# Patient Record
Sex: Female | Born: 1945 | Race: White | Hispanic: No | Marital: Married | Smoking: Former smoker
Health system: Southern US, Community
[De-identification: ages and names within clinical notes are randomized; demographics above are authoritative.]

## PROBLEM LIST (undated history)

## (undated) DIAGNOSIS — F419 Anxiety disorder, unspecified: Secondary | ICD-10-CM

## (undated) DIAGNOSIS — J449 Chronic obstructive pulmonary disease, unspecified: Secondary | ICD-10-CM

## (undated) DIAGNOSIS — R0600 Dyspnea, unspecified: Secondary | ICD-10-CM

## (undated) DIAGNOSIS — I10 Essential (primary) hypertension: Secondary | ICD-10-CM

## (undated) DIAGNOSIS — M1990 Unspecified osteoarthritis, unspecified site: Secondary | ICD-10-CM

## (undated) DIAGNOSIS — Z87442 Personal history of urinary calculi: Secondary | ICD-10-CM

## (undated) DIAGNOSIS — I499 Cardiac arrhythmia, unspecified: Secondary | ICD-10-CM

## (undated) DIAGNOSIS — J45909 Unspecified asthma, uncomplicated: Secondary | ICD-10-CM

## (undated) DIAGNOSIS — Z9889 Other specified postprocedural states: Secondary | ICD-10-CM

## (undated) DIAGNOSIS — E785 Hyperlipidemia, unspecified: Secondary | ICD-10-CM

## (undated) DIAGNOSIS — J189 Pneumonia, unspecified organism: Secondary | ICD-10-CM

## (undated) DIAGNOSIS — R112 Nausea with vomiting, unspecified: Secondary | ICD-10-CM

## (undated) DIAGNOSIS — C3491 Malignant neoplasm of unspecified part of right bronchus or lung: Principal | ICD-10-CM

## (undated) DIAGNOSIS — G2581 Restless legs syndrome: Secondary | ICD-10-CM

## (undated) DIAGNOSIS — I82409 Acute embolism and thrombosis of unspecified deep veins of unspecified lower extremity: Secondary | ICD-10-CM

## (undated) HISTORY — PX: CARDIAC CATHETERIZATION: SHX172

## (undated) HISTORY — PX: CYST EXCISION: SHX5701

## (undated) HISTORY — PX: TONSILLECTOMY: SUR1361

## (undated) HISTORY — DX: Malignant neoplasm of unspecified part of right bronchus or lung: C34.91

## (undated) HISTORY — PX: DENTAL EXAMINATION UNDER ANESTHESIA: SHX1447

## (undated) HISTORY — PX: ABDOMINAL HYSTERECTOMY: SHX81

## (undated) HISTORY — PX: CYSTO: SHX6284

## (undated) HISTORY — PX: EYE SURGERY: SHX253

---

## 1999-07-22 ENCOUNTER — Emergency Department (HOSPITAL_COMMUNITY): Admission: EM | Admit: 1999-07-22 | Discharge: 1999-07-22 | Payer: Self-pay | Admitting: Emergency Medicine

## 1999-07-22 ENCOUNTER — Encounter: Payer: Self-pay | Admitting: Emergency Medicine

## 1999-07-24 ENCOUNTER — Inpatient Hospital Stay (HOSPITAL_COMMUNITY): Admission: RE | Admit: 1999-07-24 | Discharge: 1999-07-26 | Payer: Self-pay | Admitting: Urology

## 1999-07-24 ENCOUNTER — Encounter: Payer: Self-pay | Admitting: Urology

## 1999-07-25 ENCOUNTER — Encounter: Payer: Self-pay | Admitting: Urology

## 1999-08-01 ENCOUNTER — Encounter: Payer: Self-pay | Admitting: Urology

## 1999-08-01 ENCOUNTER — Ambulatory Visit (HOSPITAL_COMMUNITY): Admission: RE | Admit: 1999-08-01 | Discharge: 1999-08-01 | Payer: Self-pay | Admitting: Urology

## 2000-02-22 ENCOUNTER — Other Ambulatory Visit: Admission: RE | Admit: 2000-02-22 | Discharge: 2000-02-22 | Payer: Self-pay | Admitting: Obstetrics and Gynecology

## 2000-02-26 ENCOUNTER — Ambulatory Visit (HOSPITAL_COMMUNITY): Admission: RE | Admit: 2000-02-26 | Discharge: 2000-02-26 | Payer: Self-pay | Admitting: Gastroenterology

## 2000-06-10 ENCOUNTER — Ambulatory Visit (HOSPITAL_COMMUNITY): Admission: RE | Admit: 2000-06-10 | Discharge: 2000-06-10 | Payer: Self-pay

## 2001-02-25 ENCOUNTER — Other Ambulatory Visit: Admission: RE | Admit: 2001-02-25 | Discharge: 2001-02-25 | Payer: Self-pay | Admitting: Obstetrics and Gynecology

## 2002-02-25 ENCOUNTER — Other Ambulatory Visit: Admission: RE | Admit: 2002-02-25 | Discharge: 2002-02-25 | Payer: Self-pay | Admitting: Obstetrics and Gynecology

## 2003-04-06 ENCOUNTER — Other Ambulatory Visit: Admission: RE | Admit: 2003-04-06 | Discharge: 2003-04-06 | Payer: Self-pay | Admitting: Obstetrics and Gynecology

## 2005-01-08 ENCOUNTER — Observation Stay (HOSPITAL_COMMUNITY): Admission: EM | Admit: 2005-01-08 | Discharge: 2005-01-09 | Payer: Self-pay | Admitting: Emergency Medicine

## 2011-11-28 DIAGNOSIS — M545 Low back pain: Secondary | ICD-10-CM | POA: Diagnosis not present

## 2011-11-28 DIAGNOSIS — J45909 Unspecified asthma, uncomplicated: Secondary | ICD-10-CM | POA: Diagnosis not present

## 2011-11-28 DIAGNOSIS — IMO0001 Reserved for inherently not codable concepts without codable children: Secondary | ICD-10-CM | POA: Diagnosis not present

## 2011-11-28 DIAGNOSIS — E785 Hyperlipidemia, unspecified: Secondary | ICD-10-CM | POA: Diagnosis not present

## 2011-11-28 DIAGNOSIS — M1990 Unspecified osteoarthritis, unspecified site: Secondary | ICD-10-CM | POA: Diagnosis not present

## 2011-11-28 DIAGNOSIS — M999 Biomechanical lesion, unspecified: Secondary | ICD-10-CM | POA: Diagnosis not present

## 2011-11-28 DIAGNOSIS — I10 Essential (primary) hypertension: Secondary | ICD-10-CM | POA: Diagnosis not present

## 2011-11-29 DIAGNOSIS — M545 Low back pain: Secondary | ICD-10-CM | POA: Diagnosis not present

## 2011-11-29 DIAGNOSIS — M999 Biomechanical lesion, unspecified: Secondary | ICD-10-CM | POA: Diagnosis not present

## 2011-11-29 DIAGNOSIS — IMO0001 Reserved for inherently not codable concepts without codable children: Secondary | ICD-10-CM | POA: Diagnosis not present

## 2011-12-05 DIAGNOSIS — IMO0001 Reserved for inherently not codable concepts without codable children: Secondary | ICD-10-CM | POA: Diagnosis not present

## 2011-12-05 DIAGNOSIS — M545 Low back pain: Secondary | ICD-10-CM | POA: Diagnosis not present

## 2011-12-05 DIAGNOSIS — M999 Biomechanical lesion, unspecified: Secondary | ICD-10-CM | POA: Diagnosis not present

## 2011-12-17 DIAGNOSIS — M545 Low back pain: Secondary | ICD-10-CM | POA: Diagnosis not present

## 2011-12-17 DIAGNOSIS — IMO0001 Reserved for inherently not codable concepts without codable children: Secondary | ICD-10-CM | POA: Diagnosis not present

## 2011-12-17 DIAGNOSIS — M999 Biomechanical lesion, unspecified: Secondary | ICD-10-CM | POA: Diagnosis not present

## 2011-12-24 DIAGNOSIS — M545 Low back pain: Secondary | ICD-10-CM | POA: Diagnosis not present

## 2011-12-24 DIAGNOSIS — M999 Biomechanical lesion, unspecified: Secondary | ICD-10-CM | POA: Diagnosis not present

## 2011-12-24 DIAGNOSIS — IMO0001 Reserved for inherently not codable concepts without codable children: Secondary | ICD-10-CM | POA: Diagnosis not present

## 2011-12-31 DIAGNOSIS — M545 Low back pain: Secondary | ICD-10-CM | POA: Diagnosis not present

## 2011-12-31 DIAGNOSIS — IMO0001 Reserved for inherently not codable concepts without codable children: Secondary | ICD-10-CM | POA: Diagnosis not present

## 2011-12-31 DIAGNOSIS — M999 Biomechanical lesion, unspecified: Secondary | ICD-10-CM | POA: Diagnosis not present

## 2012-01-09 DIAGNOSIS — M545 Low back pain: Secondary | ICD-10-CM | POA: Diagnosis not present

## 2012-01-09 DIAGNOSIS — IMO0001 Reserved for inherently not codable concepts without codable children: Secondary | ICD-10-CM | POA: Diagnosis not present

## 2012-01-09 DIAGNOSIS — M999 Biomechanical lesion, unspecified: Secondary | ICD-10-CM | POA: Diagnosis not present

## 2012-01-22 DIAGNOSIS — IMO0001 Reserved for inherently not codable concepts without codable children: Secondary | ICD-10-CM | POA: Diagnosis not present

## 2012-01-22 DIAGNOSIS — M545 Low back pain: Secondary | ICD-10-CM | POA: Diagnosis not present

## 2012-01-22 DIAGNOSIS — M999 Biomechanical lesion, unspecified: Secondary | ICD-10-CM | POA: Diagnosis not present

## 2012-02-05 DIAGNOSIS — M999 Biomechanical lesion, unspecified: Secondary | ICD-10-CM | POA: Diagnosis not present

## 2012-02-05 DIAGNOSIS — IMO0001 Reserved for inherently not codable concepts without codable children: Secondary | ICD-10-CM | POA: Diagnosis not present

## 2012-02-05 DIAGNOSIS — M545 Low back pain: Secondary | ICD-10-CM | POA: Diagnosis not present

## 2012-02-19 DIAGNOSIS — IMO0001 Reserved for inherently not codable concepts without codable children: Secondary | ICD-10-CM | POA: Diagnosis not present

## 2012-02-19 DIAGNOSIS — M545 Low back pain: Secondary | ICD-10-CM | POA: Diagnosis not present

## 2012-02-19 DIAGNOSIS — M999 Biomechanical lesion, unspecified: Secondary | ICD-10-CM | POA: Diagnosis not present

## 2012-03-11 DIAGNOSIS — M545 Low back pain: Secondary | ICD-10-CM | POA: Diagnosis not present

## 2012-03-11 DIAGNOSIS — M999 Biomechanical lesion, unspecified: Secondary | ICD-10-CM | POA: Diagnosis not present

## 2012-03-11 DIAGNOSIS — IMO0001 Reserved for inherently not codable concepts without codable children: Secondary | ICD-10-CM | POA: Diagnosis not present

## 2012-03-25 DIAGNOSIS — IMO0001 Reserved for inherently not codable concepts without codable children: Secondary | ICD-10-CM | POA: Diagnosis not present

## 2012-03-25 DIAGNOSIS — M999 Biomechanical lesion, unspecified: Secondary | ICD-10-CM | POA: Diagnosis not present

## 2012-03-25 DIAGNOSIS — M545 Low back pain: Secondary | ICD-10-CM | POA: Diagnosis not present

## 2012-04-09 DIAGNOSIS — J45909 Unspecified asthma, uncomplicated: Secondary | ICD-10-CM | POA: Diagnosis not present

## 2012-04-09 DIAGNOSIS — J069 Acute upper respiratory infection, unspecified: Secondary | ICD-10-CM | POA: Diagnosis not present

## 2012-04-09 DIAGNOSIS — R05 Cough: Secondary | ICD-10-CM | POA: Diagnosis not present

## 2012-04-09 DIAGNOSIS — I10 Essential (primary) hypertension: Secondary | ICD-10-CM | POA: Diagnosis not present

## 2012-04-15 DIAGNOSIS — M999 Biomechanical lesion, unspecified: Secondary | ICD-10-CM | POA: Diagnosis not present

## 2012-04-15 DIAGNOSIS — M545 Low back pain: Secondary | ICD-10-CM | POA: Diagnosis not present

## 2012-04-15 DIAGNOSIS — IMO0001 Reserved for inherently not codable concepts without codable children: Secondary | ICD-10-CM | POA: Diagnosis not present

## 2012-05-06 DIAGNOSIS — IMO0001 Reserved for inherently not codable concepts without codable children: Secondary | ICD-10-CM | POA: Diagnosis not present

## 2012-05-06 DIAGNOSIS — M545 Low back pain: Secondary | ICD-10-CM | POA: Diagnosis not present

## 2012-05-06 DIAGNOSIS — M999 Biomechanical lesion, unspecified: Secondary | ICD-10-CM | POA: Diagnosis not present

## 2012-05-14 DIAGNOSIS — I10 Essential (primary) hypertension: Secondary | ICD-10-CM | POA: Diagnosis not present

## 2012-05-14 DIAGNOSIS — E785 Hyperlipidemia, unspecified: Secondary | ICD-10-CM | POA: Diagnosis not present

## 2012-05-21 DIAGNOSIS — E785 Hyperlipidemia, unspecified: Secondary | ICD-10-CM | POA: Diagnosis not present

## 2012-05-21 DIAGNOSIS — Z0000 Encounter for general adult medical examination without abnormal findings: Secondary | ICD-10-CM | POA: Diagnosis not present

## 2012-05-21 DIAGNOSIS — Z23 Encounter for immunization: Secondary | ICD-10-CM | POA: Diagnosis not present

## 2012-05-21 DIAGNOSIS — J45909 Unspecified asthma, uncomplicated: Secondary | ICD-10-CM | POA: Diagnosis not present

## 2012-05-21 DIAGNOSIS — I10 Essential (primary) hypertension: Secondary | ICD-10-CM | POA: Diagnosis not present

## 2012-05-22 DIAGNOSIS — Z1212 Encounter for screening for malignant neoplasm of rectum: Secondary | ICD-10-CM | POA: Diagnosis not present

## 2012-06-10 DIAGNOSIS — K5730 Diverticulosis of large intestine without perforation or abscess without bleeding: Secondary | ICD-10-CM | POA: Diagnosis not present

## 2012-06-10 DIAGNOSIS — Z86010 Personal history of colonic polyps: Secondary | ICD-10-CM | POA: Diagnosis not present

## 2012-06-10 DIAGNOSIS — K921 Melena: Secondary | ICD-10-CM | POA: Diagnosis not present

## 2012-07-03 DIAGNOSIS — Z23 Encounter for immunization: Secondary | ICD-10-CM | POA: Diagnosis not present

## 2012-07-16 DIAGNOSIS — Z1231 Encounter for screening mammogram for malignant neoplasm of breast: Secondary | ICD-10-CM | POA: Diagnosis not present

## 2012-07-28 DIAGNOSIS — M1610 Unilateral primary osteoarthritis, unspecified hip: Secondary | ICD-10-CM | POA: Diagnosis not present

## 2012-07-31 DIAGNOSIS — M1610 Unilateral primary osteoarthritis, unspecified hip: Secondary | ICD-10-CM | POA: Diagnosis not present

## 2012-08-29 DIAGNOSIS — I10 Essential (primary) hypertension: Secondary | ICD-10-CM | POA: Diagnosis not present

## 2012-08-29 DIAGNOSIS — L03019 Cellulitis of unspecified finger: Secondary | ICD-10-CM | POA: Diagnosis not present

## 2012-09-04 DIAGNOSIS — IMO0002 Reserved for concepts with insufficient information to code with codable children: Secondary | ICD-10-CM | POA: Diagnosis not present

## 2012-09-04 DIAGNOSIS — L608 Other nail disorders: Secondary | ICD-10-CM | POA: Diagnosis not present

## 2012-09-09 DIAGNOSIS — M169 Osteoarthritis of hip, unspecified: Secondary | ICD-10-CM | POA: Diagnosis not present

## 2012-09-09 DIAGNOSIS — M25559 Pain in unspecified hip: Secondary | ICD-10-CM | POA: Diagnosis not present

## 2012-09-30 DIAGNOSIS — K921 Melena: Secondary | ICD-10-CM | POA: Diagnosis not present

## 2012-09-30 DIAGNOSIS — K5730 Diverticulosis of large intestine without perforation or abscess without bleeding: Secondary | ICD-10-CM | POA: Diagnosis not present

## 2012-10-14 ENCOUNTER — Other Ambulatory Visit (HOSPITAL_COMMUNITY): Payer: Self-pay | Admitting: Orthopaedic Surgery

## 2012-10-22 ENCOUNTER — Encounter (HOSPITAL_COMMUNITY): Payer: Self-pay | Admitting: Pharmacy Technician

## 2012-10-27 ENCOUNTER — Encounter (HOSPITAL_COMMUNITY)
Admission: RE | Admit: 2012-10-27 | Discharge: 2012-10-27 | Disposition: A | Discharge status: Home / Self Care | Payer: Medicare Other | Source: Ambulatory Visit | Attending: Orthopaedic Surgery | Admitting: Orthopaedic Surgery

## 2012-10-27 ENCOUNTER — Encounter (HOSPITAL_COMMUNITY): Payer: Self-pay

## 2012-10-27 HISTORY — DX: Other specified postprocedural states: Z98.890

## 2012-10-27 HISTORY — DX: Unspecified asthma, uncomplicated: J45.909

## 2012-10-27 HISTORY — DX: Unspecified osteoarthritis, unspecified site: M19.90

## 2012-10-27 HISTORY — DX: Hyperlipidemia, unspecified: E78.5

## 2012-10-27 HISTORY — DX: Essential (primary) hypertension: I10

## 2012-10-27 HISTORY — DX: Nausea with vomiting, unspecified: R11.2

## 2012-10-27 LAB — URINALYSIS, ROUTINE W REFLEX MICROSCOPIC
Bilirubin Urine: NEGATIVE
Glucose, UA: NEGATIVE mg/dL
Leukocytes, UA: NEGATIVE
Protein, ur: NEGATIVE mg/dL
pH: 7 (ref 5.0–8.0)

## 2012-10-27 LAB — CBC
MCV: 89.9 fL (ref 78.0–100.0)
Platelets: 264 10*3/uL (ref 150–400)
RDW: 13.1 % (ref 11.5–15.5)
WBC: 8.1 10*3/uL (ref 4.0–10.5)

## 2012-10-27 LAB — BASIC METABOLIC PANEL
BUN: 18 mg/dL (ref 6–23)
Calcium: 9.9 mg/dL (ref 8.4–10.5)
Creatinine, Ser: 0.85 mg/dL (ref 0.50–1.10)
GFR calc Af Amer: 80 mL/min — ABNORMAL LOW (ref >90)
GFR calc non Af Amer: 69 mL/min — ABNORMAL LOW (ref >90)
Glucose, Bld: 90 mg/dL (ref 70–99)

## 2012-10-27 LAB — PROTIME-INR: Prothrombin Time: 12.6 seconds (ref 11.6–15.2)

## 2012-10-27 LAB — SURGICAL PCR SCREEN: MRSA, PCR: NEGATIVE

## 2012-10-27 NOTE — Patient Instructions (Addendum)
JAONNA WORD  10/27/2012                           YOUR PROCEDURE IS SCHEDULED ON:  10/31/12               PLEASE REPORT TO SHORT STAY CENTER AT :  7:30 AM               CALL THIS NUMBER IF ANY PROBLEMS THE DAY OF SURGERY :               [phone]                      REMEMBER:   Do not eat food or drink liquids AFTER MIDNIGHT   Take these medicines the morning of surgery with A SIP OF WATER:  SIMVASTATIN / DILTIAZEM   Do not wear jewelry, make-up   Do not wear lotions, powders, or perfumes.   Do not shave legs or underarms 12 hrs. before surgery (men may shave face)  Do not bring valuables to the hospital.  Contacts, dentures or bridgework may not be worn into surgery.  Leave suitcase in the car. After surgery it may be brought to your room.  For patients admitted to the hospital more than one night, checkout time is 11:00                          The day of discharge.   Patients discharged the day of surgery will not be allowed to drive home                             If going home same day of surgery, must have someone stay with you first                           24 hrs at home and arrange for some one to drive you home from hospital.    Special Instructions:   Please read over the following fact sheets that you were given:               1. MRSA  INFORMATION                      2. [facility] PREPARING FOR SURGERY SHEET                                                X_____________________________________________________________________        Failure to follow these instructions may result in cancellation of your surgery

## 2012-10-27 NOTE — Progress Notes (Signed)
PTT faxed to Dr. Maureen Ralphs - confirmation recieved

## 2012-10-31 ENCOUNTER — Inpatient Hospital Stay (HOSPITAL_COMMUNITY): Payer: Medicare Other

## 2012-10-31 ENCOUNTER — Encounter (HOSPITAL_COMMUNITY): Payer: Self-pay | Admitting: *Deleted

## 2012-10-31 ENCOUNTER — Encounter (HOSPITAL_COMMUNITY): Payer: Self-pay | Admitting: Anesthesiology

## 2012-10-31 ENCOUNTER — Encounter (HOSPITAL_COMMUNITY)
Admission: RE | Disposition: A | Discharge status: Home Health | Payer: Self-pay | Source: Ambulatory Visit | Attending: Orthopaedic Surgery

## 2012-10-31 ENCOUNTER — Inpatient Hospital Stay (HOSPITAL_COMMUNITY)
Admission: RE | Admit: 2012-10-31 | Discharge: 2012-11-03 | DRG: 470 | Disposition: A | Discharge status: Home Health | Payer: Medicare Other | Source: Ambulatory Visit | Attending: Orthopaedic Surgery | Admitting: Orthopaedic Surgery

## 2012-10-31 ENCOUNTER — Inpatient Hospital Stay (HOSPITAL_COMMUNITY): Payer: Medicare Other | Admitting: Anesthesiology

## 2012-10-31 DIAGNOSIS — Z471 Aftercare following joint replacement surgery: Secondary | ICD-10-CM | POA: Diagnosis not present

## 2012-10-31 DIAGNOSIS — I10 Essential (primary) hypertension: Secondary | ICD-10-CM | POA: Diagnosis not present

## 2012-10-31 DIAGNOSIS — Z79899 Other long term (current) drug therapy: Secondary | ICD-10-CM

## 2012-10-31 DIAGNOSIS — E876 Hypokalemia: Secondary | ICD-10-CM | POA: Diagnosis not present

## 2012-10-31 DIAGNOSIS — M1610 Unilateral primary osteoarthritis, unspecified hip: Secondary | ICD-10-CM | POA: Diagnosis not present

## 2012-10-31 DIAGNOSIS — M169 Osteoarthritis of hip, unspecified: Secondary | ICD-10-CM | POA: Diagnosis present

## 2012-10-31 DIAGNOSIS — M25559 Pain in unspecified hip: Secondary | ICD-10-CM | POA: Diagnosis not present

## 2012-10-31 DIAGNOSIS — Z96649 Presence of unspecified artificial hip joint: Secondary | ICD-10-CM | POA: Diagnosis not present

## 2012-10-31 DIAGNOSIS — E785 Hyperlipidemia, unspecified: Secondary | ICD-10-CM | POA: Diagnosis not present

## 2012-10-31 DIAGNOSIS — F17200 Nicotine dependence, unspecified, uncomplicated: Secondary | ICD-10-CM | POA: Diagnosis present

## 2012-10-31 DIAGNOSIS — J45909 Unspecified asthma, uncomplicated: Secondary | ICD-10-CM | POA: Diagnosis present

## 2012-10-31 DIAGNOSIS — Z01812 Encounter for preprocedural laboratory examination: Secondary | ICD-10-CM

## 2012-10-31 HISTORY — PX: TOTAL HIP ARTHROPLASTY: SHX124

## 2012-10-31 LAB — TYPE AND SCREEN
ABO/RH(D): A POS
Antibody Screen: NEGATIVE

## 2012-10-31 SURGERY — ARTHROPLASTY, HIP, TOTAL, ANTERIOR APPROACH
Anesthesia: General | Site: Hip | Laterality: Right | Wound class: Clean

## 2012-10-31 MED ORDER — SODIUM CHLORIDE 0.9 % IV SOLN
INTRAVENOUS | Status: DC | PRN
  Administered 2012-10-31: 1000 mL via INTRAMUSCULAR

## 2012-10-31 MED ORDER — MENTHOL 3 MG MT LOZG
1.0000 | LOZENGE | OROMUCOSAL | Status: DC | PRN
  Filled 2012-10-31: qty 9

## 2012-10-31 MED ORDER — CEFAZOLIN SODIUM-DEXTROSE 2-3 GM-% IV SOLR
2.0000 g | INTRAVENOUS | Status: AC
  Administered 2012-10-31: 2 g via INTRAVENOUS

## 2012-10-31 MED ORDER — METHOCARBAMOL 100 MG/ML IJ SOLN
500.0000 mg | Freq: Four times a day (QID) | INTRAVENOUS | Status: DC | PRN
  Administered 2012-10-31: 500 mg via INTRAVENOUS
  Filled 2012-10-31: qty 5

## 2012-10-31 MED ORDER — FLUTICASONE PROPIONATE 50 MCG/ACT NA SUSP
2.0000 | Freq: Every day | NASAL | Status: DC | PRN
  Filled 2012-10-31: qty 16

## 2012-10-31 MED ORDER — STERILE WATER FOR IRRIGATION IR SOLN
Status: DC | PRN
  Administered 2012-10-31: 3000 mL

## 2012-10-31 MED ORDER — ALUM & MAG HYDROXIDE-SIMETH 200-200-20 MG/5ML PO SUSP: 30.0000 mL | ORAL | Status: DC | PRN

## 2012-10-31 MED ORDER — ROCURONIUM BROMIDE 100 MG/10ML IV SOLN
INTRAVENOUS | Status: DC | PRN
  Administered 2012-10-31: 35 mg via INTRAVENOUS

## 2012-10-31 MED ORDER — ZOLPIDEM TARTRATE 5 MG PO TABS: 5.0000 mg | ORAL_TABLET | Freq: Every evening | ORAL | Status: DC | PRN

## 2012-10-31 MED ORDER — KETOROLAC TROMETHAMINE 15 MG/ML IJ SOLN
7.5000 mg | Freq: Four times a day (QID) | INTRAMUSCULAR | Status: AC
  Administered 2012-10-31 (×2): 7.5 mg via INTRAVENOUS
  Filled 2012-10-31 (×2): qty 1

## 2012-10-31 MED ORDER — DILTIAZEM HCL ER BEADS 300 MG PO CP24
300.0000 mg | ORAL_CAPSULE | Freq: Every day | ORAL | Status: DC
  Administered 2012-11-02: 300 mg via ORAL
  Filled 2012-10-31 (×3): qty 1

## 2012-10-31 MED ORDER — ASPIRIN EC 325 MG PO TBEC
325.0000 mg | DELAYED_RELEASE_TABLET | Freq: Two times a day (BID) | ORAL | Status: DC
  Administered 2012-11-01 – 2012-11-03 (×5): 325 mg via ORAL
  Filled 2012-10-31 (×8): qty 1

## 2012-10-31 MED ORDER — ONDANSETRON HCL 4 MG/2ML IJ SOLN
INTRAMUSCULAR | Status: DC | PRN
  Administered 2012-10-31: 4 mg via INTRAVENOUS

## 2012-10-31 MED ORDER — ONDANSETRON HCL 4 MG/2ML IJ SOLN: 4.0000 mg | Freq: Four times a day (QID) | INTRAMUSCULAR | Status: DC | PRN

## 2012-10-31 MED ORDER — SIMVASTATIN 20 MG PO TABS
20.0000 mg | ORAL_TABLET | Freq: Every day | ORAL | Status: DC
  Administered 2012-11-01 – 2012-11-03 (×3): 20 mg via ORAL
  Filled 2012-10-31 (×3): qty 1

## 2012-10-31 MED ORDER — HYDROMORPHONE HCL PF 1 MG/ML IJ SOLN
1.0000 mg | INTRAMUSCULAR | Status: DC | PRN
  Administered 2012-11-01: 1 mg via INTRAVENOUS
  Filled 2012-10-31: qty 1

## 2012-10-31 MED ORDER — GLYCOPYRROLATE 0.2 MG/ML IJ SOLN
INTRAMUSCULAR | Status: DC | PRN
  Administered 2012-10-31: .3 mg via INTRAVENOUS

## 2012-10-31 MED ORDER — CEFAZOLIN SODIUM 1-5 GM-% IV SOLN
1.0000 g | Freq: Four times a day (QID) | INTRAVENOUS | Status: AC
  Administered 2012-10-31 (×2): 1 g via INTRAVENOUS
  Filled 2012-10-31 (×2): qty 50

## 2012-10-31 MED ORDER — METOCLOPRAMIDE HCL 10 MG PO TABS: 5.0000 mg | ORAL_TABLET | Freq: Three times a day (TID) | ORAL | Status: DC | PRN

## 2012-10-31 MED ORDER — EPHEDRINE SULFATE 50 MG/ML IJ SOLN
INTRAMUSCULAR | Status: DC | PRN
  Administered 2012-10-31: 10 mg via INTRAVENOUS

## 2012-10-31 MED ORDER — LIDOCAINE HCL (CARDIAC) 20 MG/ML IV SOLN
INTRAVENOUS | Status: DC | PRN
  Administered 2012-10-31: 60 mg via INTRAVENOUS

## 2012-10-31 MED ORDER — ONDANSETRON HCL 4 MG PO TABS: 4.0000 mg | ORAL_TABLET | Freq: Four times a day (QID) | ORAL | Status: DC | PRN

## 2012-10-31 MED ORDER — PROPOFOL 10 MG/ML IV BOLUS
INTRAVENOUS | Status: DC | PRN
  Administered 2012-10-31: 110 mg via INTRAVENOUS

## 2012-10-31 MED ORDER — MEPERIDINE HCL 50 MG/ML IJ SOLN: 6.2500 mg | INTRAMUSCULAR | Status: DC | PRN

## 2012-10-31 MED ORDER — METHOCARBAMOL 500 MG PO TABS
500.0000 mg | ORAL_TABLET | Freq: Four times a day (QID) | ORAL | Status: DC | PRN
  Administered 2012-11-02: 500 mg via ORAL
  Filled 2012-10-31: qty 1

## 2012-10-31 MED ORDER — OXYCODONE HCL 5 MG PO TABS
5.0000 mg | ORAL_TABLET | ORAL | Status: DC | PRN
  Administered 2012-11-01 – 2012-11-02 (×3): 10 mg via ORAL
  Filled 2012-10-31 (×4): qty 2

## 2012-10-31 MED ORDER — OXYCODONE HCL ER 10 MG PO T12A
10.0000 mg | EXTENDED_RELEASE_TABLET | Freq: Two times a day (BID) | ORAL | Status: DC
  Administered 2012-10-31 – 2012-11-02 (×6): 10 mg via ORAL
  Filled 2012-10-31 (×6): qty 1

## 2012-10-31 MED ORDER — ACETAMINOPHEN 325 MG PO TABS
650.0000 mg | ORAL_TABLET | Freq: Four times a day (QID) | ORAL | Status: DC | PRN
  Administered 2012-11-02 – 2012-11-03 (×2): 650 mg via ORAL
  Filled 2012-10-31: qty 2

## 2012-10-31 MED ORDER — 0.9 % SODIUM CHLORIDE (POUR BTL) OPTIME
TOPICAL | Status: DC | PRN
  Administered 2012-10-31: 1000 mL

## 2012-10-31 MED ORDER — IRBESARTAN 150 MG PO TABS
150.0000 mg | ORAL_TABLET | Freq: Every day | ORAL | Status: DC
  Administered 2012-10-31 – 2012-11-02 (×2): 150 mg via ORAL
  Filled 2012-10-31 (×4): qty 1

## 2012-10-31 MED ORDER — DOCUSATE SODIUM 100 MG PO CAPS
100.0000 mg | ORAL_CAPSULE | Freq: Two times a day (BID) | ORAL | Status: DC
  Administered 2012-10-31 – 2012-11-03 (×7): 100 mg via ORAL

## 2012-10-31 MED ORDER — HYDROMORPHONE HCL PF 1 MG/ML IJ SOLN
0.2500 mg | INTRAMUSCULAR | Status: DC | PRN
  Administered 2012-10-31: 0.5 mg via INTRAVENOUS

## 2012-10-31 MED ORDER — ACETAMINOPHEN 650 MG RE SUPP: 650.0000 mg | Freq: Four times a day (QID) | RECTAL | Status: DC | PRN

## 2012-10-31 MED ORDER — SODIUM CHLORIDE 0.9 % IV SOLN
INTRAVENOUS | Status: DC
  Administered 2012-10-31 – 2012-11-01 (×2): via INTRAVENOUS

## 2012-10-31 MED ORDER — SCOPOLAMINE 1 MG/3DAYS TD PT72
1.0000 | MEDICATED_PATCH | TRANSDERMAL | Status: DC
  Administered 2012-10-31: 1.5 mg via TRANSDERMAL
  Filled 2012-10-31: qty 1

## 2012-10-31 MED ORDER — PROMETHAZINE HCL 25 MG/ML IJ SOLN: 6.2500 mg | INTRAMUSCULAR | Status: DC | PRN

## 2012-10-31 MED ORDER — LACTATED RINGERS IV SOLN
INTRAVENOUS | Status: DC
  Administered 2012-10-31: 1000 mL via INTRAVENOUS

## 2012-10-31 MED ORDER — ALBUTEROL SULFATE HFA 108 (90 BASE) MCG/ACT IN AERS: 2.0000 | INHALATION_SPRAY | Freq: Four times a day (QID) | RESPIRATORY_TRACT | Status: DC | PRN

## 2012-10-31 MED ORDER — TRIAMTERENE-HCTZ 37.5-25 MG PO TABS
1.0000 | ORAL_TABLET | Freq: Every day | ORAL | Status: DC
  Administered 2012-10-31 – 2012-11-02 (×2): 1 via ORAL
  Filled 2012-10-31 (×4): qty 1

## 2012-10-31 MED ORDER — DIPHENHYDRAMINE HCL 12.5 MG/5ML PO ELIX: 12.5000 mg | ORAL_SOLUTION | ORAL | Status: DC | PRN

## 2012-10-31 MED ORDER — OXYCODONE HCL 5 MG/5ML PO SOLN: 5.0000 mg | Freq: Once | ORAL | Status: DC | PRN

## 2012-10-31 MED ORDER — FENTANYL CITRATE 0.05 MG/ML IJ SOLN
INTRAMUSCULAR | Status: DC | PRN
  Administered 2012-10-31 (×2): 50 ug via INTRAVENOUS
  Administered 2012-10-31: 100 ug via INTRAVENOUS
  Administered 2012-10-31: 50 ug via INTRAVENOUS

## 2012-10-31 MED ORDER — OXYCODONE HCL 5 MG PO TABS: 5.0000 mg | ORAL_TABLET | Freq: Once | ORAL | Status: DC | PRN

## 2012-10-31 MED ORDER — FERROUS SULFATE 325 (65 FE) MG PO TABS
325.0000 mg | ORAL_TABLET | Freq: Three times a day (TID) | ORAL | Status: DC
  Administered 2012-10-31 – 2012-11-03 (×8): 325 mg via ORAL
  Filled 2012-10-31 (×12): qty 1

## 2012-10-31 MED ORDER — MIDAZOLAM HCL 5 MG/5ML IJ SOLN
INTRAMUSCULAR | Status: DC | PRN
  Administered 2012-10-31: 1 mg via INTRAVENOUS

## 2012-10-31 MED ORDER — ACETAMINOPHEN 10 MG/ML IV SOLN: 1000.0000 mg | Freq: Once | INTRAVENOUS | Status: DC | PRN

## 2012-10-31 MED ORDER — METOCLOPRAMIDE HCL 5 MG/ML IJ SOLN: 5.0000 mg | Freq: Three times a day (TID) | INTRAMUSCULAR | Status: DC | PRN

## 2012-10-31 MED ORDER — PHENOL 1.4 % MT LIQD: 1.0000 | OROMUCOSAL | Status: DC | PRN

## 2012-10-31 MED ORDER — NEOSTIGMINE METHYLSULFATE 1 MG/ML IJ SOLN
INTRAMUSCULAR | Status: DC | PRN
  Administered 2012-10-31: 2 mg via INTRAVENOUS

## 2012-10-31 SURGICAL SUPPLY — 40 items
ADH SKN CLS APL DERMABOND .7 (GAUZE/BANDAGES/DRESSINGS) ×1
BAG SPEC THK2 15X12 ZIP CLS (MISCELLANEOUS) ×2
BAG ZIPLOCK 12X15 (MISCELLANEOUS) ×4 IMPLANT
BLADE SAW SGTL 18X1.27X75 (BLADE) ×2 IMPLANT
CELLS DAT CNTRL 66122 CELL SVR (MISCELLANEOUS) ×1 IMPLANT
CLOTH BEACON ORANGE TIMEOUT ST (SAFETY) ×2 IMPLANT
DERMABOND ADVANCED (GAUZE/BANDAGES/DRESSINGS) ×1
DERMABOND ADVANCED .7 DNX12 (GAUZE/BANDAGES/DRESSINGS) ×1 IMPLANT
DRAPE C-ARM 42X72 X-RAY (DRAPES) ×2 IMPLANT
DRAPE STERI IOBAN 125X83 (DRAPES) ×2 IMPLANT
DRAPE U-SHAPE 47X51 STRL (DRAPES) ×6 IMPLANT
DRSG AQUACEL AG ADV 3.5X10 (GAUZE/BANDAGES/DRESSINGS) ×2 IMPLANT
DURAPREP 26ML APPLICATOR (WOUND CARE) ×2 IMPLANT
ELECT BLADE TIP CTD 4 INCH (ELECTRODE) ×2 IMPLANT
ELECT REM PT RETURN 9FT ADLT (ELECTROSURGICAL) ×2
ELECTRODE REM PT RTRN 9FT ADLT (ELECTROSURGICAL) ×1 IMPLANT
FACESHIELD LNG OPTICON STERILE (SAFETY) ×8 IMPLANT
GLOVE BIO SURGEON STRL SZ7 (GLOVE) ×2 IMPLANT
GLOVE BIO SURGEON STRL SZ7.5 (GLOVE) ×2 IMPLANT
GLOVE BIOGEL PI IND STRL 7.5 (GLOVE) IMPLANT
GLOVE BIOGEL PI IND STRL 8 (GLOVE) ×1 IMPLANT
GLOVE BIOGEL PI INDICATOR 7.5 (GLOVE)
GLOVE BIOGEL PI INDICATOR 8 (GLOVE) ×1
GLOVE ECLIPSE 7.0 STRL STRAW (GLOVE) ×2 IMPLANT
GOWN STRL REIN XL XLG (GOWN DISPOSABLE) ×4 IMPLANT
HANDPIECE INTERPULSE COAX TIP (DISPOSABLE) ×2
KIT BASIN OR (CUSTOM PROCEDURE TRAY) ×2 IMPLANT
PACK TOTAL JOINT (CUSTOM PROCEDURE TRAY) ×2 IMPLANT
PADDING CAST COTTON 6X4 STRL (CAST SUPPLIES) ×2 IMPLANT
RTRCTR WOUND ALEXIS 18CM MED (MISCELLANEOUS) ×2
SET HNDPC FAN SPRY TIP SCT (DISPOSABLE) ×1 IMPLANT
SUT ETHIBOND NAB CT1 #1 30IN (SUTURE) ×4 IMPLANT
SUT MNCRL AB 4-0 PS2 18 (SUTURE) ×2 IMPLANT
SUT VIC AB 1 CT1 36 (SUTURE) ×4 IMPLANT
SUT VIC AB 2-0 CT1 27 (SUTURE) ×4
SUT VIC AB 2-0 CT1 TAPERPNT 27 (SUTURE) ×2 IMPLANT
SUT VLOC 180 0 24IN GS25 (SUTURE) ×2 IMPLANT
TOWEL OR 17X26 10 PK STRL BLUE (TOWEL DISPOSABLE) ×4 IMPLANT
TOWEL OR NON WOVEN STRL DISP B (DISPOSABLE) ×2 IMPLANT
TRAY FOLEY CATH 14FRSI W/METER (CATHETERS) ×2 IMPLANT

## 2012-10-31 NOTE — Brief Op Note (Signed)
10/31/2012  11:40 AM  PATIENT:  [name]  67 y.o. female  PRE-OPERATIVE DIAGNOSIS:  Severe osteoarthritis right hip  POST-OPERATIVE DIAGNOSIS:  Severe osteoarthritis right hi  PROCEDURE:  Procedure(s) (LRB) with comments: TOTAL HIP ARTHROPLASTY ANTERIOR APPROACH (Right) - Right Total Hip Replacement  SURGEON:  Surgeon(s) and Role:    * Kathryne Hitch, MD - Primary  PHYSICIAN ASSISTANT:   ASSISTANTS: none   ANESTHESIA:   general  EBL:  Total I/O In: 1400 [I.V.:1400] Out: 475 [Urine:225; Blood:250]  BLOOD ADMINISTERED:none  DRAINS: none   LOCAL MEDICATIONS USED:  NONE  SPECIMEN:  No Specimen  DISPOSITION OF SPECIMEN:  N/A  COUNTS:  YES  TOURNIQUET:  * No tourniquets in log *  DICTATION: .Other Dictation: Dictation Number [id number]  PLAN OF CARE: Admit to inpatient   PATIENT DISPOSITION:  PACU - hemodynamically stable.   Delay start of Pharmacological VTE agent (>24hrs) due to surgical blood loss or risk of bleeding: no

## 2012-10-31 NOTE — Transfer of Care (Signed)
Immediate Anesthesia Transfer of Care Note  Patient: [name]  Procedure(s) Performed: Procedure(s) (LRB) with comments: TOTAL HIP ARTHROPLASTY ANTERIOR APPROACH (Right) - Right Total Hip Replacement  Patient Location: PACU  Anesthesia Type:General  Level of Consciousness: awake, alert  and oriented  Airway & Oxygen Therapy: Patient Spontanous Breathing and Patient connected to face mask oxygen  Post-op Assessment: Report given to PACU RN and Post -op Vital signs reviewed and stable  Post vital signs: Reviewed and stable  Complications: No apparent anesthesia complications

## 2012-10-31 NOTE — H&P (Signed)
TOTAL HIP ADMISSION H&P  Patient is admitted for right total hip arthroplasty.  Subjective:  Chief Complaint: right hip pain  HPI: [name], 67 y.o. female, has a history of pain and functional disability in the right hip(s) due to arthritis and patient has failed non-surgical conservative treatments for greater than 12 weeks to include NSAID's and/or analgesics, use of assistive devices, weight reduction as appropriate and activity modification.  Onset of symptoms was gradual starting 2 years ago with gradually worsening course since that time.The patient noted no past surgery on the right hip(s).  Patient currently rates pain in the right hip at 8 out of 10 with activity. Patient has night pain, worsening of pain with activity and weight bearing, pain that interfers with activities of daily living, pain with passive range of motion and crepitus. Patient has evidence of subchondral cysts, subchondral sclerosis, periarticular osteophytes and joint space narrowing by imaging studies. This condition presents safety issues increasing the risk of falls.  There is no current active infection.  Patient Active Problem List   Diagnosis Date Noted  . Degenerative arthritis of hip 10/31/2012   Past Medical History  Diagnosis Date  . PONV (postoperative nausea and vomiting)   . Hypertension   . Hyperlipidemia   . Asthma     asthmatic bronchitis  . Arthritis     Past Surgical History  Procedure Date  . Tonsillectomy   . Dental examination under anesthesia   . Abdominal hysterectomy   . Cyst excision     from back  . Cysto     Prescriptions prior to admission  Medication Sig Dispense Refill  . albuterol (PROVENTIL HFA;VENTOLIN HFA) 108 (90 BASE) MCG/ACT inhaler Inhale 2 puffs into the lungs every 6 (six) hours as needed. For shortness of breath      . aspirin EC 81 MG tablet Take 81 mg by mouth every morning.      . Calcium Carbonate-Vitamin D (CALCIUM 600 + D PO) Take 1 tablet by mouth 2  (two) times daily.      [name] diltiazem (TIAZAC) 300 MG 24 hr capsule Take 300 mg by mouth every morning.      . diphenhydrAMINE (BENADRYL) 25 mg capsule Take 50 mg by mouth every 6 (six) hours as needed. For allergies      . fluticasone (FLONASE) 50 MCG/ACT nasal spray Place 2 sprays into the nose daily as needed. For nasal decongestant      . guaiFENesin (MUCINEX) 600 MG 12 hr tablet Take 600 mg by mouth 2 (two) times daily.      [name] ibuprofen (ADVIL,MOTRIN) 200 MG tablet Take 200 mg by mouth 3 (three) times daily as needed. For hip pain      . simvastatin (ZOCOR) 20 MG tablet Take 20 mg by mouth every morning.      . sodium chloride (OCEAN) 0.65 % nasal spray Place 1 spray into the nose daily as needed. For nasal decongestant      . telmisartan (MICARDIS) 40 MG tablet Take 40 mg by mouth every morning.      . traMADol (ULTRAM) 50 MG tablet Take 50-100 mg by mouth every 8 (eight) hours as needed. For pain      . triamterene-hydrochlorothiazide (MAXZIDE-25) 37.5-25 MG per tablet Take 1 tablet by mouth every morning.      . zolpidem (AMBIEN) 10 MG tablet Take 10 mg by mouth at bedtime as needed. For sleep       No Known Allergies  History  Substance Use Topics  . Smoking status: Current Every Day Smoker -- 0.5 packs/day  . Smokeless tobacco: Not on file  . Alcohol Use: No    No family history on file.   Review of Systems  Musculoskeletal: Positive for joint pain.  All other systems reviewed and are negative.    Objective:  Physical Exam  Constitutional: She is oriented to person, place, and time. She appears well-developed and well-nourished.  HENT:  Head: Normocephalic and atraumatic.  Eyes: EOM are normal. Pupils are equal, round, and reactive to light.  Neck: Normal range of motion. Neck supple.  Cardiovascular: Normal rate and regular rhythm.   Respiratory: Effort normal and breath sounds normal.  GI: Soft. Bowel sounds are normal.  Musculoskeletal:       Right hip: She exhibits  decreased range of motion, decreased strength, bony tenderness and crepitus.  Neurological: She is alert and oriented to person, place, and time.  Skin: Skin is warm and dry.  Psychiatric: She has a normal mood and affect.    Vital signs in last 24 hours: Temp:  [97.7 F (36.5 C)] 97.7 F (36.5 C) (01/17 0721) Pulse Rate:  [77] 77  (01/17 0721) Resp:  [20] 20  (01/17 0721) BP: (121)/(68) 121/68 mmHg (01/17 0721) SpO2:  [98 %] 98 % (01/17 0721)  Labs:   There is no height or weight on file to calculate BMI.   Imaging Review Plain radiographs demonstrate severe degenerative joint disease of the right hip(s). The bone quality appears to be good for age and reported activity level.  Assessment/Plan:  End stage arthritis, right hip(s)  The patient history, physical examination, clinical judgement of the provider and imaging studies are consistent with end stage degenerative joint disease of the right hip(s) and total hip arthroplasty is deemed medically necessary. The treatment options including medical management, injection therapy, arthroscopy and arthroplasty were discussed at length. The risks and benefits of total hip arthroplasty were presented and reviewed. The risks due to aseptic loosening, infection, stiffness, dislocation/subluxation,  thromboembolic complications and other imponderables were discussed.  The patient acknowledged the explanation, agreed to proceed with the plan and consent was signed. Patient is being admitted for inpatient treatment for surgery, pain control, PT, OT, prophylactic antibiotics, VTE prophylaxis, progressive ambulation and ADL's and discharge planning.The patient is planning to be discharged home with home health services

## 2012-10-31 NOTE — Evaluation (Signed)
Physical Therapy Evaluation Patient Details Name: [name] MRN: [id number] DOB: March 18, 1946 Today's Date: 10/31/2012 Time: 0102-7253 PT Time Calculation (min): 33 min  PT Assessment / Plan / Recommendation Clinical Impression  Pt s/p R THR presents with decreased R LE strength/ROM and post op pain limiting functional mobility    PT Assessment  Patient needs continued PT services    Follow Up Recommendations  Home health PT    Does the patient have the potential to tolerate intense rehabilitation      Barriers to Discharge None      Equipment Recommendations  None recommended by PT    Recommendations for Other Services OT consult   Frequency 7X/week    Precautions / Restrictions Precautions Precautions: Fall Restrictions Weight Bearing Restrictions: No Other Position/Activity Restrictions: WBAT   Pertinent Vitals/Pain 4/10; premedicated, ice pack provided      Mobility  Bed Mobility Bed Mobility: Supine to Sit;Sit to Supine Supine to Sit: 4: Min assist Sit to Supine: 4: Min assist Details for Bed Mobility Assistance:  cues for sequence and use of L LE to self assist Transfers Transfers: Sit to Stand;Stand to Sit Sit to Stand: 4: Min assist Stand to Sit: 4: Min assist Details for Transfer Assistance: cues for LE management and use of UEs to self assist Ambulation/Gait Ambulation/Gait Assistance: 4: Min assist Ambulation Distance (Feet): 35 Feet Assistive device: Rolling walker Ambulation/Gait Assistance Details: cues for sequence, posture and position from RW Gait Pattern: Step-to pattern    Shoulder Instructions     Exercises Total Joint Exercises Ankle Circles/Pumps: AROM;10 reps;Supine;Both Quad Sets: AROM;10 reps;Both;Supine Heel Slides: AAROM;15 reps;Supine;Right Hip ABduction/ADduction: AAROM;Right;10 reps;Supine   PT Diagnosis: Difficulty walking  PT Problem List: Decreased strength;Decreased range of motion;Decreased activity  tolerance;Decreased mobility;Pain;Decreased knowledge of use of DME PT Treatment Interventions: DME instruction;Gait training;Stair training;Functional mobility training;Therapeutic activities;Therapeutic exercise;Patient/family education   PT Goals Acute Rehab PT Goals PT Goal Formulation: With patient Time For Goal Achievement: 11/06/12 Potential to Achieve Goals: Good Pt will go Supine/Side to Sit: with supervision PT Goal: Supine/Side to Sit - Progress: Goal set today Pt will go Sit to Supine/Side: with supervision PT Goal: Sit to Supine/Side - Progress: Goal set today Pt will go Sit to Stand: with supervision PT Goal: Sit to Stand - Progress: Goal set today Pt will go Stand to Sit: with supervision PT Goal: Stand to Sit - Progress: Goal set today Pt will Ambulate: 51 - 150 feet;with supervision;with rolling walker PT Goal: Ambulate - Progress: Goal set today Pt will Go Up / Down Stairs: Flight;with min assist;with least restrictive assistive device PT Goal: Up/Down Stairs - Progress: Goal set today  Visit Information  Last PT Received On: 10/31/12 Assistance Needed: +1    Subjective Data  Subjective: I have been so looking forward to this new hip Patient Stated Goal: Resume previous lifestyle with decreased pain   Prior Functioning  Home Living Lives With: Spouse Available Help at Discharge: Family Type of Home: House Home Access: Stairs to enter Secretary/administrator of Steps: 2 Entrance Stairs-Rails: None Home Layout: Two level Alternate Level Stairs-Number of Steps: 14 Alternate Level Stairs-Rails: Right Home Adaptive Equipment: Walker - rolling;Straight cane Prior Function Level of Independence: Independent Able to Take Stairs?: Yes Driving: Yes Vocation: Retired Musician: No difficulties Dominant Hand: Right    Cognition  Overall Cognitive Status: Appears within functional limits for tasks assessed/performed Arousal/Alertness:  Awake/alert Orientation Level: Appears intact for tasks assessed Behavior During Session: [facility] for tasks  performed    Extremity/Trunk Assessment Right Upper Extremity Assessment RUE ROM/Strength/Tone: WFL for tasks assessed Left Upper Extremity Assessment LUE ROM/Strength/Tone: WFL for tasks assessed Right Lower Extremity Assessment RLE ROM/Strength/Tone: Deficits RLE ROM/Strength/Tone Deficits: hip strength 2+/5 with AAROM to 85 flex and 20 abd Left Lower Extremity Assessment LLE ROM/Strength/Tone: WFL for tasks assessed   Balance    End of Session PT - End of Session Equipment Utilized During Treatment: Gait belt Activity Tolerance: Patient tolerated treatment well Patient left: in bed;with call bell/phone within reach;with family/visitor present;with nursing in room Nurse Communication: Mobility status  GP     [name] 10/31/2012, 5:52 PM

## 2012-10-31 NOTE — Progress Notes (Signed)
Utilization review completed.  

## 2012-10-31 NOTE — Anesthesia Preprocedure Evaluation (Signed)
Anesthesia Evaluation  Patient identified by MRN, date of birth, ID band Patient awake    Reviewed: Allergy & Precautions, H&P , NPO status , Patient's Chart, lab work & pertinent test results  History of Anesthesia Complications (+) PONV  Airway Mallampati: II TM Distance: >3 FB Neck ROM: Full    Dental  (+) Teeth Intact, Dental Advisory Given and Partial Upper   Pulmonary asthma ,  breath sounds clear to auscultation  Pulmonary exam normal       Cardiovascular hypertension, Pt. on medications - Past MI and - CHF Rhythm:Regular Rate:Normal     Neuro/Psych negative neurological ROS  negative psych ROS   GI/Hepatic negative GI ROS, Neg liver ROS,   Endo/Other  negative endocrine ROS  Renal/GU negative Renal ROS     Musculoskeletal  (+) Arthritis -, Osteoarthritis,    Abdominal   Peds  Hematology negative hematology ROS (+)   Anesthesia Other Findings   Reproductive/Obstetrics                           Anesthesia Physical Anesthesia Plan  ASA: III  Anesthesia Plan: General   Post-op Pain Management:    Induction: Intravenous  Airway Management Planned: Oral ETT  Additional Equipment:   Intra-op Plan:   Post-operative Plan: Extubation in OR  Informed Consent: I have reviewed the patients History and Physical, chart, labs and discussed the procedure including the risks, benefits and alternatives for the proposed anesthesia with the patient or authorized representative who has indicated his/her understanding and acceptance.   Dental advisory given  Plan Discussed with: CRNA  Anesthesia Plan Comments:         Anesthesia Quick Evaluation

## 2012-10-31 NOTE — Anesthesia Postprocedure Evaluation (Signed)
Anesthesia Post Note  Patient: [name]  Procedure(s) Performed: Procedure(s) (LRB): TOTAL HIP ARTHROPLASTY ANTERIOR APPROACH (Right)  Anesthesia type: General  Patient location: PACU  Post pain: Pain level controlled  Post assessment: Post-op Vital signs reviewed  Last Vitals: BP 108/56  Pulse 75  Temp 36.4 C  Resp 14  SpO2 99%  Post vital signs: Reviewed  Level of consciousness: sedated  Complications: No apparent anesthesia complications

## 2012-10-31 NOTE — Progress Notes (Signed)
Pt has a loose cough. Clear mucus. She finished taking her antibiotics for this 10/20/12

## 2012-11-01 LAB — CBC
Hemoglobin: 10.6 g/dL — ABNORMAL LOW (ref 12.0–15.0)
MCH: 31 pg (ref 26.0–34.0)
MCV: 89.5 fL (ref 78.0–100.0)
RBC: 3.42 MIL/uL — ABNORMAL LOW (ref 3.87–5.11)

## 2012-11-01 LAB — BASIC METABOLIC PANEL
CO2: 30 mEq/L (ref 19–32)
Calcium: 7.8 mg/dL — ABNORMAL LOW (ref 8.4–10.5)
Chloride: 92 mEq/L — ABNORMAL LOW (ref 96–112)
GFR calc non Af Amer: 85 mL/min — ABNORMAL LOW (ref >90)
Glucose, Bld: 101 mg/dL — ABNORMAL HIGH (ref 70–99)
Glucose, Bld: 140 mg/dL — ABNORMAL HIGH (ref 70–99)
Potassium: 3.5 mEq/L (ref 3.5–5.1)
Sodium: 131 mEq/L — ABNORMAL LOW (ref 135–145)
Sodium: 128 mEq/L — ABNORMAL LOW (ref 135–145)

## 2012-11-01 MED ORDER — POTASSIUM CHLORIDE 10 MEQ/100ML IV SOLN
10.0000 meq | INTRAVENOUS | Status: AC
  Administered 2012-11-01 (×3): 10 meq via INTRAVENOUS
  Filled 2012-11-01 (×3): qty 100

## 2012-11-01 MED ORDER — CALCIUM CARBONATE 1250 (500 CA) MG PO TABS
1.0000 | ORAL_TABLET | Freq: Two times a day (BID) | ORAL | Status: DC
  Administered 2012-11-01 – 2012-11-03 (×4): 500 mg via ORAL
  Filled 2012-11-01 (×6): qty 1

## 2012-11-01 NOTE — Op Note (Signed)
[name], [name]                 ACCOUNT NO.:  [id number]  MEDICAL RECORD NO.:  [id number]  LOCATION:  1607                         FACILITY:  [facility]  PHYSICIAN:  Vanita Panda. Magnus Ivan, M.D.DATE OF BIRTH:  [birth year]  DATE OF PROCEDURE:  10/31/2012 DATE OF DISCHARGE:  10/31/2012                              OPERATIVE REPORT   PREOPERATIVE DIAGNOSIS:  Severe end-stage arthritis and degenerative joint disease, right hip.  POSTOPERATIVE DIAGNOSIS:  Severe end-stage arthritis and degenerative joint disease, right hip.  PROCEDURES:  Right total hip arthroplasty through direct anterior approach.  IMPLANTS:  DePuy Sector Gription acetabular component size 50, size 32+ 4 neutral polyethylene liner, size 10 Corail femoral component with standard offset (KA), size 32+ 1 ceramic hip ball.  SURGEON:  Vanita Panda. Magnus Ivan, MD  ANESTHESIA:  General.  ANTIBIOTICS:  2 g IV Ancef.  BLOOD LOSS:  Less than 500 mL.  COMPLICATIONS:  None.  INDICATIONS:  [name] is a 67 year old female, well known to me.  She has end-stage arthritis.  Her right hip x-rays showed complete loss of joint space, subchondral sclerosis and osteophytes and subchondral cyst. Her activities of daily living are quite painful to her.  She has night pain, __________ to her gait now and wishes to proceed with a total hip arthroplasty.  She understands the risks of acute blood loss, anemia, DVT, PE, infection fracture, and she does wish to proceed.  PROCEDURE DESCRIPTION:  After informed consent was obtained, appropriate right hip was marked.  She was brought to the operating room and general anesthesia was obtained while she was on a stretcher.  Foley catheter was placed in both feet, had traction boots applied to that.  She was then placed supine on the OSI Hana fracture table with perineal post in place in both feet.  An inline skeletal traction with no traction applied.  We then assessed the right hip under  direct fluoroscopy and the center of the pelvis.  I then assessed her leg lengths.  We then prepped the right hip with DuraPrep and sterile drapes.  Time-out was called and identified as correct patient, correct right hip.  We then made an incision just inferior and posterior to the anterior superior iliac spine and carried this obliquely down the leg.  We dissected down to the tensor fascia lata and the tensor fascia was divided longitudinally.  I then proceeded with a direct anterior approach to the hip.  A Cobra retractor was placed around the lateral neck and up underneath the rectus femoris, a medial retractor was placed.  The lateral femoral circumflex vessels were cauterized and then I divided the hip capsule in L-shaped format and put the Cobra retractors within the hip capsule.  I then made my femoral neck cut about a fingerbreadth proximal to the lesser trochanter using oscillating saw and completed this on osteotome.  I used a corkscrew guide into the femoral head in its entirety and found a devoid of cartilage.  I then placed the Bent Hohmann around the superior acetabular rim and one lateral.  I cleaned the remnants of debris within the acetabulum and then began reaming from size 42 reamer in 2 mm  increments up to a size 50 with all reamers placed under direct visualization, and the last reamer also placed on direct fluoroscopy, so we could obtain her depth of reaming, our inclination, and anteversion.  Once I was pleased with this, I chose the real Sector Gription acetabular component from DePuy size 50 and placed this into the socket and knocked it in a place under direct visualization and fluoroscopy.  I placed a single screw size 25 and the apex hole eliminator guide.  Attention was then turned to the femur with all traction off and the leg externally rotated to 90 degrees, extended and adducted.  I placed a Mueller retractor medially and Hohmann behind the greater  trochanter.  I released the lateral capsule and then used a box cutting guide at the end of the femoral canal.  I used rongeur to lateralize.  I then began broaching from a size 8 broach up to a size 10, 10 was felt to be stable.  So, I trialed a standard neck and a 32+ 1 hip ball.  We brought the leg back over and up with traction internal rotation, reduced this into the pelvis and her leg lengths were measured to be near equal.  She was stable with internal and external rotation as well.  She had minimal shuck.  I then dislocated the hip and then placed the real Corail femoral component with standard offset, size 10 and the real 32+ 1 ceramic hip ball.  We again reduced this in the acetabulum and was stable.  I used 1000 mL of normal saline solution using pulsatile lavage to lavage the joint.  I closed the joint capsule with interrupted #1 Ethibond suture followed by a running 0 V-Loc suture in the tensor fascia lata, 2-0 Vicryl in the deep and subcutaneous tissue and a 4-0 Monocryl subcuticular stitch, Dermabond, and then Aquacel dressing.  She was taken off the Hana table, awakened, extubated, and taken to recovery room in stable condition.  All final counts were correct.  There were no complications noted.     Vanita Panda. Magnus Ivan, M.D.     CYB/MEDQ  D:  10/31/2012  T:  11/01/2012  Job:  [id number]

## 2012-11-01 NOTE — Progress Notes (Signed)
Critical Value k+ 2.9. Never called by lab. Notified MD [id number]. Returned call at [phone] with orders of 3 runs 10 meq. Information reported to oncoming RN.

## 2012-11-01 NOTE — Evaluation (Signed)
Occupational Therapy Evaluation and Discharge Patient Details Name: [name] MRN: [id number] DOB: 1946/07/23 Today's Date: 11/01/2012 Time: 2841-3244 OT Time Calculation (min): 27 min  OT Assessment / Plan / Recommendation Clinical Impression  This 67 yo female s/p right hip direct anterior approach presents to acute OT wtih all education completed. Will D/C from acute OT.    OT Assessment  Patient does not need any further OT services    Follow Up Recommendations  No OT follow up       Equipment Recommendations  3 in 1 bedside comode          Precautions / Restrictions Precautions Precautions: Fall Restrictions Weight Bearing Restrictions: No Other Position/Activity Restrictions: WBAT   Pertinent Vitals/Pain No c/o pain    ADL  Equipment Used: Rolling walker (3n1) Transfers/Ambulation Related to ADLs: Min guard A for all ADL Comments: Pt's husband will help her with LB ADLs until she can do them herself. Demonstrated to her how to back up and step over a threshold for the shower transfer (used a rolled up blanket to simulate the threshold).         Visit Information  Last OT Received On: 11/01/12 Assistance Needed: +1       Prior Functioning     Home Living Lives With: Spouse Available Help at Discharge: Family;Available 24 hours/day Type of Home: House Home Access: Stairs to enter [employer] of Steps: 2 Entrance Stairs-Rails: None Home Layout: Two level Alternate Level Stairs-Number of Steps: 14 Alternate Level Stairs-Rails: Right Bathroom Shower/Tub: Health visitor: Standard Home Adaptive Equipment: Environmental consultant - rolling;Straight cane Prior Function Level of Independence: Independent Able to Take Stairs?: Yes Driving: Yes Vocation: Retired Musician: No difficulties Dominant Hand: Right            Cognition  Overall Cognitive Status: Appears within functional limits for tasks  assessed/performed Arousal/Alertness: Awake/alert Orientation Level: Appears intact for tasks assessed Behavior During Session: [facility] for tasks performed    Extremity/Trunk Assessment Right Upper Extremity Assessment RUE ROM/Strength/Tone: Within functional levels Left Upper Extremity Assessment LUE ROM/Strength/Tone: Within functional levels     Mobility Transfers Transfers: Sit to Stand;Stand to Sit Sit to Stand: 4: Min guard;With upper extremity assist;With armrests;From chair/3-in-1 Stand to Sit: 4: Min guard;With upper extremity assist;With armrests;To chair/3-in-1              End of Session OT - End of Session Equipment Utilized During Treatment:  (RW) Activity Tolerance: Patient tolerated treatment well Patient left: in chair;with call bell/phone within reach       Evette Georges [phone] 11/01/2012, 12:23 PM

## 2012-11-01 NOTE — Progress Notes (Signed)
Subjective: Pt stable - reports knee pain   Objective: Vital signs in last 24 hours: Temp:  [97.4 F (36.3 C)-99 F (37.2 C)] 98.4 F (36.9 C) (01/18 1011) Pulse Rate:  [67-81] 75  (01/18 1011) Resp:  [11-18] 16  (01/18 1011) BP: (91-122)/(56-83) 92/63 mmHg (01/18 1011) SpO2:  [95 %-100 %] 97 % (01/18 1011) Weight:  [56.7 kg (125 lb)] 56.7 kg (125 lb) (01/17 1300)  Intake/Output from previous day: 01/17 0701 - 01/18 0700 In: 2871.3 [P.O.:820; I.V.:1951.3; IV Piggyback:100] Out: 1750 [Urine:1500; Blood:250] Intake/Output this shift: Total I/O In: 240 [P.O.:240] Out: -   Exam:  Neurovascular intact Sensation intact distally Intact pulses distally Dorsiflexion/Plantar flexion intact knee no effusion - ext mech ok  Labs:  [facility] 11/01/12 0445  HGB 10.6*    Basename 11/01/12 0445  WBC 8.5  RBC 3.42*  HCT 30.6*  PLT 174    Basename 11/01/12 0445  NA 131*  K 2.9*  CL 95*  CO2 30  BUN 10  CREATININE 0.76  GLUCOSE 101*  CALCIUM 7.8*   No results found for this basename: LABPT:2,INR:2 in the last 72 hours  Assessment/Plan: k low - supplement given - recheck today - mobilize with PT - hl iv   [name] 11/01/2012, 10:16 AM

## 2012-11-01 NOTE — Progress Notes (Signed)
Physical Therapy Treatment Patient Details Name: [name] MRN: [id number] DOB: 1946/06/24 Today's Date: 11/01/2012 Time: 9562-1308 PT Time Calculation (min): 26 min  PT Assessment / Plan / Recommendation Comments on Treatment Session  Pt continues to progress well and will be ready for D/C tomorrow.     Follow Up Recommendations  Home health PT     Does the patient have the potential to tolerate intense rehabilitation     Barriers to Discharge        Equipment Recommendations  None recommended by PT    Recommendations for Other Services    Frequency 7X/week   Plan Discharge plan remains appropriate    Precautions / Restrictions Precautions Precautions: Fall Restrictions Weight Bearing Restrictions: No Other Position/Activity Restrictions: WBAT   Pertinent Vitals/Pain 4/10    Mobility  Bed Mobility Bed Mobility: Supine to Sit;Sit to Supine Supine to Sit: 4: Min assist Sit to Supine: 4: Min assist Details for Bed Mobility Assistance: Assist for RLE into and out of bed.  Pt was able to assist RLE with LLE into bed with little assist.  Transfers Transfers: Sit to Stand;Stand to Sit Sit to Stand: 5: Supervision;With upper extremity assist;From bed Stand to Sit: 5: Supervision;With armrests;To chair/3-in-1 Details for Transfer Assistance: Min cues for hand placement.  Ambulation/Gait Ambulation/Gait Assistance: 4: Min guard Ambulation Distance (Feet): 300 Feet Assistive device: Rolling walker Ambulation/Gait Assistance Details: Min cues for upright posture.  Gait Pattern: Step-to pattern;Step-through pattern Stairs: Yes Stairs Assistance: 4: Min guard Stair Management Technique: No rails;Step to pattern;Backwards;With walker Number of Stairs: 2     Exercises Total Joint Exercises Ankle Circles/Pumps: AROM;Both;20 reps Quad Sets: AROM;10 reps;Supine;Right Short Arc Quad: AROM;Right;10 reps Heel Slides: AAROM;Supine;Right;10 reps Hip ABduction/ADduction:  AAROM;Right;10 reps;Supine   PT Diagnosis:    PT Problem List:   PT Treatment Interventions:     PT Goals Acute Rehab PT Goals PT Goal Formulation: With patient Time For Goal Achievement: 11/06/12 Potential to Achieve Goals: Good Pt will go Supine/Side to Sit: with supervision PT Goal: Supine/Side to Sit - Progress: Progressing toward goal Pt will go Sit to Supine/Side: with supervision PT Goal: Sit to Supine/Side - Progress: Progressing toward goal Pt will go Sit to Stand: with supervision PT Goal: Sit to Stand - Progress: Met Pt will go Stand to Sit: with supervision PT Goal: Stand to Sit - Progress: Met Pt will Ambulate: 51 - 150 feet;with supervision;with rolling walker PT Goal: Ambulate - Progress: Progressing toward goal Pt will Go Up / Down Stairs: Flight;with min assist;with least restrictive assistive device PT Goal: Up/Down Stairs - Progress: Progressing toward goal  Visit Information  Last PT Received On: 11/01/12 Assistance Needed: +1    Subjective Data  Subjective: I'm just tired.  Patient Stated Goal: Resume previous lifestyle with decreased pain   Cognition  Overall Cognitive Status: Appears within functional limits for tasks assessed/performed Arousal/Alertness: Awake/alert Orientation Level: Appears intact for tasks assessed Behavior During Session: [facility] for tasks performed    Balance     End of Session PT - End of Session Activity Tolerance: Patient tolerated treatment well Patient left: in bed;with call bell/phone within reach;with family/visitor present;with nursing in room Nurse Communication: Mobility status   GP     Vista Deck 11/01/2012, 5:17 PM

## 2012-11-01 NOTE — Progress Notes (Signed)
Physical Therapy Treatment Patient Details Name: CONSETTA COSNER MRN: [id number] DOB: 06/18/1946 Today's Date: 11/01/2012 Time: 4098-1191 PT Time Calculation (min): 14 min  PT Assessment / Plan / Recommendation Comments on Treatment Session  Pt progressing well with mobility and will perform LE exercises in pm.     Follow Up Recommendations  Home health PT     Does the patient have the potential to tolerate intense rehabilitation     Barriers to Discharge        Equipment Recommendations  None recommended by PT    Recommendations for Other Services    Frequency 7X/week   Plan Discharge plan remains appropriate    Precautions / Restrictions Precautions Precautions: Fall Restrictions Weight Bearing Restrictions: No Other Position/Activity Restrictions: WBAT   Pertinent Vitals/Pain 4/10    Mobility  Bed Mobility Bed Mobility: Supine to Sit Supine to Sit: 4: Min assist Details for Bed Mobility Assistance: Assist for RLE out of bed with cues for hand placement on bed.  Transfers Transfers: Sit to Stand Sit to Stand: 4: Min guard;From elevated surface;With upper extremity assist;From bed Stand to Sit: 4: Min guard;With upper extremity assist;With armrests;To chair/3-in-1 Details for Transfer Assistance: Cues for hand placement and LE management.  Ambulation/Gait Ambulation/Gait Assistance: 4: Min guard Ambulation Distance (Feet): 85 Feet Assistive device: Rolling walker Ambulation/Gait Assistance Details: Cues for sequencing/technique with RW and to maintain upright posture.  Gait Pattern: Step-to pattern    Exercises     PT Diagnosis:    PT Problem List:   PT Treatment Interventions:     PT Goals Acute Rehab PT Goals PT Goal Formulation: With patient Time For Goal Achievement: 11/06/12 Potential to Achieve Goals: Good Pt will go Supine/Side to Sit: with supervision PT Goal: Supine/Side to Sit - Progress: Progressing toward goal Pt will go Sit to Stand: with  supervision PT Goal: Sit to Stand - Progress: Progressing toward goal Pt will go Stand to Sit: with supervision PT Goal: Stand to Sit - Progress: Progressing toward goal Pt will Ambulate: 51 - 150 feet;with supervision;with rolling walker PT Goal: Ambulate - Progress: Progressing toward goal  Visit Information  Last PT Received On: 11/01/12 Assistance Needed: +1    Subjective Data  Subjective: I feel pretty good.  Patient Stated Goal: Resume previous lifestyle with decreased pain   Cognition  Overall Cognitive Status: Appears within functional limits for tasks assessed/performed Arousal/Alertness: Awake/alert Orientation Level: Appears intact for tasks assessed Behavior During Session: [facility] for tasks performed    Balance     End of Session PT - End of Session Activity Tolerance: Patient tolerated treatment well Patient left:  (with OT in restroom.) Nurse Communication: Mobility status   GP     Vista Deck 11/01/2012, 1:30 PM

## 2012-11-02 LAB — CBC
HCT: 30.6 % — ABNORMAL LOW (ref 36.0–46.0)
Hemoglobin: 10.7 g/dL — ABNORMAL LOW (ref 12.0–15.0)
MCH: 31.2 pg (ref 26.0–34.0)
RBC: 3.43 MIL/uL — ABNORMAL LOW (ref 3.87–5.11)

## 2012-11-02 MED ORDER — POLYETHYLENE GLYCOL 3350 17 G PO PACK
17.0000 g | PACK | Freq: Two times a day (BID) | ORAL | Status: DC
  Administered 2012-11-02 (×2): 17 g via ORAL

## 2012-11-02 NOTE — Progress Notes (Signed)
Physical Therapy Treatment Patient Details Name: [name] MRN: [id number] DOB: 1945-12-04 Today's Date: 11/02/2012 Time: 1914-7829 PT Time Calculation (min): 14 min  PT Assessment / Plan / Recommendation Comments on Treatment Session  Progressing well.  Ready for D/C in am.     Follow Up Recommendations  Home health PT     Does the patient have the potential to tolerate intense rehabilitation     Barriers to Discharge        Equipment Recommendations  None recommended by PT    Recommendations for Other Services    Frequency 7X/week   Plan Discharge plan remains appropriate    Precautions / Restrictions Precautions Precautions: Fall Restrictions Weight Bearing Restrictions: No Other Position/Activity Restrictions: WBAT   Pertinent Vitals/Pain 2/10    Mobility  Bed Mobility Bed Mobility: Supine to Sit;Sit to Supine Supine to Sit: 5: Supervision Sit to Supine: 4: Min guard Details for Bed Mobility Assistance: Guarding assist for RLE into bed.  Had pt use LLE to assist RLE Transfers Transfers: Sit to Stand;Stand to Sit Sit to Stand: 6: Modified independent (Device/Increase time) Stand to Sit: 6: Modified independent (Device/Increase time) Ambulation/Gait Ambulation/Gait Assistance: 5: Supervision Ambulation Distance (Feet): 330 Feet Assistive device: Rolling walker Ambulation/Gait Assistance Details: Continue to provide min cues for less antalgic gait pattern.  Gait Pattern: Step-to pattern;Step-through pattern    Exercises     PT Diagnosis:    PT Problem List:   PT Treatment Interventions:     PT Goals Acute Rehab PT Goals PT Goal Formulation: With patient Time For Goal Achievement: 11/06/12 Potential to Achieve Goals: Good Pt will go Supine/Side to Sit: with supervision PT Goal: Supine/Side to Sit - Progress: Met Pt will go Sit to Supine/Side: with supervision PT Goal: Sit to Supine/Side - Progress: Progressing toward goal Pt will go Sit to Stand:  with supervision PT Goal: Sit to Stand - Progress: Met Pt will go Stand to Sit: with supervision PT Goal: Stand to Sit - Progress: Met Pt will Ambulate: 51 - 150 feet;with modified independence;with least restrictive assistive device PT Goal: Ambulate - Progress: Updated due to goal met  Visit Information  Last PT Received On: 11/02/12 Assistance Needed: +1    Subjective Data  Subjective: I'll walk again Patient Stated Goal: Resume previous lifestyle with decreased pain   Cognition  Overall Cognitive Status: Appears within functional limits for tasks assessed/performed Arousal/Alertness: Awake/alert Orientation Level: Appears intact for tasks assessed Behavior During Session: [name] for tasks performed    Balance     End of Session PT - End of Session Activity Tolerance: Patient tolerated treatment well Patient left: in bed;with call bell/phone within reach   GP     Vista Deck 11/02/2012, 4:44 PM

## 2012-11-02 NOTE — Progress Notes (Signed)
Subjective: Pt stable - walking in halls   Objective: Vital signs in last 24 hours: Temp:  [98.4 F (36.9 C)-99.4 F (37.4 C)] 98.6 F (37 C) (01/19 0528) Pulse Rate:  [75-107] 99  (01/19 0528) Resp:  [15-20] 20  (01/19 0528) BP: (92-104)/(59-68) 104/68 mmHg (01/19 0528) SpO2:  [93 %-98 %] 93 % (01/18 2212)  Intake/Output from previous day: 01/18 0701 - 01/19 0700 In: 720 [P.O.:720] Out: 1325 [Urine:1325] Intake/Output this shift: Total I/O In: -  Out: 400 [Urine:400]  Exam:  Neurovascular intact Sensation intact distally Intact pulses distally  Labs:  Basename 11/02/12 0455 11/01/12 0445  HGB 10.7* 10.6*    Basename 11/02/12 0455 11/01/12 0445  WBC 10.7* 8.5  RBC 3.43* 3.42*  HCT 30.6* 30.6*  PLT 157 174    Basename 11/01/12 1322 11/01/12 0445  NA 128* 131*  K 3.5 2.9*  CL 92* 95*  CO2 29 30  BUN 11 10  CREATININE 0.77 0.76  GLUCOSE 140* 101*  CALCIUM 8.2* 7.8*   No results found for this basename: LABPT:2,INR:2 in the last 72 hours  Assessment/Plan: Pt stable - na low - k improved - recheck am   DEAN,GREGORY SCOTT 11/02/2012, 9:15 AM

## 2012-11-02 NOTE — Progress Notes (Signed)
Physical Therapy Treatment Patient Details Name: [name] MRN: [id number] DOB: [birth year] Today's Date: 11/02/2012 Time: 7846-9629 PT Time Calculation (min): 27 min  PT Assessment / Plan / Recommendation Comments on Treatment Session  Pt progressing very well and has practiced stairs today.     Follow Up Recommendations  Home health PT     Does the patient have the potential to tolerate intense rehabilitation     Barriers to Discharge        Equipment Recommendations  None recommended by PT    Recommendations for Other Services    Frequency 7X/week   Plan Discharge plan remains appropriate    Precautions / Restrictions Precautions Precautions: Fall Restrictions Weight Bearing Restrictions: No Other Position/Activity Restrictions: WBAT   Pertinent Vitals/Pain 3/10 ice pack applied    Mobility  Bed Mobility Bed Mobility: Supine to Sit Supine to Sit: 4: Min guard Details for Bed Mobility Assistance: Guarding assist for RLE out of bed with pt demonstrating good use of UE to self assist.  Transfers Transfers: Sit to Stand;Stand to Sit Sit to Stand: 6: Modified independent (Device/Increase time) Stand to Sit: 6: Modified independent (Device/Increase time) Ambulation/Gait Ambulation/Gait Assistance: 5: Supervision Ambulation Distance (Feet): 300 Feet Assistive device: Rolling walker Ambulation/Gait Assistance Details: Min cues for upright posture and less antalgic gait pattern.  Gait Pattern: Step-to pattern;Step-through pattern Stairs Assistance: 4: Min guard Stairs Assistance Details (indicate cue type and reason): Cues for sequencing/technique with crutch/cane and one rail.   Stair Management Technique: One rail Right;Step to pattern;Forwards;With crutches (single crtuch) Number of Stairs: 4     Exercises Total Joint Exercises Ankle Circles/Pumps: AROM;Both;20 reps Quad Sets: AROM;10 reps;Right Short Arc Quad: AROM;Right;10 reps Heel Slides:  AAROM;Supine;Right;10 reps Hip ABduction/ADduction: AAROM;Right;10 reps;Supine   PT Diagnosis:    PT Problem List:   PT Treatment Interventions:     PT Goals Acute Rehab PT Goals PT Goal Formulation: With patient Time For Goal Achievement: 11/06/12 Potential to Achieve Goals: Good Pt will go Supine/Side to Sit: with supervision PT Goal: Supine/Side to Sit - Progress: Progressing toward goal Pt will go Sit to Stand: with supervision PT Goal: Sit to Stand - Progress: Met Pt will go Stand to Sit: with supervision PT Goal: Stand to Sit - Progress: Met Pt will Ambulate: 51 - 150 feet;with supervision;with rolling walker PT Goal: Ambulate - Progress: Met Pt will Go Up / Down Stairs: Flight;with min assist;with least restrictive assistive device PT Goal: Up/Down Stairs - Progress: Partly met  Visit Information  Last PT Received On: 11/02/12 Assistance Needed: +1    Subjective Data  Subjective: I'm not as sore today Patient Stated Goal: Resume previous lifestyle with decreased pain   Cognition  Overall Cognitive Status: Appears within functional limits for tasks assessed/performed Arousal/Alertness: Awake/alert Orientation Level: Appears intact for tasks assessed Behavior During Session: [facility] for tasks performed    Balance     End of Session PT - End of Session Activity Tolerance: Patient tolerated treatment well Patient left: in chair;with call bell/phone within reach Nurse Communication: Mobility status   GP     Vista Deck 11/02/2012, 9:11 AM

## 2012-11-03 ENCOUNTER — Encounter (HOSPITAL_COMMUNITY): Payer: Self-pay | Admitting: Orthopaedic Surgery

## 2012-11-03 LAB — CBC
HCT: 31.9 % — ABNORMAL LOW (ref 36.0–46.0)
MCV: 89.6 fL (ref 78.0–100.0)
RBC: 3.56 MIL/uL — ABNORMAL LOW (ref 3.87–5.11)
RDW: 13.2 % (ref 11.5–15.5)
WBC: 10 10*3/uL (ref 4.0–10.5)

## 2012-11-03 LAB — BASIC METABOLIC PANEL
BUN: 9 mg/dL (ref 6–23)
CO2: 30 mEq/L (ref 19–32)
Chloride: 91 mEq/L — ABNORMAL LOW (ref 96–112)
Creatinine, Ser: 0.66 mg/dL (ref 0.50–1.10)

## 2012-11-03 MED ORDER — DSS 100 MG PO CAPS: 100.0000 mg | ORAL_CAPSULE | Freq: Two times a day (BID) | ORAL | Status: DC

## 2012-11-03 MED ORDER — ASPIRIN 325 MG PO TBEC: 325.0000 mg | DELAYED_RELEASE_TABLET | Freq: Two times a day (BID) | ORAL | Status: DC

## 2012-11-03 MED ORDER — OXYCODONE-ACETAMINOPHEN 5-325 MG PO TABS: 1.0000 | ORAL_TABLET | ORAL | Status: AC | PRN

## 2012-11-03 NOTE — Care Management Note (Signed)
    Page 1 of 2   11/03/2012     6:50:57 PM   CARE MANAGEMENT NOTE 11/03/2012  Patient:  [name], [name]   Account Number:  [id number]  Date Initiated:  11/03/2012  Documentation initiated by:  Colleen Can  Subjective/Objective Assessment:   dx osteoarethritis hip-right; total hip replacemnt-anterior approach    Per-arranged with Genevieve Norlander prior to admission     Action/Plan:   CM spoke with patient. Plans are for pt to return to [facility] where spouse will be caregiver.Will need RW   Anticipated DC Date:  11/03/2012   Anticipated DC Plan:  HOME W HOME HEALTH SERVICES      DC Planning Services  CM consult      [facility] Choice  HOME HEALTH   Choice offered to / List presented to:  C-1 Patient   DME arranged  Levan Hurst      DME agency  [facility]     HH arranged  HH-2 PT      [facility] agency  [facility]   Status of service:  Completed, signed off Medicare Important Message given?  NA - LOS <3 / Initial given by admissions (If response is "NO", the following Medicare IM given date fields will be blank) Date Medicare IM given:   Date Additional Medicare IM given:    Discharge Disposition:  HOME W HOME HEALTH SERVICES  Per UR Regulation:    If discussed at Long Length of Stay Meetings, dates discussed:    Comments:  11/03/2012 Colleen Can Bsn RN CCM [phone] Genevieve Norlander will start [facility] services tomorrow 11/04/2012

## 2012-11-03 NOTE — Progress Notes (Addendum)
Patient's morning BP 84/57 via [employer]. According to records, BP has been relatively low since surgery. Patient has ambulated multiple times to Bathroom and back to bed overnight, with no symptoms or complaints. Morning hgb/hct are 11.1/31.9. Will closely monitor, and notify physician as necessary. Sharlyne Koeneman Lynder Parents, RN 5:27 AM 11/03/2012   Manual BP checked by NT 90/50. Syrena Burges [place], [place] 11/03/2012 5:32 AM

## 2012-11-03 NOTE — Progress Notes (Signed)
Physical Therapy Treatment Patient Details Name: [name] MRN: [id number] DOB: 1946-02-05 Today's Date: 11/03/2012 Time: 4098-1191 PT Time Calculation (min): 15 min  PT Assessment / Plan / Recommendation Comments on Treatment Session  Progressing well. Introduced [facility] with return demonstration.  Ready for D/C.     Follow Up Recommendations  Home health PT     Does the patient have the potential to tolerate intense rehabilitation     Barriers to Discharge        Equipment Recommendations  Rolling walker with 5" wheels    Recommendations for Other Services    Frequency 7X/week   Plan Discharge plan remains appropriate    Precautions / Restrictions Precautions Precautions: Fall Restrictions Weight Bearing Restrictions: No Other Position/Activity Restrictions: WBAT   Pertinent Vitals/Pain 2/10    Mobility  Bed Mobility Bed Mobility: Supine to Sit;Sit to Supine Supine to Sit: 6: Modified independent (Device/Increase time) Sit to Supine: 6: Modified independent (Device/Increase time) Transfers Transfers: Sit to Stand;Stand to Sit Sit to Stand: 6: Modified independent (Device/Increase time) Stand to Sit: 6: Modified independent (Device/Increase time) Ambulation/Gait Ambulation/Gait Assistance: 6: Modified independent (Device/Increase time);4: Min guard Ambulation Distance (Feet): 350 Feet Assistive device: Rolling walker;Straight cane Ambulation/Gait Assistance Details: Ambulated with RW initially to get RLE warm then had pt trial SPC with cues for sequencing/technique.  She did very well with SPC, however did note increased fatigue.   Gait Pattern: Step-to pattern;Step-through pattern Stairs: Yes Stairs Assistance: 4: Min guard Stairs Assistance Details (indicate cue type and reason): Cues for using SPC and rail Stair Management Technique: One rail Right;Step to pattern;Forwards;With cane Number of Stairs: 4     Exercises     PT Diagnosis:    PT Problem List:     PT Treatment Interventions:     PT Goals Acute Rehab PT Goals PT Goal Formulation: With patient Time For Goal Achievement: 11/06/12 Potential to Achieve Goals: Good Pt will go Supine/Side to Sit: with supervision PT Goal: Supine/Side to Sit - Progress: Met Pt will go Sit to Supine/Side: with supervision PT Goal: Sit to Supine/Side - Progress: Met Pt will go Sit to Stand: with supervision PT Goal: Sit to Stand - Progress: Met Pt will go Stand to Sit: with supervision PT Goal: Stand to Sit - Progress: Met Pt will Ambulate: 51 - 150 feet;with modified independence;with least restrictive assistive device PT Goal: Ambulate - Progress: Met Pt will Go Up / Down Stairs: Flight;with min assist;with least restrictive assistive device PT Goal: Up/Down Stairs - Progress: Partly met  Visit Information  Last PT Received On: 11/03/12 Assistance Needed: +1    Subjective Data  Subjective: I'm ready to get home.  Patient Stated Goal: Resume previous lifestyle with decreased pain   Cognition  Overall Cognitive Status: Appears within functional limits for tasks assessed/performed Arousal/Alertness: Awake/alert Orientation Level: Appears intact for tasks assessed Behavior During Session: [facility] for tasks performed    Balance     End of Session PT - End of Session Activity Tolerance: Patient tolerated treatment well Patient left: in bed;with call bell/phone within reach Nurse Communication: Mobility status   GP     Vista Deck 11/03/2012, 8:36 AM

## 2012-11-03 NOTE — Discharge Summary (Signed)
Patient ID: [name] MRN: [id number] DOB/AGE: [birth year] 68 y.o.  Admit date: 10/31/2012 Discharge date: 11/03/2012  Admission Diagnoses:  Principal Problem:  *Degenerative arthritis of hip   Discharge Diagnoses:  Same  Past Medical History  Diagnosis Date  . PONV (postoperative nausea and vomiting)   . Hypertension   . Hyperlipidemia   . Asthma     asthmatic bronchitis  . Arthritis     Surgeries: Procedure(s): TOTAL HIP ARTHROPLASTY ANTERIOR APPROACH on 10/31/2012   Consultants:    Discharged Condition: Improved  Hospital Course: [name] is an 67 y.o. female who was admitted 10/31/2012 for operative treatment ofDegenerative arthritis of hip. Patient has severe unremitting pain that affects sleep, daily activities, and work/hobbies. After pre-op clearance the patient was taken to the operating room on 10/31/2012 and underwent  Procedure(s): TOTAL HIP ARTHROPLASTY ANTERIOR APPROACH.    Patient was given perioperative antibiotics: Anti-infectives     Start     Dose/Rate Route Frequency Ordered Stop   10/31/12 1600   ceFAZolin (ANCEF) IVPB 1 g/50 mL premix        1 g 100 mL/hr over 30 Minutes Intravenous Every 6 hours 10/31/12 1320 10/31/12 2323   10/31/12 0725   ceFAZolin (ANCEF) IVPB 2 g/50 mL premix        2 g 100 mL/hr over 30 Minutes Intravenous On call to O.R. 10/31/12 0725 10/31/12 4098           Patient was given sequential compression devices, early ambulation, and chemoprophylaxis to prevent DVT.  Patient benefited maximally from hospital stay and there were no complications.    Recent vital signs: Patient Vitals for the past 24 hrs:  BP Temp Temp src Pulse Resp SpO2  11/03/12 0603 90/50 mmHg 98.1 F (36.7 C) Oral 70  20  94 %  11/02/12 2143 95/61 mmHg 98 F (36.7 C) Oral 68  20  93 %  11/02/12 1354 99/61 mmHg 98.3 F (36.8 C) Oral 87  19  92 %  11/02/12 0800 - - - - 20  93 %     Recent laboratory studies:  Basename 11/03/12 0447  11/03/12 0445 11/02/12 0455 11/01/12 1322  WBC -- 10.0 10.7* --  HGB -- 11.1* 10.7* --  HCT -- 31.9* 30.6* --  PLT -- 161 157 --  NA 131* -- -- 128*  K 3.3* -- -- 3.5  CL 91* -- -- 92*  CO2 30 -- -- 29  BUN 9 -- -- 11  CREATININE 0.66 -- -- 0.77  GLUCOSE 174* -- -- 140*  INR -- -- -- --  CALCIUM 8.9 -- -- --     Discharge Medications:     Medication List     As of 11/03/2012  7:30 AM    STOP taking these medications         ibuprofen 200 MG tablet   Commonly known as: ADVIL,MOTRIN      TAKE these medications         albuterol 108 (90 BASE) MCG/ACT inhaler   Commonly known as: PROVENTIL HFA;VENTOLIN HFA   Inhale 2 puffs into the lungs every 6 (six) hours as needed. For shortness of breath      aspirin 325 MG EC tablet   Take 1 tablet (325 mg total) by mouth 2 (two) times daily.      CALCIUM 600 + D PO   Take 1 tablet by mouth 2 (two) times daily.      diltiazem 300  MG 24 hr capsule   Commonly known as: TIAZAC   Take 300 mg by mouth every morning.      diphenhydrAMINE 25 mg capsule   Commonly known as: BENADRYL   Take 50 mg by mouth every 6 (six) hours as needed. For allergies      DSS 100 MG Caps   Take 100 mg by mouth 2 (two) times daily.      fluticasone 50 MCG/ACT nasal spray   Commonly known as: FLONASE   Place 2 sprays into the nose daily as needed. For nasal decongestant      guaiFENesin 600 MG 12 hr tablet   Commonly known as: MUCINEX   Take 600 mg by mouth 2 (two) times daily.      oxyCODONE-acetaminophen 5-325 MG per tablet   Commonly known as: PERCOCET/ROXICET   Take 1-2 tablets by mouth every 4 (four) hours as needed for pain.      simvastatin 20 MG tablet   Commonly known as: ZOCOR   Take 20 mg by mouth every morning.      sodium chloride 0.65 % nasal spray   Commonly known as: OCEAN   Place 1 spray into the nose daily as needed. For nasal decongestant      telmisartan 40 MG tablet   Commonly known as: MICARDIS   Take 40 mg by mouth  every morning.      traMADol 50 MG tablet   Commonly known as: ULTRAM   Take 50-100 mg by mouth every 8 (eight) hours as needed. For pain      triamterene-hydrochlorothiazide 37.5-25 MG per tablet   Commonly known as: MAXZIDE-25   Take 1 tablet by mouth every morning.      zolpidem 10 MG tablet   Commonly known as: AMBIEN   Take 10 mg by mouth at bedtime as needed. For sleep        Diagnostic Studies: Dg Hip Complete Right  10/31/2012  *RADIOLOGY REPORT*  Clinical Data: Right hip replacement  RIGHT HIP - COMPLETE 2+ VIEW  Comparison: None.  Findings: Two spot fluoroscopic views of the lower pelvis demonstrate a right total hip arthroplasty.  No periprosthetic fracture or hardware complication is identified.  A linear metallic structure projects over the soft tissues inferior to the inferior right pubic ramus.  IMPRESSION: Intraoperative fluoroscopy for right hip arthroplasty.  No complicating feature identified.   Original Report Authenticated By: Britta Mccreedy, M.D.    Dg Pelvis Portable  10/31/2012  *RADIOLOGY REPORT*  Clinical Data: Status post right total hip arthroplasty.  PORTABLE PELVIS,PORTABLE RIGHT HIP - 1 VIEW  Comparison: Intraoperative radiograph 10/31/2012.  Findings: AP pelvis and lateral view of the right hip demonstrate postoperative changes of right total hip arthroplasty.  The femoral and acetabular components of the prosthesis appear properly located without definite periprosthetic fracture or other immediate complicating features.  The prosthetic femoral head is properly located within the prosthetic acetabulum.  Gas is present within the overlying soft tissues.  IMPRESSION: 1.  Expected postoperative appearance of right total hip arthroplasty, as above, without immediate complicating features.   Original Report Authenticated By: Trudie Reed, M.D.    Dg Hip Portable 1 View Right  10/31/2012  *RADIOLOGY REPORT*  Clinical Data: Status post right total hip arthroplasty.   PORTABLE PELVIS,PORTABLE RIGHT HIP - 1 VIEW  Comparison: Intraoperative radiograph 10/31/2012.  Findings: AP pelvis and lateral view of the right hip demonstrate postoperative changes of right total hip arthroplasty.  The femoral and  acetabular components of the prosthesis appear properly located without definite periprosthetic fracture or other immediate complicating features.  The prosthetic femoral head is properly located within the prosthetic acetabulum.  Gas is present within the overlying soft tissues.  IMPRESSION: 1.  Expected postoperative appearance of right total hip arthroplasty, as above, without immediate complicating features.   Original Report Authenticated By: Trudie Reed, M.D.    Dg C-arm 1-60 Min-no Report  10/31/2012  CLINICAL DATA: right anterior hip replacement   C-ARM 1-60 MINUTES  Fluoroscopy was utilized by the requesting physician.  No radiographic  interpretation.      Disposition:  to home      Discharge Orders    Future Orders Please Complete By Expires   Diet - low sodium heart healthy      Call MD / Call 911      Comments:   If you experience chest pain or shortness of breath, CALL 911 and be transported to the hospital emergency room.  If you develope a fever above 101 F, pus (white drainage) or increased drainage or redness at the wound, or calf pain, call your surgeon's office.   Constipation Prevention      Comments:   Drink plenty of fluids.  Prune juice may be helpful.  You may use a stool softener, such as Colace (over the counter) 100 mg twice a day.  Use MiraLax (over the counter) for constipation as needed.   Increase activity slowly as tolerated      Discharge instructions      Comments:   Increase your activities as comfort allows. You can remove your dressing this Wed. 11/05/12 and get your incision wet in the shower. New dry dressing daily starting 1/22   Discharge patient         Follow-up Information    Follow up with  Kathryne Hitch, MD. In 2 weeks.   Contact information:   [address] [place] [address] [phone]           Signed: Kathryne Hitch 11/03/2012, 7:30 AM

## 2012-11-04 DIAGNOSIS — IMO0001 Reserved for inherently not codable concepts without codable children: Secondary | ICD-10-CM | POA: Diagnosis not present

## 2012-11-04 DIAGNOSIS — I10 Essential (primary) hypertension: Secondary | ICD-10-CM | POA: Diagnosis not present

## 2012-11-04 DIAGNOSIS — J45909 Unspecified asthma, uncomplicated: Secondary | ICD-10-CM | POA: Diagnosis not present

## 2012-11-04 DIAGNOSIS — Z96649 Presence of unspecified artificial hip joint: Secondary | ICD-10-CM | POA: Diagnosis not present

## 2012-11-04 DIAGNOSIS — Z471 Aftercare following joint replacement surgery: Secondary | ICD-10-CM | POA: Diagnosis not present

## 2012-11-04 DIAGNOSIS — M169 Osteoarthritis of hip, unspecified: Secondary | ICD-10-CM | POA: Diagnosis not present

## 2012-11-05 MED FILL — Acetaminophen IV Soln 10 MG/ML: INTRAVENOUS | Qty: 100 | Status: AC

## 2012-11-07 DIAGNOSIS — Z471 Aftercare following joint replacement surgery: Secondary | ICD-10-CM | POA: Diagnosis not present

## 2012-11-07 DIAGNOSIS — J45909 Unspecified asthma, uncomplicated: Secondary | ICD-10-CM | POA: Diagnosis not present

## 2012-11-07 DIAGNOSIS — Z96649 Presence of unspecified artificial hip joint: Secondary | ICD-10-CM | POA: Diagnosis not present

## 2012-11-07 DIAGNOSIS — IMO0001 Reserved for inherently not codable concepts without codable children: Secondary | ICD-10-CM | POA: Diagnosis not present

## 2012-11-07 DIAGNOSIS — I10 Essential (primary) hypertension: Secondary | ICD-10-CM | POA: Diagnosis not present

## 2012-11-10 DIAGNOSIS — J45909 Unspecified asthma, uncomplicated: Secondary | ICD-10-CM | POA: Diagnosis not present

## 2012-11-10 DIAGNOSIS — Z96649 Presence of unspecified artificial hip joint: Secondary | ICD-10-CM | POA: Diagnosis not present

## 2012-11-10 DIAGNOSIS — IMO0001 Reserved for inherently not codable concepts without codable children: Secondary | ICD-10-CM | POA: Diagnosis not present

## 2012-11-10 DIAGNOSIS — Z471 Aftercare following joint replacement surgery: Secondary | ICD-10-CM | POA: Diagnosis not present

## 2012-11-10 DIAGNOSIS — I10 Essential (primary) hypertension: Secondary | ICD-10-CM | POA: Diagnosis not present

## 2012-11-13 DIAGNOSIS — M169 Osteoarthritis of hip, unspecified: Secondary | ICD-10-CM | POA: Diagnosis not present

## 2012-11-14 DIAGNOSIS — Z471 Aftercare following joint replacement surgery: Secondary | ICD-10-CM | POA: Diagnosis not present

## 2012-11-14 DIAGNOSIS — J45909 Unspecified asthma, uncomplicated: Secondary | ICD-10-CM | POA: Diagnosis not present

## 2012-11-14 DIAGNOSIS — Z96649 Presence of unspecified artificial hip joint: Secondary | ICD-10-CM | POA: Diagnosis not present

## 2012-11-14 DIAGNOSIS — I10 Essential (primary) hypertension: Secondary | ICD-10-CM | POA: Diagnosis not present

## 2012-11-14 DIAGNOSIS — IMO0001 Reserved for inherently not codable concepts without codable children: Secondary | ICD-10-CM | POA: Diagnosis not present

## 2012-11-17 DIAGNOSIS — J45909 Unspecified asthma, uncomplicated: Secondary | ICD-10-CM | POA: Diagnosis not present

## 2012-11-17 DIAGNOSIS — I10 Essential (primary) hypertension: Secondary | ICD-10-CM | POA: Diagnosis not present

## 2012-11-17 DIAGNOSIS — E785 Hyperlipidemia, unspecified: Secondary | ICD-10-CM | POA: Diagnosis not present

## 2012-11-17 DIAGNOSIS — Z1331 Encounter for screening for depression: Secondary | ICD-10-CM | POA: Diagnosis not present

## 2012-11-18 DIAGNOSIS — Z96649 Presence of unspecified artificial hip joint: Secondary | ICD-10-CM | POA: Diagnosis not present

## 2012-11-18 DIAGNOSIS — J45909 Unspecified asthma, uncomplicated: Secondary | ICD-10-CM | POA: Diagnosis not present

## 2012-11-18 DIAGNOSIS — IMO0001 Reserved for inherently not codable concepts without codable children: Secondary | ICD-10-CM | POA: Diagnosis not present

## 2012-11-18 DIAGNOSIS — I10 Essential (primary) hypertension: Secondary | ICD-10-CM | POA: Diagnosis not present

## 2012-11-18 DIAGNOSIS — Z471 Aftercare following joint replacement surgery: Secondary | ICD-10-CM | POA: Diagnosis not present

## 2012-11-19 DIAGNOSIS — J45909 Unspecified asthma, uncomplicated: Secondary | ICD-10-CM | POA: Diagnosis not present

## 2012-11-19 DIAGNOSIS — I10 Essential (primary) hypertension: Secondary | ICD-10-CM | POA: Diagnosis not present

## 2012-11-19 DIAGNOSIS — Z96649 Presence of unspecified artificial hip joint: Secondary | ICD-10-CM | POA: Diagnosis not present

## 2012-11-19 DIAGNOSIS — Z471 Aftercare following joint replacement surgery: Secondary | ICD-10-CM | POA: Diagnosis not present

## 2012-11-19 DIAGNOSIS — IMO0001 Reserved for inherently not codable concepts without codable children: Secondary | ICD-10-CM | POA: Diagnosis not present

## 2012-11-21 DIAGNOSIS — IMO0001 Reserved for inherently not codable concepts without codable children: Secondary | ICD-10-CM | POA: Diagnosis not present

## 2012-11-21 DIAGNOSIS — I10 Essential (primary) hypertension: Secondary | ICD-10-CM | POA: Diagnosis not present

## 2012-11-21 DIAGNOSIS — Z96649 Presence of unspecified artificial hip joint: Secondary | ICD-10-CM | POA: Diagnosis not present

## 2012-11-21 DIAGNOSIS — J45909 Unspecified asthma, uncomplicated: Secondary | ICD-10-CM | POA: Diagnosis not present

## 2012-11-21 DIAGNOSIS — Z471 Aftercare following joint replacement surgery: Secondary | ICD-10-CM | POA: Diagnosis not present

## 2012-11-24 DIAGNOSIS — Z471 Aftercare following joint replacement surgery: Secondary | ICD-10-CM | POA: Diagnosis not present

## 2012-11-24 DIAGNOSIS — Z96649 Presence of unspecified artificial hip joint: Secondary | ICD-10-CM | POA: Diagnosis not present

## 2012-11-24 DIAGNOSIS — I10 Essential (primary) hypertension: Secondary | ICD-10-CM | POA: Diagnosis not present

## 2012-11-24 DIAGNOSIS — J45909 Unspecified asthma, uncomplicated: Secondary | ICD-10-CM | POA: Diagnosis not present

## 2012-11-24 DIAGNOSIS — IMO0001 Reserved for inherently not codable concepts without codable children: Secondary | ICD-10-CM | POA: Diagnosis not present

## 2012-11-25 DIAGNOSIS — Z96649 Presence of unspecified artificial hip joint: Secondary | ICD-10-CM | POA: Diagnosis not present

## 2012-11-25 DIAGNOSIS — I10 Essential (primary) hypertension: Secondary | ICD-10-CM | POA: Diagnosis not present

## 2012-11-25 DIAGNOSIS — IMO0001 Reserved for inherently not codable concepts without codable children: Secondary | ICD-10-CM | POA: Diagnosis not present

## 2012-11-25 DIAGNOSIS — J45909 Unspecified asthma, uncomplicated: Secondary | ICD-10-CM | POA: Diagnosis not present

## 2012-11-25 DIAGNOSIS — Z471 Aftercare following joint replacement surgery: Secondary | ICD-10-CM | POA: Diagnosis not present

## 2012-11-29 DIAGNOSIS — Z96649 Presence of unspecified artificial hip joint: Secondary | ICD-10-CM | POA: Diagnosis not present

## 2012-11-29 DIAGNOSIS — IMO0001 Reserved for inherently not codable concepts without codable children: Secondary | ICD-10-CM | POA: Diagnosis not present

## 2012-11-29 DIAGNOSIS — Z471 Aftercare following joint replacement surgery: Secondary | ICD-10-CM | POA: Diagnosis not present

## 2012-11-29 DIAGNOSIS — J45909 Unspecified asthma, uncomplicated: Secondary | ICD-10-CM | POA: Diagnosis not present

## 2012-11-29 DIAGNOSIS — I10 Essential (primary) hypertension: Secondary | ICD-10-CM | POA: Diagnosis not present

## 2012-12-17 DIAGNOSIS — M5412 Radiculopathy, cervical region: Secondary | ICD-10-CM | POA: Diagnosis not present

## 2012-12-17 DIAGNOSIS — M542 Cervicalgia: Secondary | ICD-10-CM | POA: Diagnosis not present

## 2012-12-17 DIAGNOSIS — M9981 Other biomechanical lesions of cervical region: Secondary | ICD-10-CM | POA: Diagnosis not present

## 2012-12-18 DIAGNOSIS — M9981 Other biomechanical lesions of cervical region: Secondary | ICD-10-CM | POA: Diagnosis not present

## 2012-12-18 DIAGNOSIS — M542 Cervicalgia: Secondary | ICD-10-CM | POA: Diagnosis not present

## 2012-12-18 DIAGNOSIS — M5412 Radiculopathy, cervical region: Secondary | ICD-10-CM | POA: Diagnosis not present

## 2012-12-22 DIAGNOSIS — M9981 Other biomechanical lesions of cervical region: Secondary | ICD-10-CM | POA: Diagnosis not present

## 2012-12-22 DIAGNOSIS — M542 Cervicalgia: Secondary | ICD-10-CM | POA: Diagnosis not present

## 2012-12-22 DIAGNOSIS — M5412 Radiculopathy, cervical region: Secondary | ICD-10-CM | POA: Diagnosis not present

## 2012-12-23 DIAGNOSIS — M542 Cervicalgia: Secondary | ICD-10-CM | POA: Diagnosis not present

## 2012-12-23 DIAGNOSIS — M5412 Radiculopathy, cervical region: Secondary | ICD-10-CM | POA: Diagnosis not present

## 2012-12-23 DIAGNOSIS — M9981 Other biomechanical lesions of cervical region: Secondary | ICD-10-CM | POA: Diagnosis not present

## 2012-12-25 DIAGNOSIS — M5412 Radiculopathy, cervical region: Secondary | ICD-10-CM | POA: Diagnosis not present

## 2012-12-25 DIAGNOSIS — H5200 Hypermetropia, unspecified eye: Secondary | ICD-10-CM | POA: Diagnosis not present

## 2012-12-25 DIAGNOSIS — M542 Cervicalgia: Secondary | ICD-10-CM | POA: Diagnosis not present

## 2012-12-25 DIAGNOSIS — H524 Presbyopia: Secondary | ICD-10-CM | POA: Diagnosis not present

## 2012-12-25 DIAGNOSIS — H2510 Age-related nuclear cataract, unspecified eye: Secondary | ICD-10-CM | POA: Diagnosis not present

## 2012-12-25 DIAGNOSIS — M9981 Other biomechanical lesions of cervical region: Secondary | ICD-10-CM | POA: Diagnosis not present

## 2013-01-13 DIAGNOSIS — M5412 Radiculopathy, cervical region: Secondary | ICD-10-CM | POA: Diagnosis not present

## 2013-01-13 DIAGNOSIS — M542 Cervicalgia: Secondary | ICD-10-CM | POA: Diagnosis not present

## 2013-01-13 DIAGNOSIS — M9981 Other biomechanical lesions of cervical region: Secondary | ICD-10-CM | POA: Diagnosis not present

## 2013-01-15 DIAGNOSIS — M542 Cervicalgia: Secondary | ICD-10-CM | POA: Diagnosis not present

## 2013-01-15 DIAGNOSIS — M5412 Radiculopathy, cervical region: Secondary | ICD-10-CM | POA: Diagnosis not present

## 2013-01-15 DIAGNOSIS — M9981 Other biomechanical lesions of cervical region: Secondary | ICD-10-CM | POA: Diagnosis not present

## 2013-01-21 DIAGNOSIS — M5412 Radiculopathy, cervical region: Secondary | ICD-10-CM | POA: Diagnosis not present

## 2013-01-21 DIAGNOSIS — M542 Cervicalgia: Secondary | ICD-10-CM | POA: Diagnosis not present

## 2013-01-21 DIAGNOSIS — M9981 Other biomechanical lesions of cervical region: Secondary | ICD-10-CM | POA: Diagnosis not present

## 2013-02-16 DIAGNOSIS — Z96649 Presence of unspecified artificial hip joint: Secondary | ICD-10-CM | POA: Diagnosis not present

## 2013-02-16 DIAGNOSIS — M169 Osteoarthritis of hip, unspecified: Secondary | ICD-10-CM | POA: Diagnosis not present

## 2013-05-19 DIAGNOSIS — I10 Essential (primary) hypertension: Secondary | ICD-10-CM | POA: Diagnosis not present

## 2013-05-19 DIAGNOSIS — E785 Hyperlipidemia, unspecified: Secondary | ICD-10-CM | POA: Diagnosis not present

## 2013-05-28 DIAGNOSIS — E785 Hyperlipidemia, unspecified: Secondary | ICD-10-CM | POA: Diagnosis not present

## 2013-05-28 DIAGNOSIS — J45909 Unspecified asthma, uncomplicated: Secondary | ICD-10-CM | POA: Diagnosis not present

## 2013-05-28 DIAGNOSIS — I10 Essential (primary) hypertension: Secondary | ICD-10-CM | POA: Diagnosis not present

## 2013-05-28 DIAGNOSIS — F17200 Nicotine dependence, unspecified, uncomplicated: Secondary | ICD-10-CM | POA: Diagnosis not present

## 2013-05-28 DIAGNOSIS — M1990 Unspecified osteoarthritis, unspecified site: Secondary | ICD-10-CM | POA: Diagnosis not present

## 2013-05-28 DIAGNOSIS — Z0000 Encounter for general adult medical examination without abnormal findings: Secondary | ICD-10-CM | POA: Diagnosis not present

## 2013-06-01 DIAGNOSIS — Z1212 Encounter for screening for malignant neoplasm of rectum: Secondary | ICD-10-CM | POA: Diagnosis not present

## 2013-06-02 DIAGNOSIS — Z780 Asymptomatic menopausal state: Secondary | ICD-10-CM | POA: Diagnosis not present

## 2013-06-02 HISTORY — PX: OTHER SURGICAL HISTORY: SHX169

## 2013-08-04 DIAGNOSIS — Z1231 Encounter for screening mammogram for malignant neoplasm of breast: Secondary | ICD-10-CM | POA: Diagnosis not present

## 2013-08-06 DIAGNOSIS — Z23 Encounter for immunization: Secondary | ICD-10-CM | POA: Diagnosis not present

## 2013-08-17 DIAGNOSIS — M25559 Pain in unspecified hip: Secondary | ICD-10-CM | POA: Diagnosis not present

## 2013-08-17 DIAGNOSIS — Z96649 Presence of unspecified artificial hip joint: Secondary | ICD-10-CM | POA: Diagnosis not present

## 2013-08-17 DIAGNOSIS — M169 Osteoarthritis of hip, unspecified: Secondary | ICD-10-CM | POA: Diagnosis not present

## 2013-09-02 DIAGNOSIS — D239 Other benign neoplasm of skin, unspecified: Secondary | ICD-10-CM | POA: Diagnosis not present

## 2013-09-02 DIAGNOSIS — D2360 Other benign neoplasm of skin of unspecified upper limb, including shoulder: Secondary | ICD-10-CM | POA: Diagnosis not present

## 2013-09-02 DIAGNOSIS — D485 Neoplasm of uncertain behavior of skin: Secondary | ICD-10-CM | POA: Diagnosis not present

## 2013-11-23 DIAGNOSIS — F17200 Nicotine dependence, unspecified, uncomplicated: Secondary | ICD-10-CM | POA: Diagnosis not present

## 2013-11-23 DIAGNOSIS — M1990 Unspecified osteoarthritis, unspecified site: Secondary | ICD-10-CM | POA: Diagnosis not present

## 2013-11-23 DIAGNOSIS — Z1331 Encounter for screening for depression: Secondary | ICD-10-CM | POA: Diagnosis not present

## 2013-11-23 DIAGNOSIS — J45909 Unspecified asthma, uncomplicated: Secondary | ICD-10-CM | POA: Diagnosis not present

## 2013-11-23 DIAGNOSIS — I10 Essential (primary) hypertension: Secondary | ICD-10-CM | POA: Diagnosis not present

## 2013-11-23 DIAGNOSIS — E785 Hyperlipidemia, unspecified: Secondary | ICD-10-CM | POA: Diagnosis not present

## 2013-11-23 DIAGNOSIS — IMO0002 Reserved for concepts with insufficient information to code with codable children: Secondary | ICD-10-CM | POA: Diagnosis not present

## 2014-03-29 DIAGNOSIS — J069 Acute upper respiratory infection, unspecified: Secondary | ICD-10-CM | POA: Diagnosis not present

## 2014-03-29 DIAGNOSIS — R059 Cough, unspecified: Secondary | ICD-10-CM | POA: Diagnosis not present

## 2014-03-29 DIAGNOSIS — R05 Cough: Secondary | ICD-10-CM | POA: Diagnosis not present

## 2014-03-29 DIAGNOSIS — J45909 Unspecified asthma, uncomplicated: Secondary | ICD-10-CM | POA: Diagnosis not present

## 2014-04-01 DIAGNOSIS — H2510 Age-related nuclear cataract, unspecified eye: Secondary | ICD-10-CM | POA: Diagnosis not present

## 2014-06-03 DIAGNOSIS — Z79899 Other long term (current) drug therapy: Secondary | ICD-10-CM | POA: Diagnosis not present

## 2014-06-03 DIAGNOSIS — I10 Essential (primary) hypertension: Secondary | ICD-10-CM | POA: Diagnosis not present

## 2014-06-03 DIAGNOSIS — E785 Hyperlipidemia, unspecified: Secondary | ICD-10-CM | POA: Diagnosis not present

## 2014-06-10 DIAGNOSIS — Z0000 Encounter for general adult medical examination without abnormal findings: Secondary | ICD-10-CM | POA: Diagnosis not present

## 2014-06-10 DIAGNOSIS — I10 Essential (primary) hypertension: Secondary | ICD-10-CM | POA: Diagnosis not present

## 2014-06-10 DIAGNOSIS — M1990 Unspecified osteoarthritis, unspecified site: Secondary | ICD-10-CM | POA: Diagnosis not present

## 2014-06-10 DIAGNOSIS — J45909 Unspecified asthma, uncomplicated: Secondary | ICD-10-CM | POA: Diagnosis not present

## 2014-06-10 DIAGNOSIS — IMO0002 Reserved for concepts with insufficient information to code with codable children: Secondary | ICD-10-CM | POA: Diagnosis not present

## 2014-06-10 DIAGNOSIS — Z23 Encounter for immunization: Secondary | ICD-10-CM | POA: Diagnosis not present

## 2014-06-10 DIAGNOSIS — F17200 Nicotine dependence, unspecified, uncomplicated: Secondary | ICD-10-CM | POA: Diagnosis not present

## 2014-06-10 DIAGNOSIS — E785 Hyperlipidemia, unspecified: Secondary | ICD-10-CM | POA: Diagnosis not present

## 2014-06-11 DIAGNOSIS — Z1212 Encounter for screening for malignant neoplasm of rectum: Secondary | ICD-10-CM | POA: Diagnosis not present

## 2014-07-02 DIAGNOSIS — Z23 Encounter for immunization: Secondary | ICD-10-CM | POA: Diagnosis not present

## 2014-07-13 DIAGNOSIS — H02839 Dermatochalasis of unspecified eye, unspecified eyelid: Secondary | ICD-10-CM | POA: Diagnosis not present

## 2014-07-13 DIAGNOSIS — H25019 Cortical age-related cataract, unspecified eye: Secondary | ICD-10-CM | POA: Diagnosis not present

## 2014-07-13 DIAGNOSIS — H18419 Arcus senilis, unspecified eye: Secondary | ICD-10-CM | POA: Diagnosis not present

## 2014-07-13 DIAGNOSIS — H2510 Age-related nuclear cataract, unspecified eye: Secondary | ICD-10-CM | POA: Diagnosis not present

## 2014-07-13 DIAGNOSIS — H25049 Posterior subcapsular polar age-related cataract, unspecified eye: Secondary | ICD-10-CM | POA: Diagnosis not present

## 2014-08-10 DIAGNOSIS — Z1231 Encounter for screening mammogram for malignant neoplasm of breast: Secondary | ICD-10-CM | POA: Diagnosis not present

## 2014-08-18 DIAGNOSIS — R928 Other abnormal and inconclusive findings on diagnostic imaging of breast: Secondary | ICD-10-CM | POA: Diagnosis not present

## 2014-08-27 DIAGNOSIS — H2512 Age-related nuclear cataract, left eye: Secondary | ICD-10-CM | POA: Diagnosis not present

## 2014-08-27 DIAGNOSIS — Z961 Presence of intraocular lens: Secondary | ICD-10-CM | POA: Diagnosis not present

## 2014-08-27 DIAGNOSIS — H269 Unspecified cataract: Secondary | ICD-10-CM | POA: Diagnosis not present

## 2014-12-08 DIAGNOSIS — L570 Actinic keratosis: Secondary | ICD-10-CM | POA: Diagnosis not present

## 2014-12-08 DIAGNOSIS — D485 Neoplasm of uncertain behavior of skin: Secondary | ICD-10-CM | POA: Diagnosis not present

## 2014-12-08 DIAGNOSIS — L814 Other melanin hyperpigmentation: Secondary | ICD-10-CM | POA: Diagnosis not present

## 2014-12-08 DIAGNOSIS — C44519 Basal cell carcinoma of skin of other part of trunk: Secondary | ICD-10-CM | POA: Diagnosis not present

## 2014-12-15 DIAGNOSIS — Z1389 Encounter for screening for other disorder: Secondary | ICD-10-CM | POA: Diagnosis not present

## 2014-12-15 DIAGNOSIS — E785 Hyperlipidemia, unspecified: Secondary | ICD-10-CM | POA: Diagnosis not present

## 2014-12-15 DIAGNOSIS — I10 Essential (primary) hypertension: Secondary | ICD-10-CM | POA: Diagnosis not present

## 2014-12-15 DIAGNOSIS — J45909 Unspecified asthma, uncomplicated: Secondary | ICD-10-CM | POA: Diagnosis not present

## 2014-12-15 DIAGNOSIS — M1990 Unspecified osteoarthritis, unspecified site: Secondary | ICD-10-CM | POA: Diagnosis not present

## 2014-12-15 DIAGNOSIS — Z720 Tobacco use: Secondary | ICD-10-CM | POA: Diagnosis not present

## 2014-12-15 DIAGNOSIS — Z6823 Body mass index (BMI) 23.0-23.9, adult: Secondary | ICD-10-CM | POA: Diagnosis not present

## 2014-12-30 DIAGNOSIS — C44519 Basal cell carcinoma of skin of other part of trunk: Secondary | ICD-10-CM | POA: Diagnosis not present

## 2015-02-16 DIAGNOSIS — R920 Mammographic microcalcification found on diagnostic imaging of breast: Secondary | ICD-10-CM | POA: Diagnosis not present

## 2015-04-13 DIAGNOSIS — L820 Inflamed seborrheic keratosis: Secondary | ICD-10-CM | POA: Diagnosis not present

## 2015-04-27 DIAGNOSIS — H2511 Age-related nuclear cataract, right eye: Secondary | ICD-10-CM | POA: Diagnosis not present

## 2015-04-27 DIAGNOSIS — H26492 Other secondary cataract, left eye: Secondary | ICD-10-CM | POA: Diagnosis not present

## 2015-06-10 DIAGNOSIS — E785 Hyperlipidemia, unspecified: Secondary | ICD-10-CM | POA: Diagnosis not present

## 2015-06-10 DIAGNOSIS — I10 Essential (primary) hypertension: Secondary | ICD-10-CM | POA: Diagnosis not present

## 2015-06-24 DIAGNOSIS — M1990 Unspecified osteoarthritis, unspecified site: Secondary | ICD-10-CM | POA: Diagnosis not present

## 2015-06-24 DIAGNOSIS — Z1389 Encounter for screening for other disorder: Secondary | ICD-10-CM | POA: Diagnosis not present

## 2015-06-24 DIAGNOSIS — E785 Hyperlipidemia, unspecified: Secondary | ICD-10-CM | POA: Diagnosis not present

## 2015-06-24 DIAGNOSIS — J45909 Unspecified asthma, uncomplicated: Secondary | ICD-10-CM | POA: Diagnosis not present

## 2015-06-24 DIAGNOSIS — Z720 Tobacco use: Secondary | ICD-10-CM | POA: Diagnosis not present

## 2015-06-24 DIAGNOSIS — Z6823 Body mass index (BMI) 23.0-23.9, adult: Secondary | ICD-10-CM | POA: Diagnosis not present

## 2015-06-24 DIAGNOSIS — Z23 Encounter for immunization: Secondary | ICD-10-CM | POA: Diagnosis not present

## 2015-06-24 DIAGNOSIS — Z0000 Encounter for general adult medical examination without abnormal findings: Secondary | ICD-10-CM | POA: Diagnosis not present

## 2015-06-24 DIAGNOSIS — I10 Essential (primary) hypertension: Secondary | ICD-10-CM | POA: Diagnosis not present

## 2015-07-18 DIAGNOSIS — Z1212 Encounter for screening for malignant neoplasm of rectum: Secondary | ICD-10-CM | POA: Diagnosis not present

## 2015-08-05 DIAGNOSIS — D126 Benign neoplasm of colon, unspecified: Secondary | ICD-10-CM | POA: Diagnosis not present

## 2015-08-05 DIAGNOSIS — Z09 Encounter for follow-up examination after completed treatment for conditions other than malignant neoplasm: Secondary | ICD-10-CM | POA: Diagnosis not present

## 2015-08-05 DIAGNOSIS — D123 Benign neoplasm of transverse colon: Secondary | ICD-10-CM | POA: Diagnosis not present

## 2015-08-05 DIAGNOSIS — Z86010 Personal history of colonic polyps: Secondary | ICD-10-CM | POA: Diagnosis not present

## 2015-08-05 DIAGNOSIS — K5520 Angiodysplasia of colon without hemorrhage: Secondary | ICD-10-CM | POA: Diagnosis not present

## 2015-08-05 DIAGNOSIS — K5730 Diverticulosis of large intestine without perforation or abscess without bleeding: Secondary | ICD-10-CM | POA: Diagnosis not present

## 2015-08-05 HISTORY — PX: COLONOSCOPY: SHX174

## 2015-08-22 DIAGNOSIS — R920 Mammographic microcalcification found on diagnostic imaging of breast: Secondary | ICD-10-CM | POA: Diagnosis not present

## 2015-12-23 DIAGNOSIS — J45909 Unspecified asthma, uncomplicated: Secondary | ICD-10-CM | POA: Diagnosis not present

## 2015-12-23 DIAGNOSIS — M1990 Unspecified osteoarthritis, unspecified site: Secondary | ICD-10-CM | POA: Diagnosis not present

## 2015-12-23 DIAGNOSIS — Z720 Tobacco use: Secondary | ICD-10-CM | POA: Diagnosis not present

## 2015-12-23 DIAGNOSIS — R05 Cough: Secondary | ICD-10-CM | POA: Diagnosis not present

## 2015-12-23 DIAGNOSIS — I10 Essential (primary) hypertension: Secondary | ICD-10-CM | POA: Diagnosis not present

## 2015-12-23 DIAGNOSIS — R928 Other abnormal and inconclusive findings on diagnostic imaging of breast: Secondary | ICD-10-CM | POA: Diagnosis not present

## 2015-12-23 DIAGNOSIS — M549 Dorsalgia, unspecified: Secondary | ICD-10-CM | POA: Diagnosis not present

## 2015-12-23 DIAGNOSIS — E784 Other hyperlipidemia: Secondary | ICD-10-CM | POA: Diagnosis not present

## 2015-12-23 DIAGNOSIS — Z1389 Encounter for screening for other disorder: Secondary | ICD-10-CM | POA: Diagnosis not present

## 2015-12-23 DIAGNOSIS — Z6822 Body mass index (BMI) 22.0-22.9, adult: Secondary | ICD-10-CM | POA: Diagnosis not present

## 2016-03-20 DIAGNOSIS — S61212A Laceration without foreign body of right middle finger without damage to nail, initial encounter: Secondary | ICD-10-CM | POA: Diagnosis not present

## 2016-04-10 DIAGNOSIS — L570 Actinic keratosis: Secondary | ICD-10-CM | POA: Diagnosis not present

## 2016-04-10 DIAGNOSIS — D485 Neoplasm of uncertain behavior of skin: Secondary | ICD-10-CM | POA: Diagnosis not present

## 2016-04-10 DIAGNOSIS — L821 Other seborrheic keratosis: Secondary | ICD-10-CM | POA: Diagnosis not present

## 2016-04-10 DIAGNOSIS — D225 Melanocytic nevi of trunk: Secondary | ICD-10-CM | POA: Diagnosis not present

## 2016-04-10 DIAGNOSIS — D239 Other benign neoplasm of skin, unspecified: Secondary | ICD-10-CM | POA: Diagnosis not present

## 2016-06-25 DIAGNOSIS — H2511 Age-related nuclear cataract, right eye: Secondary | ICD-10-CM | POA: Diagnosis not present

## 2016-06-25 DIAGNOSIS — H26492 Other secondary cataract, left eye: Secondary | ICD-10-CM | POA: Diagnosis not present

## 2016-06-27 DIAGNOSIS — I10 Essential (primary) hypertension: Secondary | ICD-10-CM | POA: Diagnosis not present

## 2016-06-27 DIAGNOSIS — E784 Other hyperlipidemia: Secondary | ICD-10-CM | POA: Diagnosis not present

## 2016-07-02 DIAGNOSIS — H0014 Chalazion left upper eyelid: Secondary | ICD-10-CM | POA: Diagnosis not present

## 2016-07-13 DIAGNOSIS — M1990 Unspecified osteoarthritis, unspecified site: Secondary | ICD-10-CM | POA: Diagnosis not present

## 2016-07-13 DIAGNOSIS — Z720 Tobacco use: Secondary | ICD-10-CM | POA: Diagnosis not present

## 2016-07-13 DIAGNOSIS — M549 Dorsalgia, unspecified: Secondary | ICD-10-CM | POA: Diagnosis not present

## 2016-07-13 DIAGNOSIS — I10 Essential (primary) hypertension: Secondary | ICD-10-CM | POA: Diagnosis not present

## 2016-07-13 DIAGNOSIS — E784 Other hyperlipidemia: Secondary | ICD-10-CM | POA: Diagnosis not present

## 2016-07-13 DIAGNOSIS — Z0000 Encounter for general adult medical examination without abnormal findings: Secondary | ICD-10-CM | POA: Diagnosis not present

## 2016-07-13 DIAGNOSIS — R05 Cough: Secondary | ICD-10-CM | POA: Diagnosis not present

## 2016-07-13 DIAGNOSIS — J45909 Unspecified asthma, uncomplicated: Secondary | ICD-10-CM | POA: Diagnosis not present

## 2016-07-13 DIAGNOSIS — Z6822 Body mass index (BMI) 22.0-22.9, adult: Secondary | ICD-10-CM | POA: Diagnosis not present

## 2016-07-13 DIAGNOSIS — Z23 Encounter for immunization: Secondary | ICD-10-CM | POA: Diagnosis not present

## 2016-07-13 DIAGNOSIS — Z1389 Encounter for screening for other disorder: Secondary | ICD-10-CM | POA: Diagnosis not present

## 2016-07-18 DIAGNOSIS — Z1212 Encounter for screening for malignant neoplasm of rectum: Secondary | ICD-10-CM | POA: Diagnosis not present

## 2016-07-28 HISTORY — PX: DIAGNOSTIC MAMMOGRAM: HXRAD719

## 2016-08-15 ENCOUNTER — Other Ambulatory Visit: Payer: Self-pay | Admitting: Internal Medicine

## 2016-08-15 DIAGNOSIS — R059 Cough, unspecified: Secondary | ICD-10-CM

## 2016-08-15 DIAGNOSIS — R05 Cough: Secondary | ICD-10-CM

## 2016-08-15 DIAGNOSIS — J45909 Unspecified asthma, uncomplicated: Secondary | ICD-10-CM

## 2016-08-17 ENCOUNTER — Ambulatory Visit
Admission: RE | Admit: 2016-08-17 | Discharge: 2016-08-17 | Disposition: A | Discharge status: Home / Self Care | Payer: Medicare Other | Source: Ambulatory Visit | Attending: Internal Medicine | Admitting: Internal Medicine

## 2016-08-17 DIAGNOSIS — J45909 Unspecified asthma, uncomplicated: Secondary | ICD-10-CM

## 2016-08-17 DIAGNOSIS — R05 Cough: Secondary | ICD-10-CM

## 2016-08-17 DIAGNOSIS — R059 Cough, unspecified: Secondary | ICD-10-CM

## 2016-08-17 DIAGNOSIS — J449 Chronic obstructive pulmonary disease, unspecified: Secondary | ICD-10-CM | POA: Diagnosis not present

## 2016-08-20 ENCOUNTER — Other Ambulatory Visit (HOSPITAL_COMMUNITY): Payer: Self-pay | Admitting: Internal Medicine

## 2016-08-20 DIAGNOSIS — R918 Other nonspecific abnormal finding of lung field: Secondary | ICD-10-CM

## 2016-08-21 ENCOUNTER — Ambulatory Visit (INDEPENDENT_AMBULATORY_CARE_PROVIDER_SITE_OTHER): Payer: Medicare Other | Admitting: Emergency Medicine

## 2016-08-21 ENCOUNTER — Encounter: Payer: Self-pay | Admitting: Emergency Medicine

## 2016-08-21 VITALS — BP 124/64 | HR 77 | Ht 63.5 in | Wt 130.0 lb

## 2016-08-21 DIAGNOSIS — R0602 Shortness of breath: Secondary | ICD-10-CM | POA: Insufficient documentation

## 2016-08-21 DIAGNOSIS — R918 Other nonspecific abnormal finding of lung field: Secondary | ICD-10-CM | POA: Diagnosis not present

## 2016-08-21 DIAGNOSIS — R0609 Other forms of dyspnea: Secondary | ICD-10-CM

## 2016-08-21 DIAGNOSIS — F17200 Nicotine dependence, unspecified, uncomplicated: Secondary | ICD-10-CM

## 2016-08-21 NOTE — Patient Instructions (Signed)
We will arrange for navigational bronchoscopy to evaluate your right upper lobe nodule.  We will arrange for pulmonary function testing.  Follow with Dr Lamonte Sakai in 1 month

## 2016-08-21 NOTE — Progress Notes (Signed)
Subjective:    Patient ID: [name], female    DOB: 01-Aug-1946, 70 y.o.   MRN: [id number]  HPI 70 year old woman with a history of tobacco use (45 pack years), hypertension, hyperlipidemia, allergic rhinitis. She carries a diagnosis of asthma made by about 6-8 yrs ago in setting URI. She has occasional wheeze, usually in setting of apparent AE's for which she has been rx with pred and abx. She is followed by Dr Reynaldo Minium, had slow progressive exertional dyspnea, underwent a CT scan of her chest 08/17/16 that shows a rounded RUL nodule with some spiculation. No cough, no sputum or hemoptysis. No CP or pleuritic sx.    Review of Systems  Constitutional: Negative for fever and unexpected weight change.  HENT: Positive for congestion. Negative for dental problem, ear pain, nosebleeds, postnasal drip, rhinorrhea, sinus pressure, sneezing, sore throat and trouble swallowing.   Eyes: Negative for redness and itching.  Respiratory: Positive for shortness of breath. Negative for cough, chest tightness and wheezing.   Cardiovascular: Negative for palpitations and leg swelling.  Gastrointestinal: Negative for nausea and vomiting.  Genitourinary: Negative for dysuria.  Musculoskeletal: Negative for joint swelling.  Skin: Negative for rash.  Neurological: Negative for headaches.  Hematological: Does not bruise/bleed easily.  Psychiatric/Behavioral: Negative for dysphoric mood. The patient is not nervous/anxious.    Past Medical History:  Diagnosis Date  . Arthritis   . Asthma    asthmatic bronchitis  . Hyperlipidemia   . Hypertension   . PONV (postoperative nausea and vomiting)      No family history on file.   Social History   Social History  . Marital status: Married    Spouse name: N/A  . Number of children: N/A  . Years of education: N/A   Occupational History  . Not on file.   Social History Main Topics  . Smoking status: Current Every Day Smoker    Packs/day: 0.50  .  Smokeless tobacco: Not on file  . Alcohol use No  . Drug use: No  . Sexual activity: Not on file   Other Topics Concern  . Not on file   Social History Narrative  . No narrative on file     No Known Allergies   Outpatient Medications Prior to Visit  Medication Sig Dispense Refill  . albuterol (PROVENTIL HFA;VENTOLIN HFA) 108 (90 BASE) MCG/ACT inhaler Inhale 2 puffs into the lungs every 6 (six) hours as needed. For shortness of breath    . aspirin EC 325 MG EC tablet Take 1 tablet (325 mg total) by mouth 2 (two) times daily. 60 tablet 0  . Calcium Carbonate-Vitamin D (CALCIUM 600 + D PO) Take 1 tablet by mouth 2 (two) times daily.    [name] diltiazem (TIAZAC) 300 MG 24 hr capsule Take 300 mg by mouth every morning.    . diphenhydrAMINE (BENADRYL) 25 mg capsule Take 50 mg by mouth every 6 (six) hours as needed. For allergies    . fluticasone (FLONASE) 50 MCG/ACT nasal spray Place 2 sprays into the nose daily as needed. For nasal decongestant    . simvastatin (ZOCOR) 20 MG tablet Take 20 mg by mouth every morning.    . sodium chloride (OCEAN) 0.65 % nasal spray Place 1 spray into the nose daily as needed. For nasal decongestant    . telmisartan (MICARDIS) 40 MG tablet Take 40 mg by mouth every morning.    . triamterene-hydrochlorothiazide (MAXZIDE-25) 37.5-25 MG per tablet Take 1 tablet by mouth  every morning.    . zolpidem (AMBIEN) 10 MG tablet Take 10 mg by mouth at bedtime as needed. For sleep    . docusate sodium 100 MG CAPS Take 100 mg by mouth 2 (two) times daily. (Patient not taking: Reported on 08/21/2016) 10 capsule 0  . guaiFENesin (MUCINEX) 600 MG 12 hr tablet Take 600 mg by mouth 2 (two) times daily.    . traMADol (ULTRAM) 50 MG tablet Take 50-100 mg by mouth every 8 (eight) hours as needed. For pain     No facility-administered medications prior to visit.         Objective:   Physical Exam Vitals:   08/21/16 1530  BP: 124/64  Pulse: 77  SpO2: 96%  Weight: 130 lb (59 kg)   Height: 5' 3.5" (1.613 m)   Gen: Pleasant, well-nourished, in no distress,  normal affect  ENT: No lesions,  mouth clear,  oropharynx clear, no postnasal drip  Neck: No JVD, no TMG, no carotid bruits  Lungs: No use of accessory muscles, R basilar insp crackles  Cardiovascular: RRR, heart sounds normal, no murmur or gallops, no peripheral edema  Musculoskeletal: No deformities, no cyanosis or clubbing  Neuro: alert, non focal  Skin: Warm, no lesions or rashes       Assessment & Plan:  Mass of upper lobe of right lung Right upper lobe mass discovered spuriously by CT scan of the chest and a longtime smoker. High likelihood for primary lung malignancy. I believe the best approach for tissue diagnosis is navigational bronchoscopy. I discussed the pros and cons of this modality with her. Discussed the risks and benefits. She agrees to proceed. We will work on obtaining CT chest Superd cuts, perform asap.   Tobacco use disorder Discussed cessation with her today.  Dyspnea on exertion BD with likely progressive symptoms. In the past she's only been symptomatic in the setting of exacerbations. She is now having progressive dyspnea. She needs full pulmonary function testing.  Baltazar Apo, MD, PhD 08/21/2016, 4:07 PM [facility] [phone] or if no answer [phone]

## 2016-08-21 NOTE — Assessment & Plan Note (Signed)
BD with likely progressive symptoms. In the past she's only been symptomatic in the setting of exacerbations. She is now having progressive dyspnea. She needs full pulmonary function testing.

## 2016-08-21 NOTE — Assessment & Plan Note (Signed)
Discussed cessation with her today.

## 2016-08-21 NOTE — Assessment & Plan Note (Signed)
Right upper lobe mass discovered spuriously by CT scan of the chest and a longtime smoker. High likelihood for primary lung malignancy. I believe the best approach for tissue diagnosis is navigational bronchoscopy. I discussed the pros and cons of this modality with her. Discussed the risks and benefits. She agrees to proceed. We will work on obtaining CT chest Superd cuts, perform asap.

## 2016-08-24 ENCOUNTER — Ambulatory Visit (HOSPITAL_COMMUNITY)
Admission: RE | Admit: 2016-08-24 | Discharge: 2016-08-24 | Disposition: A | Discharge status: Home / Self Care | Payer: Medicare Other | Source: Ambulatory Visit | Attending: Emergency Medicine | Admitting: Emergency Medicine

## 2016-08-24 DIAGNOSIS — F17200 Nicotine dependence, unspecified, uncomplicated: Secondary | ICD-10-CM | POA: Insufficient documentation

## 2016-08-24 DIAGNOSIS — J449 Chronic obstructive pulmonary disease, unspecified: Secondary | ICD-10-CM | POA: Diagnosis not present

## 2016-08-24 LAB — PULMONARY FUNCTION TEST
DL/VA % pred: 66 %
DL/VA: 3.12 ml/min/mmHg/L
DLCO unc % pred: 52 %
DLCO unc: 12.02 ml/min/mmHg
FEF 25-75 PRE: 0.44 L/s
FEF 25-75 Post: 0.64 L/sec
FEF2575-%Change-Post: 45 %
FEF2575-%PRED-PRE: 24 %
FEF2575-%Pred-Post: 36 %
FEV1-%Change-Post: 10 %
FEV1-%PRED-POST: 62 %
FEV1-%Pred-Pre: 57 %
FEV1-PRE: 1.22 L
FEV1-Post: 1.35 L
FEV1FVC-%Change-Post: 0 %
FEV1FVC-%Pred-Pre: 63 %
FEV6-%CHANGE-POST: 8 %
FEV6-%PRED-POST: 92 %
FEV6-%PRED-PRE: 85 %
FEV6-POST: 2.5 L
FEV6-Pre: 2.3 L
FEV6FVC-%Change-Post: 0 %
FEV6FVC-%PRED-POST: 94 %
FEV6FVC-%Pred-Pre: 95 %
FVC-%Change-Post: 9 %
FVC-%Pred-Post: 98 %
FVC-%Pred-Pre: 89 %
FVC-POST: 2.79 L
FVC-Pre: 2.54 L
POST FEV6/FVC RATIO: 90 %
PRE FEV1/FVC RATIO: 48 %
Post FEV1/FVC ratio: 48 %
Pre FEV6/FVC Ratio: 90 %
RV % pred: 155 %
RV: 3.37 L
TLC % PRED: 120 %
TLC: 5.92 L

## 2016-08-24 MED ORDER — ALBUTEROL SULFATE (2.5 MG/3ML) 0.083% IN NEBU
2.5000 mg | INHALATION_SOLUTION | Freq: Once | RESPIRATORY_TRACT | Status: AC
  Administered 2016-08-24: 2.5 mg via RESPIRATORY_TRACT

## 2016-08-28 ENCOUNTER — Encounter (HOSPITAL_COMMUNITY)
Admission: RE | Admit: 2016-08-28 | Discharge: 2016-08-28 | Disposition: A | Discharge status: Home / Self Care | Payer: Medicare Other | Source: Ambulatory Visit | Attending: Emergency Medicine | Admitting: Emergency Medicine

## 2016-08-28 ENCOUNTER — Encounter (HOSPITAL_COMMUNITY): Payer: Self-pay

## 2016-08-28 DIAGNOSIS — E785 Hyperlipidemia, unspecified: Secondary | ICD-10-CM | POA: Diagnosis not present

## 2016-08-28 DIAGNOSIS — C3411 Malignant neoplasm of upper lobe, right bronchus or lung: Secondary | ICD-10-CM | POA: Diagnosis not present

## 2016-08-28 DIAGNOSIS — F17210 Nicotine dependence, cigarettes, uncomplicated: Secondary | ICD-10-CM | POA: Diagnosis not present

## 2016-08-28 DIAGNOSIS — Z79899 Other long term (current) drug therapy: Secondary | ICD-10-CM | POA: Diagnosis not present

## 2016-08-28 DIAGNOSIS — R921 Mammographic calcification found on diagnostic imaging of breast: Secondary | ICD-10-CM | POA: Diagnosis not present

## 2016-08-28 DIAGNOSIS — Z7982 Long term (current) use of aspirin: Secondary | ICD-10-CM | POA: Diagnosis not present

## 2016-08-28 DIAGNOSIS — I10 Essential (primary) hypertension: Secondary | ICD-10-CM | POA: Diagnosis not present

## 2016-08-28 DIAGNOSIS — R928 Other abnormal and inconclusive findings on diagnostic imaging of breast: Secondary | ICD-10-CM | POA: Diagnosis not present

## 2016-08-28 DIAGNOSIS — R918 Other nonspecific abnormal finding of lung field: Secondary | ICD-10-CM | POA: Diagnosis present

## 2016-08-28 HISTORY — DX: Personal history of urinary calculi: Z87.442

## 2016-08-28 HISTORY — DX: Dyspnea, unspecified: R06.00

## 2016-08-28 HISTORY — DX: Restless legs syndrome: G25.81

## 2016-08-28 HISTORY — DX: Chronic obstructive pulmonary disease, unspecified: J44.9

## 2016-08-28 HISTORY — DX: Pneumonia, unspecified organism: J18.9

## 2016-08-28 LAB — BASIC METABOLIC PANEL
Anion gap: 8 (ref 5–15)
BUN: 17 mg/dL (ref 6–20)
CHLORIDE: 101 mmol/L (ref 101–111)
CO2: 29 mmol/L (ref 22–32)
Calcium: 9.7 mg/dL (ref 8.9–10.3)
Creatinine, Ser: 1.03 mg/dL — ABNORMAL HIGH (ref 0.44–1.00)
GFR calc Af Amer: >60 mL/min (ref >60)
GFR, EST NON AFRICAN AMERICAN: 53 mL/min — AB (ref >60)
GLUCOSE: 92 mg/dL (ref 65–99)
POTASSIUM: 3.6 mmol/L (ref 3.5–5.1)
Sodium: 138 mmol/L (ref 135–145)

## 2016-08-28 LAB — CBC
HEMATOCRIT: 40.8 % (ref 36.0–46.0)
Hemoglobin: 14.1 g/dL (ref 12.0–15.0)
MCH: 31.3 pg (ref 26.0–34.0)
MCHC: 34.6 g/dL (ref 30.0–36.0)
MCV: 90.5 fL (ref 78.0–100.0)
Platelets: 268 10*3/uL (ref 150–400)
RBC: 4.51 MIL/uL (ref 3.87–5.11)
RDW: 13.4 % (ref 11.5–15.5)
WBC: 9.3 10*3/uL (ref 4.0–10.5)

## 2016-08-28 NOTE — Pre-Procedure Instructions (Signed)
[name]  08/28/2016    Your procedure is scheduled on Wednesday, August 29, 2016 at 10:00 AM.   Report to [facility] Entrance "A" Admitting Office at 8:00 AM.   Call this number if you have problems the morning of surgery: [phone]   Remember:  Do not eat food or drink liquids after midnight tonight.   Take these medicines the morning of surgery with A SIP OF WATER: Diltiazem (Tiazac), Loratadine (Claritin) - if needed, Flonase - if needed, Albuterol inhaler - if needed (bring with you in the AM)   Do not wear jewelry, make-up or nail polish.  Do not wear lotions, powders, or perfumes.  Do not shave 48 hours prior to surgery.    Do not bring valuables to the hospital.  [facility] is not responsible for any belongings or valuables.  Contacts, dentures or bridgework may not be worn into surgery.  Leave your suitcase in the car.  After surgery it may be brought to your room.  For patients admitted to the hospital, discharge time will be determined by your treatment team.  Patients discharged the day of surgery will not be allowed to drive home.   Special instructions:  [facility] - Preparing for Surgery  Before surgery, you can play an important role.  Because skin is not sterile, your skin needs to be as free of germs as possible.  You can reduce the number of germs on you skin by washing with CHG (chlorahexidine gluconate) soap before surgery.  CHG is an antiseptic cleaner which kills germs and bonds with the skin to continue killing germs even after washing.  Please DO NOT use if you have an allergy to CHG or antibacterial soaps.  If your skin becomes reddened/irritated stop using the CHG and inform your nurse when you arrive at Short Stay.  Do not shave (including legs and underarms) for at least 48 hours prior to the first CHG shower.  You may shave your face.  Please follow these instructions carefully:   1.  Shower with CHG Soap the night before  surgery and the                    morning of Surgery.  2.  If you choose to wash your hair, wash your hair first as usual with your       normal shampoo.  3.  After you shampoo, rinse your hair and body thoroughly to remove the shampoo.  4.  Use CHG as you would any other liquid soap.  You can apply chg directly       to the skin and wash gently with scrungie or a clean washcloth.  5.  Apply the CHG Soap to your body ONLY FROM THE NECK DOWN.        Do not use on open wounds or open sores.  Avoid contact with your eyes, ears, mouth and genitals (private parts).  Wash genitals (private parts) with your normal soap.  6.  Wash thoroughly, paying special attention to the area where your surgery        will be performed.  7.  Thoroughly rinse your body with warm water from the neck down.  8.  DO NOT shower/wash with your normal soap after using and rinsing off       the CHG Soap.  9.  Pat yourself dry with a clean towel.  10.  Wear clean pajamas.            11.  Place clean sheets on your bed the night of your first shower and do not        sleep with pets.  Day of Surgery  Do not apply any lotions the morning of surgery.  Please wear clean clothes to the hospital.   Please read over the following fact sheets that you were given.

## 2016-08-28 NOTE — Progress Notes (Signed)
Pt denies cardiac history, chest pain or sob. 

## 2016-08-29 ENCOUNTER — Ambulatory Visit (HOSPITAL_COMMUNITY)
Admission: RE | Admit: 2016-08-29 | Discharge: 2016-08-29 | Disposition: A | Discharge status: Home / Self Care | Payer: Medicare Other | Source: Ambulatory Visit | Attending: Emergency Medicine | Admitting: Emergency Medicine

## 2016-08-29 ENCOUNTER — Ambulatory Visit (HOSPITAL_COMMUNITY): Payer: Medicare Other | Admitting: Certified Registered Nurse Anesthetist

## 2016-08-29 ENCOUNTER — Encounter (HOSPITAL_COMMUNITY): Payer: Self-pay | Admitting: *Deleted

## 2016-08-29 ENCOUNTER — Ambulatory Visit (HOSPITAL_COMMUNITY): Payer: Medicare Other

## 2016-08-29 ENCOUNTER — Encounter (HOSPITAL_COMMUNITY)
Admission: RE | Disposition: A | Discharge status: Home / Self Care | Payer: Self-pay | Source: Ambulatory Visit | Attending: Emergency Medicine

## 2016-08-29 DIAGNOSIS — Z79899 Other long term (current) drug therapy: Secondary | ICD-10-CM | POA: Diagnosis not present

## 2016-08-29 DIAGNOSIS — R0600 Dyspnea, unspecified: Secondary | ICD-10-CM | POA: Diagnosis not present

## 2016-08-29 DIAGNOSIS — Z7982 Long term (current) use of aspirin: Secondary | ICD-10-CM | POA: Diagnosis not present

## 2016-08-29 DIAGNOSIS — C3411 Malignant neoplasm of upper lobe, right bronchus or lung: Secondary | ICD-10-CM | POA: Diagnosis not present

## 2016-08-29 DIAGNOSIS — I10 Essential (primary) hypertension: Secondary | ICD-10-CM | POA: Diagnosis not present

## 2016-08-29 DIAGNOSIS — F17210 Nicotine dependence, cigarettes, uncomplicated: Secondary | ICD-10-CM | POA: Diagnosis not present

## 2016-08-29 DIAGNOSIS — E785 Hyperlipidemia, unspecified: Secondary | ICD-10-CM | POA: Insufficient documentation

## 2016-08-29 DIAGNOSIS — R918 Other nonspecific abnormal finding of lung field: Secondary | ICD-10-CM

## 2016-08-29 DIAGNOSIS — Z419 Encounter for procedure for purposes other than remedying health state, unspecified: Secondary | ICD-10-CM

## 2016-08-29 DIAGNOSIS — M169 Osteoarthritis of hip, unspecified: Secondary | ICD-10-CM | POA: Diagnosis not present

## 2016-08-29 DIAGNOSIS — Z9889 Other specified postprocedural states: Secondary | ICD-10-CM

## 2016-08-29 HISTORY — PX: VIDEO BRONCHOSCOPY WITH ENDOBRONCHIAL NAVIGATION: SHX6175

## 2016-08-29 SURGERY — VIDEO BRONCHOSCOPY WITH ENDOBRONCHIAL NAVIGATION
Anesthesia: General | Laterality: Right

## 2016-08-29 MED ORDER — LIDOCAINE HCL (CARDIAC) 20 MG/ML IV SOLN
INTRAVENOUS | Status: DC | PRN
  Administered 2016-08-29: 60 mg via INTRAVENOUS

## 2016-08-29 MED ORDER — FENTANYL CITRATE (PF) 100 MCG/2ML IJ SOLN
INTRAMUSCULAR | Status: DC | PRN
  Administered 2016-08-29 (×2): 50 ug via INTRAVENOUS

## 2016-08-29 MED ORDER — FENTANYL CITRATE (PF) 100 MCG/2ML IJ SOLN
INTRAMUSCULAR | Status: AC
  Filled 2016-08-29: qty 2

## 2016-08-29 MED ORDER — MIDAZOLAM HCL 2 MG/2ML IJ SOLN
INTRAMUSCULAR | Status: AC
  Filled 2016-08-29: qty 2

## 2016-08-29 MED ORDER — ROCURONIUM BROMIDE 100 MG/10ML IV SOLN
INTRAVENOUS | Status: DC | PRN
  Administered 2016-08-29: 10 mg via INTRAVENOUS
  Administered 2016-08-29: 30 mg via INTRAVENOUS

## 2016-08-29 MED ORDER — ONDANSETRON HCL 4 MG/2ML IJ SOLN
INTRAMUSCULAR | Status: DC | PRN
  Administered 2016-08-29: 4 mg via INTRAVENOUS

## 2016-08-29 MED ORDER — PROPOFOL 10 MG/ML IV BOLUS
INTRAVENOUS | Status: AC
  Filled 2016-08-29: qty 20

## 2016-08-29 MED ORDER — EPHEDRINE SULFATE 50 MG/ML IJ SOLN
INTRAMUSCULAR | Status: DC | PRN
  Administered 2016-08-29 (×2): 10 mg via INTRAVENOUS

## 2016-08-29 MED ORDER — ONDANSETRON HCL 4 MG/2ML IJ SOLN
INTRAMUSCULAR | Status: AC
  Filled 2016-08-29: qty 2

## 2016-08-29 MED ORDER — PHENYLEPHRINE 40 MCG/ML (10ML) SYRINGE FOR IV PUSH (FOR BLOOD PRESSURE SUPPORT)
PREFILLED_SYRINGE | INTRAVENOUS | Status: AC
  Filled 2016-08-29: qty 10

## 2016-08-29 MED ORDER — PHENYLEPHRINE HCL 10 MG/ML IJ SOLN
INTRAMUSCULAR | Status: DC | PRN
  Administered 2016-08-29: 80 ug via INTRAVENOUS
  Administered 2016-08-29: 40 ug via INTRAVENOUS
  Administered 2016-08-29: 80 ug via INTRAVENOUS

## 2016-08-29 MED ORDER — LIDOCAINE 2% (20 MG/ML) 5 ML SYRINGE
INTRAMUSCULAR | Status: AC
  Filled 2016-08-29: qty 10

## 2016-08-29 MED ORDER — FENTANYL CITRATE (PF) 100 MCG/2ML IJ SOLN: 25.0000 ug | INTRAMUSCULAR | Status: DC | PRN

## 2016-08-29 MED ORDER — DEXAMETHASONE SODIUM PHOSPHATE 10 MG/ML IJ SOLN
INTRAMUSCULAR | Status: DC | PRN
  Administered 2016-08-29 (×2): 10 mg via INTRAVENOUS

## 2016-08-29 MED ORDER — MIDAZOLAM HCL 5 MG/5ML IJ SOLN
INTRAMUSCULAR | Status: DC | PRN
  Administered 2016-08-29: 1 mg via INTRAVENOUS

## 2016-08-29 MED ORDER — EPINEPHRINE PF 1 MG/ML IJ SOLN
INTRAMUSCULAR | Status: AC
  Filled 2016-08-29: qty 1

## 2016-08-29 MED ORDER — EPHEDRINE 5 MG/ML INJ
INTRAVENOUS | Status: AC
  Filled 2016-08-29: qty 10

## 2016-08-29 MED ORDER — PROPOFOL 10 MG/ML IV BOLUS
INTRAVENOUS | Status: DC | PRN
  Administered 2016-08-29: 100 mg via INTRAVENOUS
  Administered 2016-08-29: 30 mg via INTRAVENOUS

## 2016-08-29 MED ORDER — 0.9 % SODIUM CHLORIDE (POUR BTL) OPTIME
TOPICAL | Status: DC | PRN
  Administered 2016-08-29: 1000 mL

## 2016-08-29 MED ORDER — SUGAMMADEX SODIUM 200 MG/2ML IV SOLN
INTRAVENOUS | Status: DC | PRN
  Administered 2016-08-29: 150 mg via INTRAVENOUS

## 2016-08-29 MED ORDER — LACTATED RINGERS IV SOLN
INTRAVENOUS | Status: DC
  Administered 2016-08-29 (×3): via INTRAVENOUS

## 2016-08-29 SURGICAL SUPPLY — 42 items
ADAPTER BRONCH F/PENTAX (ADAPTER) ×4 IMPLANT
ADPR BSCP EDG PNTX (ADAPTER) ×2
BRUSH CYTOL CELLEBRITY 1.5X140 (MISCELLANEOUS) ×4 IMPLANT
BRUSH SUPERTRAX BIOPSY (INSTRUMENTS) IMPLANT
BRUSH SUPERTRAX NDL-TIP CYTO (INSTRUMENTS) ×2 IMPLANT
CANISTER SUCTION 2500CC (MISCELLANEOUS) ×4 IMPLANT
CHANNEL WORK EXTEND EDGE 180 (KITS) IMPLANT
CHANNEL WORK EXTEND EDGE 45 (KITS) IMPLANT
CHANNEL WORK EXTEND EDGE 90 (KITS) IMPLANT
CONT SPEC 4OZ CLIKSEAL STRL BL (MISCELLANEOUS) ×4 IMPLANT
COVER TABLE BACK 60X90 (DRAPES) ×4 IMPLANT
FILTER STRAW FLUID ASPIR (MISCELLANEOUS) IMPLANT
FORCEPS BIOP SUPERTRX PREMAR (INSTRUMENTS) ×3 IMPLANT
GAUZE SPONGE 4X4 12PLY STRL (GAUZE/BANDAGES/DRESSINGS) ×4 IMPLANT
GLOVE BIO SURGEON STRL SZ7.5 (GLOVE) ×8 IMPLANT
GLOVE SURG SS PI 7.0 STRL IVOR (GLOVE) ×2 IMPLANT
GOWN STRL REUS W/ TWL XL LVL3 (GOWN DISPOSABLE) ×2 IMPLANT
GOWN STRL REUS W/TWL XL LVL3 (GOWN DISPOSABLE) ×8
KIT CLEAN ENDO COMPLIANCE (KITS) ×4 IMPLANT
KIT LOCATABLE GUIDE (CANNULA) IMPLANT
KIT MARKER FIDUCIAL DELIVERY (KITS) ×4 IMPLANT
KIT PROCEDURE EDGE 180 (KITS) IMPLANT
KIT PROCEDURE EDGE 45 (KITS) IMPLANT
KIT PROCEDURE EDGE 90 (KITS) IMPLANT
KIT ROOM TURNOVER OR (KITS) ×4 IMPLANT
MARKER FIDUCIAL SL NIT COIL (Implant Marker) ×9 IMPLANT
MARKER SKIN DUAL TIP RULER LAB (MISCELLANEOUS) ×4 IMPLANT
NDL SUPERTRX PREMARK BIOPSY (NEEDLE) IMPLANT
NEEDLE 22X1 1/2 (OR ONLY) (NEEDLE) ×3 IMPLANT
NEEDLE SUPERTRX PREMARK BIOPSY (NEEDLE) ×4 IMPLANT
NS IRRIG 1000ML POUR BTL (IV SOLUTION) ×4 IMPLANT
OIL SILICONE PENTAX (PARTS (SERVICE/REPAIRS)) ×4 IMPLANT
PAD ARMBOARD 7.5X6 YLW CONV (MISCELLANEOUS) ×8 IMPLANT
PATCHES PATIENT (LABEL) ×4 IMPLANT
SYR 20CC LL (SYRINGE) ×4 IMPLANT
SYR 20ML ECCENTRIC (SYRINGE) ×4 IMPLANT
SYR 50ML SLIP (SYRINGE) ×4 IMPLANT
TOWEL OR 17X24 6PK STRL BLUE (TOWEL DISPOSABLE) ×4 IMPLANT
TRAP SPECIMEN MUCOUS 40CC (MISCELLANEOUS) IMPLANT
TUBE CONNECTING 20'X1/4 (TUBING) ×1
TUBE CONNECTING 20X1/4 (TUBING) ×3 IMPLANT
WATER STERILE IRR 1000ML POUR (IV SOLUTION) ×4 IMPLANT

## 2016-08-29 NOTE — Interval H&P Note (Signed)
PCCM Interval Note  Pt presents today for further workup of her RUL spiculated nodule. She reports no new problem. No CP, cough hemoptysis. She does use albuterol prn.   Vitals:   08/29/16 0805  BP: 125/87  Pulse: 66  Resp: 18  Temp: 97.9 F (36.6 C)  Gen: Pleasant, well-nourished, in no distress,  normal affect  ENT: No lesions,  mouth clear,  oropharynx clear, no postnasal drip  Neck: No JVD, no TMG, no carotid bruits  Lungs: No use of accessory muscles, distant, clear without rales or rhonchi  Cardiovascular: RRR, heart sounds normal, no murmur or gallops, no peripheral edema  Musculoskeletal: No deformities, no cyanosis or clubbing  Neuro: alert, non focal  Skin: Warm, no lesions or rashes    Recent Labs Lab 08/28/16 1520  HGB 14.1  HCT 40.8  WBC 9.3  PLT 268    Recent Labs Lab 08/28/16 1520  NA 138  K 3.6  CL 101  CO2 29  GLUCOSE 92  BUN 17  CREATININE 1.03*  CALCIUM 9.7   Impression:  RUL spiculated nodule in smoker, probably primary lung cancer.   Plan:  Will proceed with navigational bronchoscopy for biopsies and probably for fiducial placement. Pt understands the procedure, risks, benefits. She elects to proceed.   Baltazar Apo, MD, PhD 08/29/2016, 8:39 AM [facility] [phone] or if no answer [phone]

## 2016-08-29 NOTE — Discharge Instructions (Signed)
Flexible Bronchoscopy, Care After °These instructions give you information on caring for yourself after your procedure. Your doctor may also give you more specific instructions. Call your doctor if you have any problems or questions after your procedure. °Follow these instructions at home: °· Do not eat or drink anything for 2 hours after your procedure. If you try to eat or drink before the medicine wears off, food or drink could go into your lungs. You could also burn yourself. °· After 2 hours have passed and when you can cough and gag normally, you may eat soft food and drink liquids slowly. °· The day after the test, you may eat your normal diet. °· You may do your normal activities. °· Keep all doctor visits. °Get help right away if: °· You get more and more short of breath. °· You get light-headed. °· You feel like you are going to pass out (faint). °· You have chest pain. °· You have new problems that worry you. °· You cough up more than a little blood. °· You cough up more blood than before. ° °PLEASE CALL OUR OFFICE FOR ANY QUESTIONS OR CONCERNS. [phone].  ° °This information is not intended to replace advice given to you by your health care provider. Make sure you discuss any questions you have with your health care provider. °Document Released: 07/29/2009 Document Revised: 03/08/2016 Document Reviewed: 06/05/2013 °Elsevier Interactive Patient Education © 2017 [employer] ° °

## 2016-08-29 NOTE — Anesthesia Postprocedure Evaluation (Signed)
Anesthesia Post Note  Patient: [name]  Procedure(s) Performed: Procedure(s) (LRB): VIDEO BRONCHOSCOPY WITH ENDOBRONCHIAL NAVIGATION (N/A) PLACEMENT OF FIDUCIAL MARKERS TIMES 3, RIGHT UPPER LOBE LUNG (Right)  Patient location during evaluation: PACU Anesthesia Type: General Level of consciousness: awake Pain management: pain level controlled Vital Signs Assessment: post-procedure vital signs reviewed and stable Respiratory status: spontaneous breathing Cardiovascular status: stable Anesthetic complications: no    Last Vitals:  Vitals:   08/29/16 0805 08/29/16 1145  BP: 125/87 95/75  Pulse: 66 91  Resp: 18 20  Temp: 36.6 C 36.4 C    Last Pain:  Vitals:   08/29/16 0805  TempSrc: Oral                 EDWARDS,Cathy Ropp

## 2016-08-29 NOTE — Transfer of Care (Signed)
Immediate Anesthesia Transfer of Care Note  Patient: [name]  Procedure(s) Performed: Procedure(s): VIDEO BRONCHOSCOPY WITH ENDOBRONCHIAL NAVIGATION (N/A) PLACEMENT OF FIDUCIAL MARKERS TIMES 3, RIGHT UPPER LOBE LUNG (Right)  Patient Location: PACU  Anesthesia Type:General  Level of Consciousness: awake, alert  and oriented  Airway & Oxygen Therapy: Patient Spontanous Breathing and Patient connected to nasal cannula oxygen  Post-op Assessment: Report given to RN, Post -op Vital signs reviewed and stable and Patient moving all extremities X 4  Post vital signs: Reviewed and stable  Last Vitals:  Vitals:   08/29/16 0805  BP: 125/87  Pulse: 66  Resp: 18  Temp: 36.6 C    Last Pain:  Vitals:   08/29/16 0805  TempSrc: Oral      Patients Stated Pain Goal: 2 (07/62/26 3335)  Complications: No apparent anesthesia complications

## 2016-08-29 NOTE — H&P (View-Only) (Signed)
Subjective:    Patient ID: [name], female    DOB: March 30, 1946, 70 y.o.   MRN: [id number]  HPI 70 year old woman with a history of tobacco use (45 pack years), hypertension, hyperlipidemia, allergic rhinitis. She carries a diagnosis of asthma made by about 6-8 yrs ago in setting URI. She has occasional wheeze, usually in setting of apparent AE's for which she has been rx with pred and abx. She is followed by Dr Reynaldo Minium, had slow progressive exertional dyspnea, underwent a CT scan of her chest 08/17/16 that shows a rounded RUL nodule with some spiculation. No cough, no sputum or hemoptysis. No CP or pleuritic sx.    Review of Systems  Constitutional: Negative for fever and unexpected weight change.  HENT: Positive for congestion. Negative for dental problem, ear pain, nosebleeds, postnasal drip, rhinorrhea, sinus pressure, sneezing, sore throat and trouble swallowing.   Eyes: Negative for redness and itching.  Respiratory: Positive for shortness of breath. Negative for cough, chest tightness and wheezing.   Cardiovascular: Negative for palpitations and leg swelling.  Gastrointestinal: Negative for nausea and vomiting.  Genitourinary: Negative for dysuria.  Musculoskeletal: Negative for joint swelling.  Skin: Negative for rash.  Neurological: Negative for headaches.  Hematological: Does not bruise/bleed easily.  Psychiatric/Behavioral: Negative for dysphoric mood. The patient is not nervous/anxious.    Past Medical History:  Diagnosis Date  . Arthritis   . Asthma    asthmatic bronchitis  . Hyperlipidemia   . Hypertension   . PONV (postoperative nausea and vomiting)      No family history on file.   Social History   Social History  . Marital status: Married    Spouse name: N/A  . Number of children: N/A  . Years of education: N/A   Occupational History  . Not on file.   Social History Main Topics  . Smoking status: Current Every Day Smoker    Packs/day: 0.50  .  Smokeless tobacco: Not on file  . Alcohol use No  . Drug use: No  . Sexual activity: Not on file   Other Topics Concern  . Not on file   Social History Narrative  . No narrative on file     No Known Allergies   Outpatient Medications Prior to Visit  Medication Sig Dispense Refill  . albuterol (PROVENTIL HFA;VENTOLIN HFA) 108 (90 BASE) MCG/ACT inhaler Inhale 2 puffs into the lungs every 6 (six) hours as needed. For shortness of breath    . aspirin EC 325 MG EC tablet Take 1 tablet (325 mg total) by mouth 2 (two) times daily. 60 tablet 0  . Calcium Carbonate-Vitamin D (CALCIUM 600 + D PO) Take 1 tablet by mouth 2 (two) times daily.    [name] diltiazem (TIAZAC) 300 MG 24 hr capsule Take 300 mg by mouth every morning.    . diphenhydrAMINE (BENADRYL) 25 mg capsule Take 50 mg by mouth every 6 (six) hours as needed. For allergies    . fluticasone (FLONASE) 50 MCG/ACT nasal spray Place 2 sprays into the nose daily as needed. For nasal decongestant    . simvastatin (ZOCOR) 20 MG tablet Take 20 mg by mouth every morning.    . sodium chloride (OCEAN) 0.65 % nasal spray Place 1 spray into the nose daily as needed. For nasal decongestant    . telmisartan (MICARDIS) 40 MG tablet Take 40 mg by mouth every morning.    . triamterene-hydrochlorothiazide (MAXZIDE-25) 37.5-25 MG per tablet Take 1 tablet by mouth  every morning.    . zolpidem (AMBIEN) 10 MG tablet Take 10 mg by mouth at bedtime as needed. For sleep    . docusate sodium 100 MG CAPS Take 100 mg by mouth 2 (two) times daily. (Patient not taking: Reported on 08/21/2016) 10 capsule 0  . guaiFENesin (MUCINEX) 600 MG 12 hr tablet Take 600 mg by mouth 2 (two) times daily.    . traMADol (ULTRAM) 50 MG tablet Take 50-100 mg by mouth every 8 (eight) hours as needed. For pain     No facility-administered medications prior to visit.         Objective:   Physical Exam Vitals:   08/21/16 1530  BP: 124/64  Pulse: 77  SpO2: 96%  Weight: 130 lb (59 kg)   Height: 5' 3.5" (1.613 m)   Gen: Pleasant, well-nourished, in no distress,  normal affect  ENT: No lesions,  mouth clear,  oropharynx clear, no postnasal drip  Neck: No JVD, no TMG, no carotid bruits  Lungs: No use of accessory muscles, R basilar insp crackles  Cardiovascular: RRR, heart sounds normal, no murmur or gallops, no peripheral edema  Musculoskeletal: No deformities, no cyanosis or clubbing  Neuro: alert, non focal  Skin: Warm, no lesions or rashes       Assessment & Plan:  Mass of upper lobe of right lung Right upper lobe mass discovered spuriously by CT scan of the chest and a longtime smoker. High likelihood for primary lung malignancy. I believe the best approach for tissue diagnosis is navigational bronchoscopy. I discussed the pros and cons of this modality with her. Discussed the risks and benefits. She agrees to proceed. We will work on obtaining CT chest Superd cuts, perform asap.   Tobacco use disorder Discussed cessation with her today.  Dyspnea on exertion BD with likely progressive symptoms. In the past she's only been symptomatic in the setting of exacerbations. She is now having progressive dyspnea. She needs full pulmonary function testing.  Baltazar Apo, MD, PhD 08/21/2016, 4:07 PM [facility] [phone] or if no answer [phone]

## 2016-08-29 NOTE — Op Note (Signed)
Video Bronchoscopy with Electromagnetic Navigation Procedure Note  Date of Operation: 08/29/2016  Pre-op Diagnosis: RUL mass  Post-op Diagnosis: same  Surgeon: Baltazar Apo  Assistants: none  Anesthesia: General endotracheal anesthesia  Operation: Flexible video fiberoptic bronchoscopy with electromagnetic navigation and biopsies.  Estimated Blood Loss: Minimal  Complications: none apparent  Indications and History: [name] is a 70 y.o. female with hx tobacco use, found to have a RUL spiculated mass on CT scan chest. Recommendation was made to pursue tissue diagnosis via navigational bronchoscopy.  The risks, benefits, complications, treatment options and expected outcomes were discussed with the patient.  The possibilities of pneumothorax, pneumonia, reaction to medication, pulmonary aspiration, perforation of a viscus, bleeding, failure to diagnose a condition and creating a complication requiring transfusion or operation were discussed with the patient who freely signed the consent.    Description of Procedure: The patient was seen in the Preoperative Area, was examined and was deemed appropriate to proceed.  The patient was taken to OR10, identified as [name] and the procedure verified as Flexible Video Fiberoptic Bronchoscopy.  A Time Out was held and the above information confirmed.   Prior to the date of the procedure a high-resolution CT scan of the chest was performed. Utilizing [place] a virtual tracheobronchial tree was generated to allow the creation of distinct navigation pathways to the patient's parenchymal abnormalities. After being taken to the operating room general anesthesia was initiated and the patient  was orally intubated. The video fiberoptic bronchoscope was introduced via the endotracheal tube and a general inspection was performed which showed normal airways throughout. There were no endobronchial lesions or abnormal secretions seen.  The extendable working channel and locator guide were introduced into the bronchoscope. The distinct navigation pathways prepared prior to this procedure were then utilized to navigate to within 0.5-1.0 cm of the center of the patient's lesion identified on CT scan. The extendable working channel was secured into place and the locator guide was withdrawn. Under fluoroscopic guidance transbronchial needle brushings, transbronchial Wang needle biopsies, and transbronchial forceps biopsies were performed to be sent for cytology and pathology. A bronchioalveolar lavage was performed in the RUL and sent for cytology and microbiology (bacterial, fungal, AFB smears and cultures). Following biopsies, 3 gold fiducial markers were placed triangulating the mass under fluoroscopic and navigational guidance. At the end of the procedure a general airway inspection was performed and there was no evidence of active bleeding. The bronchoscope was removed.  The patient tolerated the procedure well. There was no significant blood loss and there were no obvious complications. A post-procedural chest x-ray is pending.  Samples: 1. Transbronchial needle brushings from RUL mass 2. Transbronchial Wang needle biopsies from RUL mass 3. Transbronchial forceps biopsies from RUL mass 4. Bronchoalveolar lavage from RUL   Plans:  The patient will be discharged from the PACU to home when recovered from anesthesia and after chest x-ray is reviewed. We will review the cytology, pathology and microbiology results with the patient when they become available. Outpatient followup will be with Dr Lamonte Sakai.    Baltazar Apo, MD, PhD 08/29/2016, 11:32 AM [facility] [phone] or if no answer [phone]

## 2016-08-29 NOTE — Anesthesia Preprocedure Evaluation (Signed)
Anesthesia Evaluation  Patient identified by MRN, date of birth, ID band Patient awake    Reviewed: Allergy & Precautions, NPO status , Patient's Chart, lab work & pertinent test results  Airway Mallampati: II  TM Distance: >3 FB     Dental   Pulmonary shortness of breath, asthma , pneumonia, COPD, Current Smoker,    breath sounds clear to auscultation       Cardiovascular hypertension,  Rhythm:Regular Rate:Normal     Neuro/Psych    GI/Hepatic negative GI ROS, Neg liver ROS,   Endo/Other  negative endocrine ROS  Renal/GU negative Renal ROS     Musculoskeletal   Abdominal   Peds  Hematology   Anesthesia Other Findings   Reproductive/Obstetrics                             Anesthesia Physical Anesthesia Plan  ASA: III  Anesthesia Plan: General   Post-op Pain Management:    Induction: Intravenous  Airway Management Planned: Oral ETT  Additional Equipment:   Intra-op Plan:   Post-operative Plan: Possible Post-op intubation/ventilation and Extubation in OR  Informed Consent: I have reviewed the patients History and Physical, chart, labs and discussed the procedure including the risks, benefits and alternatives for the proposed anesthesia with the patient or authorized representative who has indicated his/her understanding and acceptance.   Dental advisory given  Plan Discussed with: CRNA and Anesthesiologist  Anesthesia Plan Comments:         Anesthesia Quick Evaluation

## 2016-08-29 NOTE — Anesthesia Procedure Notes (Signed)
Procedure Name: Intubation Date/Time: 08/29/2016 10:32 AM Performed by: Rejeana Brock L Pre-anesthesia Checklist: Patient identified, Emergency Drugs available, Suction available and Patient being monitored Patient Re-evaluated:Patient Re-evaluated prior to inductionOxygen Delivery Method: Circle System Utilized Preoxygenation: Pre-oxygenation with 100% oxygen Intubation Type: IV induction Ventilation: Mask ventilation without difficulty Laryngoscope Size: Mac and 3 Grade View: Grade I Tube type: Oral Number of attempts: 1 Airway Equipment and Method: Stylet and Oral airway Placement Confirmation: ETT inserted through vocal cords under direct vision,  positive ETCO2 and breath sounds checked- equal and bilateral Secured at: 21 cm Tube secured with: Tape Dental Injury: Teeth and Oropharynx as per pre-operative assessment

## 2016-08-31 ENCOUNTER — Encounter (HOSPITAL_COMMUNITY): Payer: Self-pay | Admitting: Emergency Medicine

## 2016-08-31 ENCOUNTER — Telehealth: Payer: Self-pay | Admitting: Emergency Medicine

## 2016-08-31 DIAGNOSIS — C3491 Malignant neoplasm of unspecified part of right bronchus or lung: Secondary | ICD-10-CM

## 2016-08-31 NOTE — Telephone Encounter (Signed)
MRI of the brain, does this need to be with or without contrast??

## 2016-08-31 NOTE — Telephone Encounter (Signed)
Discussed the results with the patient, shows squamous cell lung cancer. I have called pathology to request PD-1 / PDL-1 testing.  Please order an MRI Brain with and without contrast, order PET scan   I will order referral to [place], MD, PhD 08/31/2016, 3:00 PM [facility] [phone] or if no answer [phone]

## 2016-09-03 ENCOUNTER — Encounter: Payer: Self-pay | Admitting: Internal Medicine

## 2016-09-03 ENCOUNTER — Encounter: Payer: Self-pay | Admitting: Emergency Medicine

## 2016-09-03 ENCOUNTER — Telehealth: Payer: Self-pay | Admitting: Emergency Medicine

## 2016-09-03 ENCOUNTER — Other Ambulatory Visit: Payer: Self-pay | Admitting: Radiology

## 2016-09-03 DIAGNOSIS — R920 Mammographic microcalcification found on diagnostic imaging of breast: Secondary | ICD-10-CM | POA: Diagnosis not present

## 2016-09-03 DIAGNOSIS — N6012 Diffuse cystic mastopathy of left breast: Secondary | ICD-10-CM | POA: Diagnosis not present

## 2016-09-03 DIAGNOSIS — R938 Abnormal findings on diagnostic imaging of other specified body structures: Secondary | ICD-10-CM | POA: Diagnosis not present

## 2016-09-03 MED ORDER — ALPRAZOLAM 1 MG PO TABS: ORAL_TABLET | ORAL | 0 refills | Status: DC

## 2016-09-03 NOTE — Telephone Encounter (Signed)
Spoke with pt.  She is requesting something to help relax her due to possible anxiety during upcoming MRI.  PT states it doesn't need to be anything strong. Please advise.

## 2016-09-03 NOTE — Telephone Encounter (Signed)
Pt is aware that she can take Xanax 1 mg tablet 30 minutes prior to MRI procedure. I have called the 1 tablet to [place], [place]. Nothing more needed at this time.

## 2016-09-03 NOTE — Telephone Encounter (Signed)
With and without contrast.

## 2016-09-03 NOTE — Telephone Encounter (Signed)
She can have a Xanax '1mg'$  tablet, to take 30 minutes before the MRI

## 2016-09-11 ENCOUNTER — Other Ambulatory Visit: Payer: Self-pay | Admitting: Medical Oncology

## 2016-09-11 ENCOUNTER — Other Ambulatory Visit (HOSPITAL_BASED_OUTPATIENT_CLINIC_OR_DEPARTMENT_OTHER): Payer: Medicare Other

## 2016-09-11 ENCOUNTER — Encounter: Payer: Self-pay | Admitting: Internal Medicine

## 2016-09-11 ENCOUNTER — Telehealth: Payer: Self-pay | Admitting: *Deleted

## 2016-09-11 ENCOUNTER — Ambulatory Visit (HOSPITAL_BASED_OUTPATIENT_CLINIC_OR_DEPARTMENT_OTHER): Payer: Medicare Other | Admitting: Internal Medicine

## 2016-09-11 DIAGNOSIS — J449 Chronic obstructive pulmonary disease, unspecified: Secondary | ICD-10-CM | POA: Diagnosis not present

## 2016-09-11 DIAGNOSIS — R918 Other nonspecific abnormal finding of lung field: Secondary | ICD-10-CM

## 2016-09-11 DIAGNOSIS — C3411 Malignant neoplasm of upper lobe, right bronchus or lung: Secondary | ICD-10-CM | POA: Diagnosis not present

## 2016-09-11 DIAGNOSIS — I10 Essential (primary) hypertension: Secondary | ICD-10-CM

## 2016-09-11 DIAGNOSIS — C3491 Malignant neoplasm of unspecified part of right bronchus or lung: Secondary | ICD-10-CM

## 2016-09-11 HISTORY — DX: Malignant neoplasm of unspecified part of right bronchus or lung: C34.91

## 2016-09-11 LAB — CBC WITH DIFFERENTIAL/PLATELET
BASO%: 0.9 % (ref 0.0–2.0)
BASOS ABS: 0.1 10*3/uL (ref 0.0–0.1)
EOS ABS: 0.2 10*3/uL (ref 0.0–0.5)
EOS%: 1.5 % (ref 0.0–7.0)
HCT: 42.2 % (ref 34.8–46.6)
HEMOGLOBIN: 14 g/dL (ref 11.6–15.9)
LYMPH%: 21.2 % (ref 14.0–49.7)
MCH: 30.8 pg (ref 25.1–34.0)
MCHC: 33.2 g/dL (ref 31.5–36.0)
MCV: 92.8 fL (ref 79.5–101.0)
MONO#: 0.7 10*3/uL (ref 0.1–0.9)
MONO%: 7.1 % (ref 0.0–14.0)
NEUT#: 6.8 10*3/uL — ABNORMAL HIGH (ref 1.5–6.5)
NEUT%: 69.3 % (ref 38.4–76.8)
Platelets: 236 10*3/uL (ref 145–400)
RBC: 4.54 10*6/uL (ref 3.70–5.45)
RDW: 13.5 % (ref 11.2–14.5)
WBC: 9.8 10*3/uL (ref 3.9–10.3)
lymph#: 2.1 10*3/uL (ref 0.9–3.3)

## 2016-09-11 LAB — COMPREHENSIVE METABOLIC PANEL
ALBUMIN: 3.4 g/dL — AB (ref 3.5–5.0)
ALK PHOS: 70 U/L (ref 40–150)
ALT: 18 U/L (ref 0–55)
AST: 15 U/L (ref 5–34)
Anion Gap: 9 mEq/L (ref 3–11)
BUN: 19.7 mg/dL (ref 7.0–26.0)
CO2: 30 mEq/L — ABNORMAL HIGH (ref 22–29)
Calcium: 10.1 mg/dL (ref 8.4–10.4)
Chloride: 102 mEq/L (ref 98–109)
Creatinine: 0.9 mg/dL (ref 0.6–1.1)
EGFR: 67 mL/min/{1.73_m2} — AB (ref >90)
GLUCOSE: 106 mg/dL (ref 70–140)
POTASSIUM: 3.9 meq/L (ref 3.5–5.1)
SODIUM: 141 meq/L (ref 136–145)
Total Bilirubin: 0.22 mg/dL (ref 0.20–1.20)
Total Protein: 6.9 g/dL (ref 6.4–8.3)

## 2016-09-11 NOTE — Progress Notes (Signed)
[place] [phone]   [phone]  CONSULT NOTE  REFERRING PHYSICIAN: Dr. Baltazar Apo.  REASON FOR CONSULTATION:  70 years old white female recently diagnosed with lung cancer.  HPI [name] is a 70 y.o. female with past medical history significant for hypertension, dyslipidemia, COPD as well as long history of smoking. The patient was seen by her primary care physician in early November 2017 complaining of cough and shortness of breath for several months. She has been using her inhaler more frequently than before. Dr. Reynaldo Minium ordered CT scan of the chest without contrast which was performed on 08/17/2016 and it showed large area of irregular opacity in the anterior right upper lobe measuring approximately 4.2 x 4.0 x 4.2 cm in size with irregular somewhat satellite margins and several adjacent smaller nodular foci. The findings highly concerning for pulmonary neoplasm with satellite nodules in the anterior right upper lobe. There is a scattered normal-sized mediastinal nodes without definite thoracic adenopathy. The patient was referred to Dr. Lamonte Sakai and on 08/29/2016 she underwent flexible video fiberoptic bronchoscopy with electromagnetic navigation and biopsies under the care of Dr. Lamonte Sakai. 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 She had pulmonary function test performed on 08/24/2016 that is borderline and the patient may not be a great candidate for surgical resection. She is scheduled to have a PET scan as well as MRI of the brain on 09/14/2016. Dr. Lamonte Sakai kindly referred the patient to me today for evaluation and discussion of her treatment options. When seen today the patient is feeling fine with no specific complaints except for dry cough and shortness breath with exertion but no significant chest pain or hemoptysis. She denied having any significant weight loss or night sweats. She has  no nausea or vomiting, no fever or chills. She denied having any headache or visual changes. Family history significant for mother with COPD, father had heart disease and brother had prostate cancer. The patient is married and has one son. She was accompanied today by her husband [name]. She is to work at [employer]. She has a history of smoking 1 pack per day for around 45 years and quit 08/29/2016. She has no history of alcohol or drug abuse.  HPI  Past Medical History:  Diagnosis Date  . Arthritis   . Asthma    asthmatic bronchitis  . COPD (chronic obstructive pulmonary disease) ([place])   . Dyspnea   . History of kidney stones   . Hyperlipidemia   . Hypertension   . Pneumonia   . PONV (postoperative nausea and vomiting)    none with her hip surgery  . Restless leg syndrome     Past Surgical History:  Procedure Laterality Date  . ABDOMINAL HYSTERECTOMY    . CARDIAC CATHETERIZATION    . COLONOSCOPY    . CYST EXCISION     from back  . CYSTO    . DENTAL EXAMINATION UNDER ANESTHESIA    . EYE SURGERY Left    cataract surgery with lens implant  . TONSILLECTOMY    . TOTAL HIP ARTHROPLASTY  10/31/2012   Procedure: TOTAL HIP ARTHROPLASTY ANTERIOR APPROACH;  Surgeon: Mcarthur Rossetti, MD;  Location: [facility];  Service: Orthopedics;  Laterality: Right;  Right Total Hip Replacement  . VIDEO BRONCHOSCOPY WITH ENDOBRONCHIAL NAVIGATION N/A 08/29/2016   Procedure: VIDEO BRONCHOSCOPY WITH ENDOBRONCHIAL NAVIGATION;  Surgeon: Collene Gobble, MD;  Location: [facility];  Service:  Thoracic;  Laterality: N/A;    History reviewed. No pertinent family history.  Social History Social History  Substance Use Topics  . Smoking status: Current Every Day Smoker    Packs/day: 0.50  . Smokeless tobacco: Never Used     Comment: down to 1-2 a day  . Alcohol use No    Allergies  Allergen Reactions  . Nickel Rash    Current Outpatient Prescriptions  Medication Sig Dispense Refill  . albuterol  (PROVENTIL HFA;VENTOLIN HFA) 108 (90 BASE) MCG/ACT inhaler Inhale 2 puffs into the lungs every 6 (six) hours as needed. For shortness of breath    . ALPRAZolam (XANAX) 1 MG tablet Take tablet 30 minutes prior to MRI. 1 tablet 0  . aspirin EC 81 MG tablet Take 81 mg by mouth daily.    . Budesonide-Formoterol Fumarate (SYMBICORT IN) Inhale 2 puffs into the lungs daily.    . Calcium Carbonate-Vitamin D (CALCIUM 600 + D PO) Take 1 tablet by mouth 2 (two) times daily.    [name] diltiazem (TIAZAC) 300 MG 24 hr capsule Take 300 mg by mouth every morning.    . diphenhydrAMINE (BENADRYL) 25 mg capsule Take 50 mg by mouth every 6 (six) hours as needed. For allergies    . fluticasone (FLONASE) 50 MCG/ACT nasal spray Place 2 sprays into the nose daily as needed. For nasal decongestant    . loratadine (CLARITIN) 10 MG tablet Take 10 mg by mouth daily as needed for allergies.    . simvastatin (ZOCOR) 20 MG tablet Take 20 mg by mouth every morning.    . sodium chloride (OCEAN) 0.65 % nasal spray Place 1 spray into the nose daily as needed. For nasal decongestant    . telmisartan (MICARDIS) 40 MG tablet Take 40 mg by mouth every morning.    . triamterene-hydrochlorothiazide (MAXZIDE-25) 37.5-25 MG per tablet Take 1 tablet by mouth every morning.    . zolpidem (AMBIEN) 10 MG tablet Take 10 mg by mouth at bedtime as needed. For sleep     No current facility-administered medications for this visit.     Review of Systems  Constitutional: negative Eyes: negative Ears, nose, mouth, throat, and face: negative Respiratory: positive for cough, dyspnea on exertion and wheezing Cardiovascular: negative Gastrointestinal: negative Genitourinary:negative Integument/breast: negative Hematologic/lymphatic: negative Musculoskeletal:negative Neurological: negative Behavioral/Psych: negative Endocrine: negative Allergic/Immunologic: negative  Physical Exam  WFU:XNATF, healthy, no distress, well nourished, well  developed and anxious SKIN: skin color, texture, turgor are normal, no rashes or significant lesions HEAD: Normocephalic, No masses, lesions, tenderness or abnormalities EYES: normal, PERRLA, Conjunctiva are pink and non-injected EARS: External ears normal, Canals clear OROPHARYNX:no exudate, no erythema and lips, buccal mucosa, and tongue normal  NECK: supple, no adenopathy, no JVD LYMPH:  no palpable lymphadenopathy, no hepatosplenomegaly BREAST:not examined LUNGS: expiratory wheezes bilaterally HEART: regular rate & rhythm, no murmurs and no gallops ABDOMEN:abdomen soft, non-tender, normal bowel sounds and no masses or organomegaly BACK: Back symmetric, no curvature., No CVA tenderness EXTREMITIES:no joint deformities, effusion, or inflammation, no edema, no skin discoloration  NEURO: alert & oriented x 3 with fluent speech, no focal motor/sensory deficits  PERFORMANCE STATUS: ECOG 1  LABORATORY DATA: Lab Results  Component Value Date   WBC 9.8 09/11/2016   HGB 14.0 09/11/2016   HCT 42.2 09/11/2016   MCV 92.8 09/11/2016   PLT 236 09/11/2016      Chemistry      Component Value Date/Time   NA 141 09/11/2016 1324   K  3.9 09/11/2016 1324   CL 101 08/28/2016 1520   CO2 30 (H) 09/11/2016 1324   BUN 19.7 09/11/2016 1324   CREATININE 0.9 09/11/2016 1324      Component Value Date/Time   CALCIUM 10.1 09/11/2016 1324   ALKPHOS 70 09/11/2016 1324   AST 15 09/11/2016 1324   ALT 18 09/11/2016 1324   BILITOT 0.22 09/11/2016 1324       RADIOGRAPHIC STUDIES: Ct Chest Wo Contrast  Result Date: 08/17/2016 CLINICAL DATA:  Cough, increasing shortness of breath for several months, smoker, asthma, hypertension EXAM: CT CHEST WITHOUT CONTRAST TECHNIQUE: Multidetector CT imaging of the chest was performed following the standard protocol without IV contrast. Sagittal and coronal MPR images reconstructed from axial data set. COMPARISON:  Chest radiograph 01/08/2005 FINDINGS:  Cardiovascular: Atherosclerotic calcifications aorta and coronary arteries. Aorta normal caliber. No pericardial effusion. Mediastinum/Nodes: Esophagus unremarkable. Scattered normal sized mediastinal nodes without definite thoracic adenopathy. Lungs/Pleura: Severe emphysematous changes. Large area of irregular opacity in the anterior RIGHT upper lobe, overall approximately 4.2 x 4.0 x 4.2 cm in size with irregular somewhat stellate margins and several adjacent small nodular foci. No air bronchograms or definite surrounding infiltrate. Finding is highly concerning for a pulmonary neoplasm with satellite nodules in the anterior RIGHT upper lobe. Remaining lungs clear. No pleural effusion or pneumothorax. Upper Abdomen: Nonobstructing LEFT renal calculi. Swelling nodular/scarred appearance at upper pole LEFT kidney. Remaining visualized upper abdomen unremarkable. Musculoskeletal: Diffuse osseous demineralization. No focal bone lesions. IMPRESSION: Severe COPD changes with an irregular opacity in the anterior RIGHT upper lobe 4.2 x 4.0 x 4.2 cm in size demonstrating somewhat stellate irregular margins and several small adjacent nodular foci highly concerning for a primary pulmonary neoplasm with adjacent satellite nodules. Recommend tissue diagnosis and/or PET-CT imaging for further assessment. These results will be called to the ordering clinician or representative by the Radiologist Assistant, and communication documented in the PACS or zVision Dashboard. Electronically Signed   By: Lavonia Dana M.D.   On: 08/17/2016 13:31   Dg Chest Port 1 View  Result Date: 08/29/2016 CLINICAL DATA:  Status post bronchoscopy with right lung biopsy. EXAM: PORTABLE CHEST 1 VIEW COMPARISON:  CT scan of the chest of August 17, 2016 and PA and lateral chest x-ray of January 08, 2005. FINDINGS: The lungs are well-expanded. There is patchy alveolar opacity in the left upper lobe. There are surgical vascular clips visible here. There  is no pneumothorax. There is no pleural effusion. There is no mediastinal shift. The left lung is clear. The heart and pulmonary vascularity are normal. There is calcification in the wall of the aortic arch. IMPRESSION: There is no postprocedure pneumothorax or hemo thorax following right-sided bronchoscopy with lung biopsy. There is parenchymal density in the right upper lobe not greatly changed from August 17, 2016 CT scan. Electronically Signed   By: David  Martinique M.D.   On: 08/29/2016 11:51    ASSESSMENT: This is a very pleasant 70 years old white female with stage IB (T2a, N0, M0) non-small cell lung cancer, squamous cell carcinoma presented with right upper lobe lung mass diagnosed in November 2017 pending further staging workup.   PLAN: I had a lengthy discussion with the patient and her husband today about her current disease stage, prognosis and treatment options. I recommended for the patient to keep her appointment for the PET scan and MRI of the brain to rule out any metastatic disease. Her pulmonary function are borderline and if the patient has no evidence  for disease metastasis, I would consider referring the patient to cardiothoracic surgery to rule out the surgical option first. If she is not a good surgical candidate for resection, the patient may benefit from either curative radiotherapy as a single modality or concurrent with chemotherapy. We will arrange for the patient to come back for follow-up visit at the multidisciplinary thoracic oncology clinic on 09/20/2016 for more detailed discussion of her treatment options based on the final staging workup. The patient and her husband agreed to the current plan. She was advised to call immediately if she has any concerning symptoms in the interval. The patient voices understanding of current disease status and treatment options and is in agreement with the current care plan.  All questions were answered. The patient knows to call the  clinic with any problems, questions or concerns. We can certainly see the patient much sooner if necessary.  Thank you so much for allowing me to participate in the care of [name]. I will continue to follow up the patient with you and assist in her care.  I spent 40 minutes counseling the patient face to face. The total time spent in the appointment was 60 minutes.  Disclaimer: This note was dictated with voice recognition software. Similar sounding words can inadvertently be transcribed and may not be corrected upon review.   Corryn Madewell K. September 11, 2016, 5:37 PM

## 2016-09-11 NOTE — Patient Instructions (Signed)
Steps to Quit Smoking Smoking tobacco can be bad for your health. It can also affect almost every organ in your body. Smoking puts you and people around you at risk for many serious long-lasting (chronic) diseases. Quitting smoking is hard, but it is one of the best things that you can do for your health. It is never too late to quit. What are the benefits of quitting smoking? When you quit smoking, you lower your risk for getting serious diseases and conditions. They can include:  Lung cancer or lung disease.  Heart disease.  Stroke.  Heart attack.  Not being able to have children (infertility).  Weak bones (osteoporosis) and broken bones (fractures). If you have coughing, wheezing, and shortness of breath, those symptoms may get better when you quit. You may also get sick less often. If you are pregnant, quitting smoking can help to lower your chances of having a baby of low birth weight. What can I do to help me quit smoking? Talk with your doctor about what can help you quit smoking. Some things you can do (strategies) include:  Quitting smoking totally, instead of slowly cutting back how much you smoke over a period of time.  Going to in-person counseling. You are more likely to quit if you go to many counseling sessions.  Using resources and support systems, such as:  Online chats with a counselor.  Phone quitlines.  Printed self-help materials.  Support groups or group counseling.  Text messaging programs.  Mobile phone apps or applications.  Taking medicines. Some of these medicines may have nicotine in them. If you are pregnant or breastfeeding, do not take any medicines to quit smoking unless your doctor says it is okay. Talk with your doctor about counseling or other things that can help you. Talk with your doctor about using more than one strategy at the same time, such as taking medicines while you are also going to in-person counseling. This can help make quitting  easier. What things can I do to make it easier to quit? Quitting smoking might feel very hard at first, but there is a lot that you can do to make it easier. Take these steps:  Talk to your family and friends. Ask them to support and encourage you.  Call phone quitlines, reach out to support groups, or work with a counselor.  Ask people who smoke to not smoke around you.  Avoid places that make you want (trigger) to smoke, such as:  Bars.  Parties.  Smoke-break areas at work.  Spend time with people who do not smoke.  Lower the stress in your life. Stress can make you want to smoke. Try these things to help your stress:  Getting regular exercise.  Deep-breathing exercises.  Yoga.  Meditating.  Doing a body scan. To do this, close your eyes, focus on one area of your body at a time from head to toe, and notice which parts of your body are tense. Try to relax the muscles in those areas.  Download or buy apps on your mobile phone or tablet that can help you stick to your quit plan. There are many free apps, such as QuitGuide from the CDC (Centers for Disease Control and Prevention). You can find more support from smokefree.gov and other websites. This information is not intended to replace advice given to you by your health care provider. Make sure you discuss any questions you have with your health care provider. Document Released: 07/28/2009 Document Revised: 05/29/2016 Document   Reviewed: 02/15/2015 Elsevier Interactive Patient Education  2017 Elsevier Inc.  

## 2016-09-11 NOTE — Telephone Encounter (Signed)
"  I received a letter about today's appointment.  It reads to call to confirm appointment.  I will be there today." Reviewed valet parking, registration process and to bring insurance information.  No further needs or questions.

## 2016-09-14 ENCOUNTER — Encounter (HOSPITAL_COMMUNITY): Payer: Self-pay

## 2016-09-14 ENCOUNTER — Encounter (HOSPITAL_COMMUNITY)
Admission: RE | Admit: 2016-09-14 | Discharge: 2016-09-14 | Disposition: A | Discharge status: Home / Self Care | Payer: Medicare Other | Source: Ambulatory Visit | Attending: Emergency Medicine | Admitting: Emergency Medicine

## 2016-09-14 ENCOUNTER — Ambulatory Visit (HOSPITAL_COMMUNITY)
Admission: RE | Admit: 2016-09-14 | Discharge: 2016-09-14 | Disposition: A | Discharge status: Home / Self Care | Payer: Medicare Other | Source: Ambulatory Visit | Attending: Emergency Medicine | Admitting: Emergency Medicine

## 2016-09-14 DIAGNOSIS — C3491 Malignant neoplasm of unspecified part of right bronchus or lung: Secondary | ICD-10-CM | POA: Diagnosis not present

## 2016-09-14 DIAGNOSIS — C3490 Malignant neoplasm of unspecified part of unspecified bronchus or lung: Secondary | ICD-10-CM | POA: Diagnosis not present

## 2016-09-14 DIAGNOSIS — C3411 Malignant neoplasm of upper lobe, right bronchus or lung: Secondary | ICD-10-CM | POA: Diagnosis not present

## 2016-09-14 LAB — GLUCOSE, CAPILLARY: Glucose-Capillary: 94 mg/dL (ref 65–99)

## 2016-09-14 MED ORDER — FLUDEOXYGLUCOSE F - 18 (FDG) INJECTION: 6.5000 | Freq: Once | INTRAVENOUS | Status: DC | PRN

## 2016-09-14 MED ORDER — FLUDEOXYGLUCOSE F - 18 (FDG) INJECTION: 94.0000 | Freq: Once | INTRAVENOUS | Status: DC | PRN

## 2016-09-14 MED ORDER — GADOBENATE DIMEGLUMINE 529 MG/ML IV SOLN
15.0000 mL | Freq: Once | INTRAVENOUS | Status: AC | PRN
  Administered 2016-09-14: 12 mL via INTRAVENOUS

## 2016-09-17 ENCOUNTER — Telehealth: Payer: Self-pay | Admitting: Emergency Medicine

## 2016-09-17 NOTE — Telephone Encounter (Signed)
Pt is requesting PET scan & MRI results from 09/14/16.  RB please advise. Thanks.

## 2016-09-17 NOTE — Telephone Encounter (Signed)
Please let her know that the MRI and the PET scan show that there is no evidence of any cancer outside the chest. This is good news and they will incorporate this information into her treatment plan

## 2016-09-17 NOTE — Telephone Encounter (Signed)
Spoke with pt and advised of MRI and PET scan results per Dr Lamonte Sakai.  Pt verbalized understanding.  Nothing further needed.

## 2016-09-18 ENCOUNTER — Telehealth: Payer: Self-pay | Admitting: Emergency Medicine

## 2016-09-18 NOTE — Telephone Encounter (Signed)
Pt called and she stated that she did not know what to do about the follow up appt with Dr. Earlie Server.  She stated that they would schedule the appt for 12/7 but she has not heard back from them  I called and lmom for Dr. Lew Dawes nurse and asked that she call back and let us know what is going on with the appt.  Pt is aware that we are waiting to hear back from them and will contact her once this call has come through.

## 2016-09-20 ENCOUNTER — Telehealth: Payer: Self-pay | Admitting: *Deleted

## 2016-09-20 NOTE — Telephone Encounter (Signed)
Oncology Nurse Navigator Documentation  Received call from [name].  She explained she met with Dr. Earlie Server recently, has not received a call re follow-up appt.  She noted she has called [facility] general phone # but has not been able to reach anyone. I provided her phone number for Norton Blizzard, Thoracic Navigator.  Gayleen Orem, RN, BSN, [place] Oncology Nurse [facility] [phone]

## 2016-09-20 NOTE — Telephone Encounter (Signed)
I received a message from patient regarding follow up.  I looked up Dr. Worthy Flank last note and he would like her to be seen at thoracic clinic.  I gave her the appt to be seen on 09/27/16 arrive at 2:45.  She was thankful for the help.

## 2016-09-25 NOTE — Telephone Encounter (Signed)
Pt has the appt with the [facility] on 12/14.  Nothing further is needed.

## 2016-09-25 NOTE — Telephone Encounter (Signed)
Return call pt has appoint @ [facility] on 09/27/16@ 2:45.[name]

## 2016-09-25 NOTE — Telephone Encounter (Signed)
lmtcb for Dr. Worthy Flank office.

## 2016-09-26 ENCOUNTER — Telehealth: Payer: Self-pay | Admitting: *Deleted

## 2016-09-26 NOTE — Telephone Encounter (Signed)
Oncology Nurse Navigator Documentation  Oncology Nurse Navigator Flowsheets 09/26/2016  Navigator Location [facility]  Navigator Encounter Type Telephone/Ms. Krikorian called and left me a vm message regarding not understanding appt this Thursday.  She was to receive mailing about the clinic and has not as of today.  I called and left her a message that mailing was sent and was sorry for the delay.  I updated her on when she arrives at the cancer center and next steps.  I also asked her to call if needed more information.  Telephone Outgoing Call  Treatment Phase Pre-Tx/Tx Discussion  Barriers/Navigation Needs Education  Education Other  Interventions Education  Education Method Verbal  Acuity Level 1  Acuity Level 1 Minimal follow up required  Time Spent with Patient 15

## 2016-09-27 ENCOUNTER — Encounter: Payer: Self-pay | Admitting: *Deleted

## 2016-09-27 ENCOUNTER — Ambulatory Visit
Admission: RE | Admit: 2016-09-27 | Discharge: 2016-09-27 | Disposition: A | Discharge status: Home / Self Care | Payer: Medicare Other | Source: Ambulatory Visit | Attending: Radiation Oncology | Admitting: Radiation Oncology

## 2016-09-27 ENCOUNTER — Encounter: Payer: Self-pay | Admitting: Emergency Medicine

## 2016-09-27 ENCOUNTER — Ambulatory Visit (INDEPENDENT_AMBULATORY_CARE_PROVIDER_SITE_OTHER): Payer: Medicare Other | Admitting: Emergency Medicine

## 2016-09-27 ENCOUNTER — Institutional Professional Consult (permissible substitution) (INDEPENDENT_AMBULATORY_CARE_PROVIDER_SITE_OTHER): Payer: Medicare Other | Admitting: Cardiothoracic Surgery

## 2016-09-27 ENCOUNTER — Encounter: Payer: Self-pay | Admitting: Cardiothoracic Surgery

## 2016-09-27 ENCOUNTER — Ambulatory Visit: Payer: Medicare Other | Attending: Radiation Oncology | Admitting: Physical Therapy

## 2016-09-27 VITALS — BP 108/55 | HR 80 | Temp 98.0°F | Resp 18 | Ht 63.5 in | Wt 133.6 lb

## 2016-09-27 DIAGNOSIS — C3411 Malignant neoplasm of upper lobe, right bronchus or lung: Secondary | ICD-10-CM | POA: Insufficient documentation

## 2016-09-27 DIAGNOSIS — C3491 Malignant neoplasm of unspecified part of right bronchus or lung: Secondary | ICD-10-CM | POA: Diagnosis not present

## 2016-09-27 DIAGNOSIS — R293 Abnormal posture: Secondary | ICD-10-CM | POA: Diagnosis not present

## 2016-09-27 DIAGNOSIS — J449 Chronic obstructive pulmonary disease, unspecified: Secondary | ICD-10-CM | POA: Diagnosis not present

## 2016-09-27 MED ORDER — TIOTROPIUM BROMIDE-OLODATEROL 2.5-2.5 MCG/ACT IN AERS: 2.0000 | INHALATION_SPRAY | Freq: Every day | RESPIRATORY_TRACT | 0 refills | Status: DC

## 2016-09-27 NOTE — Patient Instructions (Signed)
Lung Cancer Lung cancer occurs when abnormal cells in the lung grow out of control and form a mass (tumor). There are several types of lung cancer. The two most common types are:  Non-small cell. In this type of lung cancer, abnormal cells are larger and grow more slowly than those of small cell lung cancer.  Small cell. In this type of lung cancer, abnormal cells are smaller than those of non-small cell lung cancer. Small cell lung cancer gets worse faster than non-small cell lung cancer.  What are the causes? The leading cause of lung cancer is smoking tobacco. The second leading cause is radon exposure. What increases the risk?  Smoking tobacco.  Exposure to secondhand tobacco smoke.  Exposure to radon gas.  Exposure to asbestos.  Exposure to arsenic in drinking water.  Air pollution.  Family or personal history of lung cancer.  Lung radiation therapy.  Being older than 65 years. What are the signs or symptoms? In the early stages, symptoms may not be present. As the cancer progresses, symptoms may include:  A lasting cough, possibly with blood.  Fatigue.  Unexplained weight loss.  Shortness of breath.  Wheezing.  Chest pain.  Loss of appetite.  Symptoms of advanced lung cancer include:  Hoarseness.  Bone or joint pain.  Weakness.  Nail problems.  Face or arm swelling.  Paralysis of the face.  Drooping eyelids.  How is this diagnosed? Lung cancer can be identified with a physical exam and with tests such as:  A chest X-ray.  A CT scan.  Blood tests.  A biopsy.  After a diagnosis is made, you will have more tests to determine the stage of the cancer. The stages of non-small cell lung cancer are:  Stage 0, also called carcinoma in situ. At this stage, abnormal cells are found in the inner lining of your lung or lungs.  Stage I. At this stage, abnormal cells have grown into a tumor that is no larger than 5 cm across. The cancer has entered  the deeper lung tissue but has not yet entered the lymph nodes or other parts of the body.  Stage II. At this stage, the tumor is 7 cm across or smaller and has entered nearby lymph nodes. Or, the tumor is 5 cm across or smaller and has invaded surrounding tissue but is not found in nearby lymph nodes. There may be more than one tumor present.  Stage III. At this stage, the tumor may be any size. There may be more than one tumor in the lungs. The cancer cells have spread to the lymph nodes and possibly to other organs.  Stage IV. At this stage, there are tumors in both lungs and the cancer has spread to other areas of the body.  The stages of small cell lung cancer are:  Limited. At this stage, the cancer is found only on one side of the chest.  Extensive. At this stage, the cancer is in the lungs and in tissues on the other side of the chest. The cancer has spread to other organs or is found in the fluid between the layers of your lungs.  How is this treated? Depending on the type and stage of your lung cancer, you may be treated with:  Surgery. This is done to remove a tumor.  Radiation therapy. This treatment destroys cancer cells using X-rays or other types of radiation.  Chemotherapy. This treatment uses medicines to destroy cancer cells.  Targeted therapy. This treatment   instead of all cells as other therapies do. You may also have a combination of treatments. Follow these instructions at home:  Do not use any tobacco products. This includes cigarettes, chewing tobacco, and electronic cigarettes. If you need help quitting, ask your health care provider.  Take medicines only as directed by your health care provider.  Eat a healthy diet. Work with a dietitian to make sure you are getting the nutrition you need.  Consider joining a support group or seeking counseling to help you cope with the stress of having lung cancer.  Let your cancer specialist  (oncologist) know if you are admitted to the hospital.  Keep all follow-up visits as directed by your health care provider. This is important. Contact a health care provider if:  You lose weight without trying.  You have a persistent cough and wheezing.  You feel short of breath.  You tire easily.  You experience bone or joint pain.  You have difficulty swallowing.  You feel hoarse or notice your voice changing.  Your pain medicine is not helping. Get help right away if:  You cough up blood.  You have new breathing problems.  You develop chest pain.  You develop swelling in:  One or both ankles or legs.  Your face, neck, or arms.  You are confused.  You experience paralysis in your face or a drooping eyelid. This information is not intended to replace advice given to you by your health care provider. Make sure you discuss any questions you have with your health care provider. Document Released: 01/07/2001 Document Revised: 03/08/2016 Document Reviewed: 02/04/2014 Elsevier Interactive Patient Education  2017 [place]. Lung Resection, Care After Introduction Refer to this sheet in the next few weeks. These instructions provide you with information on caring for yourself after your procedure. Your health care provider may also give you more specific instructions. Your treatment has been planned according to current medical practices, but problems sometimes occur. Call your health care provider if you have any problems or questions after your procedure. What can I expect after the procedure? After your procedure, it is typical to have the following: You may feel pain in your chest and throat. Patients may sometimes shiver or feel nauseous during recovery. Follow these instructions at home: You may resume a normal diet and activities as directed by your health care provider. Do not use any tobacco products, including cigarettes, chewing tobacco, or electronic  cigarettes. If you need help quitting, ask your health care provider. There are many different ways to close and cover an incision, including stitches, skin glue, and adhesive strips. Follow your health care provider's instructions on: Incision care. Bandage (dressing) changes and removal. Incision closure removal. Take medicines only as directed by your health care provider. Keep all follow-up visits as directed by your health care provider. This is important. Try to breathe deeply and cough as directed. Holding a pillow firmly over your ribs may help with discomfort. If you were given an incentive spirometer in the hospital, continue to use it as directed by your health care provider. Walk as directed by your health care provider. You may take a shower and gently wash the area of your incision with water and soap as directed by your health care provider. Do not use anything else to clean your incision except as directed by your health care provider. Do not take baths, swim, or use a hot tub until your health care provider approves. Contact a health care provider if:  You notice redness, swelling, or increasing pain at the incision site. You are bleeding at the incision site. You see pus coming from the incision site. You notice a bad smell coming from the incision site or bandage. Your incision breaks open. You cough up blood or pus, or you develop a cough that produces bad-smelling sputum. You have pain or swelling in your legs. You have increasing pain that is not controlled with medicine. You have trouble managing any of the tubes that have been left in place after surgery. You have fever or chills. Get help right away if: You have chest pain or an irregular or rapid heartbeat. You have dizzy episodes or faint. You have shortness of breath or difficulty breathing. You have persistent nausea or vomiting. You have a rash. This information is not intended to replace advice given to you  by your health care provider. Make sure you discuss any questions you have with your health care provider. Document Released: 04/20/2005 Document Revised: 03/08/2016 Document Reviewed: 11/20/2013  2017 Elsevier  Lung Resection Introduction A lung resection is a procedure to remove part or all of a lung. When an entire lung is removed, the procedure is called a pneumonectomy. When only part of a lung is removed, the procedure is called a lobectomy. A lung resection is typically done to get rid of a tumor or cancer, but it may be done to treat other conditions. This procedure can help relieve some or all of your symptoms and can also help keep the problem from getting worse. Lung resection may provide the best chance for curing your disease. However, the procedure may not necessarily cure lung cancer if that is the problem. Tell a health care provider about:  Any allergies you have.  All medicines you are taking, including vitamins, herbs, eye drops, creams, and over-the-counter medicines.  Any problems you or family members have had with anesthetic medicines.  Any blood disorders you have.  Any surgeries you have had.  Any medical conditions you have. What are the risks? Generally, lung resection is a safe procedure. However, problems can occur and include:  Excessive bleeding.  Infection.  Inability to breathe without a ventilator.  Persistent shortness of breath.  Heart problems, including abnormal rhythms and a risk of heart attack or heart failure.  Blood clots.  Injury to a blood vessel.  Injury to a nerve.  Failure to heal properly.  Stroke.  Bronchopleural fistula. This is a small hole between one of the main breathing tubes (bronchus) and the lining of the lungs. This is rare.  Reaction to anesthesia. What happens before the procedure? You may have tests done before the procedure, including:  Blood tests.  Urine tests.  X-rays.  Other imaging tests (such  as CT scans, MRI scans, and PET scans). These tests are done to find the exact size and location of the condition being treated with this surgery.  Pulmonary function tests. These are breathing tests to assess the function of your lungs before surgery and to decide how to best help your breathing after surgery.  Heart testing. This is done to make sure your heart is strong enough for the procedure.  Bronchoscopy. This is a technique that allows your health care provider to look at the inside of your airways. This is done using a soft, flexible tube (bronchoscope). Along with imaging tests, this can help your health care provider know the exact location and size of the area that will be removed during surgery.  Lymph node sampling. This may need to be done to see if the tumor has spread. It may be done as a separate surgery or right before your lung resection procedure. What happens during the procedure?  An IV tube will be placed in your arm. You will be given a medicine that makes you fall asleep (general anesthetic). You may also get pain medicine through a thin, flexible tube (catheter) in your back.  A breathing tube will be placed in your throat.  Once the surgical team has prepared you for surgery, your surgeon will make an incision on your side. Some resections are done through large incisions, while others can be done through small incisions using smaller instruments and assisted with small cameras (laparoscopic surgery).  Your surgeon will carefully cut the veins, arteries, and bronchus leading to your lung. After being cut, each of these pieces will be sewn or stapled closed. The lung or part of the lung will then be removed.  Your surgeon will check inside your chest to make sure there is no bleeding in or around the lungs. Lymph nodes near the lung may also be removed for later tests.  Your surgeon may put tubes into your chest to drain extra fluid and air after surgery.  Your  incision will be closed. This may be done using:  Stitches that absorb into your body and do not need to be removed.  Stitches that must be removed.  Staples that must be removed. What happens after the procedure?  You will be taken to the recovery area and your progress will be monitored. You may still have a breathing tube and other tubes or catheters in your body immediately after surgery. These will be removed during your recovery. You may be put on a respirator following surgery if some assistance is needed to help your breathing. When you are awake and not experiencing immediate problems from surgery, you will be moved to the intensive care unit (ICU) where you will continue your recovery.  You may feel pain in your chest and throat. Sometimes during recovery, patients may shiver or feel nauseous. You will be given medicine to help with pain and nausea.  The breathing tube will be taken out as soon as your health care providers feel you can breathe on your own. For most people, this happens on the same day as the surgery.  If your surgery and time in the ICU go well, most of the tubes and equipment will be taken out within 1-2 days after surgery. This is about how long most people stay in the ICU. You may need to stay longer, depending on how you are doing.  You should also start respiratory therapy in the ICU. This therapy uses breathing exercises to help your other lung stay healthy and get stronger.  As you improve, you will be moved to a regular hospital room for continued respiratory therapy, help with your bladder and bowels, and to continue medicines.  After your lung or part of your lung is taken out, there will be a space inside your chest. This space will often fill up with fluid over time. The amount of time this takes is different for each person.  You will receive care until you are doing well and your health care provider feels it is safe for you to go home or to transfer  to an extended care facility. This information is not intended to replace advice given to you by your health care provider. Make  sure you discuss any questions you have with your health care provider. Document Released: 12/22/2002 Document Revised: 03/08/2016 Document Reviewed: 11/20/2013  2017 Elsevier

## 2016-09-27 NOTE — Assessment & Plan Note (Signed)
He is planning to follow-up in thoracic oncology clinic today. They will need to make a decision regarding possible surgical resection versus medical therapy  / XRT. She is borderline for surgery based on her pulmonary function testing. She may need a cardiology pulmonary exercise test to determine whether she is truly a candidate. As mentioned her functional capacity is quite good

## 2016-09-27 NOTE — Progress Notes (Signed)
[place] Clinical Social Work  Clinical Social Work met with patient/family at [employer] appointment to offer support and assess for psychosocial needs.  Patient was accompanied by her spouse.  Patient and spouse shared frustration by not being able to speak with anybody at the cancer center after leaving multiple messages on the voicemail.  The patient shared her only other concern was waiting to determine a diagnosis and treatment plan.  CSW validated and normalized patient's feeling of frustration.  Clinical Social Work briefly discussed Clinical Social Work role and [employer] support programs/services.  Clinical Social Work encouraged patient to call with any additional questions or concerns.   Polo Riley, MSW, LCSW, OSW-C Clinical Social Worker [facility] [phone]

## 2016-09-27 NOTE — Addendum Note (Signed)
Addended by: Benson Setting L on: 09/27/2016 12:46 PM   Modules accepted: Orders

## 2016-09-27 NOTE — Assessment & Plan Note (Signed)
Severe obstruction based purely on spirometry although her clinical status and her functional capacity are quite good. Her FEV1 is 1.22 L or 57% predicted. I would like to change her Symbicort to Stiolto to see if she benefits more from this medication.

## 2016-09-27 NOTE — Patient Instructions (Signed)
Stop symbicort for now.  We will do a trial of Stiolto 2 puffs once a day.  Take albuterol 2 puffs up to every 4 hours if needed for shortness of breath.  Follow with Dr Julien Nordmann and Dr Servando Snare at [facility] this afternoon as planned.  Follow with Dr Lamonte Sakai in 2 months or sooner if you have any problems

## 2016-09-27 NOTE — Progress Notes (Signed)
Subjective:    Patient ID: [name], female    DOB: 01/26/1946, 70 y.o.   MRN: [id number]  HPI 70 year old woman with a history of tobacco use (45 pack years), hypertension, hyperlipidemia, allergic rhinitis. She carries a diagnosis of asthma made by about 6-8 yrs ago in setting URI. She has occasional wheeze, usually in setting of apparent AE's for which she has been rx with pred and abx. She is followed by Dr Reynaldo Minium, had slow progressive exertional dyspnea, underwent a CT scan of her chest 08/17/16 that shows a rounded RUL nodule with some spiculation. No cough, no sputum or hemoptysis. No CP or pleuritic sx.   ROV 12/14 17 -- Follow-up visit for history of tobacco use, presumed COPD, right upper lobe rounded spiculated nodule. She underwent bronchoscopy 08/29/16. 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 she has plans to be seen in the thoracic oncology clinic today. Pulmonary function testing was done on 08/24/16 that I personally reviewed. This shows severe obstruction without a bronchodilator response FEV1 1.22L (57% predicted), hyperinflation, decreased diffusion capacity that does not correct the normal range for alveolar volume. She has been on Symbicort for 2 years but not regularly. Has flonase and loratadine. Uses albuterol about once a week.    Review of Systems  Constitutional: Negative for fever and unexpected weight change.  HENT: Positive for congestion. Negative for dental problem, ear pain, nosebleeds, postnasal drip, rhinorrhea, sinus pressure, sneezing, sore throat and trouble swallowing.   Eyes: Negative for redness and itching.  Respiratory: Positive for shortness of breath. Negative for cough, chest tightness and wheezing.   Cardiovascular: Negative for palpitations and leg swelling.  Gastrointestinal: Negative for nausea and vomiting.  Genitourinary: Negative for dysuria.  Musculoskeletal: Negative for joint swelling.  Skin: Negative for  rash.  Neurological: Negative for headaches.  Hematological: Does not bruise/bleed easily.  Psychiatric/Behavioral: Negative for dysphoric mood. The patient is not nervous/anxious.    Past Medical History:  Diagnosis Date  . Arthritis   . Asthma    asthmatic bronchitis  . COPD (chronic obstructive pulmonary disease) ([place])   . Dyspnea   . History of kidney stones   . Hyperlipidemia   . Hypertension   . Pneumonia   . PONV (postoperative nausea and vomiting)    none with her hip surgery  . Restless leg syndrome   . 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   Social History   Social History  . Marital status: Married    Spouse name: N/A  . Number of children: N/A  . Years of education: N/A   Occupational History  . Not on file.   Social History Main Topics  . Smoking status: Former Smoker    Packs/day: 0.50    Quit date: 08/29/2016  . Smokeless tobacco: Never Used     Comment: down to 1-2 a day  . Alcohol use No  . Drug use: No  . Sexual activity: Not on file   Other Topics Concern  . Not on file   Social History Narrative  . No narrative on file     Allergies  Allergen Reactions  . Nickel Rash     Outpatient Medications Prior to Visit  Medication Sig Dispense Refill  . albuterol (PROVENTIL HFA;VENTOLIN HFA) 108 (90 BASE) MCG/ACT inhaler Inhale 2 puffs into the lungs every 6 (six) hours as needed. For shortness of breath    .  aspirin EC 81 MG tablet Take 81 mg by mouth daily.    . Budesonide-Formoterol Fumarate (SYMBICORT IN) Inhale 2 puffs into the lungs daily.    . Calcium Carbonate-Vitamin D (CALCIUM 600 + D PO) Take 1 tablet by mouth 2 (two) times daily.    [name] diltiazem (TIAZAC) 300 MG 24 hr capsule Take 300 mg by mouth every morning.    . diphenhydrAMINE (BENADRYL) 25 mg capsule Take 50 mg by mouth every 6 (six) hours as needed. For allergies    . fluticasone (FLONASE) 50 MCG/ACT nasal spray Place 2  sprays into the nose daily as needed. For nasal decongestant    . loratadine (CLARITIN) 10 MG tablet Take 10 mg by mouth daily as needed for allergies.    . simvastatin (ZOCOR) 20 MG tablet Take 20 mg by mouth every morning.    . sodium chloride (OCEAN) 0.65 % nasal spray Place 1 spray into the nose daily as needed. For nasal decongestant    . telmisartan (MICARDIS) 40 MG tablet Take 40 mg by mouth every morning.    . triamterene-hydrochlorothiazide (MAXZIDE-25) 37.5-25 MG per tablet Take 1 tablet by mouth every morning.    . zolpidem (AMBIEN) 10 MG tablet Take 10 mg by mouth at bedtime as needed. For sleep    . ALPRAZolam (XANAX) 1 MG tablet Take tablet 30 minutes prior to MRI. (Patient not taking: Reported on 09/27/2016) 1 tablet 0   No facility-administered medications prior to visit.         Objective:   Physical Exam Vitals:   09/27/16 0959  BP: 100/72  Pulse: 72  SpO2: 97%  Weight: 134 lb 9.6 oz (61.1 kg)  Height: 5' 3.5" (1.613 m)   Gen: Pleasant, well-nourished, in no distress,  normal affect  ENT: No lesions,  mouth clear,  oropharynx clear, no postnasal drip  Neck: No JVD, no TMG, no carotid bruits  Lungs: No use of accessory muscles, R basilar insp crackles  Cardiovascular: RRR, heart sounds normal, no murmur or gallops, no peripheral edema  Musculoskeletal: No deformities, no cyanosis or clubbing  Neuro: alert, non focal  Skin: Warm, no lesions or rashes       Assessment & Plan:  COPD (chronic obstructive pulmonary disease) (HCC) Severe obstruction based purely on spirometry although her clinical status and her functional capacity are quite good. Her FEV1 is 1.22 L or 57% predicted. I would like to change her Symbicort to Stiolto to see if she benefits more from this medication.  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 They will need to make a decision regarding possible surgical  resection versus medical therapy  / XRT. She is borderline for surgery based on her pulmonary function testing. She may need a cardiology pulmonary exercise test to determine whether she is truly a candidate. As mentioned her functional capacity is quite good  .Baltazar Apo, MD, PhD 09/27/2016, 10:28 AM [facility] [phone] or if no answer [phone]

## 2016-09-27 NOTE — Progress Notes (Signed)
[address]       [place] [address]             [id number]                    [facility] Medical Record #[id number] Date of Birth: [birth year]  Referring: Curt Bears, MD Primary Care: Geoffery Lyons, MD  Chief Complaint:   Lung Cancer  History of Present Illness:    [name] 70 y.o. female is seen in the office  today for new dx of Squamous cell lung cancer right lung. She has a historyof tobacco use (45 pack years), hypertension, hyperlipidemia, allergic rhinitis. She followed in the pulmonary clinic with  diagnosis of asthma made by about 6-8 yrs ago . She has noted  had slow progressive exertional dyspnea, underwent a CT scan of her chest 08/17/16 that shows a rounded RUL nodule with some spiculation. No cough, no sputum or hemoptysis. No CP or pleuritic sx.   She underwent bronchoscopy 08/29/16. 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  Pulmonary function testing was done on 08/24/16. This shows severe obstruction without a bronchodilator response FEV1 1.22L (57% predicted), hyperinflation, decreased diffusion capacity that does not correct the normal range for alveolar volume. She has been on Symbicort for 2 years but not regularly. Has flonase and loratadine. Uses albuterol about once a week.    She denies any current GI complains   Diagnosis Lung, biopsy, Right Upper Lobe - SQUAMOUS CELL CARCINOMA. Microscopic Comment Dr. Orene Desanctis agrees. (JDP:gt, 08/30/16) Claudette Laws MD Pathologist, Electronic Signature (Case signed 08/30/2016)  Current Activity/ Functional Status:  Patient is independent with mobility/ambulation, transfers, ADL's, IADL's.   Zubrod Score: At the time of surgery this patient's most appropriate activity status/level should be described as: '[]'$     0    Normal activity, no symptoms '[x]'$     1    Restricted in physical strenuous activity but ambulatory, able to do out light  work '[]'$     2    Ambulatory and capable of self care, unable to do work activities, up and about               >50 % of waking hours                              '[]'$     3    Only limited self care, in bed greater than 50% of waking hours '[]'$     4    Completely disabled, no self care, confined to bed or chair '[]'$     5    Moribund   Past Medical History:  Diagnosis Date  . Arthritis   . Asthma    asthmatic bronchitis  . COPD (chronic obstructive pulmonary disease) ([place])   . Dyspnea   . History of kidney stones   . Hyperlipidemia   . Hypertension   . Pneumonia   . PONV (postoperative nausea and vomiting)    none with her hip surgery  . Restless leg syndrome   . Stage I squamous cell carcinoma of right lung ([place]) 09/11/2016    Past Surgical History:  Procedure Laterality Date  . ABDOMINAL HYSTERECTOMY    . CARDIAC CATHETERIZATION    . COLONOSCOPY    . CYST EXCISION     from back  . CYSTO    .  DENTAL EXAMINATION UNDER ANESTHESIA    . EYE SURGERY Left    cataract surgery with lens implant  . TONSILLECTOMY    . TOTAL HIP ARTHROPLASTY  10/31/2012   Procedure: TOTAL HIP ARTHROPLASTY ANTERIOR APPROACH;  Surgeon: Mcarthur Rossetti, MD;  Location: [facility];  Service: Orthopedics;  Laterality: Right;  Right Total Hip Replacement  . VIDEO BRONCHOSCOPY WITH ENDOBRONCHIAL NAVIGATION N/A 08/29/2016   Procedure: VIDEO BRONCHOSCOPY WITH ENDOBRONCHIAL NAVIGATION;  Surgeon: Collene Gobble, MD;  Location: [facility];  Service: Thoracic;  Laterality: N/A;    No family history on file.  Social History   Social History  . Marital status: Married    Spouse name: N/A  . Number of children: N/A  . Years of education: N/A   Occupational History  . Not on file.   Social History Main Topics  . Smoking status: Former Smoker    Packs/day: 0.50    Quit date: 08/29/2016  . Smokeless tobacco: Never Used     Comment: down to 1-2 a day  . Alcohol use No  . Drug use: No  . Sexual activity: Not on  file   Other Topics Concern  . Not on file   Social History Narrative  . No narrative on file    History  Smoking Status  . Former Smoker  . Packs/day: 0.50  . Quit date: 08/29/2016  Smokeless Tobacco  . Never Used    Comment: down to 1-2 a day    History  Alcohol Use No     Allergies  Allergen Reactions  . Nickel Rash    Current Outpatient Prescriptions  Medication Sig Dispense Refill  . albuterol (PROVENTIL HFA;VENTOLIN HFA) 108 (90 BASE) MCG/ACT inhaler Inhale 2 puffs into the lungs every 6 (six) hours as needed. For shortness of breath    . ALPRAZolam (XANAX) 1 MG tablet Take tablet 30 minutes prior to MRI. (Patient not taking: Reported on 09/27/2016) 1 tablet 0  . aspirin EC 81 MG tablet Take 81 mg by mouth daily.    . Budesonide-Formoterol Fumarate (SYMBICORT IN) Inhale 2 puffs into the lungs daily.    . Calcium Carbonate-Vitamin D (CALCIUM 600 + D PO) Take 1 tablet by mouth 2 (two) times daily.    [name] diltiazem (TIAZAC) 300 MG 24 hr capsule Take 300 mg by mouth every morning.    . diphenhydrAMINE (BENADRYL) 25 mg capsule Take 50 mg by mouth every 6 (six) hours as needed. For allergies    . fluticasone (FLONASE) 50 MCG/ACT nasal spray Place 2 sprays into the nose daily as needed. For nasal decongestant    . loratadine (CLARITIN) 10 MG tablet Take 10 mg by mouth daily as needed for allergies.    . simvastatin (ZOCOR) 20 MG tablet Take 20 mg by mouth every morning.    . sodium chloride (OCEAN) 0.65 % nasal spray Place 1 spray into the nose daily as needed. For nasal decongestant    . telmisartan (MICARDIS) 40 MG tablet Take 40 mg by mouth every morning.    . Tiotropium Bromide-Olodaterol (STIOLTO RESPIMAT) 2.5-2.5 MCG/ACT AERS Inhale 2 puffs into the lungs daily. 1 Inhaler 0  . triamterene-hydrochlorothiazide (MAXZIDE-25) 37.5-25 MG per tablet Take 1 tablet by mouth every morning.    . zolpidem (AMBIEN) 10 MG tablet Take 10 mg by mouth at bedtime as needed. For sleep       No current facility-administered medications for this visit.       Review of Systems:  Cardiac Review of Systems: Y or N  Chest Pain [  n  ]  Resting SOB [n ] Exertional SOB  [ y ]  Vertell Limber Florencio.Farrier  ]   Pedal Edema [ n  ]    Palpitations [n  ] Syncope  Florencio.Farrier ]   Presyncope [  n ]  General Review of Systems: [Y] = yes [  ]=no Constitional: recent weight change [ n ];  Wt loss over the last 3 months [   ] anorexia [  ]; fatigue [ y ]; nausea [  ]; night sweats [  ]; fever [  ]; or chills [  ];          Dental: poor dentition[  ]; Last Dentist visit:   Eye : blurred vision [  ]; diplopia [   ]; vision changes [  ];  Amaurosis fugax[  ]; Resp: cough [ y ];  wheezing[ y ];  hemoptysis[ n]; shortness of breath[ y ]; paroxysmal nocturnal dyspnea[  ]; dyspnea on exertion[ y ]; or orthopnea[  ];  GI:  gallstones[  ], vomiting[  ];  dysphagia[  ]; melena[  ];  hematochezia [  ]; heartburn[  ];   Hx of  Colonoscopy[  ]; GU: kidney stones [  ]; hematuria[  ];   dysuria [  ];  nocturia[  ];  history of     obstruction [  ]; urinary frequency [  ]             Skin: rash, swelling[  ];, hair loss[  ];  peripheral edema[  ];  or itching[  ]; Musculosketetal: myalgias[  ];  joint swelling[  ];  joint erythema[  ];  joint pain[  ];  back pain[  ];  Heme/Lymph: bruising[  ];  bleeding[  ];  anemia[  ];  Neuro: TIA[n  ];  headaches[  ];  stroke[  ];  vertigo[  ];  seizures[ n ];   paresthesias[n  ];  difficulty walking[ n ];  Psych:depression[  ]; anxiety[  ];  Endocrine: diabetes[ n ];  thyroid dysfunction[ n ];  Immunizations: Flu up to date Blue.Reese  ]; Pneumococcal up to date [ y ];  Other:  Physical Exam: BP (!) 108/55 (BP Location: Left Arm, Patient Position: Sitting)   Pulse 80   Temp 98 F (36.7 C) (Oral)   Resp 18   Ht 5' 3.5" (1.613 m)   Wt 133 lb 9.6 oz (60.6 kg)   SpO2 100%   BMI 23.29 kg/m   PHYSICAL EXAMINATION: General appearance: alert, cooperative, appears older than stated age and no  distress Head: Normocephalic, without obvious abnormality, atraumatic Neck: no adenopathy, no carotid bruit, no JVD, supple, symmetrical, trachea midline and thyroid not enlarged, symmetric, no tenderness/mass/nodules Lymph nodes: Cervical, supraclavicular, and axillary nodes normal. Resp: clear to auscultation bilaterally Back: symmetric, no curvature. ROM normal. No CVA tenderness. Cardio: regular rate and rhythm, S1, S2 normal, no murmur, click, rub or gallop GI: soft, non-tender; bowel sounds normal; no masses,  no organomegaly Extremities: extremities normal, atraumatic, no cyanosis or edema Neurologic: Grossly normal   Diagnostic Studies & Laboratory data:     Recent Radiology Findings:   Mr Jeri Cos Wo Contrast  Result Date: 09/14/2016 CLINICAL DATA:  70 y/o  F; lung cancer. EXAM: MRI HEAD WITHOUT AND WITH CONTRAST TECHNIQUE: Multiplanar, multiecho pulse sequences of the brain and surrounding structures were obtained without and with intravenous contrast. CONTRAST:  27m MULTIHANCE GADOBENATE DIMEGLUMINE 529 MG/ML IV SOLN COMPARISON:  None. FINDINGS: Brain: No acute infarction, hemorrhage, hydrocephalus, extra-axial collection or mass lesion. 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 Scattered nonspecific foci of T2 FLAIR hyperintense signal abnormality in subcortical and periventricular white matter compatible with mild chronic microvascular ischemic changes for age. Mild brain parenchymal volume loss. No abnormal enhancement. Vascular: Normal flow voids. Skull and upper cervical spine: Normal marrow signal. Sinuses/Orbits: Negative. Other: None. IMPRESSION: 1. No evidence for intracranial metastasis. 2. Mild chronic microvascular ischemic changes and parenchymal volume loss of the brain. Electronically Signed   By: LKristine GarbeM.D.   On: 09/14/2016 20:29   Nm Pet Image Initial (pi) Skull Base To Thigh  Result Date: 09/14/2016 CLINICAL DATA:  Initial treatment strategy for right  upper lobe squamous cell carcinoma. EXAM: NUCLEAR MEDICINE PET SKULL BASE TO THIGH TECHNIQUE: 6.5 mCi F-18 FDG was injected intravenously. Full-ring PET imaging was performed from the skull base to thigh after the radiotracer. CT data was obtained and used for attenuation correction and anatomic localization. FASTING BLOOD GLUCOSE:  Value: 94 mg/dl COMPARISON:  Chest CT 08/17/2016 FINDINGS: NECK No hypermetabolic cervical lymph nodes are identified.There are no lesions of the pharyngeal mucosal space. CHEST There are no hypermetabolic mediastinal, hilar or axillary lymph nodes. The dominant irregular, right upper lobe mass is hypermetabolic within SUV max of 13.1. It measures approximately 4.8 x 4.3 cm on image 25. Adjacent satellite nodularity is also hypermetabolic. There is no abnormal metabolic activity or suspicious nodularity elsewhere in the right lung or in the left lung. There is no significant pleural or pericardial effusion. Atherosclerosis of the aorta, great vessels and coronary arteries noted. ABDOMEN/PELVIS There is no hypermetabolic activity within the liver, adrenal glands, spleen or pancreas. There is no hypermetabolic nodal activity. There is prominent metabolic activity within the gastric cardia (SUV max 9.2). This corresponds with soft tissue thickening on the CT images. This could reflect a proximal gastric mass or inflammation. Cortical scarring and calcifications are present within the left kidney. There is atherosclerosis of the aorta and its branch vessels. There is diverticulosis of the distal colon. SKELETON There is no hypermetabolic activity to suggest osseous metastatic disease. Previous right total hip arthroplasty noted. IMPRESSION: 1. The biopsy-proven right upper lobe squamous cell carcinoma is hypermetabolic. There are adjacent satellite nodules. Some of this opacity may be secondary to postobstructive pneumonitis. 2. No nodal or distant metastases demonstrated. By imaging, this  is most consistent with stage IB disease (T2a N0 M0). 3. Hypermetabolic activity within the gastric cardia suspicious for possible gastric neoplasm versus inflammation. Further evaluation with upper endoscopy recommended. Electronically Signed   By: WRichardean SaleM.D.   On: 09/14/2016 12:50     I have independently reviewed the above radiologic studies.  Recent Lab Findings: Lab Results  Component Value Date   WBC 9.8 09/11/2016   HGB 14.0 09/11/2016   HCT 42.2 09/11/2016   PLT 236 09/11/2016   GLUCOSE 106 09/11/2016   ALT 18 09/11/2016   AST 15 09/11/2016   NA 141 09/11/2016   K 3.9 09/11/2016   CL 101 08/28/2016   CREATININE 0.9 09/11/2016   BUN 19.7 09/11/2016   CO2 30 (H) 09/11/2016   INR 0.95 10/27/2012    PFT's Dated 08/24/2016 FEV1: 1.22 57% DLCO 12.02  52% The FEV1, FEV1/FVC ratio and FEF25-75% are reduced indicating airway obstruction. The TLC, FRC and RV are increased indicating overinflation. Following administration of bronchodilators, there is no significant  response. The reduced diffusing capacity indicates a moderately severe loss of functional alveolar capillary surface. 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 - Patient  has seen Dr Watt Climes previously Will proceed with CPX-cardiopulmonary testing to further evaluate tolerance of lobectomy   I  spent 40 minutes counseling the patient face to face and 50% or more the  time was spent in counseling and coordination of care. The total time spent in the appointment was 60 minutes.  [name] MD      [address] [place] [address] Office [phone]   Beeper [phone]  09/28/2016 8:28 AM

## 2016-09-27 NOTE — Therapy (Signed)
[place], [place], [address] Phone: [phone]   Fax:  [phone]  Physical Therapy Evaluation  Patient Details  Name: [name] MRN: [id number] Date of Birth: 1946-10-15 Referring Provider: Dr. Lanelle Bal  Encounter Date: 09/27/2016      PT End of Session - 09/27/16 1757    Visit Number 1   Number of Visits 1   PT Start Time 8341   PT Stop Time 1712   PT Time Calculation (min) 20 min   Activity Tolerance Patient tolerated treatment well   Behavior During Therapy [facility] for tasks assessed/performed      Past Medical History:  Diagnosis Date  . Arthritis   . Asthma    asthmatic bronchitis  . COPD (chronic obstructive pulmonary disease) ([place])   . Dyspnea   . History of kidney stones   . Hyperlipidemia   . Hypertension   . Pneumonia   . PONV (postoperative nausea and vomiting)    none with her hip surgery  . Restless leg syndrome   . Stage I squamous cell carcinoma of right lung ([place]) 09/11/2016    Past Surgical History:  Procedure Laterality Date  . ABDOMINAL HYSTERECTOMY    . CARDIAC CATHETERIZATION    . COLONOSCOPY    . CYST EXCISION     from back  . CYSTO    . DENTAL EXAMINATION UNDER ANESTHESIA    . EYE SURGERY Left    cataract surgery with lens implant  . TONSILLECTOMY    . TOTAL HIP ARTHROPLASTY  10/31/2012   Procedure: TOTAL HIP ARTHROPLASTY ANTERIOR APPROACH;  Surgeon: Mcarthur Rossetti, MD;  [facility];  Service: Orthopedics;  Laterality: Right;  Right Total Hip Replacement  . VIDEO BRONCHOSCOPY WITH ENDOBRONCHIAL NAVIGATION N/A 08/29/2016   Procedure: VIDEO BRONCHOSCOPY WITH ENDOBRONCHIAL NAVIGATION;  Surgeon: Collene Gobble, MD;  Location[facility];  Service: Thoracic;  Laterality: N/A;    There were no vitals filed for this visit.       Subjective Assessment - 09/27/16 1745    Subjective The only exercise I get is going up and down the steps of  our split level 50 times a day and housecleaning.   Patient is accompained by: Family member  husband   Pertinent History Presented with cough and SOB 08/2016.  Work up showed right lung mass; diagnosis is right upper lobe 4.8 cm. squamous cell carcinoma, but pt. also had a finding on PET of a positive nodule at gastric cardia, so pt. needs a GI workup in addition.  Brain MR was negative.  Current smoker; arthritis, asthma, COPD, dyspnea; HTN, Right THA via anterior approach 10/31/2012.   Patient Stated Goals get info from all lung clinic providers   Currently in Pain? No/denies            [facility] PT Assessment - 09/27/16 0001      Assessment   Medical Diagnosis right upper lobe squamous cell carcinoma; also +PET nodule at gastric cardia, as yet undiagnosed   Referring Provider Dr. Lanelle Bal   Onset Date/Surgical Date --  08/2016   Prior Therapy none     Precautions   Precautions Other (comment)   Precaution Comments cancer precautions     Restrictions   Weight Bearing Restrictions No     Balance Screen   Has the patient fallen in the past 6 months No   Has the patient had a decrease in activity level because of a  fear of falling?  No   Is the patient reluctant to leave their home because of a fear of falling?  No     Home Ecologist residence   Living Arrangements Spouse/significant other   Type of [facility] Layout Multi-level  split level     Prior Function   Level of [place] no regular exercise     Cognition   Overall Cognitive Status Within Functional Limits for tasks assessed     Functional Tests   Functional tests Sit to Stand     Sit to Stand   Comments 12 times in 30 seconds, okay for age  mild dyspnea following this     Posture/Postural Control   Posture/Postural Control Postural limitations   Postural Limitations Forward head  mild     ROM / Strength   AROM / PROM / Strength AROM      AROM   Overall AROM  Within functional limits for tasks performed   Overall AROM Comments standing trunk AROM     Ambulation/Gait   Ambulation/Gait Yes   Ambulation/Gait Assistance 7: Independent     Balance   Balance Assessed Yes     Dynamic Standing Balance   Dynamic Standing - Comments reaches forward 11 inches in standing, okay for age                           PT Education - 09/27/16 1756    Education provided Yes   Education Details cough splinting, energy conservation, walking, Cure article on staying active, posture, breathing, PT info   Person(s) Educated Patient;Spouse   Methods Explanation;Handout   Comprehension Verbalized understanding               Lung Clinic Goals - 09/27/16 1805      Patient will be able to verbalize understanding of the benefit of exercise to decrease fatigue.   Status Achieved     Patient will be able to verbalize the importance of posture.   Status Achieved     Patient will be able to demonstrate diaphragmatic breathing for improved lung function.   Status Achieved     Patient will be able to verbalize understanding of the role of physical therapy to prevent functional decline and who to contact if physical therapy is needed.   Status Achieved             Plan - 09/27/16 1757    Clinical Impression Statement Case was discussed at multidisciplinary conference prior to assessment.  Pt. with diagnosis of right upper lobe squamous cell carcinoma and a positive PET finding at the gastric cardia, with it still to be determined just what that is.  Brain MR was negative.  She needs GI workup and may have surgery for lung tumor; other treatment to be determined depending on GI finding.  She is doing fairly well presently, with limited endurance the main issue identified on assessment.   Rehab Potential Good   PT Frequency One time visit   PT Treatment/Interventions Patient/family education   PT Next Visit  Plan None at this time.   PT Home Exercise Plan walking or other cardiovascular exercise; breathing exercise   Consulted and Agree with Plan of Care Patient      Patient will benefit from skilled therapeutic intervention in order to improve the following deficits and impairments:  Cardiopulmonary status limiting activity, Decreased endurance  Visit Diagnosis: Malignant neoplasm of upper lobe of right lung ([place]) - Plan: PT plan of care cert/re-cert  Abnormal posture - Plan: PT plan of care cert/re-cert      G-Codes - 03/40/35 1805    Functional Assessment Tool Used clinical judgement   Functional Limitation Mobility: Walking and moving around   Mobility: Walking and Moving Around Current Status [phone]) At least 1 percent but less than 20 percent impaired, limited or restricted   Mobility: Walking and Moving Around Goal Status [phone]) At least 1 percent but less than 20 percent impaired, limited or restricted   Mobility: Walking and Moving Around Discharge Status [phone]) At least 1 percent but less than 20 percent impaired, limited or restricted       Problem List Patient Active Problem List   Diagnosis Date Noted  . COPD (chronic obstructive pulmonary disease) ([place]) 09/27/2016  . Stage I squamous cell carcinoma of right lung ([place]) 09/11/2016  . Lung mass 08/21/2016  . Tobacco use disorder 08/21/2016  . Dyspnea on exertion 08/21/2016  . Degenerative arthritis of hip 10/31/2012    Quill Grinder 09/27/2016, 6:08 PM  [place], [place], [address] Phone: [phone]   Fax:  [phone]  Name: [name] MRN: [id number] Date of Birth: [birth year]  Serafina Royals, PT 09/27/16 6:08 PM

## 2016-09-28 ENCOUNTER — Encounter: Payer: Self-pay | Admitting: *Deleted

## 2016-09-28 NOTE — Progress Notes (Signed)
Oncology Nurse Navigator Documentation  Oncology Nurse Navigator Flowsheets 09/28/2016  Navigator Location [facility]  Navigator Encounter Type Clinic/MDC;Telephone/I spoke with patient and husband yesterday at [facility].  I gave and explained information on lung cancer DX, resources and support groups at [facility], and next steps.  Per [place] and cancer conference discussion patient needs more work up before treatment can begin.  Patient verbalized understanding.   Today, I follow up on appt with GI doctor, Dr. Watt Climes.  Patient is scheduled to see him on 10/03/16.  She will have CPX on 10/19/16.    Telephone Outgoing Call  Abnormal Finding Date 08/17/2016  Confirmed Diagnosis Date 08/29/2016  Multidisiplinary Clinic Date 09/27/2016  Patient Visit Type MedOnc  Treatment Phase Pre-Tx/Tx Discussion  Barriers/Navigation Needs Education;Coordination of Care  Education Newly Diagnosed Cancer Education;Other  Interventions Coordination of Care;Education  Coordination of Care Appts  Education Method Verbal;Written  Acuity Level 2  Acuity Level 2 Assistance expediting appointments;Educational needs  Time Spent with Patient 72

## 2016-10-03 DIAGNOSIS — R933 Abnormal findings on diagnostic imaging of other parts of digestive tract: Secondary | ICD-10-CM | POA: Diagnosis not present

## 2016-10-04 ENCOUNTER — Telehealth: Payer: Self-pay | Admitting: Emergency Medicine

## 2016-10-04 MED ORDER — TIOTROPIUM BROMIDE-OLODATEROL 2.5-2.5 MCG/ACT IN AERS: 2.0000 | INHALATION_SPRAY | Freq: Every day | RESPIRATORY_TRACT | 0 refills | Status: DC

## 2016-10-04 MED ORDER — TIOTROPIUM BROMIDE-OLODATEROL 2.5-2.5 MCG/ACT IN AERS: 2.0000 | INHALATION_SPRAY | Freq: Every day | RESPIRATORY_TRACT | 3 refills | Status: DC

## 2016-10-04 NOTE — Telephone Encounter (Signed)
Spoke with pt. She is needing a prescription sent to [facility] for [facility] This has been taken care of. She also requests to have a 30 day supply sent to her local pharmacy. This has also been done. Nothing further was needed.

## 2016-10-05 DIAGNOSIS — R933 Abnormal findings on diagnostic imaging of other parts of digestive tract: Secondary | ICD-10-CM | POA: Diagnosis not present

## 2016-10-05 DIAGNOSIS — K449 Diaphragmatic hernia without obstruction or gangrene: Secondary | ICD-10-CM | POA: Diagnosis not present

## 2016-10-05 DIAGNOSIS — K2950 Unspecified chronic gastritis without bleeding: Secondary | ICD-10-CM | POA: Diagnosis not present

## 2016-10-10 ENCOUNTER — Other Ambulatory Visit (HOSPITAL_COMMUNITY): Payer: Self-pay | Admitting: *Deleted

## 2016-10-10 ENCOUNTER — Telehealth: Payer: Self-pay | Admitting: *Deleted

## 2016-10-10 ENCOUNTER — Ambulatory Visit (HOSPITAL_COMMUNITY): Payer: Medicare Other | Attending: Cardiothoracic Surgery

## 2016-10-10 ENCOUNTER — Other Ambulatory Visit: Payer: Self-pay | Admitting: *Deleted

## 2016-10-10 ENCOUNTER — Encounter (HOSPITAL_COMMUNITY): Payer: Medicare Other

## 2016-10-10 DIAGNOSIS — G2581 Restless legs syndrome: Secondary | ICD-10-CM | POA: Diagnosis not present

## 2016-10-10 DIAGNOSIS — Z79899 Other long term (current) drug therapy: Secondary | ICD-10-CM | POA: Diagnosis not present

## 2016-10-10 DIAGNOSIS — C3491 Malignant neoplasm of unspecified part of right bronchus or lung: Secondary | ICD-10-CM

## 2016-10-10 DIAGNOSIS — I10 Essential (primary) hypertension: Secondary | ICD-10-CM | POA: Diagnosis not present

## 2016-10-10 DIAGNOSIS — Z87891 Personal history of nicotine dependence: Secondary | ICD-10-CM | POA: Diagnosis not present

## 2016-10-10 DIAGNOSIS — R0600 Dyspnea, unspecified: Secondary | ICD-10-CM | POA: Insufficient documentation

## 2016-10-10 DIAGNOSIS — E785 Hyperlipidemia, unspecified: Secondary | ICD-10-CM | POA: Insufficient documentation

## 2016-10-10 DIAGNOSIS — J189 Pneumonia, unspecified organism: Secondary | ICD-10-CM | POA: Insufficient documentation

## 2016-10-10 DIAGNOSIS — J449 Chronic obstructive pulmonary disease, unspecified: Secondary | ICD-10-CM | POA: Diagnosis not present

## 2016-10-10 DIAGNOSIS — Z7982 Long term (current) use of aspirin: Secondary | ICD-10-CM | POA: Insufficient documentation

## 2016-10-10 NOTE — Telephone Encounter (Signed)
Oncology Nurse Navigator Documentation  Oncology Nurse Navigator Flowsheets 10/10/2016  Navigator Location [facility]  Navigator Encounter Type Telephone/I called [name] to check and see how her appt with her GI doctor went.  I asked that she call me when she can. I left my name and phone number to call.   Telephone Outgoing Call  Treatment Phase Pre-Tx/Tx Discussion  Barriers/Navigation Needs Coordination of Care  Interventions Coordination of Care  Coordination of Care Other  Acuity Level 1  Acuity Level 1 Minimal follow up required  Time Spent with Patient 15

## 2016-10-11 DIAGNOSIS — R0600 Dyspnea, unspecified: Secondary | ICD-10-CM | POA: Diagnosis not present

## 2016-10-16 ENCOUNTER — Telehealth: Payer: Self-pay | Admitting: *Deleted

## 2016-10-16 NOTE — Telephone Encounter (Signed)
Oncology Nurse Navigator Documentation  Oncology Nurse Navigator Flowsheets 10/16/2016  Navigator Location [facility]  Navigator Encounter Type Telephone  Telephone Outgoing Call/I called [name] to follow up with her today.  I was unable to reach her.  I left vm message updating her on her appt with Dr. Servando Snare and to call me if needed.   Treatment Phase Pre-Tx/Tx Discussion  Barriers/Navigation Needs Education  Education Other  Interventions Education  Education Method Verbal  Acuity Level 1  Acuity Level 1 Minimal follow up required  Time Spent with Patient 15

## 2016-10-17 ENCOUNTER — Ambulatory Visit: Payer: Medicare Other | Admitting: Cardiothoracic Surgery

## 2016-10-18 ENCOUNTER — Ambulatory Visit (INDEPENDENT_AMBULATORY_CARE_PROVIDER_SITE_OTHER): Payer: Medicare Other | Admitting: Cardiothoracic Surgery

## 2016-10-18 ENCOUNTER — Encounter: Payer: Self-pay | Admitting: Cardiothoracic Surgery

## 2016-10-18 ENCOUNTER — Other Ambulatory Visit: Payer: Self-pay | Admitting: *Deleted

## 2016-10-18 VITALS — BP 112/71 | HR 62 | Resp 16 | Ht 63.5 in | Wt 134.0 lb

## 2016-10-18 DIAGNOSIS — C3411 Malignant neoplasm of upper lobe, right bronchus or lung: Secondary | ICD-10-CM | POA: Diagnosis not present

## 2016-10-18 NOTE — Progress Notes (Signed)
[name] [address]       [place] [address]             [phone]                    [name] [facility] Medical Record #[id number] Date of Birth: 1945-11-20  Referring: Hannah Bears, MD Primary Care: Hannah Lyons, MD  Chief Complaint:   Lung Cancer  History of Present Illness:    [name] 71 y.o. female is seen in the office  today for new dx of Squamous cell lung cancer right lung. She has a historyof tobacco use (45 pack years), hypertension, hyperlipidemia, allergic rhinitis. She followed in the pulmonary clinic with  diagnosis of asthma made by about 6-8 yrs ago . She has noted  had slow progressive exertional dyspnea, underwent a CT scan of her chest 08/17/16 that shows a rounded RUL nodule with some spiculation. No cough, no sputum or hemoptysis. No CP or pleuritic sx.   She underwent bronchoscopy 08/29/16. 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  Pulmonary function testing was done on 08/24/16. This shows severe obstruction without a bronchodilator response FEV1 1.22L (57% predicted), hyperinflation, decreased diffusion capacity that does not correct the normal range for alveolar volume. She has been on Symbicort for 2 years but not regularly. Has flonase and loratadine. Uses albuterol about once a week.    She denies any current GI complains   Diagnosis Lung, biopsy, Right Upper Lobe - SQUAMOUS CELL CARCINOMA. Microscopic Comment Dr. Orene Long agrees. (JDP:gt, 08/30/16) Hannah Laws MD Pathologist, Electronic Signature (Case signed 08/30/2016)   Current Activity/ Functional Status:  Patient is independent with mobility/ambulation, transfers, ADL's, IADL's.   Zubrod Score: At the time of surgery this patient's most appropriate activity status/level should be described as: '[]'$     0    Normal activity, no symptoms '[x]'$     1    Restricted in physical strenuous activity but ambulatory, able to do out light  work '[]'$     2    Ambulatory and capable of self care, unable to do work activities, up and about               >50 % of waking hours                              '[]'$     3    Only limited self care, in bed greater than 50% of waking hours '[]'$     4    Completely disabled, no self care, confined to bed or chair '[]'$     5    Moribund   Past Medical History:  Diagnosis Date  . Arthritis   . Asthma    asthmatic bronchitis  . COPD (chronic obstructive pulmonary disease) ([place])   . Dyspnea   . History of kidney stones   . Hyperlipidemia   . Hypertension   . Pneumonia   . PONV (postoperative nausea and vomiting)    none with her hip surgery  . Restless leg syndrome   . Stage I squamous cell carcinoma of right lung ([place]) 09/11/2016    Past Surgical History:  Procedure Laterality Date  . ABDOMINAL HYSTERECTOMY    . CARDIAC CATHETERIZATION    . COLONOSCOPY    . CYST EXCISION     from back  . CYSTO    .  DENTAL EXAMINATION UNDER ANESTHESIA    . EYE SURGERY Left    cataract surgery with lens implant  . TONSILLECTOMY    . TOTAL HIP ARTHROPLASTY  10/31/2012   Procedure: TOTAL HIP ARTHROPLASTY ANTERIOR APPROACH;  Surgeon: Hannah Rossetti, MD;  Location: [facility];  Service: Orthopedics;  Laterality: Right;  Right Total Hip Replacement  . VIDEO BRONCHOSCOPY WITH ENDOBRONCHIAL NAVIGATION N/A 08/29/2016   Procedure: VIDEO BRONCHOSCOPY WITH ENDOBRONCHIAL NAVIGATION;  Surgeon: Hannah Gobble, MD;  Location: [facility];  Service: Thoracic;  Laterality: N/A;    No family history on file.  Social History   Social History  . Marital status: Married    Spouse name: N/A  . Number of children: N/A  . Years of education: N/A   Occupational History  . Not on file.   Social History Main Topics  . Smoking status: Former Smoker    Packs/day: 0.50    Quit date: 08/29/2016  . Smokeless tobacco: Never Used     Comment: down to 1-2 a day  . Alcohol use No  . Drug use: No  . Sexual activity: Not on  file   Other Topics Concern  . Not on file   Social History Narrative  . No narrative on file    History  Smoking Status  . Former Smoker  . Packs/day: 0.50  . Quit date: 08/29/2016  Smokeless Tobacco  . Never Used    Comment: down to 1-2 a day    History  Alcohol Use No     Allergies  Allergen Reactions  . Nickel Rash    Current Outpatient Prescriptions  Medication Sig Dispense Refill  . albuterol (PROVENTIL HFA;VENTOLIN HFA) 108 (90 BASE) MCG/ACT inhaler Inhale 2 puffs into the lungs every 6 (six) hours as needed. For shortness of breath    . aspirin EC 81 MG tablet Take 81 mg by mouth daily.    . Budesonide-Formoterol Fumarate (SYMBICORT IN) Inhale 2 puffs into the lungs daily.    . Calcium Carbonate-Vitamin D (CALCIUM 600 + D PO) Take 1 tablet by mouth 2 (two) times daily.    [name] diltiazem (TIAZAC) 300 MG 24 hr capsule Take 300 mg by mouth every morning.    . diphenhydrAMINE (BENADRYL) 25 mg capsule Take 50 mg by mouth every 6 (six) hours as needed. For allergies    . fluticasone (FLONASE) 50 MCG/ACT nasal spray Place 2 sprays into the nose daily as needed. For nasal decongestant    . loratadine (CLARITIN) 10 MG tablet Take 10 mg by mouth daily as needed for allergies.    . simvastatin (ZOCOR) 20 MG tablet Take 20 mg by mouth every morning.    . sodium chloride (OCEAN) 0.65 % nasal spray Place 1 spray into the nose daily as needed. For nasal decongestant    . telmisartan (MICARDIS) 40 MG tablet Take 40 mg by mouth every morning.    . Tiotropium Bromide-Olodaterol (STIOLTO RESPIMAT) 2.5-2.5 MCG/ACT AERS Inhale 2 puffs into the lungs daily. 1 Inhaler 0  . triamterene-hydrochlorothiazide (MAXZIDE-25) 37.5-25 MG per tablet Take 1 tablet by mouth every morning.    . zolpidem (AMBIEN) 10 MG tablet Take 10 mg by mouth at bedtime as needed. For sleep    . ALPRAZolam (XANAX) 1 MG tablet Take tablet 30 minutes prior to MRI. (Patient not taking: Reported on 10/18/2016) 1 tablet 0    No current facility-administered medications for this visit.       Review of Systems:  Cardiac Review of Systems: Y or N  Chest Pain [  n  ]  Resting SOB [n ] Exertional SOB  [ y ]  Vertell Limber Florencio.Farrier  ]   Pedal Edema [ n  ]    Palpitations [n  ] Syncope  Florencio.Farrier ]   Presyncope [  n ]  General Review of Systems: [Y] = yes [  ]=no Constitional: recent weight change [ n ];  Wt loss over the last 3 months [   ] anorexia [  ]; fatigue [ y ]; nausea [  ]; night sweats [  ]; fever [  ]; or chills [  ];          Dental: poor dentition[  ]; Last Dentist visit:   Eye : blurred vision [  ]; diplopia [   ]; vision changes [  ];  Amaurosis fugax[  ]; Resp: cough [ y ];  wheezing[ y ];  hemoptysis[ n]; shortness of breath[ y ]; paroxysmal nocturnal dyspnea[  ]; dyspnea on exertion[ y ]; or orthopnea[  ];  GI:  gallstones[  ], vomiting[  ];  dysphagia[  ]; melena[  ];  hematochezia [  ]; heartburn[  ];   Hx of  Colonoscopy[  ]; GU: kidney stones [  ]; hematuria[  ];   dysuria [  ];  nocturia[  ];  history of     obstruction [  ]; urinary frequency [  ]             Skin: rash, swelling[  ];, hair loss[  ];  peripheral edema[  ];  or itching[  ]; Musculosketetal: myalgias[  ];  joint swelling[  ];  joint erythema[  ];  joint pain[  ];  back pain[  ];  Heme/Lymph: bruising[  ];  bleeding[  ];  anemia[  ];  Neuro: TIA[n  ];  headaches[  ];  stroke[  ];  vertigo[  ];  seizures[ n ];   paresthesias[n  ];  difficulty walking[ n ];  Psych:depression[  ]; anxiety[  ];  Endocrine: diabetes[ n ];  thyroid dysfunction[ n ];  Immunizations: Flu up to date Blue.Reese  ]; Pneumococcal up to date [ y ];  Other:  Physical Exam: BP 112/71 (BP Location: Left Arm, Patient Position: Sitting, Cuff Size: Normal)   Pulse 62   Resp 16   Ht 5' 3.5" (1.613 m)   Wt 134 lb (60.8 kg)   SpO2 99% Comment: ON RA  BMI 23.36 kg/m   PHYSICAL EXAMINATION: General appearance: alert, cooperative, appears older than stated age and no  distress Head: Normocephalic, without obvious abnormality, atraumatic Neck: no adenopathy, no carotid bruit, no JVD, supple, symmetrical, trachea midline and thyroid not enlarged, symmetric, no tenderness/mass/nodules Lymph nodes: Cervical, supraclavicular, and axillary nodes normal. Resp: clear to auscultation bilaterally Back: symmetric, no curvature. ROM normal. No CVA tenderness. Cardio: regular rate and rhythm, S1, S2 normal, no murmur, click, rub or gallop GI: soft, non-tender; bowel sounds normal; no masses,  no organomegaly Extremities: extremities normal, atraumatic, no cyanosis or edema Neurologic: Grossly normal   Diagnostic Studies & Laboratory data:     Recent Radiology Findings:   Mr Jeri Cos Wo Contrast  Result Date: 09/14/2016 CLINICAL DATA:  71 y/o  F; lung cancer. EXAM: MRI HEAD WITHOUT AND WITH CONTRAST TECHNIQUE: Multiplanar, multiecho pulse sequences of the brain and surrounding structures were obtained without and with intravenous contrast. CONTRAST:  49m MULTIHANCE GADOBENATE DIMEGLUMINE 529  MG/ML IV SOLN COMPARISON:  None. FINDINGS: Brain: No acute infarction, hemorrhage, hydrocephalus, extra-axial collection or mass lesion. 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 Scattered nonspecific foci of T2 FLAIR hyperintense signal abnormality in subcortical and periventricular white matter compatible with mild chronic microvascular ischemic changes for age. Mild brain parenchymal volume loss. No abnormal enhancement. Vascular: Normal flow voids. Skull and upper cervical spine: Normal marrow signal. Sinuses/Orbits: Negative. Other: None. IMPRESSION: 1. No evidence for intracranial metastasis. 2. Mild chronic microvascular ischemic changes and parenchymal volume loss of the brain. Electronically Signed   By: Kristine Garbe M.D.   On: 09/14/2016 20:29   Nm Pet Image Initial (pi) Skull Base To Thigh  Result Date: 09/14/2016 CLINICAL DATA:  Initial treatment strategy for right  upper lobe squamous cell carcinoma. EXAM: NUCLEAR MEDICINE PET SKULL BASE TO THIGH TECHNIQUE: 6.5 mCi F-18 FDG was injected intravenously. Full-ring PET imaging was performed from the skull base to thigh after the radiotracer. CT data was obtained and used for attenuation correction and anatomic localization. FASTING BLOOD GLUCOSE:  Value: 94 mg/dl COMPARISON:  Chest CT 08/17/2016 FINDINGS: NECK No hypermetabolic cervical lymph nodes are identified.There are no lesions of the pharyngeal mucosal space. CHEST There are no hypermetabolic mediastinal, hilar or axillary lymph nodes. The dominant irregular, right upper lobe mass is hypermetabolic within SUV max of 13.1. It measures approximately 4.8 x 4.3 cm on image 25. Adjacent satellite nodularity is also hypermetabolic. There is no abnormal metabolic activity or suspicious nodularity elsewhere in the right lung or in the left lung. There is no significant pleural or pericardial effusion. Atherosclerosis of the aorta, great vessels and coronary arteries noted. ABDOMEN/PELVIS There is no hypermetabolic activity within the liver, adrenal glands, spleen or pancreas. There is no hypermetabolic nodal activity. There is prominent metabolic activity within the gastric cardia (SUV max 9.2). This corresponds with soft tissue thickening on the CT images. This could reflect a proximal gastric mass or inflammation. Cortical scarring and calcifications are present within the left kidney. There is atherosclerosis of the aorta and its branch vessels. There is diverticulosis of the distal colon. SKELETON There is no hypermetabolic activity to suggest osseous metastatic disease. Previous right total hip arthroplasty noted. IMPRESSION: 1. The biopsy-proven right upper lobe squamous cell carcinoma is hypermetabolic. There are adjacent satellite nodules. Some of this opacity may be secondary to postobstructive pneumonitis. 2. No nodal or distant metastases demonstrated. By imaging, this  is most consistent with stage IB disease (T2a N0 M0). 3. Hypermetabolic activity within the gastric cardia suspicious for possible gastric neoplasm versus inflammation. Further evaluation with upper endoscopy recommended. Electronically Signed   By: Richardean Sale M.D.   On: 09/14/2016 12:50     I have independently reviewed the above radiologic studies.  Recent Lab Findings: Lab Results  Component Value Date   WBC 9.8 09/11/2016   HGB 14.0 09/11/2016   HCT 42.2 09/11/2016   PLT 236 09/11/2016   GLUCOSE 106 09/11/2016   ALT 18 09/11/2016   AST 15 09/11/2016   NA 141 09/11/2016   K 3.9 09/11/2016   CL 101 08/28/2016   CREATININE 0.9 09/11/2016   BUN 19.7 09/11/2016   CO2 30 (H) 09/11/2016   INR 0.95 10/27/2012    PFT's Dated 08/24/2016 FEV1: 1.22 57% DLCO 12.02  52% The FEV1, FEV1/FVC ratio and FEF25-75% are reduced indicating airway obstruction. The TLC, FRC and RV are increased indicating overinflation. Following administration of bronchodilators, there is no significant response. The reduced diffusing capacity  indicates a moderately severe loss of functional alveolar capillary surface. 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 Demographics Age: 21 Ht. (in.) 63.5 Wt. (lb) 135.8 BMI: 23.7   Predicted Peak VO2: 18.2 Gender: Female Ht (cm) 161.3 Wt. (kg) 61.6  Results Pre-Exercise PFTs  FVC 2.73 (99%)    FEV1 1.21 (56%)     FEV1/FVC 44 (56%)     MVV 47 (55%) Exercise Time:  8:45      Watts: 80 RPE: 18 Reason stopped: Patient ended test due to leg fatigue and dyspnea (8/10) Additional symptoms: None reported Resting  HR: 75 Peak HR: 129  (87% age predicted max HR) BP rest: 100/62 BP peak: 164/68 Peak VO2: 18.0 (99% predicted peak VO2) VE/VCO2 slope: 34 UES: 1.31 Peak RER: 1.09 Ventilatory Threshold: 15.5 (85% predicted or measured peak VO2) Peak RR 37 Peak Ventilation: 42.9 VE/MVV: 91.3% PETCO2 at peak: 33 O2pulse: 9  (113% predicted O2pulse)  Interpretation  Notes: Patient gave a very good effort despite her leg fatigue. Pulse-oximetry remained 95% (lowest at peak exercise) or above for the duration of exercise. Exercise was performed on a cycle ergometer starting at [facility] and increasing by 10W/min.  ECG: Resting ECG in normal sinus rhythm. HR response appropriate. There were occasional PVCs at higher intensity exercise, however there were no sustained arrhythmias and no ST-T changes. BP response appropriate for the work performed.   PFT: Pre-exercise spirometry demonstrates moderate to severe obstructive patterns. MVV below normal.   CPX: Exercise Capacity- Exercise testing with gas exchange demonstrates a normal peak VO2 of 18.0 ml/kg/min (99% of the age/gender/weight matched sedentary norms). The RER of 1.09 indicates a near maximal effort.   Cardiovascular response- The O2pulse (a surrogate for stroke volume) increased mildly linearly with exercise intensity reaching9 ml/beat (113% predicted). DeltaVO2/Delta WR is 9.7 at peak exercise Indicating no evidence of cardiovascular impairment.  Ventilatory response- The VE/VCO2 slope is elevated and indicates Increased dead space ventilation. The oxygen uptake efficiency slope (OUES) is normal. The VO2 at the ventilatory threshold was normal at 85% of the predicted peak VO2. At peak exercise, the ventilation reached 91% of the measured MVV and breathing reserve was 4.1 indicating ventilatory limits were reached. PETCO2 was mildly low at 33 mmHg during peak exercise.    Conclusion: Exercise testing with gas exchange demonstrates normal functional  capacity when compared to matched sedentary norms. There is no indication for cardiovascular impairment. Patient is primarily ventilatory limited and is clearly of obstructive nature. VE/VCO2 is elevated and indicates noted significant dead space ventilation mostly due to obstructive lung disease. Patient's measured VO32mx is normal at 18.0 ml/kg/min and meets CPX criteria for lobectomy candidacy in risk stratification. Test, report and preliminary impression by: KLandis Martins MS, ACSM-RCEP 10/10/2016 2:48 PM FINALIZED PMarshell GarfinkelMD [facility] Pager [phone] 10/11/2016, 2:39 AM   Assessment / Plan:   Clinical stage IB (cT2a,cN0,cM0) non small cell lung cancer  Moderately severe Obstructive Airways Disease -Emphysematous Type  Unknown hypermetabolic "mass in stomach" -  [facility] gastritis, no biopsies were indicated I discussed with the patient and her husband  in detail the risks and options of surgical resection of  the right upper lobe. Although she does have some pulmonary limitations her CPX testing would indicate that she could tolerate lobectomy., She is very motivated to do so and she will is aware that the best chance for cure of her lung cancer is with surgical resection. She has a appointment for cardiology clearance tomorrow   we'll plan to proceed with bronchoscopy right VATS lung resection January 15.   [name] MD      [address] [place] [address] Office [phone]   Beeper [phone]  10/18/2016 12:38 PM

## 2016-10-19 ENCOUNTER — Encounter (HOSPITAL_COMMUNITY): Payer: Medicare Other

## 2016-10-19 DIAGNOSIS — E7800 Pure hypercholesterolemia, unspecified: Secondary | ICD-10-CM | POA: Diagnosis not present

## 2016-10-19 DIAGNOSIS — C3490 Malignant neoplasm of unspecified part of unspecified bronchus or lung: Secondary | ICD-10-CM | POA: Diagnosis not present

## 2016-10-19 DIAGNOSIS — I10 Essential (primary) hypertension: Secondary | ICD-10-CM | POA: Diagnosis not present

## 2016-10-19 DIAGNOSIS — Z01818 Encounter for other preprocedural examination: Secondary | ICD-10-CM | POA: Diagnosis not present

## 2016-10-22 DIAGNOSIS — E7800 Pure hypercholesterolemia, unspecified: Secondary | ICD-10-CM | POA: Diagnosis not present

## 2016-10-22 DIAGNOSIS — Z01818 Encounter for other preprocedural examination: Secondary | ICD-10-CM | POA: Diagnosis not present

## 2016-10-22 DIAGNOSIS — I10 Essential (primary) hypertension: Secondary | ICD-10-CM | POA: Diagnosis not present

## 2016-10-23 ENCOUNTER — Telehealth: Payer: Self-pay | Admitting: *Deleted

## 2016-10-23 ENCOUNTER — Encounter: Payer: Self-pay | Admitting: *Deleted

## 2016-10-23 NOTE — Telephone Encounter (Signed)
Oncology Nurse Navigator Documentation  Oncology Nurse Navigator Flowsheets 10/23/2016  Navigator Location [facility]  Navigator Encounter Type Telephone/I called [name] to follow up on her smoking cessation.  She is set up for surgery on 10/29/16 and I wanted to check on her smoking cessation.  I was unable to reach her and left her a vm message to call if needing some assistance.    Telephone Outgoing Call  Treatment Phase Pre-Tx/Tx Discussion  Barriers/Navigation Needs Education  Education Smoking cessation  Interventions Education  Education Method Verbal  Acuity Level 1  Time Spent with Patient 15

## 2016-10-23 NOTE — Progress Notes (Signed)
Oncology Nurse Navigator Documentation  Oncology Nurse Navigator Flowsheets 10/23/2016  Navigator Location [facility]  Navigator Encounter Type Telephone/[name] called me back today.  I checked on how she was feeling with her upcoming surgery.  I listened as she explained.  I follow up on her smoking cessation.  She states she has quit.  I will send her a certificate for encouragement.  I updated her on the benefits of quitting smoking before surgery.  She was thankful for the call and will call me if needed.   Telephone Incoming Call  Treatment Phase Follow-up  Barriers/Navigation Needs Education  Education Smoking cessation;Other  Interventions Education  Education Method Verbal  Acuity Level 1  Acuity Level 1 Minimal follow up required  Time Spent with Patient 15

## 2016-10-24 ENCOUNTER — Encounter: Payer: Self-pay | Admitting: *Deleted

## 2016-10-24 NOTE — Pre-Procedure Instructions (Signed)
[name]  10/24/2016      [facility] [address], [place] - [place] [address] Phone: [phone] Fax: [phone]  [place], [place] - [place] [address] [place] [address] Phone: [phone] Fax: [phone]  [facility] [place], [place] [address] Phone: [phone] Fax: [phone]    Your procedure is scheduled on January 15  Report to [facility] at [place].M.  Call this number if you have problems the morning of surgery:  [phone]   Remember:  Do not eat food or drink liquids after midnight.   Take these medicines the morning of surgery with A SIP OF WATER albuterol (PROVENTIL HFA;VENTOLIN HFA) bring with you, diltiazem (TIAZAC), fluticasone (FLONASE), loratadine (CLARITIN), meclizine (ANTIVERT), eye drops, Tiotropium Bromide-Olodaterol (STIOLTO RESPIMAT)  7 days prior to surgery STOP taking any Aspirin, Aleve, Naproxen, Ibuprofen, Motrin, Advil, Goody's, BC's, all herbal medications, fish oil, and all vitamins    Do not wear jewelry, make-up or nail polish.  Do not wear lotions, powders, or perfumes, or deoderant.  Do not shave 48 hours prior to surgery.    Do not bring valuables to the hospital.  [facility] is not responsible for any belongings or valuables.  Contacts, dentures or bridgework may not be worn into surgery.  Leave your suitcase in the car.  After surgery it may be brought to your room.  For patients admitted to the hospital, discharge time will be determined by your treatment team.  Patients discharged the day of surgery will not be allowed to drive home.    [facility]- Preparing For Surgery  Before surgery, you can play an important role. Because skin is not sterile, your skin needs to be as free of germs as possible. You can reduce the number of germs on  your skin by washing with CHG (chlorahexidine gluconate) Soap before surgery.  CHG is an antiseptic cleaner which kills germs and bonds with the skin to continue killing germs even after washing.  Please do not use if you have an allergy to CHG or antibacterial soaps. If your skin becomes reddened/irritated stop using the CHG.  Do not shave (including legs and underarms) for at least 48 hours prior to first CHG shower. It is OK to shave your face.  Please follow these instructions carefully.   1. Shower the NIGHT BEFORE SURGERY and the MORNING OF SURGERY with CHG.   2. If you chose to wash your hair, wash your hair first as usual with your normal shampoo.  3. After you shampoo, rinse your hair and body thoroughly to remove the shampoo.  4. Use CHG as you would any other liquid soap. You can apply CHG directly to the skin and wash gently with a scrungie or a clean washcloth.   5. Apply the CHG Soap to your body ONLY FROM THE NECK DOWN.  Do not use on open wounds or open sores. Avoid contact with your eyes, ears, mouth and genitals (private parts). Wash genitals (private parts) with your normal soap.  6. Wash thoroughly, paying special attention to the area where your surgery will be performed.  7. Thoroughly rinse your body with warm water from the neck down.  8. DO NOT shower/wash with your normal soap after using and rinsing off the CHG Soap.  9. Pat yourself  dry with a CLEAN TOWEL.   10. Wear CLEAN PAJAMAS   11. Place CLEAN SHEETS on your bed the night of your first shower and DO NOT SLEEP WITH PETS.    Day of Surgery: Do not apply any deodorants/lotions. Please wear clean clothes to the hospital/surgery center.       Please read over the following fact sheets that you were given.

## 2016-10-25 ENCOUNTER — Encounter (HOSPITAL_COMMUNITY)
Admission: RE | Admit: 2016-10-25 | Discharge: 2016-10-25 | Disposition: A | Discharge status: Home / Self Care | Payer: Medicare Other | Source: Ambulatory Visit | Attending: Cardiothoracic Surgery | Admitting: Cardiothoracic Surgery

## 2016-10-25 ENCOUNTER — Ambulatory Visit (HOSPITAL_COMMUNITY)
Admission: RE | Admit: 2016-10-25 | Discharge: 2016-10-25 | Disposition: A | Discharge status: Home / Self Care | Payer: Medicare Other | Source: Ambulatory Visit | Attending: Cardiothoracic Surgery | Admitting: Cardiothoracic Surgery

## 2016-10-25 ENCOUNTER — Encounter (HOSPITAL_COMMUNITY): Payer: Self-pay

## 2016-10-25 DIAGNOSIS — Z01818 Encounter for other preprocedural examination: Secondary | ICD-10-CM | POA: Diagnosis not present

## 2016-10-25 DIAGNOSIS — C3411 Malignant neoplasm of upper lobe, right bronchus or lung: Secondary | ICD-10-CM | POA: Insufficient documentation

## 2016-10-25 LAB — URINALYSIS, ROUTINE W REFLEX MICROSCOPIC
Bilirubin Urine: NEGATIVE
Glucose, UA: NEGATIVE mg/dL
Hgb urine dipstick: NEGATIVE
Ketones, ur: NEGATIVE mg/dL
Leukocytes, UA: NEGATIVE
Nitrite: NEGATIVE
Protein, ur: NEGATIVE mg/dL
Specific Gravity, Urine: 1.003 — ABNORMAL LOW (ref 1.005–1.030)
pH: 7 (ref 5.0–8.0)

## 2016-10-25 LAB — CBC
HCT: 41.8 % (ref 36.0–46.0)
Hemoglobin: 14.2 g/dL (ref 12.0–15.0)
MCH: 31.1 pg (ref 26.0–34.0)
MCHC: 34 g/dL (ref 30.0–36.0)
MCV: 91.5 fL (ref 78.0–100.0)
Platelets: 260 10*3/uL (ref 150–400)
RBC: 4.57 MIL/uL (ref 3.87–5.11)
RDW: 13.7 % (ref 11.5–15.5)
WBC: 8.3 10*3/uL (ref 4.0–10.5)

## 2016-10-25 LAB — BLOOD GAS, ARTERIAL
Acid-Base Excess: 2.8 mmol/L — ABNORMAL HIGH (ref 0.0–2.0)
Bicarbonate: 26.4 mmol/L (ref 20.0–28.0)
Drawn by: 449841
FIO2: 21
O2 Saturation: 94.9 %
Patient temperature: 98.6
pCO2 arterial: 37.8 mmHg (ref 32.0–48.0)
pH, Arterial: 7.458 — ABNORMAL HIGH (ref 7.350–7.450)
pO2, Arterial: 73.5 mmHg — ABNORMAL LOW (ref 83.0–108.0)

## 2016-10-25 LAB — SURGICAL PCR SCREEN
MRSA, PCR: NEGATIVE
Staphylococcus aureus: NEGATIVE

## 2016-10-25 LAB — APTT: aPTT: 29 seconds (ref 24–36)

## 2016-10-25 LAB — COMPREHENSIVE METABOLIC PANEL
ALT: 19 U/L (ref 14–54)
AST: 22 U/L (ref 15–41)
Albumin: 4 g/dL (ref 3.5–5.0)
Alkaline Phosphatase: 56 U/L (ref 38–126)
Anion gap: 11 (ref 5–15)
BUN: 19 mg/dL (ref 6–20)
CO2: 23 mmol/L (ref 22–32)
Calcium: 9.7 mg/dL (ref 8.9–10.3)
Chloride: 102 mmol/L (ref 101–111)
Creatinine, Ser: 0.91 mg/dL (ref 0.44–1.00)
GFR calc Af Amer: >60 mL/min (ref >60)
GFR calc non Af Amer: >60 mL/min (ref >60)
Glucose, Bld: 92 mg/dL (ref 65–99)
Potassium: 4.2 mmol/L (ref 3.5–5.1)
Sodium: 136 mmol/L (ref 135–145)
Total Bilirubin: 0.7 mg/dL (ref 0.3–1.2)
Total Protein: 7 g/dL (ref 6.5–8.1)

## 2016-10-25 LAB — PROTIME-INR
INR: 0.99
Prothrombin Time: 13.1 seconds (ref 11.4–15.2)

## 2016-10-25 LAB — TYPE AND SCREEN
ABO/RH(D): A POS
Antibody Screen: NEGATIVE

## 2016-10-25 LAB — ABO/RH: ABO/RH(D): A POS

## 2016-10-25 NOTE — Progress Notes (Signed)
It is ok to give patient information to the following individuals  Son Shaniqwa Horsman [phone] Brother Edwina Barth [phone]

## 2016-10-25 NOTE — Progress Notes (Addendum)
PCP - [name] Cardiologist - vyas for Clearance requesting records  Chest x-ray - 10/25/16 - Thurmond Butts said that this xray was good for surgery EKG - 10/22/16 requesting Stress Test - 10/10/16 ECHO - denies Cardiac Cath - 20+ years ago Sleep Study - denies   Fasting Blood Sugar - n/a  Checks Blood Sugar __n/a___ times a day  CPAP - n/a  Patient was instructed by surgeon that Aspirin is ok to take   Patient denies shortness of breath, fever, cough and chest pain at PAT appointment   Patient verbalized understanding of instructions that was given to them at the PAT appointment. Patient expressed that there were no further questions.  Patient was also instructed that they will need to review over the PAT instructions again at home before the surgery.

## 2016-10-26 NOTE — Progress Notes (Signed)
Anesthesia Chart Review: Patient is a 71 year old female scheduled for video bronchoscopy, right VATS, lung resection on 10/29/16 by Dr. Servando Snare.  History includes recent former smoker (quit 08/29/16), post-operative N/V, HTN, HLD, asthma, arthritis, COPD, nephrolithiasis, RLS, bronchoscopy with electromagnetic navigation/biopsies 08/29/16 (stage 1 SCC right lung cancer), right THA '14, tonsillectomy, hysterectomy.   PCP is Dr. [name]. She was referred to Dr. Vear Clock at [facility] and was seen on 10/19/16 for a preoperative evaluation. PRN cardiology follow-up recommended following non-ischemic stress test. She was cleared at low CV risk and given permission to hold ASA for 7 days prior to surgery.  Meds include albuterol, aspirin 81 mg, Tiazac, Flonase, Claritin, meclizine, Zocor, telmisartan, tetrahydrozoline ophthalmic, Maxide, Ambien, Stiolto Respimat.  BP 115/73   Pulse 75   Temp 36.6 C   Resp 18   Ht 5' 3.5" (1.613 m)   Wt 136 lb 8 oz (61.9 kg)   SpO2 96%   BMI 23.80 kg/m   EKG 10/19/16 [facility] CV): SR, PACs, poor r wave progression.  Nuclear stress test 10/22/16 [facility] CV): Impression: 1. The resting ECG demonstrated normal sinus rhythm, normal resting conduction, no resting arrhythmias and normal rest repolarization. Stress ECG is nondiagnostic for ischemia as it is a pharmacologic stress using Lexiscan. Stress symptoms included dyspnea. 2. Myocardial perfusion imaging is normal. Overall left ventricular systolic function was normal without regional wall motion abnormalities. The left ventricular ejection fraction was 75%. 3. This is a normal/low risk study, based on the study, patient can be taken up for the surgery with acceptable CV risk.   Cardiopulmonary Exercise Test 10/10/16: Conclusion: Exercise testing with gas exchange demonstrates normal functional capacity when compared to matched sedentary norms. There is no indication for cardiovascular  impairment. Patient is primarily ventilatory limited and is clearly of obstructive nature. VE/VCO2 is elevated and indicates noted significant dead space ventilation mostly due to obstructive lung disease. Patient's measured VO79mx is normal at 18.0 ml/kg/min and meets CPX criteria for lobectomy candidacy in risk stratification.  PET scan 09/14/16: IMPRESSION: 1. The biopsy-proven right upper lobe squamous cell carcinoma is hypermetabolic. There are adjacent satellite nodules. Some of this opacity may be secondary to postobstructive pneumonitis. 2. No nodal or distant metastases demonstrated. By imaging, this is most consistent with stage IB disease (T2a N0 M0). 3. Hypermetabolic activity within the gastric cardia suspicious for possible gastric neoplasm versus inflammation. Further evaluation with upper endoscopy recommended.  (According to 10/18/16 note by Dr. GServando Snare "Unknown hypermetabolic "mass in stomach" -  [facility] demonstrated gastritis, no biopsies were indicated.")  PFT's 08/24/2016: FEV1: 1.22 57% DLCO 12.02  52% The FEV1, FEV1/FVC ratio and FEF25-75% are reduced indicating airway obstruction. The TLC, FRC and RV are increased indicating overinflation. Following administration of bronchodilators, there is no significant response. The reduced diffusing capacity indicates a moderately severe loss of functional alveolar capillary surface. 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   If no acute changes then I anticipate that she can proceed as planned.  AGeorge HughMParis Community Hospital[facility]/Anesthesiology Phone ([phone]/09/2017 11:09 AM

## 2016-10-28 ENCOUNTER — Encounter (HOSPITAL_COMMUNITY): Payer: Self-pay | Admitting: Anesthesiology

## 2016-10-28 NOTE — Anesthesia Preprocedure Evaluation (Addendum)
Anesthesia Evaluation  Patient identified by MRN, date of birth, ID band Patient awake    Reviewed: Allergy & Precautions, H&P , NPO status , Patient's Chart, lab work & pertinent test results  History of Anesthesia Complications (+) PONV  Airway Mallampati: II  TM Distance: >3 FB Neck ROM: Full    Dental no notable dental hx. (+) Teeth Intact, Dental Advisory Given   Pulmonary shortness of breath, asthma , COPD,  COPD inhaler, former smoker,    Pulmonary exam normal breath sounds clear to auscultation       Cardiovascular hypertension, Pt. on medications  Rhythm:Regular Rate:Normal     Neuro/Psych negative neurological ROS     GI/Hepatic negative GI ROS, Neg liver ROS,   Endo/Other  negative endocrine ROS  Renal/GU negative Renal ROS  negative genitourinary   Musculoskeletal  (+) Arthritis ,   Abdominal   Peds  Hematology negative hematology ROS (+)   Anesthesia Other Findings   Reproductive/Obstetrics negative OB ROS                            Anesthesia Physical Anesthesia Plan  ASA: III  Anesthesia Plan: General   Post-op Pain Management:    Induction: Intravenous  Airway Management Planned: Double Lumen EBT  Additional Equipment: Arterial line, CVP and Ultrasound Guidance Line Placement  Intra-op Plan:   Post-operative Plan: Extubation in OR and Possible Post-op intubation/ventilation  Informed Consent: I have reviewed the patients History and Physical, chart, labs and discussed the procedure including the risks, benefits and alternatives for the proposed anesthesia with the patient or authorized representative who has indicated his/her understanding and acceptance.   Dental advisory given  Plan Discussed with: CRNA  Anesthesia Plan Comments:         Anesthesia Quick Evaluation

## 2016-10-29 ENCOUNTER — Inpatient Hospital Stay (HOSPITAL_COMMUNITY): Payer: Medicare Other | Admitting: Vascular Surgery

## 2016-10-29 ENCOUNTER — Encounter (HOSPITAL_COMMUNITY)
Admission: RE | Disposition: A | Discharge status: Rehabilitation Facility | Payer: Self-pay | Source: Ambulatory Visit | Attending: Cardiothoracic Surgery

## 2016-10-29 ENCOUNTER — Inpatient Hospital Stay (HOSPITAL_COMMUNITY): Payer: Medicare Other | Admitting: Anesthesiology

## 2016-10-29 ENCOUNTER — Inpatient Hospital Stay (HOSPITAL_COMMUNITY): Payer: Medicare Other

## 2016-10-29 ENCOUNTER — Inpatient Hospital Stay (HOSPITAL_COMMUNITY)
Admission: RE | Admit: 2016-10-29 | Discharge: 2016-12-05 | DRG: 163 | Disposition: A | Discharge status: Rehabilitation Facility | Payer: Medicare Other | Source: Ambulatory Visit | Attending: Cardiothoracic Surgery | Admitting: Cardiothoracic Surgery

## 2016-10-29 DIAGNOSIS — Z96641 Presence of right artificial hip joint: Secondary | ICD-10-CM | POA: Diagnosis present

## 2016-10-29 DIAGNOSIS — E785 Hyperlipidemia, unspecified: Secondary | ICD-10-CM | POA: Diagnosis present

## 2016-10-29 DIAGNOSIS — G9341 Metabolic encephalopathy: Secondary | ICD-10-CM | POA: Diagnosis present

## 2016-10-29 DIAGNOSIS — R0602 Shortness of breath: Secondary | ICD-10-CM | POA: Diagnosis not present

## 2016-10-29 DIAGNOSIS — I4891 Unspecified atrial fibrillation: Secondary | ICD-10-CM | POA: Diagnosis present

## 2016-10-29 DIAGNOSIS — J90 Pleural effusion, not elsewhere classified: Secondary | ICD-10-CM | POA: Diagnosis not present

## 2016-10-29 DIAGNOSIS — Z978 Presence of other specified devices: Secondary | ICD-10-CM

## 2016-10-29 DIAGNOSIS — E870 Hyperosmolality and hypernatremia: Secondary | ICD-10-CM | POA: Diagnosis not present

## 2016-10-29 DIAGNOSIS — J939 Pneumothorax, unspecified: Secondary | ICD-10-CM | POA: Diagnosis not present

## 2016-10-29 DIAGNOSIS — D649 Anemia, unspecified: Secondary | ICD-10-CM | POA: Diagnosis present

## 2016-10-29 DIAGNOSIS — C3490 Malignant neoplasm of unspecified part of unspecified bronchus or lung: Secondary | ICD-10-CM | POA: Diagnosis not present

## 2016-10-29 DIAGNOSIS — R5381 Other malaise: Secondary | ICD-10-CM | POA: Diagnosis not present

## 2016-10-29 DIAGNOSIS — Z961 Presence of intraocular lens: Secondary | ICD-10-CM | POA: Diagnosis present

## 2016-10-29 DIAGNOSIS — E43 Unspecified severe protein-calorie malnutrition: Secondary | ICD-10-CM | POA: Diagnosis present

## 2016-10-29 DIAGNOSIS — D62 Acute posthemorrhagic anemia: Secondary | ICD-10-CM | POA: Diagnosis not present

## 2016-10-29 DIAGNOSIS — Z01818 Encounter for other preprocedural examination: Secondary | ICD-10-CM

## 2016-10-29 DIAGNOSIS — R1310 Dysphagia, unspecified: Secondary | ICD-10-CM | POA: Diagnosis not present

## 2016-10-29 DIAGNOSIS — I82403 Acute embolism and thrombosis of unspecified deep veins of lower extremity, bilateral: Secondary | ICD-10-CM | POA: Diagnosis not present

## 2016-10-29 DIAGNOSIS — Z87891 Personal history of nicotine dependence: Secondary | ICD-10-CM | POA: Diagnosis not present

## 2016-10-29 DIAGNOSIS — M6281 Muscle weakness (generalized): Secondary | ICD-10-CM

## 2016-10-29 DIAGNOSIS — J9819 Other pulmonary collapse: Secondary | ICD-10-CM

## 2016-10-29 DIAGNOSIS — E871 Hypo-osmolality and hyponatremia: Secondary | ICD-10-CM | POA: Diagnosis not present

## 2016-10-29 DIAGNOSIS — Z91048 Other nonmedicinal substance allergy status: Secondary | ICD-10-CM | POA: Diagnosis not present

## 2016-10-29 DIAGNOSIS — R471 Dysarthria and anarthria: Secondary | ICD-10-CM | POA: Diagnosis present

## 2016-10-29 DIAGNOSIS — J60 Coalworker's pneumoconiosis: Secondary | ICD-10-CM | POA: Diagnosis not present

## 2016-10-29 DIAGNOSIS — C3400 Malignant neoplasm of unspecified main bronchus: Secondary | ICD-10-CM | POA: Diagnosis not present

## 2016-10-29 DIAGNOSIS — J449 Chronic obstructive pulmonary disease, unspecified: Secondary | ICD-10-CM | POA: Diagnosis not present

## 2016-10-29 DIAGNOSIS — J982 Interstitial emphysema: Secondary | ICD-10-CM | POA: Diagnosis not present

## 2016-10-29 DIAGNOSIS — R0600 Dyspnea, unspecified: Secondary | ICD-10-CM | POA: Diagnosis not present

## 2016-10-29 DIAGNOSIS — R579 Shock, unspecified: Secondary | ICD-10-CM | POA: Diagnosis not present

## 2016-10-29 DIAGNOSIS — R112 Nausea with vomiting, unspecified: Secondary | ICD-10-CM | POA: Diagnosis not present

## 2016-10-29 DIAGNOSIS — J9382 Other air leak: Secondary | ICD-10-CM | POA: Diagnosis not present

## 2016-10-29 DIAGNOSIS — C3491 Malignant neoplasm of unspecified part of right bronchus or lung: Secondary | ICD-10-CM | POA: Diagnosis not present

## 2016-10-29 DIAGNOSIS — J948 Other specified pleural conditions: Secondary | ICD-10-CM | POA: Diagnosis not present

## 2016-10-29 DIAGNOSIS — C3411 Malignant neoplasm of upper lobe, right bronchus or lung: Secondary | ICD-10-CM | POA: Diagnosis present

## 2016-10-29 DIAGNOSIS — J441 Chronic obstructive pulmonary disease with (acute) exacerbation: Secondary | ICD-10-CM | POA: Diagnosis present

## 2016-10-29 DIAGNOSIS — Y95 Nosocomial condition: Secondary | ICD-10-CM | POA: Diagnosis not present

## 2016-10-29 DIAGNOSIS — R410 Disorientation, unspecified: Secondary | ICD-10-CM | POA: Diagnosis not present

## 2016-10-29 DIAGNOSIS — Z4682 Encounter for fitting and adjustment of non-vascular catheter: Secondary | ICD-10-CM | POA: Diagnosis not present

## 2016-10-29 DIAGNOSIS — D696 Thrombocytopenia, unspecified: Secondary | ICD-10-CM | POA: Diagnosis not present

## 2016-10-29 DIAGNOSIS — J189 Pneumonia, unspecified organism: Secondary | ICD-10-CM | POA: Diagnosis not present

## 2016-10-29 DIAGNOSIS — R6521 Severe sepsis with septic shock: Secondary | ICD-10-CM | POA: Diagnosis not present

## 2016-10-29 DIAGNOSIS — Z9849 Cataract extraction status, unspecified eye: Secondary | ICD-10-CM

## 2016-10-29 DIAGNOSIS — E874 Mixed disorder of acid-base balance: Secondary | ICD-10-CM | POA: Diagnosis present

## 2016-10-29 DIAGNOSIS — J9601 Acute respiratory failure with hypoxia: Secondary | ICD-10-CM | POA: Diagnosis not present

## 2016-10-29 DIAGNOSIS — I10 Essential (primary) hypertension: Secondary | ICD-10-CM | POA: Diagnosis present

## 2016-10-29 DIAGNOSIS — Z79899 Other long term (current) drug therapy: Secondary | ICD-10-CM

## 2016-10-29 DIAGNOSIS — A419 Sepsis, unspecified organism: Secondary | ICD-10-CM | POA: Diagnosis not present

## 2016-10-29 DIAGNOSIS — R918 Other nonspecific abnormal finding of lung field: Secondary | ICD-10-CM | POA: Diagnosis not present

## 2016-10-29 DIAGNOSIS — J9690 Respiratory failure, unspecified, unspecified whether with hypoxia or hypercapnia: Secondary | ICD-10-CM

## 2016-10-29 DIAGNOSIS — N179 Acute kidney failure, unspecified: Secondary | ICD-10-CM | POA: Diagnosis not present

## 2016-10-29 DIAGNOSIS — J9600 Acute respiratory failure, unspecified whether with hypoxia or hypercapnia: Secondary | ICD-10-CM

## 2016-10-29 DIAGNOSIS — I480 Paroxysmal atrial fibrillation: Secondary | ICD-10-CM

## 2016-10-29 DIAGNOSIS — Z902 Acquired absence of lung [part of]: Secondary | ICD-10-CM | POA: Diagnosis not present

## 2016-10-29 DIAGNOSIS — R000 Tachycardia, unspecified: Secondary | ICD-10-CM | POA: Diagnosis not present

## 2016-10-29 DIAGNOSIS — I82409 Acute embolism and thrombosis of unspecified deep veins of unspecified lower extremity: Secondary | ICD-10-CM

## 2016-10-29 DIAGNOSIS — K5900 Constipation, unspecified: Secondary | ICD-10-CM | POA: Diagnosis present

## 2016-10-29 DIAGNOSIS — G6281 Critical illness polyneuropathy: Secondary | ICD-10-CM | POA: Diagnosis not present

## 2016-10-29 DIAGNOSIS — J95812 Postprocedural air leak: Secondary | ICD-10-CM | POA: Diagnosis not present

## 2016-10-29 DIAGNOSIS — G2581 Restless legs syndrome: Secondary | ICD-10-CM | POA: Diagnosis present

## 2016-10-29 DIAGNOSIS — Z09 Encounter for follow-up examination after completed treatment for conditions other than malignant neoplasm: Secondary | ICD-10-CM

## 2016-10-29 DIAGNOSIS — Z9689 Presence of other specified functional implants: Secondary | ICD-10-CM

## 2016-10-29 DIAGNOSIS — Z952 Presence of prosthetic heart valve: Secondary | ICD-10-CM | POA: Diagnosis not present

## 2016-10-29 DIAGNOSIS — J9811 Atelectasis: Secondary | ICD-10-CM | POA: Diagnosis not present

## 2016-10-29 DIAGNOSIS — D638 Anemia in other chronic diseases classified elsewhere: Secondary | ICD-10-CM | POA: Diagnosis not present

## 2016-10-29 DIAGNOSIS — I959 Hypotension, unspecified: Secondary | ICD-10-CM

## 2016-10-29 DIAGNOSIS — Z4659 Encounter for fitting and adjustment of other gastrointestinal appliance and device: Secondary | ICD-10-CM

## 2016-10-29 DIAGNOSIS — Z452 Encounter for adjustment and management of vascular access device: Secondary | ICD-10-CM | POA: Diagnosis not present

## 2016-10-29 DIAGNOSIS — M1990 Unspecified osteoarthritis, unspecified site: Secondary | ICD-10-CM | POA: Diagnosis present

## 2016-10-29 DIAGNOSIS — G7281 Critical illness myopathy: Secondary | ICD-10-CM | POA: Diagnosis not present

## 2016-10-29 DIAGNOSIS — E876 Hypokalemia: Secondary | ICD-10-CM | POA: Diagnosis not present

## 2016-10-29 DIAGNOSIS — F419 Anxiety disorder, unspecified: Secondary | ICD-10-CM | POA: Diagnosis present

## 2016-10-29 DIAGNOSIS — Z6823 Body mass index (BMI) 23.0-23.9, adult: Secondary | ICD-10-CM

## 2016-10-29 DIAGNOSIS — Z90710 Acquired absence of both cervix and uterus: Secondary | ICD-10-CM | POA: Diagnosis not present

## 2016-10-29 DIAGNOSIS — K5901 Slow transit constipation: Secondary | ICD-10-CM | POA: Diagnosis not present

## 2016-10-29 DIAGNOSIS — M1610 Unilateral primary osteoarthritis, unspecified hip: Secondary | ICD-10-CM | POA: Diagnosis not present

## 2016-10-29 DIAGNOSIS — K2970 Gastritis, unspecified, without bleeding: Secondary | ICD-10-CM | POA: Diagnosis not present

## 2016-10-29 DIAGNOSIS — I82493 Acute embolism and thrombosis of other specified deep vein of lower extremity, bilateral: Secondary | ICD-10-CM | POA: Diagnosis not present

## 2016-10-29 DIAGNOSIS — R739 Hyperglycemia, unspecified: Secondary | ICD-10-CM | POA: Diagnosis present

## 2016-10-29 DIAGNOSIS — T17990A Other foreign object in respiratory tract, part unspecified in causing asphyxiation, initial encounter: Secondary | ICD-10-CM | POA: Diagnosis not present

## 2016-10-29 HISTORY — PX: VIDEO BRONCHOSCOPY: SHX5072

## 2016-10-29 HISTORY — PX: VIDEO ASSISTED THORACOSCOPY (VATS)/WEDGE RESECTION: SHX6174

## 2016-10-29 HISTORY — PX: LYMPH NODE DISSECTION: SHX5087

## 2016-10-29 LAB — POCT I-STAT 3, ART BLOOD GAS (G3+)
Acid-Base Excess: 3 mmol/L — ABNORMAL HIGH (ref 0.0–2.0)
Bicarbonate: 26.8 mmol/L (ref 20.0–28.0)
O2 Saturation: 98 %
Patient temperature: 98
TCO2: 28 mmol/L (ref 0–100)
pCO2 arterial: 36.7 mmHg (ref 32.0–48.0)
pH, Arterial: 7.469 — ABNORMAL HIGH (ref 7.350–7.450)
pO2, Arterial: 92 mmHg (ref 83.0–108.0)

## 2016-10-29 LAB — POCT I-STAT 4, (NA,K, GLUC, HGB,HCT)
Glucose, Bld: 216 mg/dL — ABNORMAL HIGH (ref 65–99)
HCT: 36 % (ref 36.0–46.0)
Hemoglobin: 12.2 g/dL (ref 12.0–15.0)
Potassium: 3.5 mmol/L (ref 3.5–5.1)
Sodium: 136 mmol/L (ref 135–145)

## 2016-10-29 LAB — GLUCOSE, CAPILLARY
Glucose-Capillary: 143 mg/dL — ABNORMAL HIGH (ref 65–99)
Glucose-Capillary: 115 mg/dL — ABNORMAL HIGH (ref 65–99)

## 2016-10-29 SURGERY — BRONCHOSCOPY, VIDEO-ASSISTED
Anesthesia: General | Site: Chest | Laterality: Right

## 2016-10-29 MED ORDER — TIOTROPIUM BROMIDE MONOHYDRATE 18 MCG IN CAPS
18.0000 ug | ORAL_CAPSULE | RESPIRATORY_TRACT | Status: DC
  Filled 2016-10-29: qty 5

## 2016-10-29 MED ORDER — SIMVASTATIN 20 MG PO TABS
20.0000 mg | ORAL_TABLET | Freq: Every day | ORAL | Status: DC
Start: 2016-10-29 — End: 2016-11-08
  Administered 2016-10-29 – 2016-11-06 (×9): 20 mg via ORAL
  Filled 2016-10-29 (×9): qty 1

## 2016-10-29 MED ORDER — NON FORMULARY
15.0000 ug | Freq: Every day | Status: DC
Start: 2016-10-29 — End: 2016-10-29

## 2016-10-29 MED ORDER — DEXAMETHASONE SODIUM PHOSPHATE 10 MG/ML IJ SOLN
INTRAMUSCULAR | Status: AC
  Filled 2016-10-29: qty 1

## 2016-10-29 MED ORDER — BUPIVACAINE HCL (PF) 0.5 % IJ SOLN
INTRAMUSCULAR | Status: AC
  Filled 2016-10-29: qty 10

## 2016-10-29 MED ORDER — ROCURONIUM BROMIDE 50 MG/5ML IV SOSY
PREFILLED_SYRINGE | INTRAVENOUS | Status: AC
Start: 2016-10-29 — End: 2016-10-29
  Filled 2016-10-29: qty 10

## 2016-10-29 MED ORDER — DEXAMETHASONE SODIUM PHOSPHATE 10 MG/ML IJ SOLN
INTRAMUSCULAR | Status: DC | PRN
  Administered 2016-10-29: 10 mg via INTRAVENOUS

## 2016-10-29 MED ORDER — SUGAMMADEX SODIUM 200 MG/2ML IV SOLN
INTRAVENOUS | Status: AC
  Filled 2016-10-29: qty 2

## 2016-10-29 MED ORDER — ORAL CARE MOUTH RINSE
15.0000 mL | Freq: Two times a day (BID) | OROMUCOSAL | Status: DC
  Administered 2016-10-29 – 2016-11-10 (×10): 15 mL via OROMUCOSAL

## 2016-10-29 MED ORDER — BISACODYL 5 MG PO TBEC
10.0000 mg | DELAYED_RELEASE_TABLET | Freq: Every day | ORAL | Status: DC
  Administered 2016-10-29 – 2016-11-04 (×5): 10 mg via ORAL
  Filled 2016-10-29 (×6): qty 2

## 2016-10-29 MED ORDER — PHENYLEPHRINE 40 MCG/ML (10ML) SYRINGE FOR IV PUSH (FOR BLOOD PRESSURE SUPPORT)
PREFILLED_SYRINGE | INTRAVENOUS | Status: AC
  Filled 2016-10-29: qty 10

## 2016-10-29 MED ORDER — ARFORMOTEROL TARTRATE 15 MCG/2ML IN NEBU
15.0000 ug | INHALATION_SOLUTION | Freq: Two times a day (BID) | RESPIRATORY_TRACT | Status: DC
  Administered 2016-10-29 – 2016-11-06 (×16): 15 ug via RESPIRATORY_TRACT
  Filled 2016-10-29 (×17): qty 2

## 2016-10-29 MED ORDER — NALOXONE HCL 0.4 MG/ML IJ SOLN: 0.4000 mg | INTRAMUSCULAR | Status: DC | PRN

## 2016-10-29 MED ORDER — ALBUTEROL SULFATE (2.5 MG/3ML) 0.083% IN NEBU
3.0000 mL | INHALATION_SOLUTION | Freq: Four times a day (QID) | RESPIRATORY_TRACT | Status: DC | PRN
  Filled 2016-10-29: qty 3

## 2016-10-29 MED ORDER — BUPIVACAINE 0.5 % ON-Q PUMP SINGLE CATH 400 ML
INJECTION | Status: DC | PRN
  Administered 2016-10-29: 400 mL

## 2016-10-29 MED ORDER — MIDAZOLAM HCL 2 MG/2ML IJ SOLN
INTRAMUSCULAR | Status: AC
  Filled 2016-10-29: qty 2

## 2016-10-29 MED ORDER — SODIUM CHLORIDE 0.9% FLUSH
10.0000 mL | Freq: Two times a day (BID) | INTRAVENOUS | Status: DC
  Administered 2016-10-29 – 2016-11-06 (×12): 10 mL
  Administered 2016-11-06: 20 mL
  Administered 2016-11-07 – 2016-12-03 (×37): 10 mL
  Administered 2016-12-03: 20 mL
  Administered 2016-12-04: 10 mL

## 2016-10-29 MED ORDER — PROPOFOL 10 MG/ML IV BOLUS
INTRAVENOUS | Status: DC | PRN
  Administered 2016-10-29: 80 mg via INTRAVENOUS

## 2016-10-29 MED ORDER — LACTATED RINGERS IV SOLN
INTRAVENOUS | Status: DC | PRN
  Administered 2016-10-29: 07:00:00 via INTRAVENOUS

## 2016-10-29 MED ORDER — SODIUM CHLORIDE 0.9 % IV SOLN
INTRAVENOUS | Status: DC
  Administered 2016-10-29 – 2016-11-27 (×3): via INTRAVENOUS
  Administered 2016-11-28: 10 mL via INTRAVENOUS

## 2016-10-29 MED ORDER — FENTANYL 40 MCG/ML IV SOLN
INTRAVENOUS | Status: DC
  Administered 2016-10-29: 10 ug via INTRAVENOUS
  Administered 2016-10-29 – 2016-10-30 (×4): 30 ug via INTRAVENOUS
  Administered 2016-10-30: 10 ug via INTRAVENOUS
  Administered 2016-10-30: 70 ug via INTRAVENOUS
  Administered 2016-10-31: 20 ug via INTRAVENOUS
  Administered 2016-10-31: 30 ug via INTRAVENOUS
  Administered 2016-10-31: 10 ug via INTRAVENOUS
  Administered 2016-10-31 (×2): 30 ug via INTRAVENOUS
  Administered 2016-11-01: 40 ug via INTRAVENOUS
  Administered 2016-11-01: 50 ug via INTRAVENOUS
  Administered 2016-11-01 (×2): 10 ug via INTRAVENOUS
  Administered 2016-11-02: 90 ug via INTRAVENOUS
  Administered 2016-11-02: 20 ug via INTRAVENOUS
  Administered 2016-11-02: 40 ug via INTRAVENOUS
  Administered 2016-11-02: 10 ug via INTRAVENOUS
  Administered 2016-11-02: 0 ug via INTRAVENOUS
  Administered 2016-11-02: 10 ug via INTRAVENOUS
  Administered 2016-11-03: 20 ug via INTRAVENOUS
  Administered 2016-11-03 (×2): 40 ug via INTRAVENOUS

## 2016-10-29 MED ORDER — LIDOCAINE 2% (20 MG/ML) 5 ML SYRINGE
INTRAMUSCULAR | Status: AC
  Filled 2016-10-29: qty 5

## 2016-10-29 MED ORDER — DEXTROSE-NACL 5-0.45 % IV SOLN
INTRAVENOUS | Status: DC
  Administered 2016-10-29: 100 mL/h via INTRAVENOUS

## 2016-10-29 MED ORDER — PROPOFOL 10 MG/ML IV BOLUS
INTRAVENOUS | Status: AC
  Filled 2016-10-29: qty 20

## 2016-10-29 MED ORDER — ROCURONIUM BROMIDE 50 MG/5ML IV SOSY
PREFILLED_SYRINGE | INTRAVENOUS | Status: AC
  Filled 2016-10-29: qty 5

## 2016-10-29 MED ORDER — HYDROMORPHONE HCL 1 MG/ML IJ SOLN
INTRAMUSCULAR | Status: AC
  Filled 2016-10-29: qty 0.5

## 2016-10-29 MED ORDER — CEFUROXIME SODIUM 1.5 G IJ SOLR
1.5000 g | INTRAMUSCULAR | Status: AC
  Administered 2016-10-29: 1.5 g via INTRAVENOUS
  Filled 2016-10-29: qty 1.5

## 2016-10-29 MED ORDER — HYDROMORPHONE HCL 1 MG/ML IJ SOLN
0.2500 mg | INTRAMUSCULAR | Status: DC | PRN
  Administered 2016-10-29 (×2): 0.25 mg via INTRAVENOUS

## 2016-10-29 MED ORDER — 0.9 % SODIUM CHLORIDE (POUR BTL) OPTIME
TOPICAL | Status: DC | PRN
  Administered 2016-10-29: 3000 mL

## 2016-10-29 MED ORDER — SODIUM CHLORIDE 0.9% FLUSH: 9.0000 mL | INTRAVENOUS | Status: DC | PRN

## 2016-10-29 MED ORDER — SUGAMMADEX SODIUM 200 MG/2ML IV SOLN
INTRAVENOUS | Status: DC | PRN
  Administered 2016-10-29: 200 mg via INTRAVENOUS

## 2016-10-29 MED ORDER — FENTANYL CITRATE (PF) 100 MCG/2ML IJ SOLN
INTRAMUSCULAR | Status: AC
  Filled 2016-10-29: qty 4

## 2016-10-29 MED ORDER — ONDANSETRON HCL 4 MG/2ML IJ SOLN
INTRAMUSCULAR | Status: DC | PRN
  Administered 2016-10-29: 4 mg via INTRAVENOUS

## 2016-10-29 MED ORDER — TRAMADOL HCL 50 MG PO TABS
50.0000 mg | ORAL_TABLET | Freq: Four times a day (QID) | ORAL | Status: DC | PRN
  Administered 2016-10-29: 100 mg via ORAL
  Administered 2016-11-05: 50 mg via ORAL
  Administered 2016-11-05: 100 mg via ORAL
  Administered 2016-11-06 (×2): 50 mg via ORAL
  Filled 2016-10-29 (×2): qty 1
  Filled 2016-10-29 (×2): qty 2
  Filled 2016-10-29: qty 1

## 2016-10-29 MED ORDER — LEVALBUTEROL HCL 0.63 MG/3ML IN NEBU
0.6300 mg | INHALATION_SOLUTION | Freq: Four times a day (QID) | RESPIRATORY_TRACT | Status: DC
  Administered 2016-10-29 – 2016-11-01 (×13): 0.63 mg via RESPIRATORY_TRACT
  Filled 2016-10-29 (×13): qty 3

## 2016-10-29 MED ORDER — BUPIVACAINE 0.5 % ON-Q PUMP SINGLE CATH 400 ML
400.0000 mL | INJECTION | Status: DC
  Filled 2016-10-29: qty 400

## 2016-10-29 MED ORDER — DIPHENHYDRAMINE HCL 12.5 MG/5ML PO ELIX: 12.5000 mg | ORAL_SOLUTION | Freq: Four times a day (QID) | ORAL | Status: DC | PRN

## 2016-10-29 MED ORDER — SODIUM CHLORIDE 0.9 % IV SOLN: 30.0000 meq | Freq: Once | INTRAVENOUS | Status: DC

## 2016-10-29 MED ORDER — DEXTROSE 5 % IV SOLN
30.0000 meq | Freq: Once | INTRAVENOUS | Status: DC
  Filled 2016-10-29: qty 6.82

## 2016-10-29 MED ORDER — ACETAMINOPHEN 160 MG/5ML PO SOLN: 1000.0000 mg | Freq: Four times a day (QID) | ORAL | Status: AC

## 2016-10-29 MED ORDER — FENTANYL CITRATE (PF) 100 MCG/2ML IJ SOLN
INTRAMUSCULAR | Status: DC | PRN
  Administered 2016-10-29 (×2): 50 ug via INTRAVENOUS
  Administered 2016-10-29: 100 ug via INTRAVENOUS
  Administered 2016-10-29 (×2): 50 ug via INTRAVENOUS

## 2016-10-29 MED ORDER — DEXTROSE 5 % IV SOLN
INTRAVENOUS | Status: DC | PRN
  Administered 2016-10-29: 25 ug/min via INTRAVENOUS

## 2016-10-29 MED ORDER — INSULIN ASPART 100 UNIT/ML ~~LOC~~ SOLN
0.0000 [IU] | SUBCUTANEOUS | Status: DC
  Administered 2016-10-29: 2 [IU] via SUBCUTANEOUS
  Administered 2016-10-29: 8 [IU] via SUBCUTANEOUS
  Administered 2016-10-30: 2 [IU] via SUBCUTANEOUS
  Administered 2016-10-30 (×2): 4 [IU] via SUBCUTANEOUS
  Administered 2016-10-30: 2 [IU] via SUBCUTANEOUS
  Administered 2016-10-31 (×2): 4 [IU] via SUBCUTANEOUS
  Administered 2016-10-31 – 2016-11-03 (×6): 2 [IU] via SUBCUTANEOUS

## 2016-10-29 MED ORDER — DEXTROSE 5 % IV SOLN
1.5000 g | Freq: Two times a day (BID) | INTRAVENOUS | Status: AC
  Administered 2016-10-29 – 2016-10-30 (×2): 1.5 g via INTRAVENOUS
  Filled 2016-10-29 (×2): qty 1.5

## 2016-10-29 MED ORDER — SODIUM CHLORIDE 0.9% FLUSH
10.0000 mL | INTRAVENOUS | Status: DC | PRN
  Administered 2016-11-02: 10 mL
  Administered 2016-11-24: 20 mL
  Filled 2016-10-29 (×2): qty 40

## 2016-10-29 MED ORDER — ONDANSETRON HCL 4 MG/2ML IJ SOLN
INTRAMUSCULAR | Status: AC
Start: 2016-10-29 — End: 2016-10-29
  Filled 2016-10-29: qty 2

## 2016-10-29 MED ORDER — OXYCODONE HCL 5 MG PO TABS
5.0000 mg | ORAL_TABLET | ORAL | Status: DC | PRN
  Administered 2016-11-02 – 2016-11-03 (×2): 5 mg via ORAL
  Administered 2016-11-03 – 2016-11-04 (×3): 10 mg via ORAL
  Administered 2016-11-04: 5 mg via ORAL
  Administered 2016-11-04: 10 mg via ORAL
  Administered 2016-11-04 – 2016-11-06 (×4): 5 mg via ORAL
  Administered 2016-11-07: 10 mg via ORAL
  Filled 2016-10-29 (×2): qty 1
  Filled 2016-10-29 (×3): qty 2
  Filled 2016-10-29 (×2): qty 1
  Filled 2016-10-29 (×2): qty 2
  Filled 2016-10-29 (×3): qty 1

## 2016-10-29 MED ORDER — ACETAMINOPHEN 500 MG PO TABS
1000.0000 mg | ORAL_TABLET | Freq: Four times a day (QID) | ORAL | Status: AC
  Administered 2016-10-29 – 2016-11-03 (×13): 1000 mg via ORAL
  Filled 2016-10-29 (×14): qty 2

## 2016-10-29 MED ORDER — DIPHENHYDRAMINE HCL 50 MG/ML IJ SOLN: 12.5000 mg | Freq: Four times a day (QID) | INTRAMUSCULAR | Status: DC | PRN

## 2016-10-29 MED ORDER — FENTANYL CITRATE (PF) 100 MCG/2ML IJ SOLN
INTRAMUSCULAR | Status: AC
  Filled 2016-10-29: qty 2

## 2016-10-29 MED ORDER — LIDOCAINE HCL (CARDIAC) 20 MG/ML IV SOLN
INTRAVENOUS | Status: DC | PRN
  Administered 2016-10-29: 100 mg via INTRAVENOUS

## 2016-10-29 MED ORDER — SODIUM CHLORIDE 0.9 % IV SOLN
30.0000 meq | Freq: Every day | INTRAVENOUS | Status: DC | PRN
  Administered 2016-10-29 – 2016-11-28 (×6): 30 meq via INTRAVENOUS
  Filled 2016-10-29 (×11): qty 15

## 2016-10-29 MED ORDER — SENNOSIDES-DOCUSATE SODIUM 8.6-50 MG PO TABS
1.0000 | ORAL_TABLET | Freq: Every day | ORAL | Status: DC
  Administered 2016-10-29 – 2016-11-06 (×6): 1 via ORAL
  Filled 2016-10-29 (×6): qty 1

## 2016-10-29 MED ORDER — ROCURONIUM BROMIDE 100 MG/10ML IV SOLN
INTRAVENOUS | Status: DC | PRN
  Administered 2016-10-29: 30 mg via INTRAVENOUS
  Administered 2016-10-29: 10 mg via INTRAVENOUS
  Administered 2016-10-29: 50 mg via INTRAVENOUS

## 2016-10-29 MED ORDER — FENTANYL 40 MCG/ML IV SOLN
INTRAVENOUS | Status: AC
  Filled 2016-10-29: qty 25

## 2016-10-29 MED ORDER — PHENYLEPHRINE HCL 10 MG/ML IJ SOLN
INTRAMUSCULAR | Status: DC | PRN
  Administered 2016-10-29: 40 ug via INTRAVENOUS
  Administered 2016-10-29: 80 ug via INTRAVENOUS

## 2016-10-29 MED ORDER — ONDANSETRON HCL 4 MG/2ML IJ SOLN: 4.0000 mg | Freq: Four times a day (QID) | INTRAMUSCULAR | Status: DC | PRN

## 2016-10-29 MED ORDER — BUPIVACAINE HCL (PF) 0.5 % IJ SOLN
INTRAMUSCULAR | Status: DC | PRN
  Administered 2016-10-29: 10 mL

## 2016-10-29 MED ORDER — TIOTROPIUM BROMIDE MONOHYDRATE 18 MCG IN CAPS
18.0000 ug | ORAL_CAPSULE | Freq: Every day | RESPIRATORY_TRACT | Status: DC
  Administered 2016-10-31 – 2016-11-06 (×5): 18 ug via RESPIRATORY_TRACT
  Filled 2016-10-29: qty 5

## 2016-10-29 SURGICAL SUPPLY — 101 items
ADH SKN CLS APL DERMABOND .7 (GAUZE/BANDAGES/DRESSINGS)
APL SRG 22X2 LUM MLBL SLNT (VASCULAR PRODUCTS) ×2
APL SRG 7X2 LUM MLBL SLNT (VASCULAR PRODUCTS)
APPLICATOR TIP COSEAL (VASCULAR PRODUCTS) IMPLANT
APPLICATOR TIP EXT COSEAL (VASCULAR PRODUCTS) ×1 IMPLANT
BLADE SURG 11 STRL SS (BLADE) IMPLANT
BRUSH CYTOL CELLEBRITY 1.5X140 (MISCELLANEOUS) IMPLANT
CANISTER SUCTION 2500CC (MISCELLANEOUS) ×3 IMPLANT
CATH KIT ON Q 5IN SLV (PAIN MANAGEMENT) ×1 IMPLANT
CATH THORACIC 28FR (CATHETERS) ×1 IMPLANT
CATH THORACIC 36FR (CATHETERS) IMPLANT
CATH THORACIC 36FR RT ANG (CATHETERS) IMPLANT
CLIP TI MEDIUM 6 (CLIP) ×3 IMPLANT
CONN ST 1/4X3/8  BEN (MISCELLANEOUS)
CONN ST 1/4X3/8 BEN (MISCELLANEOUS) IMPLANT
CONT SPEC 4OZ CLIKSEAL STRL BL (MISCELLANEOUS) ×19 IMPLANT
COVER TABLE BACK 60X90 (DRAPES) ×3 IMPLANT
DERMABOND ADVANCED (GAUZE/BANDAGES/DRESSINGS)
DERMABOND ADVANCED .7 DNX12 (GAUZE/BANDAGES/DRESSINGS) IMPLANT
DRAIN CHANNEL 28F RND 3/8 FF (WOUND CARE) ×1 IMPLANT
DRAIN CHANNEL 32F RND 10.7 FF (WOUND CARE) IMPLANT
DRAPE HALF SHEET 40X57 (DRAPES) ×1 IMPLANT
DRAPE LAPAROSCOPIC ABDOMINAL (DRAPES) ×3 IMPLANT
DRAPE WARM FLUID 44X44 (DRAPE) ×3 IMPLANT
DRILL BIT 7/64X5 (BIT) ×1 IMPLANT
ELECT BLADE 4.0 EZ CLEAN MEGAD (MISCELLANEOUS) ×3
ELECT BLADE 6.5 EXT (BLADE) ×2 IMPLANT
ELECT REM PT RETURN 9FT ADLT (ELECTROSURGICAL) ×3
ELECTRODE BLDE 4.0 EZ CLN MEGD (MISCELLANEOUS) ×2 IMPLANT
ELECTRODE REM PT RTRN 9FT ADLT (ELECTROSURGICAL) ×2 IMPLANT
FORCEPS BIOP RJ4 1.8 (CUTTING FORCEPS) IMPLANT
GAUZE SPONGE 4X4 12PLY STRL (GAUZE/BANDAGES/DRESSINGS) ×3 IMPLANT
GLOVE BIO SURGEON STRL SZ 6.5 (GLOVE) ×7 IMPLANT
GLOVE BIOGEL PI IND STRL 6.5 (GLOVE) IMPLANT
GLOVE BIOGEL PI INDICATOR 6.5 (GLOVE) ×1
GLOVE SURG SS PI 6.0 STRL IVOR (GLOVE) ×2 IMPLANT
GOWN STRL REUS W/ TWL LRG LVL3 (GOWN DISPOSABLE) ×9 IMPLANT
GOWN STRL REUS W/TWL LRG LVL3 (GOWN DISPOSABLE) ×15
KIT BASIN OR (CUSTOM PROCEDURE TRAY) ×3 IMPLANT
KIT CLEAN ENDO COMPLIANCE (KITS) ×2 IMPLANT
KIT ROOM TURNOVER OR (KITS) ×3 IMPLANT
KIT SUCTION CATH 14FR (SUCTIONS) ×3 IMPLANT
MARKER SKIN DUAL TIP RULER LAB (MISCELLANEOUS) IMPLANT
NDL BIOPSY TRANSBRONCH 21G (NEEDLE) IMPLANT
NEEDLE BIOPSY TRANSBRONCH 21G (NEEDLE) IMPLANT
NS IRRIG 1000ML POUR BTL (IV SOLUTION) ×12 IMPLANT
OIL SILICONE PENTAX (PARTS (SERVICE/REPAIRS)) ×3 IMPLANT
PACK CHEST (CUSTOM PROCEDURE TRAY) ×3 IMPLANT
PAD ARMBOARD 7.5X6 YLW CONV (MISCELLANEOUS) ×9 IMPLANT
PASSER SUT SWANSON 36MM LOOP (INSTRUMENTS) ×2 IMPLANT
POUCH ENDO CATCH II 15MM (MISCELLANEOUS) ×1 IMPLANT
RELOAD GREEN ECHELON 45 (STAPLE) ×1 IMPLANT
RELOAD STAPLE 35X2.5 WHT THIN (STAPLE) IMPLANT
RELOAD STAPLE 45 GOLD REG/THCK (STAPLE) IMPLANT
SCISSORS LAP 5X35 DISP (ENDOMECHANICALS) IMPLANT
SEALANT SURG COSEAL 4ML (VASCULAR PRODUCTS) ×1 IMPLANT
SEALANT SURG COSEAL 8ML (VASCULAR PRODUCTS) ×1 IMPLANT
SOLUTION ANTI FOG 6CC (MISCELLANEOUS) ×5 IMPLANT
SPONGE GAUZE 4X4 12PLY STER LF (GAUZE/BANDAGES/DRESSINGS) ×1 IMPLANT
STAPLE RELOAD 2.5MM WHITE (STAPLE) ×9 IMPLANT
STAPLE RELOAD 45MM GOLD (STAPLE) ×24 IMPLANT
STAPLER ECHELON POWERED (MISCELLANEOUS) ×1 IMPLANT
STAPLER VASCULAR ECHELON 35 (CUTTER) ×1 IMPLANT
STOPCOCK 4 WAY LG BORE MALE ST (IV SETS) ×2 IMPLANT
SUT PROLENE 3 0 SH DA (SUTURE) IMPLANT
SUT PROLENE 4 0 RB 1 (SUTURE)
SUT PROLENE 4-0 RB1 .5 CRCL 36 (SUTURE) IMPLANT
SUT SILK  1 MH (SUTURE) ×4
SUT SILK 1 MH (SUTURE) ×8 IMPLANT
SUT SILK 1 TIES 10X30 (SUTURE) IMPLANT
SUT SILK 2 0 SH (SUTURE) IMPLANT
SUT SILK 2 0SH CR/8 30 (SUTURE) IMPLANT
SUT SILK 3 0 SH 30 (SUTURE) ×1 IMPLANT
SUT SILK 3 0SH CR/8 30 (SUTURE) IMPLANT
SUT STEEL 1 (SUTURE) IMPLANT
SUT VIC AB 0 CTX 18 (SUTURE) ×3 IMPLANT
SUT VIC AB 1 CTX 18 (SUTURE) IMPLANT
SUT VIC AB 1 CTX 36 (SUTURE) ×3
SUT VIC AB 1 CTX36XBRD ANBCTR (SUTURE) IMPLANT
SUT VIC AB 2-0 CTX 36 (SUTURE) ×1 IMPLANT
SUT VIC AB 2-0 UR6 27 (SUTURE) IMPLANT
SUT VIC AB 3-0 SH 27 (SUTURE) ×9
SUT VIC AB 3-0 SH 27X BRD (SUTURE) IMPLANT
SUT VIC AB 3-0 SH 8-18 (SUTURE) ×1 IMPLANT
SUT VIC AB 3-0 X1 27 (SUTURE) ×1 IMPLANT
SUT VICRYL 0 UR6 27IN ABS (SUTURE) IMPLANT
SUT VICRYL 2 TP 1 (SUTURE) ×2 IMPLANT
SWAB COLLECTION DEVICE MRSA (MISCELLANEOUS) IMPLANT
SYR 20ML ECCENTRIC (SYRINGE) ×3 IMPLANT
SYR 5ML LL (SYRINGE) ×1 IMPLANT
SYSTEM SAHARA CHEST DRAIN ATS (WOUND CARE) ×3 IMPLANT
TAPE CLOTH SURG 4X10 WHT LF (GAUZE/BANDAGES/DRESSINGS) ×1 IMPLANT
TAPE UMBILICAL COTTON 1/8X30 (MISCELLANEOUS) ×4 IMPLANT
TOWEL OR 17X24 6PK STRL BLUE (TOWEL DISPOSABLE) ×6 IMPLANT
TOWEL OR 17X26 10 PK STRL BLUE (TOWEL DISPOSABLE) ×6 IMPLANT
TRAP SPECIMEN MUCOUS 40CC (MISCELLANEOUS) ×1 IMPLANT
TRAY FOLEY CATH 16FRSI W/METER (SET/KITS/TRAYS/PACK) ×3 IMPLANT
TUBE ANAEROBIC SPECIMEN COL (MISCELLANEOUS) IMPLANT
TUBE CONNECTING 20X1/4 (TUBING) ×3 IMPLANT
TUNNELER SHEATH ON-Q 11GX8 DSP (PAIN MANAGEMENT) ×1 IMPLANT
WATER STERILE IRR 1000ML POUR (IV SOLUTION) ×6 IMPLANT

## 2016-10-29 NOTE — Brief Op Note (Addendum)
      [address]       [place] [address]             [phone]      10/29/2016  12:38 PM  PATIENT:  [name]  71 y.o. female  PRE-OPERATIVE DIAGNOSIS:  RUL LUNG CANCER  POST-OPERATIVE DIAGNOSIS:  right upper lobe lung cancer  PROCEDURE:  Procedure(s): VIDEO BRONCHOSCOPY (N/A) RIGHT  VIDEO ASSISTED THORACOSCOPY /RIGHT UPPER LOBE LUNG RESECTION  LYMPH NODE DISSECTION (Right) On q placement   SURGEON:  Surgeon(s) and Role:    * [name], MD - Primary  PHYSICIAN ASSISTANT:  Nicholes Rough, PA-C   ANESTHESIA:   general  EBL:  Total I/O In: 1500 [I.V.:1500] Out: 900 [Urine:800; Blood:100]  BLOOD ADMINISTERED:none  DRAINS: one blake and one straight chest tube   LOCAL MEDICATIONS USED:  On-Q  SPECIMEN:  Right upper lobe, lymph nodes  DISPOSITION OF SPECIMEN:  PATHOLOGY  COUNTS:  YES  DICTATION: .Dragon Dictation  PLAN OF CARE: Admit to inpatient   PATIENT DISPOSITION:  ICU - extubated and stable.   Delay start of Pharmacological VTE agent (>24hrs) due to surgical blood loss or risk of bleeding: yes

## 2016-10-29 NOTE — H&P (Signed)
[address]       [place] [address]             [phone]                    Geneieve H Navedo [place] Medical Record #[id number] Date of Birth: October 21, 1945  Referring:Dr Julien Nordmann  Primary Care: Geoffery Lyons, MD  Chief Complaint:   Lung Cancer  History of Present Illness:    MELODIE Long 71 y.o. female was seen in the office for new dx of Squamous cell lung cancer right lung. She has a historyof tobacco use (45 pack years), hypertension, hyperlipidemia, allergic rhinitis. She followed in the pulmonary clinic with  diagnosis of asthma made by about 6-8 yrs ago . She has noted  had slow progressive exertional dyspnea, underwent a CT scan of her chest 08/17/16 that shows a rounded RUL nodule with some spiculation. No cough, no sputum or hemoptysis. No CP or pleuritic sx.   She underwent bronchoscopy 08/29/16. 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  Pulmonary function testing was done on 08/24/16. This shows severe obstruction without a bronchodilator response FEV1 1.22L (57% predicted), hyperinflation, decreased diffusion capacity that does not correct the normal range for alveolar volume. She has been on Symbicort for 2 years but not regularly. Has flonase and loratadine. Uses albuterol about once a week.  CPX testing and cardiac evaluation has been completed  She denies any current GI complains   Diagnosis Lung, biopsy, Right Upper Lobe - SQUAMOUS CELL CARCINOMA. Microscopic Comment Dr. Orene Desanctis agrees. (JDP:gt, 08/30/16) Claudette Laws MD Pathologist, Electronic Signature (Case signed 08/30/2016)   Current Activity/ Functional Status:  Patient is independent with mobility/ambulation, transfers, ADL's, IADL's.   Zubrod Score: At the time of surgery this patient's most appropriate activity status/level should be described as: '[]'$     0    Normal activity, no symptoms '[x]'$     1    Restricted in physical strenuous activity  but ambulatory, able to do out light work '[]'$     2    Ambulatory and capable of self care, unable to do work activities, up and about               >50 % of waking hours                              '[]'$     3    Only limited self care, in bed greater than 50% of waking hours '[]'$     4    Completely disabled, no self care, confined to bed or chair '[]'$     5    Moribund   Past Medical History:  Diagnosis Date  . Arthritis   . Asthma    asthmatic bronchitis  . COPD (chronic obstructive pulmonary disease) ([place])   . Dyspnea   . History of kidney stones   . Hyperlipidemia   . Hypertension   . Pneumonia   . PONV (postoperative nausea and vomiting)    none with her hip surgery  . Restless leg syndrome   . 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 ABDOMINAL HYSTERECTOMY    . BONE DENSIDEXAY  06/02/2013  . CARDIAC CATHETERIZATION    . COLONOSCOPY  08/05/2015  . CYST EXCISION  from back  . CYSTO    . DENTAL EXAMINATION UNDER ANESTHESIA    . DIAGNOSTIC MAMMOGRAM  07/28/2016  . EYE SURGERY Left    cataract surgery with lens implant  . TONSILLECTOMY    . TOTAL HIP ARTHROPLASTY  10/31/2012   Procedure: TOTAL HIP ARTHROPLASTY ANTERIOR APPROACH;  Surgeon: Mcarthur Rossetti, MD;  Location: [facility];  Service: Orthopedics;  Laterality: Right;  Right Total Hip Replacement  . VIDEO BRONCHOSCOPY WITH ENDOBRONCHIAL NAVIGATION N/A 08/29/2016   Procedure: VIDEO BRONCHOSCOPY WITH ENDOBRONCHIAL NAVIGATION;  Surgeon: Collene Gobble, MD;  Location: [facility];  Service: Thoracic;  Laterality: N/A;    History reviewed. No pertinent family history.  Social History   Social History  . Marital status: Married    Spouse name: N/A  . Number of children: N/A  . Years of education: N/A         Social History Main Topics  . Smoking status: Former Smoker    Packs/day: 0.00    Quit date: 08/29/2016  . Smokeless tobacco: Never  Used  . Alcohol use No  . Drug use: No  . Sexual activity: Not on file      History  Smoking Status  . Former Smoker  . Packs/day: 0.00  . Quit date: 08/29/2016  Smokeless Tobacco  . Never Used    History  Alcohol Use No     Allergies  Allergen Reactions  . Nickel Rash    Current Facility-Administered Medications  Medication Dose Route Frequency Provider Last Rate Last Dose  . cefUROXime (ZINACEF) 1.5 g in dextrose 5 % 50 mL IVPB  1.5 g Intravenous 60 min Pre-Op [name], MD          Review of Systems:     Cardiac Review of Systems: Y or N  Chest Pain [  n  ]  Resting SOB [n ] Exertional SOB  [ y ]  Vertell Limber Florencio.Farrier  ]   Pedal Edema [ n  ]    Palpitations [n  ] Syncope  Florencio.Farrier ]   Presyncope [  n ]  General Review of Systems: [Y] = yes [  ]=no Constitional: recent weight change [ n ];  Wt loss over the last 3 months [   ] anorexia [  ]; fatigue [ y ]; nausea [  ]; night sweats [  ]; fever [  ]; or chills [  ];          Dental: poor dentition[  ]; Last Dentist visit:   Eye : blurred vision [  ]; diplopia [   ]; vision changes [  ];  Amaurosis fugax[  ]; Resp: cough [ y ];  wheezing[ y ];  hemoptysis[ n]; shortness of breath[ y ]; paroxysmal nocturnal dyspnea[  ]; dyspnea on exertion[ y ]; or orthopnea[  ];  GI:  gallstones[  ], vomiting[  ];  dysphagia[  ]; melena[  ];  hematochezia [  ]; heartburn[  ];   Hx of  Colonoscopy[  ]; GU: kidney stones [  ]; hematuria[  ];   dysuria [  ];  nocturia[  ];  history of     obstruction [  ]; urinary frequency [  ]             Skin: rash, swelling[  ];, hair loss[  ];  peripheral edema[  ];  or itching[  ]; Musculosketetal: myalgias[  ];  joint swelling[  ];  joint erythema[  ];  joint pain[  ];  back pain[  ];  Heme/Lymph: bruising[  ];  bleeding[  ];  anemia[  ];  Neuro: TIA[n  ];  headaches[  ];  stroke[  ];  vertigo[  ];  seizures[ n ];   paresthesias[n  ];  difficulty walking[ n ];  Psych:depression[  ]; anxiety[   ];  Endocrine: diabetes[ n ];  thyroid dysfunction[ n ];  Immunizations: Flu up to date Blue.Reese  ]; Pneumococcal up to date [ y ];  Other:  Physical Exam: BP 120/69   Pulse 62   Temp 98 F (36.7 C) (Oral)   Resp 18   Ht 5' 3.5" (1.613 m)   Wt 136 lb 8 oz (61.9 kg)   SpO2 100%   BMI 23.80 kg/m   PHYSICAL EXAMINATION: General appearance: alert, cooperative, appears older than stated age and no distress Head: Normocephalic, without obvious abnormality, atraumatic Neck: no adenopathy, no carotid bruit, no JVD, supple, symmetrical, trachea midline and thyroid not enlarged, symmetric, no tenderness/mass/nodules Lymph nodes: Cervical, supraclavicular, and axillary nodes normal. Resp: clear to auscultation bilaterally Back: symmetric, no curvature. ROM normal. No CVA tenderness. Cardio: regular rate and rhythm, S1, S2 normal, no murmur, click, rub or gallop GI: soft, non-tender; bowel sounds normal; no masses,  no organomegaly Extremities: extremities normal, atraumatic, no cyanosis or edema Neurologic: Grossly normal   Diagnostic Studies & Laboratory data:     Recent Radiology Findings:   Mr Jeri Cos Wo Contrast  Result Date: 09/14/2016 CLINICAL DATA:  71 y/o  F; lung cancer. EXAM: MRI HEAD WITHOUT AND WITH CONTRAST TECHNIQUE: Multiplanar, multiecho pulse sequences of the brain and surrounding structures were obtained without and with intravenous contrast. CONTRAST:  83m MULTIHANCE GADOBENATE DIMEGLUMINE 529 MG/ML IV SOLN COMPARISON:  None. FINDINGS: Brain: No acute infarction, hemorrhage, hydrocephalus, extra-axial collection or mass lesion. 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 Scattered nonspecific foci of T2 FLAIR hyperintense signal abnormality in subcortical and periventricular white matter compatible with mild chronic microvascular ischemic changes for age. Mild brain parenchymal volume loss. No abnormal enhancement. Vascular: Normal flow voids. Skull and upper cervical spine: Normal  marrow signal. Sinuses/Orbits: Negative. Other: None. IMPRESSION: 1. No evidence for intracranial metastasis. 2. Mild chronic microvascular ischemic changes and parenchymal volume loss of the brain. Electronically Signed   By: LKristine GarbeM.D.   On: 09/14/2016 20:29   Nm Pet Image Initial (pi) Skull Base To Thigh  Result Date: 09/14/2016 CLINICAL DATA:  Initial treatment strategy for right upper lobe squamous cell carcinoma. EXAM: NUCLEAR MEDICINE PET SKULL BASE TO THIGH TECHNIQUE: 6.5 mCi F-18 FDG was injected intravenously. Full-ring PET imaging was performed from the skull base to thigh after the radiotracer. CT data was obtained and used for attenuation correction and anatomic localization. FASTING BLOOD GLUCOSE:  Value: 94 mg/dl COMPARISON:  Chest CT 08/17/2016 FINDINGS: NECK No hypermetabolic cervical lymph nodes are identified.There are no lesions of the pharyngeal mucosal space. CHEST There are no hypermetabolic mediastinal, hilar or axillary lymph nodes. The dominant irregular, right upper lobe mass is hypermetabolic within SUV max of 13.1. It measures approximately 4.8 x 4.3 cm on image 25. Adjacent satellite nodularity is also hypermetabolic. There is no abnormal metabolic activity or suspicious nodularity elsewhere in the right lung or in the left lung. There is no significant pleural or pericardial effusion. Atherosclerosis of the aorta, great vessels and coronary arteries noted. ABDOMEN/PELVIS There is no hypermetabolic activity within the liver, adrenal glands, spleen or pancreas. There is  no hypermetabolic nodal activity. There is prominent metabolic activity within the gastric cardia (SUV max 9.2). This corresponds with soft tissue thickening on the CT images. This could reflect a proximal gastric mass or inflammation. Cortical scarring and calcifications are present within the left kidney. There is atherosclerosis of the aorta and its branch vessels. There is diverticulosis of the  distal colon. SKELETON There is no hypermetabolic activity to suggest osseous metastatic disease. Previous right total hip arthroplasty noted. IMPRESSION: 1. The biopsy-proven right upper lobe squamous cell carcinoma is hypermetabolic. There are adjacent satellite nodules. Some of this opacity may be secondary to postobstructive pneumonitis. 2. No nodal or distant metastases demonstrated. By imaging, this is most consistent with stage IB disease (T2a N0 M0). 3. Hypermetabolic activity within the gastric cardia suspicious for possible gastric neoplasm versus inflammation. Further evaluation with upper endoscopy recommended. Electronically Signed   By: Richardean Sale M.D.   On: 09/14/2016 12:50     I have independently reviewed the above radiologic studies.  Recent Lab Findings: Lab Results  Component Value Date   WBC 8.3 10/25/2016   HGB 14.2 10/25/2016   HCT 41.8 10/25/2016   PLT 260 10/25/2016   GLUCOSE 92 10/25/2016   ALT 19 10/25/2016   AST 22 10/25/2016   NA 136 10/25/2016   K 4.2 10/25/2016   CL 102 10/25/2016   CREATININE 0.91 10/25/2016   BUN 19 10/25/2016   CO2 23 10/25/2016   INR 0.99 10/25/2016    PFT's Dated 08/24/2016 FEV1: 1.22 57% DLCO 12.02  52% The FEV1, FEV1/FVC ratio and FEF25-75% are reduced indicating airway obstruction. The TLC, FRC and RV are increased indicating overinflation. Following administration of bronchodilators, there is no significant response. The reduced diffusing capacity indicates a moderately severe loss of functional alveolar capillary surface. 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 Demographics Age: 89  Ht. (in.) 63.5 Wt. (lb) 135.8 BMI: 23.7   Predicted Peak VO2: 18.2 Gender: Female Ht (cm) 161.3 Wt. (kg) 61.6  Results Pre-Exercise PFTs  FVC 2.73 (99%)    FEV1 1.21 (56%)     FEV1/FVC 44 (56%)     MVV 47 (55%) Exercise Time:  8:45      Watts: 80 RPE: 18 Reason stopped: Patient ended test due to leg fatigue and dyspnea (8/10) Additional symptoms: None reported Resting HR: 75 Peak HR: 129  (87% age predicted max HR) BP rest: 100/62 BP peak: 164/68 Peak VO2: 18.0 (99% predicted peak VO2) VE/VCO2 slope: 34 UES: 1.31 Peak RER: 1.09 Ventilatory Threshold: 15.5 (85% predicted or measured peak VO2) Peak RR 37 Peak Ventilation: 42.9 VE/MVV: 91.3% PETCO2 at peak: 33 O2pulse: 9  (113% predicted O2pulse)  Interpretation  Notes: Patient gave a very good effort despite her leg fatigue. Pulse-oximetry remained 95% (lowest at peak exercise) or above for the duration of exercise. Exercise was performed on a cycle ergometer starting at [facility] and increasing by 10W/min.  ECG: Resting ECG in normal sinus rhythm. HR response appropriate. There were occasional PVCs at higher intensity exercise, however there were no sustained arrhythmias and no ST-T changes. BP response appropriate for the work performed.   PFT: Pre-exercise spirometry demonstrates moderate to severe obstructive patterns. MVV below normal.   CPX: Exercise Capacity- Exercise testing  with gas exchange demonstrates a normal peak VO2 of 18.0 ml/kg/min (99% of the age/gender/weight matched sedentary norms). The RER of 1.09 indicates a near maximal effort.   Cardiovascular response- The O2pulse (a surrogate for stroke volume) increased mildly linearly with exercise intensity reaching9 ml/beat (113% predicted). DeltaVO2/Delta WR is 9.7 at peak exercise Indicating no evidence of cardiovascular impairment.  Ventilatory response- The VE/VCO2 slope is elevated and indicates Increased dead space ventilation. The oxygen  uptake efficiency slope (OUES) is normal. The VO2 at the ventilatory threshold was normal at 85% of the predicted peak VO2. At peak exercise, the ventilation reached 91% of the measured MVV and breathing reserve was 4.1 indicating ventilatory limits were reached. PETCO2 was mildly low at 33 mmHg during peak exercise.    Conclusion: Exercise testing with gas exchange demonstrates normal functional capacity when compared to matched sedentary norms. There is no indication for cardiovascular impairment. Patient is primarily ventilatory limited and is clearly of obstructive nature. VE/VCO2 is elevated and indicates noted significant dead space ventilation mostly due to obstructive lung disease. Patient's measured VO72mx is normal at 18.0 ml/kg/min and meets CPX criteria for lobectomy candidacy in risk stratification. 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 Although she does have some pulmonary limitations her CPX testing would indicate that she could tolerate lobectomy.  She is very motivated to do so and she will is aware that the best chance for cure of her lung cancer is with surgical resection.  The goals risks and alternatives of the planned surgical procedure Bronchoscopy right VATS, Lung resection   have been discussed with the patient in detail. The risks of the procedure including death, infection, stroke,  myocardial infarction, bleeding, blood transfusion have all been discussed specifically.  I have quoted SAshby Dawesa 3 % of perioperative mortality and a complication rate as high as 40 %. The patient's questions have been answered.SAURELIE DICENZOis willing  to proceed with the planned procedure.   EGrace IsaacMD      [address] [place] [address]Office [phone]  Beeper [phone] 10/29/2016 7:13 AM

## 2016-10-29 NOTE — Anesthesia Postprocedure Evaluation (Signed)
Anesthesia Post Note  Patient: [name]  Procedure(s) Performed: Procedure(s) (LRB): VIDEO BRONCHOSCOPY (N/A) RIGHT  VIDEO ASSISTED THORACOSCOPY /RIGHT UPPER LOBE LUNG RESECTION and Right Thoracotomy (Right) LYMPH NODE DISSECTION (Right)  Patient location during evaluation: PACU Anesthesia Type: General Level of consciousness: awake and alert Pain management: pain level controlled Vital Signs Assessment: post-procedure vital signs reviewed and stable Respiratory status: spontaneous breathing, nonlabored ventilation, respiratory function stable and patient connected to nasal cannula oxygen Cardiovascular status: blood pressure returned to baseline and stable Postop Assessment: no signs of nausea or vomiting Anesthetic complications: no       Last Vitals:  Vitals:   10/29/16 1515 10/29/16 1530  BP: 96/62   Pulse: 68   Resp: (!) 7   Temp:  36.3 C    Last Pain:  Vitals:   10/29/16 1530  TempSrc:   PainSc: 2                  Jamil Castillo,W. EDMOND

## 2016-10-29 NOTE — Plan of Care (Signed)
Problem: Pain Managment: Goal: General experience of comfort will improve Outcome: Progressing Pain adequately controlled with PCA  Problem: Nutrition: Goal: Adequate nutrition will be maintained Outcome: Progressing Pt tolerated clear liquids this evening   Problem: Respiratory: Goal: Respiratory status will improve Outcome: Progressing Pt is very actively participating with pulmonary toilet regimen

## 2016-10-29 NOTE — Transfer of Care (Signed)
Immediate Anesthesia Transfer of Care Note  Patient: [name]  Procedure(s) Performed: Procedure(s): VIDEO BRONCHOSCOPY (N/A) RIGHT  VIDEO ASSISTED THORACOSCOPY /RIGHT UPPER LOBE LUNG RESECTION and Right Thoracotomy (Right) LYMPH NODE DISSECTION (Right)  Patient Location: PACU  Anesthesia Type:General  Level of Consciousness: awake, alert  and oriented  Airway & Oxygen Therapy: Patient Spontanous Breathing and Patient connected to face mask oxygen  Post-op Assessment: Report given to RN, Post -op Vital signs reviewed and stable and Patient moving all extremities X 4  Post vital signs: Reviewed and stable  Last Vitals:  Vitals:   10/29/16 0551 10/29/16 1301  BP: 120/69   Pulse: 62   Resp: 18   Temp: 36.7 C 36.2 C    Last Pain:  Vitals:   10/29/16 0551  TempSrc: Oral      Patients Stated Pain Goal: 3 (70/96/28 3662)  Complications: No apparent anesthesia complications

## 2016-10-29 NOTE — Anesthesia Procedure Notes (Addendum)
Central Venous Catheter Insertion Performed by: Roderic Palau, anesthesiologist Start/End1/15/2018 7:10 AM, 10/29/2016 7:20 AM Patient location: Pre-op. Preanesthetic checklist: patient identified, IV checked, site marked, risks and benefits discussed, surgical consent, monitors and equipment checked, pre-op evaluation, timeout performed and anesthesia consent Position: Trendelenburg Lidocaine 1% used for infiltration and patient sedated Hand hygiene performed , maximum sterile barriers used  and Seldinger technique used Catheter size: 8 Fr Total catheter length 16. Central line was placed.Double lumen Procedure performed using ultrasound guided technique. Ultrasound Notes:anatomy identified, needle tip was noted to be adjacent to the nerve/plexus identified, no ultrasound evidence of intravascular and/or intraneural injection and image(s) printed for medical record Attempts: 1 Following insertion, dressing applied, line sutured and Biopatch. Post procedure assessment: blood return through all ports  Patient tolerated the procedure well with no immediate complications.

## 2016-10-29 NOTE — Progress Notes (Signed)
Patient ID: [name], female   DOB: [birth year], 71 y.o.   MRN: [id number] EVENING ROUNDS NOTE :     [address]       [place] [address]             [phone]                 Day of Surgery Procedure(s) (LRB): VIDEO BRONCHOSCOPY (N/A) RIGHT  VIDEO ASSISTED THORACOSCOPY /RIGHT UPPER LOBE LUNG RESECTION and Right Thoracotomy (Right) LYMPH NODE DISSECTION (Right)  Total Length of Stay:  LOS: 0 days  BP (!) 89/51 (BP Location: Right Arm)   Pulse (!) 59   Temp 98 F (36.7 C) (Oral)   Resp (!) 9   Ht 5' 3.5" (1.613 m)   Wt 136 lb 8 oz (61.9 kg)   SpO2 99%   BMI 23.80 kg/m   .Intake/Output      01/14 0701 - 01/15 0700 01/15 0701 - 01/16 0700   P.O.  120   I.V. (mL/kg)  2085 (33.7)   IV Piggyback  138.3   Total Intake(mL/kg)  2343.3 (37.9)   Urine (mL/kg/hr)  1175 (1.6)   Blood  100 (0.1)   Chest Tube  200 (0.3)   Total Output   1475   Net   +868.3          . bupivacaine 0.5 % ON-Q pump SINGLE CATH 400 mL    . dextrose 5 % and 0.45% NaCl 100 mL/hr (10/29/16 1407)     Lab Results  Component Value Date   WBC 8.3 10/25/2016   HGB 12.2 10/29/2016   HCT 36.0 10/29/2016   PLT 260 10/25/2016   GLUCOSE 216 (H) 10/29/2016   ALT 19 10/25/2016   AST 22 10/25/2016   NA 136 10/29/2016   K 3.5 10/29/2016   CL 102 10/25/2016   CREATININE 0.91 10/25/2016   BUN 19 10/25/2016   CO2 23 10/25/2016   INR 0.99 10/25/2016   adequate pain control, Air leak with cough    [name] MD  Beeper [phone] Office [phone] 10/29/2016 6:37 PM

## 2016-10-29 NOTE — Anesthesia Procedure Notes (Signed)
Procedure Name: Intubation Date/Time: 10/29/2016 7:48 AM Performed by: Kyung Rudd Pre-anesthesia Checklist: Patient identified, Emergency Drugs available, Suction available and Patient being monitored Patient Re-evaluated:Patient Re-evaluated prior to inductionOxygen Delivery Method: Circle system utilized Preoxygenation: Pre-oxygenation with 100% oxygen Intubation Type: IV induction Ventilation: Mask ventilation without difficulty Laryngoscope Size: Mac and 3 Grade View: Grade I Endobronchial tube: Left, Double lumen EBT, EBT position confirmed by auscultation and EBT position confirmed by fiberoptic bronchoscope and 35 Fr Number of attempts: 2 Airway Equipment and Method: Stylet Placement Confirmation: ETT inserted through vocal cords under direct vision,  positive ETCO2 and breath sounds checked- equal and bilateral Tube secured with: Tape Dental Injury: Dental damage

## 2016-10-30 ENCOUNTER — Inpatient Hospital Stay (HOSPITAL_COMMUNITY): Payer: Medicare Other

## 2016-10-30 ENCOUNTER — Encounter (HOSPITAL_COMMUNITY): Payer: Self-pay | Admitting: Cardiothoracic Surgery

## 2016-10-30 LAB — POCT I-STAT 3, ART BLOOD GAS (G3+)
Acid-base deficit: 1 mmol/L (ref 0.0–2.0)
Bicarbonate: 23.3 mmol/L (ref 20.0–28.0)
O2 Saturation: 98 %
Patient temperature: 98
TCO2: 24 mmol/L (ref 0–100)
pCO2 arterial: 35.7 mmHg (ref 32.0–48.0)
pH, Arterial: 7.422 (ref 7.350–7.450)
pO2, Arterial: 95 mmHg (ref 83.0–108.0)

## 2016-10-30 LAB — GLUCOSE, CAPILLARY
Glucose-Capillary: 123 mg/dL — ABNORMAL HIGH (ref 65–99)
Glucose-Capillary: 133 mg/dL — ABNORMAL HIGH (ref 65–99)
Glucose-Capillary: 136 mg/dL — ABNORMAL HIGH (ref 65–99)
Glucose-Capillary: 190 mg/dL — ABNORMAL HIGH (ref 65–99)
Glucose-Capillary: 177 mg/dL — ABNORMAL HIGH (ref 65–99)

## 2016-10-30 LAB — CBC
HCT: 33.1 % — ABNORMAL LOW (ref 36.0–46.0)
HEMOGLOBIN: 11.2 g/dL — AB (ref 12.0–15.0)
MCH: 30.9 pg (ref 26.0–34.0)
MCHC: 33.8 g/dL (ref 30.0–36.0)
MCV: 91.2 fL (ref 78.0–100.0)
Platelets: 181 10*3/uL (ref 150–400)
RBC: 3.63 MIL/uL — AB (ref 3.87–5.11)
RDW: 13.8 % (ref 11.5–15.5)
WBC: 12.4 10*3/uL — AB (ref 4.0–10.5)

## 2016-10-30 LAB — BASIC METABOLIC PANEL
ANION GAP: 10 (ref 5–15)
BUN: 14 mg/dL (ref 6–20)
CHLORIDE: 102 mmol/L (ref 101–111)
CO2: 23 mmol/L (ref 22–32)
Calcium: 7.9 mg/dL — ABNORMAL LOW (ref 8.9–10.3)
Creatinine, Ser: 0.7 mg/dL (ref 0.44–1.00)
GFR calc Af Amer: >60 mL/min (ref >60)
GFR calc non Af Amer: >60 mL/min (ref >60)
GLUCOSE: 124 mg/dL — AB (ref 65–99)
POTASSIUM: 3.9 mmol/L (ref 3.5–5.1)
SODIUM: 135 mmol/L (ref 135–145)

## 2016-10-30 MED ORDER — ENOXAPARIN SODIUM 30 MG/0.3ML ~~LOC~~ SOLN
30.0000 mg | SUBCUTANEOUS | Status: DC
  Administered 2016-10-30 – 2016-11-01 (×3): 30 mg via SUBCUTANEOUS
  Filled 2016-10-30 (×3): qty 0.3

## 2016-10-30 NOTE — Addendum Note (Signed)
Addendum  created 10/30/16 1791 by Kyung Rudd, CRNA   Anesthesia Intra Meds edited

## 2016-10-30 NOTE — Progress Notes (Signed)
Patient ID: [name], female   DOB: [birth year], 71 y.o.   MRN: [id number] TCTS DAILY ICU PROGRESS NOTE                   [address]            [place] [address]          [phone]   1 Day Post-Op Procedure(s) (LRB): VIDEO BRONCHOSCOPY (N/A) RIGHT  VIDEO ASSISTED THORACOSCOPY /RIGHT UPPER LOBE LUNG RESECTION and Right Thoracotomy (Right) LYMPH NODE DISSECTION (Right)  Total Length of Stay:  LOS: 1 day   Subjective: Up to chair, not walked yet, pain control ok  Objective: Vital signs in last 24 hours: Temp:  [97.2 F (36.2 C)-98.5 F (36.9 C)] 98.5 F (36.9 C) (01/16 0400) Pulse Rate:  [56-70] 62 (01/16 0700) Cardiac Rhythm: Normal sinus rhythm (01/15 2000) Resp:  [7-24] 15 (01/16 0700) BP: (83-109)/(51-83) 89/61 (01/16 0600) SpO2:  [96 %-100 %] 99 % (01/16 0700) Arterial Line BP: (91-122)/(45-67) 116/62 (01/16 0700) Weight:  [141 lb 12.1 oz (64.3 kg)] 141 lb 12.1 oz (64.3 kg) (01/16 0600)  Filed Weights   10/29/16 0551 10/30/16 0600  Weight: 136 lb 8 oz (61.9 kg) 141 lb 12.1 oz (64.3 kg)    Weight change: 5 lb 4.1 oz (2.384 kg)   Hemodynamic parameters for last 24 hours:    Intake/Output from previous day: 01/15 0701 - 01/16 0700 In: 3761.6 [P.O.:360; I.V.:3125; IV Piggyback:276.6] Out: 2105 [Urine:1655; Blood:100; Chest Tube:350]  Intake/Output this shift: No intake/output data recorded.  Current Meds: Scheduled Meds: . acetaminophen  1,000 mg Oral Q6H   Or  . acetaminophen (TYLENOL) oral liquid 160 mg/5 mL  1,000 mg Oral Q6H  . arformoterol  15 mcg Nebulization BID  . bisacodyl  10 mg Oral Daily  . fentaNYL   Intravenous Q4H  . insulin aspart  0-24 Units Subcutaneous Q4H  . levalbuterol  0.63 mg Nebulization Q6H  . mouth rinse  15 mL Mouth Rinse BID  . senna-docusate  1 tablet Oral QHS  . simvastatin  20 mg Oral QHS  . sodium chloride flush  10-40 mL Intracatheter Q12H  . [START ON 10/31/2016] tiotropium  18 mcg Inhalation  Daily   Continuous Infusions: . sodium chloride 75 mL/hr at 10/30/16 0600  . bupivacaine 0.5 % ON-Q pump SINGLE CATH 400 mL     PRN Meds:.albuterol, diphenhydrAMINE **OR** diphenhydrAMINE, naloxone **AND** sodium chloride flush, ondansetron (ZOFRAN) IV, oxyCODONE, potassium chloride (KCL MULTIRUN) 30 mEq in 265 mL IVPB, sodium chloride flush, traMADol  General appearance: alert, cooperative and no distress Neurologic: intact Heart: regular rate and rhythm, S1, S2 normal, no murmur, click, rub or gallop Lungs: diminished breath sounds bibasilar Abdomen: soft, non-tender; bowel sounds normal; no masses,  no organomegaly Extremities: extremities normal, atraumatic, no cyanosis or edema and Homans sign is negative, no sign of DVT Wound: air leak with cough  Lab Results: CBC: Recent Labs  10/29/16 1647 10/30/16 0400  WBC  --  12.4*  HGB 12.2 11.2*  HCT 36.0 33.1*  PLT  --  181   BMET:  Recent Labs  10/29/16 1647 10/30/16 0400  NA 136 135  K 3.5 3.9  CL  --  102  CO2  --  23  GLUCOSE 216* 124*  BUN  --  14  CREATININE  --  0.70  CALCIUM  --  7.9*    CMET: Lab Results  Component Value Date  WBC 12.4 (H) 10/30/2016   HGB 11.2 (L) 10/30/2016   HCT 33.1 (L) 10/30/2016   PLT 181 10/30/2016   GLUCOSE 124 (H) 10/30/2016   ALT 19 10/25/2016   AST 22 10/25/2016   NA 135 10/30/2016   K 3.9 10/30/2016   CL 102 10/30/2016   CREATININE 0.70 10/30/2016   BUN 14 10/30/2016   CO2 23 10/30/2016   INR 0.99 10/25/2016      PT/INR: No results for input(s): LABPROT, INR in the last 72 hours. Radiology: Dg Chest Port 1 View  Result Date: 10/29/2016 CLINICAL DATA:  Status post right upper lobe resection for lung cancer. EXAM: PORTABLE CHEST 1 VIEW COMPARISON:  10/25/2016 FINDINGS: Sequelae of interval right upper lobe resection are identified. A right jugular catheter terminates over the mid upper SVC. Two right-sided chest tubes have been placed and terminate at the apex. There  is a moderate-sized right pneumothorax. No mediastinal shift. Cardiomediastinal silhouette is within normal limits. Aortic atherosclerosis is noted. Minimal atelectasis is present in the lung bases. No sizable pleural effusion is seen. Soft tissue emphysema is present in the right chest wall. IMPRESSION: 1. Interval postoperative changes with support devices as above. 2. Moderate-sized right pneumothorax. 3. Minimal bibasilar atelectasis. 4. Aortic atherosclerosis. Electronically Signed   By: Logan Bores M.D.   On: 10/29/2016 13:35     Assessment/Plan: S/P Procedure(s) (LRB): VIDEO BRONCHOSCOPY (N/A) RIGHT  VIDEO ASSISTED THORACOSCOPY /RIGHT UPPER LOBE LUNG RESECTION and Right Thoracotomy (Right) LYMPH NODE DISSECTION (Right) Mobilize Diuresis Expected Acute  Blood - loss Anemia    [name] 10/30/2016 8:09 AM

## 2016-10-31 ENCOUNTER — Inpatient Hospital Stay (HOSPITAL_COMMUNITY): Payer: Medicare Other

## 2016-10-31 LAB — GLUCOSE, CAPILLARY
Glucose-Capillary: 107 mg/dL — ABNORMAL HIGH (ref 65–99)
Glucose-Capillary: 152 mg/dL — ABNORMAL HIGH (ref 65–99)
Glucose-Capillary: 169 mg/dL — ABNORMAL HIGH (ref 65–99)
Glucose-Capillary: 197 mg/dL — ABNORMAL HIGH (ref 65–99)
Glucose-Capillary: 92 mg/dL (ref 65–99)
Glucose-Capillary: 92 mg/dL (ref 65–99)
Glucose-Capillary: 96 mg/dL (ref 65–99)

## 2016-10-31 LAB — COMPREHENSIVE METABOLIC PANEL
ALK PHOS: 41 U/L (ref 38–126)
ALT: 20 U/L (ref 14–54)
AST: 31 U/L (ref 15–41)
Albumin: 2.9 g/dL — ABNORMAL LOW (ref 3.5–5.0)
Anion gap: 7 (ref 5–15)
BUN: 13 mg/dL (ref 6–20)
CALCIUM: 8.3 mg/dL — AB (ref 8.9–10.3)
CHLORIDE: 104 mmol/L (ref 101–111)
CO2: 25 mmol/L (ref 22–32)
Creatinine, Ser: 0.83 mg/dL (ref 0.44–1.00)
Glucose, Bld: 113 mg/dL — ABNORMAL HIGH (ref 65–99)
Potassium: 3.6 mmol/L (ref 3.5–5.1)
Sodium: 136 mmol/L (ref 135–145)
TOTAL PROTEIN: 5.6 g/dL — AB (ref 6.5–8.1)
Total Bilirubin: 0.4 mg/dL (ref 0.3–1.2)

## 2016-10-31 LAB — CBC
HCT: 33.9 % — ABNORMAL LOW (ref 36.0–46.0)
Hemoglobin: 11.3 g/dL — ABNORMAL LOW (ref 12.0–15.0)
MCH: 30.8 pg (ref 26.0–34.0)
MCHC: 33.3 g/dL (ref 30.0–36.0)
MCV: 92.4 fL (ref 78.0–100.0)
PLATELETS: 193 10*3/uL (ref 150–400)
RBC: 3.67 MIL/uL — AB (ref 3.87–5.11)
RDW: 14.3 % (ref 11.5–15.5)
WBC: 13.9 10*3/uL — ABNORMAL HIGH (ref 4.0–10.5)

## 2016-10-31 MED ORDER — FUROSEMIDE 10 MG/ML IJ SOLN
40.0000 mg | Freq: Once | INTRAMUSCULAR | Status: AC
  Administered 2016-10-31: 40 mg via INTRAVENOUS

## 2016-10-31 MED ORDER — POTASSIUM CHLORIDE 20 MEQ/15ML (10%) PO SOLN: 20.0000 meq | Freq: Four times a day (QID) | ORAL | Status: AC

## 2016-10-31 MED ORDER — GUAIFENESIN ER 600 MG PO TB12
600.0000 mg | ORAL_TABLET | Freq: Two times a day (BID) | ORAL | Status: DC
  Administered 2016-10-31 – 2016-11-12 (×16): 600 mg via ORAL
  Filled 2016-10-31 (×16): qty 1

## 2016-10-31 MED ORDER — FUROSEMIDE 10 MG/ML IJ SOLN
INTRAMUSCULAR | Status: AC
  Filled 2016-10-31: qty 4

## 2016-10-31 MED ORDER — POTASSIUM CHLORIDE CRYS ER 20 MEQ PO TBCR
20.0000 meq | EXTENDED_RELEASE_TABLET | Freq: Four times a day (QID) | ORAL | Status: AC
  Administered 2016-10-31 (×2): 20 meq via ORAL
  Filled 2016-10-31 (×2): qty 1

## 2016-10-31 NOTE — Care Management Note (Signed)
Case Management Note  Patient Details  Name: [name] MRN: [id number] Date of Birth: [birth year]  Subjective/Objective:  S/p lobectomy                   Action/Plan:  Pt alert and oriented during CM assessment.  Pt stated she is independent from home with husband.  Husband will be with pt post discharge as support and caregiver.  CM will continue to follow for discharge needs   Expected Discharge Date:                  Expected Discharge Plan:  Home/Self Care  In-House Referral:     Discharge planning Services  CM Consult  Post Acute Care Choice:    Choice offered to:     DME Arranged:    DME Agency:     HH Arranged:    HH Agency:     Status of Service:  In process, will continue to follow  If discussed at Long Length of Stay Meetings, dates discussed:    Additional Comments:  Maryclare Labrador, RN 10/31/2016, 10:32 AM

## 2016-10-31 NOTE — Plan of Care (Signed)
Problem: Activity: Goal: Risk for activity intolerance will decrease Outcome: Progressing Up OOB x3   Problem: Physical Regulation: Goal: Postoperative complications will be avoided or minimized Outcome: Progressing Still with air leak  Problem: Respiratory: Goal: Pain level will decrease with appropriate interventions Outcome: Progressing Minimal C/O pain

## 2016-10-31 NOTE — Progress Notes (Signed)
Patient ID: [name], female   DOB: Jan 22, 1946, 72 y.o.   MRN: [id number] TCTS DAILY ICU PROGRESS NOTE                   [address]            [place] [address]          [phone]   2 Days Post-Op Procedure(s) (LRB): VIDEO BRONCHOSCOPY (N/A) RIGHT  VIDEO ASSISTED THORACOSCOPY /RIGHT UPPER LOBE LUNG RESECTION and Right Thoracotomy (Right) LYMPH NODE DISSECTION (Right)  Total Length of Stay:  LOS: 2 days   Subjective: Walking    Objective: Vital signs in last 24 hours: Temp:  [97.5 F (36.4 C)-98.8 F (37.1 C)] 98 F (36.7 C) (01/17 0700) Pulse Rate:  [68-104] 83 (01/17 0700) Cardiac Rhythm: Normal sinus rhythm (01/17 0752) Resp:  [8-27] 16 (01/17 0752) BP: (114-145)/(62-90) 136/89 (01/17 0700) SpO2:  [90 %-100 %] 92 % (01/17 0756) Weight:  [143 lb 4.8 oz (65 kg)] 143 lb 4.8 oz (65 kg) (01/17 0500)  Filed Weights   10/29/16 0551 10/30/16 0600 10/31/16 0500  Weight: 136 lb 8 oz (61.9 kg) 141 lb 12.1 oz (64.3 kg) 143 lb 4.8 oz (65 kg)    Weight change: 1 lb 8.7 oz (0.7 kg)   Hemodynamic parameters for last 24 hours:    Intake/Output from previous day: 01/16 0701 - 01/17 0700 In: 665.8 [P.O.:350; I.V.:315.8] Out: 2445 [Urine:2325; Chest Tube:120]  Intake/Output this shift: No intake/output data recorded.  Current Meds: Scheduled Meds: . acetaminophen  1,000 mg Oral Q6H   Or  . acetaminophen (TYLENOL) oral liquid 160 mg/5 mL  1,000 mg Oral Q6H  . arformoterol  15 mcg Nebulization BID  . bisacodyl  10 mg Oral Daily  . enoxaparin (LOVENOX) injection  30 mg Subcutaneous Q24H  . fentaNYL   Intravenous Q4H  . insulin aspart  0-24 Units Subcutaneous Q4H  . levalbuterol  0.63 mg Nebulization Q6H  . mouth rinse  15 mL Mouth Rinse BID  . potassium chloride  20 mEq Oral Q6H   Or  . potassium chloride  20 mEq Oral Q6H  . senna-docusate  1 tablet Oral QHS  . simvastatin  20 mg Oral QHS  . sodium chloride flush  10-40 mL Intracatheter Q12H  .  tiotropium  18 mcg Inhalation Daily   Continuous Infusions: . sodium chloride 10 mL/hr at 10/31/16 0100  . bupivacaine 0.5 % ON-Q pump SINGLE CATH 400 mL     PRN Meds:.albuterol, diphenhydrAMINE **OR** diphenhydrAMINE, naloxone **AND** sodium chloride flush, ondansetron (ZOFRAN) IV, oxyCODONE, potassium chloride (KCL MULTIRUN) 30 mEq in 265 mL IVPB, sodium chloride flush, traMADol  General appearance: alert and cooperative Neurologic: intact Heart: regular rate and rhythm, S1, S2 normal, no murmur, click, rub or gallop Lungs: diminished breath sounds bibasilar Abdomen: soft, non-tender; bowel sounds normal; no masses,  no organomegaly Extremities: extremities normal, atraumatic, no cyanosis or edema Wound: some sq air, air leak with cough ,good fluctuation   Lab Results: CBC: Recent Labs  10/30/16 0400 10/31/16 0350  WBC 12.4* 13.9*  HGB 11.2* 11.3*  HCT 33.1* 33.9*  PLT 181 193   BMET:  Recent Labs  10/30/16 0400 10/31/16 0350  NA 135 136  K 3.9 3.6  CL 102 104  CO2 23 25  GLUCOSE 124* 113*  BUN 14 13  CREATININE 0.70 0.83  CALCIUM 7.9* 8.3*    CMET: Lab Results  Component Value Date  WBC 13.9 (H) 10/31/2016   HGB 11.3 (L) 10/31/2016   HCT 33.9 (L) 10/31/2016   PLT 193 10/31/2016   GLUCOSE 113 (H) 10/31/2016   ALT 20 10/31/2016   AST 31 10/31/2016   NA 136 10/31/2016   K 3.6 10/31/2016   CL 104 10/31/2016   CREATININE 0.83 10/31/2016   BUN 13 10/31/2016   CO2 25 10/31/2016   INR 0.99 10/25/2016   Lung cancer ([place])   Staging form: Lung, AJCC 8th Edition   - Pathologic stage from 10/23/2016: Stage IIA (pT2b, pN0, cM0) - Signed by [name], MD on 10/30/2016   PT/INR: No results for input(s): LABPROT, INR in the last 72 hours. Radiology: No results found.   Assessment/Plan: S/P Procedure(s) (LRB): VIDEO BRONCHOSCOPY (N/A) RIGHT  VIDEO ASSISTED THORACOSCOPY /RIGHT UPPER LOBE LUNG RESECTION and Right Thoracotomy (Right) LYMPH NODE DISSECTION  (Right) Mobilize Leave ct in place     [name] 10/31/2016 8:09 AM

## 2016-10-31 NOTE — Progress Notes (Signed)
CT surgery p.m. Rounds  Status post right upper lobectomy for cancer Persistent moderate air leak from chest tubes Subcutaneous air has not progressed today Patient up in chair with some ambulation today

## 2016-11-01 ENCOUNTER — Inpatient Hospital Stay (HOSPITAL_COMMUNITY): Payer: Medicare Other

## 2016-11-01 LAB — GLUCOSE, CAPILLARY
Glucose-Capillary: 115 mg/dL — ABNORMAL HIGH (ref 65–99)
Glucose-Capillary: 115 mg/dL — ABNORMAL HIGH (ref 65–99)
Glucose-Capillary: 117 mg/dL — ABNORMAL HIGH (ref 65–99)
Glucose-Capillary: 123 mg/dL — ABNORMAL HIGH (ref 65–99)
Glucose-Capillary: 127 mg/dL — ABNORMAL HIGH (ref 65–99)
Glucose-Capillary: 135 mg/dL — ABNORMAL HIGH (ref 65–99)

## 2016-11-01 LAB — CBC
HCT: 35.2 % — ABNORMAL LOW (ref 36.0–46.0)
Hemoglobin: 11.6 g/dL — ABNORMAL LOW (ref 12.0–15.0)
MCH: 30.5 pg (ref 26.0–34.0)
MCHC: 33 g/dL (ref 30.0–36.0)
MCV: 92.6 fL (ref 78.0–100.0)
Platelets: 187 10*3/uL (ref 150–400)
RBC: 3.8 MIL/uL — ABNORMAL LOW (ref 3.87–5.11)
RDW: 14.6 % (ref 11.5–15.5)
WBC: 13.6 10*3/uL — ABNORMAL HIGH (ref 4.0–10.5)

## 2016-11-01 LAB — BASIC METABOLIC PANEL
Anion gap: 8 (ref 5–15)
BUN: 8 mg/dL (ref 6–20)
CO2: 29 mmol/L (ref 22–32)
Calcium: 8.3 mg/dL — ABNORMAL LOW (ref 8.9–10.3)
Chloride: 97 mmol/L — ABNORMAL LOW (ref 101–111)
Creatinine, Ser: 0.58 mg/dL (ref 0.44–1.00)
GFR calc Af Amer: >60 mL/min (ref >60)
GFR calc non Af Amer: >60 mL/min (ref >60)
Glucose, Bld: 110 mg/dL — ABNORMAL HIGH (ref 65–99)
Potassium: 3.5 mmol/L (ref 3.5–5.1)
Sodium: 134 mmol/L — ABNORMAL LOW (ref 135–145)

## 2016-11-01 MED ORDER — POTASSIUM CHLORIDE CRYS ER 10 MEQ PO TBCR
EXTENDED_RELEASE_TABLET | ORAL | Status: AC
  Filled 2016-11-01: qty 1

## 2016-11-01 MED ORDER — LEVALBUTEROL HCL 0.63 MG/3ML IN NEBU
0.6300 mg | INHALATION_SOLUTION | Freq: Three times a day (TID) | RESPIRATORY_TRACT | Status: DC
  Administered 2016-11-02 – 2016-11-04 (×7): 0.63 mg via RESPIRATORY_TRACT
  Filled 2016-11-01 (×7): qty 3

## 2016-11-01 MED ORDER — POTASSIUM CHLORIDE CRYS ER 20 MEQ PO TBCR
20.0000 meq | EXTENDED_RELEASE_TABLET | Freq: Four times a day (QID) | ORAL | Status: AC
  Administered 2016-11-01 (×2): 20 meq via ORAL
  Filled 2016-11-01 (×2): qty 1

## 2016-11-01 MED ORDER — POTASSIUM CHLORIDE 20 MEQ/15ML (10%) PO SOLN: 20.0000 meq | Freq: Four times a day (QID) | ORAL | Status: AC

## 2016-11-01 MED ORDER — ENOXAPARIN SODIUM 40 MG/0.4ML ~~LOC~~ SOLN
40.0000 mg | SUBCUTANEOUS | Status: DC
  Administered 2016-11-02 – 2016-11-07 (×5): 40 mg via SUBCUTANEOUS
  Filled 2016-11-01 (×5): qty 0.4

## 2016-11-01 NOTE — Progress Notes (Signed)
Patient ID: [name], female   DOB: Dec 27, 1945, 71 y.o.   MRN: [id number] TCTS DAILY ICU PROGRESS NOTE                   [address]            [place] [address]          [phone]   3 Days Post-Op Procedure(s) (LRB): VIDEO BRONCHOSCOPY (N/A) RIGHT  VIDEO ASSISTED THORACOSCOPY /RIGHT UPPER LOBE LUNG RESECTION and Right Thoracotomy (Right) LYMPH NODE DISSECTION (Right)  Total Length of Stay:  LOS: 3 days   Subjective: Up to chair fells better this am. Had increased sub q air last pm when was coughing with chest tube kinked in bed rales    Objective: Vital signs in last 24 hours: Temp:  [97.6 F (36.4 C)-98.6 F (37 C)] 98 F (36.7 C) (01/18 0721) Pulse Rate:  [52-109] 75 (01/18 0817) Cardiac Rhythm: Normal sinus rhythm (01/18 0400) Resp:  [11-23] 11 (01/18 0817) BP: (112-141)/(66-112) 117/84 (01/18 0817) SpO2:  [89 %-100 %] 100 % (01/18 0800) Weight:  [140 lb 6.9 oz (63.7 kg)] 140 lb 6.9 oz (63.7 kg) (01/18 0700)  Filed Weights   10/30/16 0600 10/31/16 0500 11/01/16 0700  Weight: 141 lb 12.1 oz (64.3 kg) 143 lb 4.8 oz (65 kg) 140 lb 6.9 oz (63.7 kg)    Weight change: -2 lb 13.9 oz (-1.3 kg)   Hemodynamic parameters for last 24 hours:    Intake/Output from previous day: 01/17 0701 - 01/18 0700 In: 1064 [P.O.:840; I.V.:224] Out: 4715 [Urine:4575; Chest Tube:140]  Intake/Output this shift: Total I/O In: 34 [I.V.:34] Out: 30 [Chest Tube:30]  Current Meds: Scheduled Meds: . acetaminophen  1,000 mg Oral Q6H   Or  . acetaminophen (TYLENOL) oral liquid 160 mg/5 mL  1,000 mg Oral Q6H  . arformoterol  15 mcg Nebulization BID  . bisacodyl  10 mg Oral Daily  . [START ON 11/02/2016] enoxaparin (LOVENOX) injection  40 mg Subcutaneous Q24H  . fentaNYL   Intravenous Q4H  . guaiFENesin  600 mg Oral BID  . insulin aspart  0-24 Units Subcutaneous Q4H  . levalbuterol  0.63 mg Nebulization Q6H  . mouth rinse  15 mL Mouth Rinse BID  . potassium  chloride  20 mEq Oral Q6H   Or  . potassium chloride  20 mEq Oral Q6H  . senna-docusate  1 tablet Oral QHS  . simvastatin  20 mg Oral QHS  . sodium chloride flush  10-40 mL Intracatheter Q12H  . tiotropium  18 mcg Inhalation Daily   Continuous Infusions: . sodium chloride 10 mL/hr at 10/31/16 0100  . bupivacaine 0.5 % ON-Q pump SINGLE CATH 400 mL     PRN Meds:.albuterol, diphenhydrAMINE **OR** diphenhydrAMINE, naloxone **AND** sodium chloride flush, ondansetron (ZOFRAN) IV, oxyCODONE, potassium chloride (KCL MULTIRUN) 30 mEq in 265 mL IVPB, sodium chloride flush, traMADol  General appearance: alert, cooperative and no distress Neurologic: intact Heart: regular rate and rhythm, S1, S2 normal, no murmur, click, rub or gallop Lungs: diminished breath sounds bibasilar Abdomen: soft, non-tender; bowel sounds normal; no masses,  no organomegaly Extremities: extremities normal, atraumatic, no cyanosis or edema and Homans sign is negative, no sign of DVT Wound: ct to -10 suction with sub q air about same as last night  Lab Results: CBC: Recent Labs  10/31/16 0350 11/01/16 0415  WBC 13.9* 13.6*  HGB 11.3* 11.6*  HCT 33.9* 35.2*  PLT 193 187  BMET:  Recent Labs  10/31/16 0350 11/01/16 0415  NA 136 134*  K 3.6 3.5  CL 104 97*  CO2 25 29  GLUCOSE 113* 110*  BUN 13 8  CREATININE 0.83 0.58  CALCIUM 8.3* 8.3*    CMET: Lab Results  Component Value Date   WBC 13.6 (H) 11/01/2016   HGB 11.6 (L) 11/01/2016   HCT 35.2 (L) 11/01/2016   PLT 187 11/01/2016   GLUCOSE 110 (H) 11/01/2016   ALT 20 10/31/2016   AST 31 10/31/2016   NA 134 (L) 11/01/2016   K 3.5 11/01/2016   CL 97 (L) 11/01/2016   CREATININE 0.58 11/01/2016   BUN 8 11/01/2016   CO2 29 11/01/2016   INR 0.99 10/25/2016      PT/INR: No results for input(s): LABPROT, INR in the last 72 hours. Radiology: Dg Chest Port 1 View  Result Date: 11/01/2016 CLINICAL DATA:  Right lung surgery. EXAM: PORTABLE CHEST 1 VIEW  COMPARISON:  Yesterday FINDINGS: Two right apical chest tubes are in similar position. Right apical pneumothorax is diminished from yesterday, less than 10%. Mild basilar atelectasis. Increased chest wall emphysema. Normal heart size. Stable right IJ catheter with tip at the SVC level. IMPRESSION: 1. Diminished right apical pneumothorax, less than 10%. Chest wall emphysema is notably increased. 2. Atelectasis. Electronically Signed   By: Monte Fantasia M.D.   On: 11/01/2016 07:14     Assessment/Plan: S/P Procedure(s) (LRB): VIDEO BRONCHOSCOPY (N/A) RIGHT  VIDEO ASSISTED THORACOSCOPY /RIGHT UPPER LOBE LUNG RESECTION and Right Thoracotomy (Right) LYMPH NODE DISSECTION (Right) Mobilize Diuresis PTX decreased with suction of 10 , still with air leak     [name] 11/01/2016 9:26 AM

## 2016-11-01 NOTE — Plan of Care (Signed)
Problem: Activity: Goal: Risk for activity intolerance will decrease Outcome: Progressing Pt walked x3

## 2016-11-01 NOTE — Progress Notes (Addendum)
Order received by Dr. Servando Snare at bedside to place pt chest tube on 10cm of suction as pt is able to tolerate. Will continue to monitor closely. Eleonore Chiquito RN 2 [place]

## 2016-11-01 NOTE — Progress Notes (Signed)
      [address]       [place] [address]             [phone]      Resting comfortably  BP 121/72   Pulse (!) 102   Temp 97.7 F (36.5 C) (Oral)   Resp (!) 24   Ht 5' 3.5" (1.613 m)   Wt 140 lb 6.9 oz (63.7 kg)   SpO2 96%   BMI 24.49 kg/m    Intake/Output Summary (Last 24 hours) at 11/01/16 1933 Last data filed at 11/01/16 1900  Gross per 24 hour  Intake              254 ml  Output             3905 ml  Net            -3651 ml   Stable day, continue current care  Steven C. Roxan Hockey, MD [facility] [phone]

## 2016-11-01 NOTE — Plan of Care (Signed)
Problem: Activity: Goal: Risk for activity intolerance will decrease Outcome: Progressing Pt OOBx3   Problem: Respiratory: Goal: Chest tube patency will be maintained Outcome: Progressing Chest tube air leak remains

## 2016-11-01 NOTE — Op Note (Signed)
[name], [name]                 ACCOUNT NO.:  [id number]  MEDICAL RECORD NO.:  [id number]  LOCATION:                                 FACILITY:  PHYSICIAN:  Lanelle Bal, MD    DATE OF BIRTH:  [birth year]  DATE OF PROCEDURE:  10/29/2016 DATE OF DISCHARGE:                              OPERATIVE REPORT   PREOPERATIVE DIAGNOSIS:  Squamous cell carcinoma, right upper lobe.  POSTOPERATIVE DIAGNOSIS:  Squamous cell carcinoma, right upper lobe.  SURGICAL PROCEDURE:  Video bronchoscopy, right video-assisted thoracoscopy, mini thoracotomy, right upper lobectomy with lymph node dissection, and placement of On-Q.  SURGEON:  Lanelle Bal, M.D.  FIRST ASSISTANT:  Nicholes Rough, PA.  BRIEF HISTORY:  The patient is a 71 year old female with a long history of smoking who presented with cough.  She had been evaluated by Dr. Lamonte Sakai of Pulmonology and Pulmonary including bronchoscopy with biopsy which showed squamous cell carcinoma and a 5-cm mass in the right upper lobe.  PET scan suggested a stage I with the patient had adequate FEV 1, her diffusion capacity was decreased, however, with formal CPX testing her maximum oxygen consumption was 19.  Felt adequate for lobectomy. Preoperative clearance was also performed with the risks and options discussed with her in detail.  She was agreeable proceeding with surgical resection and signed informed consent.  DESCRIPTION OF PROCEDURE:  With a central line and arterial line in place, the patient underwent general endotracheal anesthesia with a double-lumen endotracheal tube.  Appropriate time-out was performed and through this double-lumen tube, a fiberoptic bronchoscope was passed to the subsegmental level.  Both the right and left tracheobronchial tree without evidence of endobronchial lesion.  The patient was then turned in the lateral decubitus position with the right side up which had previously been marked.  The right chest was prepped  with Betadine and draped in sterile manner.  Approximately the fourth intercostal space, small port incision was created, and a 30-degree videoscope was introduced into the chest.  The lung was deflated poorly.  There were adhesions of the lung to the posterior chest wall, though these did not appear involved with tumor.  Two additional ports sites slightly lower anterior and posterior were created.  The initial port site was then increased in size _______ working port.  Neither the fissures were not very complete.  We initially proceeded with dissection around the hilum isolating the superior right upper lobe pulmonary vein.  Preserving the branches to the middle lobe, the vein was encircled and stapled with a vascular stapler.  This gave good visualization as we dissected posteriorly of the right upper lobe branches, which were urgently dissected and the larger more anterior branch was divided given [place] access to the more posterior branch which was then also divided with a vascular stapler without difficulty.  We continued our dissection and isolated the right upper lobe bronchus which was then clamped with a green load staple.  With this, there was inflation of the middle and lower lobes.  The bronchus was divided.  We then proceeded with stapling the lung to divide the fissures which as noted neither were very complete.  The lobe was  then placed in a bag and pulled out of the incision.  Frozen section on the bronchial and vascular margins were free of tumor.  We then proceeded with lymph node dissection.  Each node area sampled and submitted, carefully labeled for pathologic examination including 2R, 4R, 10R, 11R.  We tested the bronchial stump which was air tight.  There was air leaking along the staple margins.  Additional interrupted absorbable sutures were placed to cut down on air leak as was CoSeal used.  This significantly decrease the air leak.  The middle lobe was tacked to  the lower lobe to prevent torsion.  Two 28 chest tubes were left in place, the [facility] drain posteriorly and standard chest tube anteriorly.  Using an On-Q tunneler, a catheter was placed subpleurally along the posterior ribs.  The lung reinflated.  The utilitarian incision was then closed by drilling a small hole in the posterior lower ribs and placing 2 pericostal sutures.  The muscle layer was closed with interrupted 0 Vicryl, running 2-0 Vicryl subcutaneous tissue, and 3-0 subcuticular stitch in skin edges.  Dry dressings were applied.  Estimated blood loss was less than 150 mL.  The sponge and needle count was reported as correct at the completion of procedure. The patient tolerated the procedure without obvious complication and was extubated in the operating room and transferred to the recovery room in stable condition.     Lanelle Bal, MD   ______________________________ Lanelle Bal, MD    EG/MEDQ  D:  10/31/2016  T:  11/01/2016  Job:  [id number]

## 2016-11-02 ENCOUNTER — Inpatient Hospital Stay (HOSPITAL_COMMUNITY): Payer: Medicare Other

## 2016-11-02 ENCOUNTER — Encounter: Payer: Self-pay | Admitting: *Deleted

## 2016-11-02 DIAGNOSIS — Z902 Acquired absence of lung [part of]: Secondary | ICD-10-CM

## 2016-11-02 LAB — BASIC METABOLIC PANEL
Anion gap: 10 (ref 5–15)
BUN: 11 mg/dL (ref 6–20)
CO2: 27 mmol/L (ref 22–32)
Calcium: 8.5 mg/dL — ABNORMAL LOW (ref 8.9–10.3)
Chloride: 97 mmol/L — ABNORMAL LOW (ref 101–111)
Creatinine, Ser: 0.64 mg/dL (ref 0.44–1.00)
GFR calc Af Amer: >60 mL/min (ref >60)
GFR calc non Af Amer: >60 mL/min (ref >60)
Glucose, Bld: 111 mg/dL — ABNORMAL HIGH (ref 65–99)
Potassium: 3.8 mmol/L (ref 3.5–5.1)
Sodium: 134 mmol/L — ABNORMAL LOW (ref 135–145)

## 2016-11-02 LAB — CBC
HCT: 33.9 % — ABNORMAL LOW (ref 36.0–46.0)
Hemoglobin: 11.5 g/dL — ABNORMAL LOW (ref 12.0–15.0)
MCH: 31.2 pg (ref 26.0–34.0)
MCHC: 33.9 g/dL (ref 30.0–36.0)
MCV: 91.9 fL (ref 78.0–100.0)
Platelets: 205 10*3/uL (ref 150–400)
RBC: 3.69 MIL/uL — ABNORMAL LOW (ref 3.87–5.11)
RDW: 14.3 % (ref 11.5–15.5)
WBC: 9.7 10*3/uL (ref 4.0–10.5)

## 2016-11-02 LAB — GLUCOSE, CAPILLARY
Glucose-Capillary: 104 mg/dL — ABNORMAL HIGH (ref 65–99)
Glucose-Capillary: 96 mg/dL (ref 65–99)
Glucose-Capillary: 99 mg/dL (ref 65–99)
Glucose-Capillary: 128 mg/dL — ABNORMAL HIGH (ref 65–99)
Glucose-Capillary: 115 mg/dL — ABNORMAL HIGH (ref 65–99)

## 2016-11-02 NOTE — Progress Notes (Signed)
[facility] BRIEF SICU PROGRESS NOTE  4 Days Post-Op  S/P Procedure(s) (LRB): VIDEO BRONCHOSCOPY (N/A) RIGHT  VIDEO ASSISTED THORACOSCOPY /RIGHT UPPER LOBE LUNG RESECTION and Right Thoracotomy (Right) LYMPH NODE DISSECTION (Right)   Stable day  Plan: Continue current plan  Rexene Alberts, MD 11/02/2016 5:04 PM

## 2016-11-02 NOTE — Progress Notes (Signed)
Oncology Nurse Navigator Documentation  Oncology Nurse Navigator Flowsheets 11/02/2016  Navigator Location [facility]  Navigator Encounter Type Other/I called SICU to check on Ms. Pyka.  Nurse states she is doing well but has some SQ air.  I asked the nurse to let her and her husband to call me if needed.   Treatment Phase Other  Barriers/Navigation Needs (No Data)  Interventions Other  Acuity Level 1  Acuity Level 1 Minimal follow up required  Time Spent with Patient 15

## 2016-11-02 NOTE — Plan of Care (Signed)
Problem: Activity: Goal: Risk for activity intolerance will decrease Outcome: Not Progressing Pt tires easily  Problem: Respiratory: Goal: Chest tube patency will be maintained Outcome: Not Met (add Reason) Significant air leak and subcutaneous air

## 2016-11-02 NOTE — Discharge Summary (Addendum)
Physician Discharge Summary  Patient ID: [name] MRN: [id number] DOB/AGE: 1945-12-09 71 y.o.  Admit date: 10/29/2016 Discharge date: 12/05/2016  Admission Diagnoses:  Patient Active Problem List   Diagnosis Date Noted  . Lung cancer ([place]) 10/29/2016  . COPD (chronic obstructive pulmonary disease) ([place]) 09/27/2016  . Lung mass 08/21/2016  . Tobacco use disorder 08/21/2016  . Dyspnea on exertion 08/21/2016  . Degenerative arthritis of hip 10/31/2012   Discharge Diagnoses:   Patient Active Problem List   Diagnosis Date Noted  . Atelectasis   . Acute renal failure (ARF) ([place])   . Surgery follow-up   . Chest tube in place   . Acute respiratory failure with hypoxia ([place])   . Hypotension   . S/P lobectomy of lung 11/02/2016  . Malignant neoplasm of upper lobe of right lung ([place]) 10/29/2016  . COPD (chronic obstructive pulmonary disease) ([place]) 09/27/2016  . Lung mass 08/21/2016  . Tobacco use disorder 08/21/2016  . Shortness of breath 08/21/2016  . Degenerative arthritis of hip 10/31/2012  Persistent air leak Bilateral DVTs Atrial fibrillation with RVR (converted to sinus rhythm)  Consultants: Pulmonary and physical medicine (inpatient rehab)  Discharged Condition: good  History of Present Illness:  Ms. [name] is a 71 yo female with history of nicotine abuse, HTN, hyperlipidemia, and allergic rhinitis and asthma.  She has been followed by the pulmonary clinic for the past 8 yrs for her asthma diagnosis.  The patient developed progressive shortness of breath, and a CT scan was obtained.  This revealed a RUL nodule that was spiculated in appearance.  She denied cough, hemoptysis, chest pain, or pleuritic symptoms.  She had a bronchoscopy performed on 08/29/2016 with biopsy confirmed Squamous Cell Lung Cancer that was staged as 1B.  Due to this she was referred to [facility] for surgical resection.  She was evaluated by Dr. Servando Snare on 09/27/2016 at which time he felt patient could  potentially be a surgical resection candidate.  However, she was also found to have a hypermetabolic mass in her stomach.  It was felt GI workup would need performed prior to proceeding with surgery.  She would also require Cardiopulmonary testing to evaluate if she can tolerate a full lobectomy.  GI workup was completed and it was felt the patient had gastritis and no biopsies would be indicated.  Her pulmonaary studies showed she would tolerate a lobectomy.  The risks and benefits of the procedure were explained to the patient and she was agreeable to proceed.  Hospital Course:   Mrs. [name] presented to [facility] on [facility]30[facility]2015.  She underwent video bronchoscopy,  right VATS with right upper lobe lung resection, and lymph node dissection.  She tolerated the procedure without difficulty, was extubated, and taken to the SICU in stable condition.  During her stay in the SICU the patients arterial line was removed without difficulty.  Her chest tube exhibited evidence of air leak and remained on suction.  CXR showed evidence of pneumothorax.  She developed sub cutaneous emphysema on the right side.  Her hospital course was lengthy in part due to a persistent air leak. She ultimately required an/other bronchoscopy and insertion of inter bronchial valves x 3 on 01/22. She then developed acute respiratory failure with tachycardia on 01/24. She was on a NRB. A pulmonary consult was obtained. Brovana and Spiriva were discontinued. She was  given nebs and made NPO in case of intubation as well as symptoms of nausea and vomitng. She developed leukocytosis, thrombocytopenia,  and a metabolic acidosis (going into septic shock). She was put on Vancomycin and Zosyn empirically. She required Phenylephrine, Vasopressin, and Nor epinenephrine drips for hypotension. Echo done showed preserved LV function. Duplex US of the LE showed bilateral peroneal DVTs. She was put on a heparin drip. She then went into a fib with RVR  and was put on Amiodarone. She then developed delirium and elevated creatinine. NG tube was placed. She required a feeding tube. Tube feedings were initiated and titrated accordingly. Speech pathology was consulted and recommendations were followed accordingly. She required intubation and remained sedated on the vent for several days. Gradually, her sedation was lessened and she was extubated on 02/01.  Her renal function continued to improve. She was requiring high levels of oxygen via [place] but after increased suctioning and the addition of mucomyst, her respiratory status improved . Her antibiotic was changed to [place] and she was given this for 10 days and then it was stopped. She no longer had an air leak from her chest tube and the output gradually decreased. All chest tubes were removed by 02/15. Currently, she was receiving TFs at 50 ml/hr at night only and is now on a dysphagia 3 diet. As discussed with speech pathology, will try removal of Cortrak, obtain MBS, and calorie counts. Hopefully, she will take in enough orally to require reinsertion of Cortrak. We will stop TFs for now. We will continue to follow speech pathology recommendations.She is on room air. Once she was tolerating po, she was started on Coumadin. PT and INR were monitored daily. I will give her Coumadin 4 mg tonight. Her INR this am is 1.54. Heparin drip will be stopped when INR closer to 2. She will need to have a PT and INR drawn daily. She is felt surgically stable for CIR.  Significant Diagnostic Studies: Pathology  1. Lung, resection (segmental or lobe), Right upper lobe - ADENOSQUAMOUS CARCINOMA, 5.3 CM. - MARGINS NOT INVOLVED. 2. Lymph node, biopsy, 4 R #1 - ANTHRACOTIC LYMPH NODE. - NO EVIDENCE OF MALIGNANCY. 3. Lymph node, biopsy, 4 R #2 - ANTHRACOTIC LYMPH NODE. - NO EVIDENCE OF MALIGNANCY. 4. Lymph node, biopsy, 4 R #3 - ANTHRACOTIC LYMPH NODE. - NO EVIDENCE OF MALIGNANCY. 5. Lymph node, biopsy, 10 R #1 -  ANTHRACOTIC LYMPH NODE. - NO EVIDENCE OF MALIGNANCY. 6. Lymph node, biopsy, 10 R #2 - ANTHRACOTIC LYMPH NODE. - NO EVIDENCE OF MALIGNANCY. 7. Lymph node, biopsy, 10 R #3 - ANTHRACOTIC LYMPH NODE. - NO EVIDENCE OF MALIGNANCY. 8. Lymph node, biopsy, 11 R #1 - ANTHRACOTIC LYMPH NODE. - NO EVIDENCE OF MALIGNANCY. 9. Lymph node, biopsy, 11 R #2 - ANTHRACOTIC LYMPH NODE. - NO EVIDENCE OF MALIGNANCY. 10. Lymph node, biopsy, 11 R #3 - ANTHRACOTIC LYMPH NODE. - NO EVIDENCE OF MALIGNANCY. 11. Lymph node, biopsy, 2 R - ANTHRACOTIC LYMPH NODE. - NO EVIDENCE OF MALIGNANCY. 12. Lymph node, biopsy, 12 R - ANTHRACOTIC LYMPH NODE. - NO EVIDENCE OF MALIGNANCY.  Treatments: surgery:   1.Video bronchoscopy, right video-assisted thoracoscopy, mini thoracotomy, right upper lobectomy with lymph node dissection, and placement of On-Q on/ by Dr. Servando Snare on 10/29/2016. 2. Video bronchoscopy under general anesthesia with placement of a Spiration intrabronchial valves x3 in medial and lateral segments of the middle lobe and superior segment of right lower lobe by Dr. Servando Snare on 11/05/2016.  Disposition: 01-Home or Self Care   Discharge Medications:   Allergies as of 12/05/2016      Reactions   Nickel Rash  Medication List    STOP taking these medications   albuterol 108 (90 Base) MCG/ACT inhaler Commonly known as:  PROVENTIL HFA;VENTOLIN HFA   simvastatin 20 MG tablet Commonly known as:  ZOCOR   Tiotropium Bromide-Olodaterol 2.5-2.5 MCG/ACT Aers Commonly known as:  STIOLTO RESPIMAT     TAKE these medications   acetaminophen 650 MG suppository Commonly known as:  TYLENOL Place 1 suppository (650 mg total) rectally every 4 (four) hours as needed for fever.   acetaminophen 160 MG/5ML solution Commonly known as:  TYLENOL Take 10.2 mLs (325 mg total) by mouth every 4 (four) hours as needed for mild pain or headache.   amiodarone 200 MG tablet Commonly known as:  PACERONE Take 1  tablet (200 mg total) by mouth 2 (two) times daily.   chlorhexidine 0.12 % solution Commonly known as:  PERIDEX 15 mLs by Mouth Rinse route 2 (two) times daily.   feeding supplement (ENSURE ENLIVE) Liqd Take 237 mLs by mouth 3 (three) times daily between meals.   fentaNYL 100 MCG/2ML injection Commonly known as:  SUBLIMAZE Inject 0.25-0.5 mLs (12.5-25 mcg total) into the vein every 6 (six) hours as needed for severe pain.   free water Soln Place 200 mLs into feeding tube every 6 (six) hours.   Gerhardt's butt cream Crea Apply 1 application topically 2 (two) times daily.   heparin 100-0.45 UNIT/ML-% infusion Inject 1,050 Units/hr into the vein continuous.   ipratropium 0.02 % nebulizer solution Commonly known as:  ATROVENT Take 2.5 mLs (0.5 mg total) by nebulization 3 (three) times daily.   levalbuterol 0.63 MG/3ML nebulizer solution Commonly known as:  XOPENEX Take 3 mLs (0.63 mg total) by nebulization 3 (three) times daily.   levalbuterol 0.63 MG/3ML nebulizer solution Commonly known as:  XOPENEX Take 3 mLs (0.63 mg total) by nebulization every 6 (six) hours as needed for wheezing or shortness of breath.   lip balm Oint Apply 1 application topically as needed for lip care.   magic mouthwash Soln Take 5 mLs by mouth 3 (three) times daily.   mouth rinse Liqd solution 15 mLs by Mouth Rinse route 2 times daily at 12 noon and 4 pm.   oxyCODONE 5 MG/5ML solution Commonly known as:  ROXICODONE Take 5 mLs (5 mg total) by mouth every 4 (four) hours as needed for severe pain.   pantoprazole sodium 40 mg/20 mL Pack Commonly known as:  PROTONIX Take 20 mLs (40 mg total) by mouth daily at 12 noon.   RESOURCE THICKENUP CLEAR Powd Take 120 g by mouth as needed.   sodium chloride 0.65 % Soln nasal spray Commonly known as:  OCEAN Place 1 spray into both nostrils as needed for congestion.   warfarin 2 MG tablet Commonly known as:  COUMADIN Take 2 tablets (4 mg total) by  mouth one time only at 6 PM.   zolpidem 5 MG tablet Commonly known as:  AMBIEN Take 1 tablet (5 mg total) by mouth at bedtime as needed for sleep.        SignedNani Skillern PA-C 12/05/2016, 4:00 PM

## 2016-11-02 NOTE — Care Management Note (Signed)
Case Management Note  Patient Details  Name: [name] MRN: [id number] Date of Birth: December 23, 1945  Subjective/Objective:   Pt states her spouse has arranged to provide assistance 24/7 for at least a month if needed when she is medically ready for discharge.  Has walker, cane, and shower chair.                        Expected Discharge Plan:  Home/Self Care  Discharge planning Services  CM Consult  Status of Service:  In process, will continue to follow  Girard Cooter, RN 11/02/2016, 10:30 AM

## 2016-11-02 NOTE — Progress Notes (Signed)
Notified MD of progressively worsening of subcutaneous emphysema. Orders received--stat cxr and increase suction on CT to 20, will continue to monitor.

## 2016-11-02 NOTE — Progress Notes (Signed)
Patient ID: [name], female   DOB: May 06, 1946, 71 y.o.   MRN: [id number] [facility] DAILY ICU PROGRESS NOTE                   [place]            [place] [address]          [phone]   4 Days Post-Op Procedure(s) (LRB): VIDEO BRONCHOSCOPY (N/A) RIGHT  VIDEO ASSISTED THORACOSCOPY /RIGHT UPPER LOBE LUNG RESECTION and Right Thoracotomy (Right) LYMPH NODE DISSECTION (Right)  Total Length of Stay:  LOS: 4 days   Subjective: Up in chair, sub q air decreased   Objective: Vital signs in last 24 hours: Temp:  [97.7 F (36.5 C)-98.4 F (36.9 C)] 97.9 F (36.6 C) (01/19 0400) Pulse Rate:  [75-116] 89 (01/19 0700) Cardiac Rhythm: Normal sinus rhythm (01/19 0425) Resp:  [11-27] 12 (01/19 0700) BP: (93-134)/(53-85) 120/79 (01/19 0700) SpO2:  [91 %-100 %] 98 % (01/19 0712)  Filed Weights   10/30/16 0600 10/31/16 0500 11/01/16 0700  Weight: 141 lb 12.1 oz (64.3 kg) 143 lb 4.8 oz (65 kg) 140 lb 6.9 oz (63.7 kg)    Weight change:    Hemodynamic parameters for last 24 hours:    Intake/Output from previous day: 01/18 0701 - 01/19 0700 In: 264 [I.V.:254; IV Piggyback:10] Out: 1485 [Urine:1102; Stool:253; Chest Tube:130]  Intake/Output this shift: No intake/output data recorded.  Current Meds: Scheduled Meds: . acetaminophen  1,000 mg Oral Q6H   Or  . acetaminophen (TYLENOL) oral liquid 160 mg/5 mL  1,000 mg Oral Q6H  . arformoterol  15 mcg Nebulization BID  . bisacodyl  10 mg Oral Daily  . enoxaparin (LOVENOX) injection  40 mg Subcutaneous Q24H  . fentaNYL   Intravenous Q4H  . guaiFENesin  600 mg Oral BID  . insulin aspart  0-24 Units Subcutaneous Q4H  . levalbuterol  0.63 mg Nebulization TID  . mouth rinse  15 mL Mouth Rinse BID  . senna-docusate  1 tablet Oral QHS  . simvastatin  20 mg Oral QHS  . sodium chloride flush  10-40 mL Intracatheter Q12H  . tiotropium  18 mcg Inhalation Daily   Continuous Infusions: . sodium chloride 10 mL/hr at  10/31/16 0100  . bupivacaine 0.5 % ON-Q pump SINGLE CATH 400 mL     PRN Meds:.albuterol, diphenhydrAMINE **OR** diphenhydrAMINE, naloxone **AND** sodium chloride flush, ondansetron (ZOFRAN) IV, oxyCODONE, potassium chloride (KCL MULTIRUN) 30 mEq in 265 mL IVPB, sodium chloride flush, traMADol  General appearance: alert and cooperative Neurologic: intact Heart: regular rate and rhythm, S1, S2 normal, no murmur, click, rub or gallop Lungs: diminished breath sounds bibasilar Abdomen: soft, non-tender; bowel sounds normal; no masses,  no organomegaly Extremities: extremities normal, atraumatic, no cyanosis or edema and Homans sign is negative, no sign of DVT Wound: still with air leak on 10 cm, majority of air leak from anteriro chest tube   Lab Results: CBC: Recent Labs  11/01/16 0415 11/02/16 0359  WBC 13.6* 9.7  HGB 11.6* 11.5*  HCT 35.2* 33.9*  PLT 187 205   BMET:  Recent Labs  11/01/16 0415 11/02/16 0359  NA 134* 134*  K 3.5 3.8  CL 97* 97*  CO2 29 27  GLUCOSE 110* 111*  BUN 8 11  CREATININE 0.58 0.64  CALCIUM 8.3* 8.5*    CMET: Lab Results  Component Value Date   WBC 9.7 11/02/2016   HGB 11.5 (L) 11/02/2016  HCT 33.9 (L) 11/02/2016   PLT 205 11/02/2016   GLUCOSE 111 (H) 11/02/2016   ALT 20 10/31/2016   AST 31 10/31/2016   NA 134 (L) 11/02/2016   K 3.8 11/02/2016   CL 97 (L) 11/02/2016   CREATININE 0.64 11/02/2016   BUN 11 11/02/2016   CO2 27 11/02/2016   INR 0.99 10/25/2016      PT/INR: No results for input(s): LABPROT, INR in the last 72 hours. Radiology: Dg Chest Port 1 View  Result Date: 11/02/2016 CLINICAL DATA:  Postoperative for right lung carcinoma. Subcutaneous emphysema. Chest tubes present. EXAM: PORTABLE CHEST 1 VIEW COMPARISON:  November 01, 2016 FINDINGS: Chest tubes remain on the right. Central catheter tip is in the superior vena cava. There is extensive subcutaneous air bilaterally. Only trace right apical pneumothorax apparent  currently. There is postoperative change on the right with right lower lung zone atelectatic change. Left lung clear. Heart size and pulmonary vascularity within normal limits. There is aortic atherosclerosis. No evident adenopathy. IMPRESSION: Postoperative change on the right with atelectatic change. No frank consolidation or edema. Left lung now clear. There is a minimal right apical pneumothorax with chest tubes in place on the right. There is extensive subcutaneous air, grossly stable. Stable cardiac silhouette. There is aortic atherosclerosis. Electronically Signed   By: Lowella Grip III M.D.   On: 11/02/2016 07:40     Assessment/Plan: S/P Procedure(s) (LRB): VIDEO BRONCHOSCOPY (N/A) RIGHT  VIDEO ASSISTED THORACOSCOPY /RIGHT UPPER LOBE LUNG RESECTION and Right Thoracotomy (Right) LYMPH NODE DISSECTION (Right) Mobilize Diuresis  Leave chest tubes for now  Sub q air stable       [name] 11/02/2016 7:54 AM

## 2016-11-03 ENCOUNTER — Inpatient Hospital Stay (HOSPITAL_COMMUNITY): Payer: Medicare Other

## 2016-11-03 LAB — GLUCOSE, CAPILLARY
Glucose-Capillary: 146 mg/dL — ABNORMAL HIGH (ref 65–99)
Glucose-Capillary: 84 mg/dL (ref 65–99)
Glucose-Capillary: 97 mg/dL (ref 65–99)

## 2016-11-03 NOTE — Progress Notes (Signed)
[address]       [place] [address]             [phone]        CARDIOTHORACIC SURGERY PROGRESS NOTE   R5 Days Post-Op Procedure(s) (LRB): VIDEO BRONCHOSCOPY (N/A) RIGHT  VIDEO ASSISTED THORACOSCOPY /RIGHT UPPER LOBE LUNG RESECTION and Right Thoracotomy (Right) LYMPH NODE DISSECTION (Right)  Subjective: Feels okay.  Reports subQ air stable.  Denies SOB.  Mild soreness. Not using PCA much.  Wants central line out.  Objective: Vital signs: BP Readings from Last 1 Encounters:  11/03/16 102/64   Pulse Readings from Last 1 Encounters:  11/03/16 90   Resp Readings from Last 1 Encounters:  11/03/16 20   Temp Readings from Last 1 Encounters:  11/03/16 98.1 F (36.7 C) (Oral)    Hemodynamics:    Physical Exam:  Rhythm:   sinus  Breath sounds: clear  Heart sounds:  RRR  Incisions:  Dressings intact  Abdomen:  Soft, non-distended, non-tender  Extremities:  Warm, well-perfused  Chest tubes:  trivial volume thin serosanguinous output, large continuous air leak    Intake/Output from previous day: 01/19 0701 - 01/20 0700 In: 1070 [P.O.:840; I.V.:230] Out: 1320 [Urine:1150; Chest Tube:170] Intake/Output this shift: Total I/O In: 20 [I.V.:20] Out: 100 [Chest Tube:100]  Lab Results:  CBC: Recent Labs  11/01/16 0415 11/02/16 0359  WBC 13.6* 9.7  HGB 11.6* 11.5*  HCT 35.2* 33.9*  PLT 187 205    BMET:  Recent Labs  11/01/16 0415 11/02/16 0359  NA 134* 134*  K 3.5 3.8  CL 97* 97*  CO2 29 27  GLUCOSE 110* 111*  BUN 8 11  CREATININE 0.58 0.64  CALCIUM 8.3* 8.5*     PT/INR:  No results for input(s): LABPROT, INR in the last 72 hours.  CBG (last 3)   Recent Labs  11/03/16 0039 11/03/16 0410 11/03/16 0738  GLUCAP 146* 97 84    ABG    Component Value Date/Time   PHART 7.422 10/30/2016 0416   PCO2ART 35.7 10/30/2016 0416   PO2ART 95.0 10/30/2016 0416   HCO3 23.3 10/30/2016 0416   TCO2 24 10/30/2016 0416   ACIDBASEDEF 1.0 10/30/2016 0416   O2SAT 98.0 10/30/2016 0416    CXR: PORTABLE CHEST 1 VIEW  COMPARISON:  Chest radiograph from one day prior.  FINDINGS: Right internal jugular central venous catheter terminates in the upper third of the superior vena cava. Two right apical chest tubes are stable in position. Extensive subcutaneous emphysema is seen throughout the bilateral lower neck and bilateral chest wall, not appreciably changed. Stable cardiomediastinal silhouette with normal heart size and aortic atherosclerosis. Suture line overlies the right parahilar region. Stable volume loss in the right hemithorax. Small right apical pneumothorax has not appreciably changed. No left pneumothorax. No pleural effusions. Mild hazy bibasilar lung opacities appear stable.  IMPRESSION: 1. No appreciable change in small right apical pneumothorax with two stable right apical chest tubes. 2. Stable volume loss in the right hemithorax. 3. Stable mild hazy bibasilar lung opacities, favor atelectasis. 4. Stable extensive subcutaneous emphysema in the bilateral chest wall and lower neck. 5. Aortic atherosclerosis.   Electronically Signed   By: Ilona Sorrel M.D.   On: 11/03/2016 08:11   Assessment/Plan: S/P Procedure(s) (LRB): VIDEO BRONCHOSCOPY (N/A) RIGHT  VIDEO ASSISTED THORACOSCOPY /RIGHT UPPER LOBE LUNG RESECTION and Right Thoracotomy (Right) LYMPH NODE DISSECTION (Right)  Large continuous air leak persists CXR stable SubQ air stable  Will leave tube on 10 cm H2O suction for now  D/C old central line  D/C PCA  Mobilize  Rexene Alberts, MD 11/03/2016 10:43 AM

## 2016-11-03 NOTE — Progress Notes (Signed)
11/03/2016 1200 5 ml Fentanyl wasted in sharps. PCA d/c per orders. Witnessed by Lynelle Smoke RN.  Illya Gienger, Arville Lime

## 2016-11-04 ENCOUNTER — Inpatient Hospital Stay (HOSPITAL_COMMUNITY): Payer: Medicare Other

## 2016-11-04 MED ORDER — LEVALBUTEROL HCL 0.63 MG/3ML IN NEBU
0.6300 mg | INHALATION_SOLUTION | Freq: Two times a day (BID) | RESPIRATORY_TRACT | Status: DC
  Administered 2016-11-04 – 2016-11-07 (×5): 0.63 mg via RESPIRATORY_TRACT
  Filled 2016-11-04 (×5): qty 3

## 2016-11-04 NOTE — Progress Notes (Signed)
      [address]       [place] [address]             [phone]      Chest xray stable persistent air leak Discussed with patient proceeding with bronchoscopy under general anthesthia and placement IBV valves to attempt to decrease air leak. She is aware that in future, 6-8 weeks repeat bronchoscopy is needed to remove valves . Other options discussed . Patient agreeable with proceeding in am if no improvement in leak.  [name] MD      [address] [place] [address] Office [phone]   [place]

## 2016-11-04 NOTE — Progress Notes (Signed)
      [address]       [place] [address]             [phone]        CARDIOTHORACIC SURGERY PROGRESS NOTE   R6 Days Post-Op Procedure(s) (LRB): VIDEO BRONCHOSCOPY (N/A) RIGHT  VIDEO ASSISTED THORACOSCOPY /RIGHT UPPER LOBE LUNG RESECTION and Right Thoracotomy (Right) LYMPH NODE DISSECTION (Right)  Subjective: Feels a little better today.  Mild soreness.  Gets breathless w/ activity.  SubQ air improving slowly  Objective: Vital signs: BP Readings from Last 1 Encounters:  11/04/16 111/70   Pulse Readings from Last 1 Encounters:  11/04/16 80   Resp Readings from Last 1 Encounters:  11/04/16 17   Temp Readings from Last 1 Encounters:  11/03/16 98.3 F (36.8 C) (Axillary)    Hemodynamics:    Physical Exam:  Rhythm:   sinus  Breath sounds: clear  Heart sounds:  RRR  Incisions:  Dressings dry, intact  Abdomen:  Soft, non-distended, non-tender  Extremities:  Warm, well-perfused  Chest tubes:  trivial volume thin serosanguinous output, large continuous air leak persists    Intake/Output from previous day: 01/20 0701 - 01/21 0700 In: 1000 [P.O.:960; I.V.:40] Out: 640 [Urine:400; Chest Tube:240] Intake/Output this shift: No intake/output data recorded.  Lab Results:  CBC: Recent Labs  11/02/16 0359  WBC 9.7  HGB 11.5*  HCT 33.9*  PLT 205    BMET:  Recent Labs  11/02/16 0359  NA 134*  K 3.8  CL 97*  CO2 27  GLUCOSE 111*  BUN 11  CREATININE 0.64  CALCIUM 8.5*     PT/INR:  No results for input(s): LABPROT, INR in the last 72 hours.  CBG (last 3)   Recent Labs  11/03/16 0039 11/03/16 0410 11/03/16 0738  GLUCAP 146* 97 84    ABG    Component Value Date/Time   PHART 7.422 10/30/2016 0416   PCO2ART 35.7 10/30/2016 0416   PO2ART 95.0 10/30/2016 0416   HCO3 23.3 10/30/2016 0416   TCO2 24 10/30/2016 0416   ACIDBASEDEF 1.0 10/30/2016 0416   O2SAT 98.0 10/30/2016 0416    CXR: PORTABLE CHEST 1 VIEW  COMPARISON:   November 03, 2016  FINDINGS: Extensive subcutaneous emphysema seen over both sides of the chest and neck, right greater than left. The amount of subcutaneous air is similar in the interval. Two right-sided chest tubes remain in place. The right apical pneumothorax measures 3 cm at the apex today versus 4.5 cm yesterday, slightly smaller in the interval. No left-sided pneumothorax is identified. Hazy opacity is seen in the lung bases, unchanged. No change in the cardiomediastinal silhouette.  IMPRESSION: 1. The right-sided pneumothorax is a little smaller in the interval and 2 right-sided chest tubes remain. 2. Stable subcutaneous air in the chest wall and neck, right greater than left. 3. Stable hazy opacity in the lung bases, right greater than left.   Electronically Signed   By: Dorise Bullion III M.D   On: 11/04/2016 07:28   Assessment/Plan: S/P Procedure(s) (LRB): VIDEO BRONCHOSCOPY (N/A) RIGHT  VIDEO ASSISTED THORACOSCOPY /RIGHT UPPER LOBE LUNG RESECTION and Right Thoracotomy (Right) LYMPH NODE DISSECTION (Right)  Clinically stable Tentatively for endobronchial valve(s) tomorrow Mobilize as much as possible  Rexene Alberts, MD 11/04/2016 10:16 AM

## 2016-11-05 ENCOUNTER — Inpatient Hospital Stay (HOSPITAL_COMMUNITY): Payer: Medicare Other | Admitting: Anesthesiology

## 2016-11-05 ENCOUNTER — Inpatient Hospital Stay (HOSPITAL_COMMUNITY): Payer: Medicare Other

## 2016-11-05 ENCOUNTER — Encounter (HOSPITAL_COMMUNITY)
Admission: RE | Disposition: A | Discharge status: Rehabilitation Facility | Payer: Self-pay | Source: Ambulatory Visit | Attending: Cardiothoracic Surgery

## 2016-11-05 DIAGNOSIS — J95812 Postprocedural air leak: Secondary | ICD-10-CM

## 2016-11-05 HISTORY — PX: VIDEO BRONCHOSCOPY WITH INSERTION OF INTERBRONCHIAL VALVE (IBV): SHX6178

## 2016-11-05 LAB — CBC
HCT: 30.7 % — ABNORMAL LOW (ref 36.0–46.0)
Hemoglobin: 10.2 g/dL — ABNORMAL LOW (ref 12.0–15.0)
MCH: 30.3 pg (ref 26.0–34.0)
MCHC: 33.2 g/dL (ref 30.0–36.0)
MCV: 91.1 fL (ref 78.0–100.0)
Platelets: 247 10*3/uL (ref 150–400)
RBC: 3.37 MIL/uL — ABNORMAL LOW (ref 3.87–5.11)
RDW: 13.8 % (ref 11.5–15.5)
WBC: 8.4 10*3/uL (ref 4.0–10.5)

## 2016-11-05 LAB — BASIC METABOLIC PANEL
Anion gap: 8 (ref 5–15)
BUN: 10 mg/dL (ref 6–20)
CO2: 28 mmol/L (ref 22–32)
Calcium: 8.6 mg/dL — ABNORMAL LOW (ref 8.9–10.3)
Chloride: 98 mmol/L — ABNORMAL LOW (ref 101–111)
Creatinine, Ser: 0.61 mg/dL (ref 0.44–1.00)
GFR calc Af Amer: >60 mL/min (ref >60)
GFR calc non Af Amer: >60 mL/min (ref >60)
Glucose, Bld: 114 mg/dL — ABNORMAL HIGH (ref 65–99)
Potassium: 3.7 mmol/L (ref 3.5–5.1)
Sodium: 134 mmol/L — ABNORMAL LOW (ref 135–145)

## 2016-11-05 SURGERY — BRONCHOSCOPY, FLEXIBLE, WITH INTRABRONCHIAL VALVE INSERTION
Anesthesia: General

## 2016-11-05 MED ORDER — MIDAZOLAM HCL 2 MG/2ML IJ SOLN
INTRAMUSCULAR | Status: AC
  Filled 2016-11-05: qty 2

## 2016-11-05 MED ORDER — 0.9 % SODIUM CHLORIDE (POUR BTL) OPTIME
TOPICAL | Status: DC | PRN
  Administered 2016-11-05: 1000 mL

## 2016-11-05 MED ORDER — LIDOCAINE HCL (CARDIAC) 20 MG/ML IV SOLN
INTRAVENOUS | Status: DC | PRN
  Administered 2016-11-05: 60 mg via INTRATRACHEAL

## 2016-11-05 MED ORDER — LIDOCAINE 2% (20 MG/ML) 5 ML SYRINGE
INTRAMUSCULAR | Status: AC
  Filled 2016-11-05: qty 10

## 2016-11-05 MED ORDER — SUGAMMADEX SODIUM 200 MG/2ML IV SOLN
INTRAVENOUS | Status: AC
  Filled 2016-11-05: qty 2

## 2016-11-05 MED ORDER — PROPOFOL 10 MG/ML IV BOLUS
INTRAVENOUS | Status: AC
  Filled 2016-11-05: qty 20

## 2016-11-05 MED ORDER — ONDANSETRON HCL 4 MG/2ML IJ SOLN
INTRAMUSCULAR | Status: AC
  Filled 2016-11-05: qty 2

## 2016-11-05 MED ORDER — SUGAMMADEX SODIUM 200 MG/2ML IV SOLN
INTRAVENOUS | Status: DC | PRN
  Administered 2016-11-05: 200 mg via INTRAVENOUS

## 2016-11-05 MED ORDER — LACTATED RINGERS IV SOLN
INTRAVENOUS | Status: DC | PRN
  Administered 2016-11-05: 07:00:00 via INTRAVENOUS

## 2016-11-05 MED ORDER — FENTANYL CITRATE (PF) 100 MCG/2ML IJ SOLN: 25.0000 ug | INTRAMUSCULAR | Status: DC | PRN

## 2016-11-05 MED ORDER — FENTANYL CITRATE (PF) 250 MCG/5ML IJ SOLN
INTRAMUSCULAR | Status: AC
Start: 2016-11-05 — End: 2016-11-05
  Filled 2016-11-05: qty 5

## 2016-11-05 MED ORDER — ARTIFICIAL TEARS OP OINT
TOPICAL_OINTMENT | OPHTHALMIC | Status: AC
  Filled 2016-11-05: qty 3.5

## 2016-11-05 MED ORDER — ROCURONIUM BROMIDE 100 MG/10ML IV SOLN
INTRAVENOUS | Status: DC | PRN
  Administered 2016-11-05: 40 mg via INTRAVENOUS

## 2016-11-05 MED ORDER — PHENYLEPHRINE HCL 10 MG/ML IJ SOLN
INTRAVENOUS | Status: DC | PRN
  Administered 2016-11-05: 20 ug/min via INTRAVENOUS

## 2016-11-05 MED ORDER — ROCURONIUM BROMIDE 50 MG/5ML IV SOSY
PREFILLED_SYRINGE | INTRAVENOUS | Status: AC
  Filled 2016-11-05: qty 5

## 2016-11-05 MED ORDER — PROPOFOL 10 MG/ML IV BOLUS
INTRAVENOUS | Status: DC | PRN
  Administered 2016-11-05: 20 mg via INTRAVENOUS
  Administered 2016-11-05: 100 mg via INTRAVENOUS

## 2016-11-05 MED ORDER — ONDANSETRON HCL 4 MG/2ML IJ SOLN
INTRAMUSCULAR | Status: DC | PRN
  Administered 2016-11-05: 4 mg via INTRAVENOUS

## 2016-11-05 MED ORDER — FENTANYL CITRATE (PF) 250 MCG/5ML IJ SOLN
INTRAMUSCULAR | Status: DC | PRN
  Administered 2016-11-05 (×2): 50 ug via INTRAVENOUS

## 2016-11-05 SURGICAL SUPPLY — 34 items
CANISTER SUCTION 2500CC (MISCELLANEOUS) ×2 IMPLANT
CATH BALLN 4FR (CATHETERS) ×1 IMPLANT
CATH EMB 4FR 80CM (CATHETERS) IMPLANT
CATH EMB 5FR 80CM (CATHETERS) ×1 IMPLANT
CATH EMB 6FR 80CM (CATHETERS) ×1 IMPLANT
CATH LOADER DEPLOYMENT HUD (CATHETERS) ×1 IMPLANT
CONT SPEC 4OZ CLIKSEAL STRL BL (MISCELLANEOUS) ×4 IMPLANT
COVER TABLE BACK 60X90 (DRAPES) ×2 IMPLANT
DRSG AQUACEL AG ADV 3.5X14 (GAUZE/BANDAGES/DRESSINGS) ×2 IMPLANT
FILTER STRAW FLUID ASPIR (MISCELLANEOUS) IMPLANT
FORCEPS BIOP RJ4 1.8 (CUTTING FORCEPS) IMPLANT
GAUZE SPONGE 4X4 12PLY STRL (GAUZE/BANDAGES/DRESSINGS) ×2 IMPLANT
GLOVE BIO SURGEON STRL SZ 6.5 (GLOVE) ×2 IMPLANT
GLOVE SKINSENSE NS SZ7.0 (GLOVE) ×2
GLOVE SKINSENSE STRL SZ7.0 (GLOVE) ×2 IMPLANT
GOWN STRL REUS W/ TWL LRG LVL3 (GOWN DISPOSABLE) ×2 IMPLANT
GOWN STRL REUS W/TWL LRG LVL3 (GOWN DISPOSABLE) ×4
KIT AIRWAY SIZING HUD (KITS) ×1 IMPLANT
KIT CLEAN ENDO COMPLIANCE (KITS) ×2 IMPLANT
KIT ROOM TURNOVER OR (KITS) ×2 IMPLANT
MARKER SKIN DUAL TIP RULER LAB (MISCELLANEOUS) ×2 IMPLANT
NS IRRIG 1000ML POUR BTL (IV SOLUTION) ×2 IMPLANT
OIL SILICONE PENTAX (PARTS (SERVICE/REPAIRS)) IMPLANT
PAD ARMBOARD 7.5X6 YLW CONV (MISCELLANEOUS) ×4 IMPLANT
STOPCOCK MORSE 400PSI 3WAY (MISCELLANEOUS) ×2 IMPLANT
SYR 20ML ECCENTRIC (SYRINGE) ×2 IMPLANT
SYR 5ML LL (SYRINGE) ×2 IMPLANT
SYRINGE 10CC LL (SYRINGE) ×2 IMPLANT
SYRINGE 3CC LL L/F (MISCELLANEOUS) ×3 IMPLANT
TOWEL NATURAL 4PK STERILE (DISPOSABLE) ×2 IMPLANT
TRAP SPECIMEN MUCOUS 40CC (MISCELLANEOUS) ×2 IMPLANT
TUBE CONNECTING 20X1/4 (TUBING) ×2 IMPLANT
VALVE IN CARTRIDGE 7MM HUD (Valve) ×2 IMPLANT
VALVE IN CARTRIDGE 9MM HUD (Valve) ×4 IMPLANT

## 2016-11-05 NOTE — Interval H&P Note (Signed)
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 After consideration of risks, benefits and other options for treatment, the patient has consented to  Procedure(s): VIDEO BRONCHOSCOPY WITH INSERTION OF INTERBRONCHIAL VALVE (IBV) (N/A) as a surgical intervention .  The patient's history has been reviewed, patient examined, no change in status, stable for surgery.  I have reviewed the patient's chart and labs.  Questions were answered to the patient's satisfaction.    Lab Results  Component Value Date   WBC 8.4 11/05/2016   HGB 10.2 (L) 11/05/2016   HCT 30.7 (L) 11/05/2016   PLT 247 11/05/2016   GLUCOSE 114 (H) 11/05/2016   ALT 20 10/31/2016   AST 31 10/31/2016   NA 134 (L) 11/05/2016   K 3.7 11/05/2016   CL 98 (L) 11/05/2016   CREATININE 0.61 11/05/2016   BUN 10 11/05/2016   CO2 28 11/05/2016   INR 0.99 10/25/2016    [name]

## 2016-11-05 NOTE — Anesthesia Preprocedure Evaluation (Signed)
Anesthesia Evaluation  Patient identified by MRN, date of birth, ID band Patient awake    Reviewed: Allergy & Precautions, H&P , NPO status , Patient's Chart, lab work & pertinent test results  History of Anesthesia Complications (+) PONV and history of anesthetic complications  Airway Mallampati: II  TM Distance: >3 FB Neck ROM: Full    Dental no notable dental hx. (+) Teeth Intact, Dental Advisory Given   Pulmonary shortness of breath, asthma , COPD,  COPD inhaler, former smoker,    Pulmonary exam normal breath sounds clear to auscultation       Cardiovascular hypertension, Pt. on medications  Rhythm:Regular Rate:Normal     Neuro/Psych PSYCHIATRIC DISORDERS negative neurological ROS     GI/Hepatic negative GI ROS, Neg liver ROS,   Endo/Other  negative endocrine ROS  Renal/GU negative Renal ROS  negative genitourinary   Musculoskeletal  (+) Arthritis ,   Abdominal   Peds  Hematology negative hematology ROS (+)   Anesthesia Other Findings   Reproductive/Obstetrics negative OB ROS                             Anesthesia Physical Anesthesia Plan  ASA: III  Anesthesia Plan: General   Post-op Pain Management:    Induction: Intravenous  Airway Management Planned: Oral ETT  Additional Equipment: None  Intra-op Plan:   Post-operative Plan: Extubation in OR  Informed Consent: I have reviewed the patients History and Physical, chart, labs and discussed the procedure including the risks, benefits and alternatives for the proposed anesthesia with the patient or authorized representative who has indicated his/her understanding and acceptance.   Dental advisory given  Plan Discussed with: CRNA and Surgeon  Anesthesia Plan Comments:         Anesthesia Quick Evaluation

## 2016-11-05 NOTE — H&P (View-Only) (Signed)
      [address]       [place] [address]             [phone]      Chest xray stable persistent air leak Discussed with patient proceeding with bronchoscopy under general anthesthia and placement IBV valves to attempt to decrease air leak. She is aware that in future, 6-8 weeks repeat bronchoscopy is needed to remove valves . Other options discussed . Patient agreeable with proceeding in am if no improvement in leak.  [name] MD      [address] [place] [address] Office [phone]   [place]

## 2016-11-05 NOTE — Anesthesia Postprocedure Evaluation (Addendum)
Anesthesia Post Note  Patient: STANA BAYON  Procedure(s) Performed: Procedure(s) (LRB): VIDEO BRONCHOSCOPY WITH INSERTION OF INTERBRONCHIAL VALVE (IBV) times three (N/A)  Patient location during evaluation: PACU Anesthesia Type: General Level of consciousness: awake Pain management: pain level controlled Vital Signs Assessment: post-procedure vital signs reviewed and stable Respiratory status: spontaneous breathing, nonlabored ventilation, respiratory function stable and patient connected to nasal cannula oxygen Cardiovascular status: blood pressure returned to baseline and stable Postop Assessment: no signs of nausea or vomiting Anesthetic complications: no       Last Vitals:  Vitals:   11/05/16 1453 11/05/16 1500  BP:  109/67  Pulse: 86 84  Resp: 17 17  Temp:      Last Pain:  Vitals:   11/05/16 1453  TempSrc:   PainSc: 3                  Mava Suares Drzewiecki

## 2016-11-05 NOTE — Progress Notes (Signed)
CT surgery p.m. Rounds  Patient just back from walk States she feels stronger Airleak appears improved after endobronchial valve placement Saturation 95% on 2 L Continue current care

## 2016-11-05 NOTE — Brief Op Note (Signed)
      [address]       [place] [address]             [phone]       11/05/2016  9:38 AM  PATIENT:  [name]  71 y.o. female  PRE-OPERATIVE DIAGNOSIS:  AIR LEAK post lobectomy   POST-OPERATIVE DIAGNOSIS:  Same   PROCEDURE:  Procedure(s): VIDEO BRONCHOSCOPY WITH INSERTION OF INTERBRONCHIAL VALVE (IBV) times three (N/A)  SURGEON:  Surgeon(s) and Role:    * [name], MD - Primary   ANESTHESIA:   general  EBL:  Total I/O In: 800 [I.V.:800] Out: -   BLOOD ADMINISTERED:none  DRAINS: none   LOCAL MEDICATIONS USED:  NONE  SPECIMEN:  Source of Specimen:  culture of bronchial washings   DISPOSITION OF SPECIMEN:  N/A  COUNTS:  YES  DICTATION: .Dragon Dictation  PLAN OF CARE: extubated and to recovery room  PATIENT DISPOSITION:  PACU - hemodynamically stable.   Delay start of Pharmacological VTE agent (>24hrs) due to surgical blood loss or risk of bleeding: yes

## 2016-11-05 NOTE — Anesthesia Procedure Notes (Signed)
Procedure Name: Intubation Date/Time: 11/05/2016 7:45 AM Performed by: Mariea Clonts Pre-anesthesia Checklist: Patient identified, Emergency Drugs available, Suction available and Patient being monitored Patient Re-evaluated:Patient Re-evaluated prior to inductionOxygen Delivery Method: Circle System Utilized Preoxygenation: Pre-oxygenation with 100% oxygen Intubation Type: IV induction Ventilation: Mask ventilation without difficulty Laryngoscope Size: Miller and 2 Grade View: Grade II Tube type: Oral Tube size: 8.0 mm Number of attempts: 1 Airway Equipment and Method: Stylet and Oral airway Placement Confirmation: ETT inserted through vocal cords under direct vision,  positive ETCO2 and breath sounds checked- equal and bilateral Tube secured with: Tape Dental Injury: Teeth and Oropharynx as per pre-operative assessment

## 2016-11-05 NOTE — Transfer of Care (Signed)
Immediate Anesthesia Transfer of Care Note  Patient: [name]  Procedure(s) Performed: Procedure(s): VIDEO BRONCHOSCOPY WITH INSERTION OF INTERBRONCHIAL VALVE (IBV) times three (N/A)  Patient Location: PACU  Anesthesia Type:General  Level of Consciousness: awake, alert  and oriented  Airway & Oxygen Therapy: Patient Spontanous Breathing and Patient connected to nasal cannula oxygen  Post-op Assessment: Report given to RN, Post -op Vital signs reviewed and stable and Patient moving all extremities X 4  Post vital signs: Reviewed and stable  Last Vitals:  Vitals:   11/05/16 0600 11/05/16 0943  BP: 130/84 (!) 105/53  Pulse: 86 96  Resp: (!) 22 17  Temp:  36.2 C    Last Pain:  Vitals:   11/05/16 0400  TempSrc: Oral  PainSc:       Patients Stated Pain Goal: 3 (86/76/19 5093)  Complications: No apparent anesthesia complications

## 2016-11-06 ENCOUNTER — Inpatient Hospital Stay (HOSPITAL_COMMUNITY): Payer: Medicare Other

## 2016-11-06 ENCOUNTER — Encounter (HOSPITAL_COMMUNITY): Payer: Self-pay | Admitting: Cardiothoracic Surgery

## 2016-11-06 NOTE — Progress Notes (Signed)
Patient ID: [name], female   DOB: [birth year], 71 y.o.   MRN: [id number] [facility] DAILY ICU PROGRESS NOTE                   [address]            [place] [address]          [phone]   1 Day Post-Op Procedure(s) (LRB): VIDEO BRONCHOSCOPY WITH INSERTION OF INTERBRONCHIAL VALVE (IBV) times three (N/A) Day 7: Post-Op Procedure(s) (LRB): VIDEO BRONCHOSCOPY (N/A) RIGHT  VIDEO ASSISTED THORACOSCOPY /RIGHT UPPER LOBE LUNG RESECTION and Right Thoracotomy (Right) LYMPH NODE DISSECTION (Right)  Total Length of Stay:  LOS: 8 days   Subjective: Up to cahir stable, decreasing subq air   Objective: Vital signs in last 24 hours: Temp:  [97.1 F (36.2 C)-98.7 F (37.1 C)] 98.6 F (37 C) (01/23 0737) Pulse Rate:  [76-104] 77 (01/23 0800) Cardiac Rhythm: Normal sinus rhythm;Sinus tachycardia (01/23 0800) Resp:  [12-22] 17 (01/23 0800) BP: (91-116)/(51-93) 106/63 (01/23 0800) SpO2:  [89 %-100 %] 100 % (01/23 0857) Weight:  [142 lb 10.2 oz (64.7 kg)] 142 lb 10.2 oz (64.7 kg) (01/23 0400)  Filed Weights   10/31/16 0500 11/01/16 0700 11/06/16 0400  Weight: 143 lb 4.8 oz (65 kg) 140 lb 6.9 oz (63.7 kg) 142 lb 10.2 oz (64.7 kg)    Weight change:    Hemodynamic parameters for last 24 hours:    Intake/Output from previous day: 01/22 0701 - 01/23 0700 In: 1760 [P.O.:960; I.V.:800] Out: 430 [Urine:125; Blood:5; Chest Tube:300]  Intake/Output this shift: No intake/output data recorded.  Current Meds: Scheduled Meds: . arformoterol  15 mcg Nebulization BID  . bisacodyl  10 mg Oral Daily  . enoxaparin (LOVENOX) injection  40 mg Subcutaneous Q24H  . guaiFENesin  600 mg Oral BID  . levalbuterol  0.63 mg Nebulization BID  . mouth rinse  15 mL Mouth Rinse BID  . senna-docusate  1 tablet Oral QHS  . simvastatin  20 mg Oral QHS  . sodium chloride flush  10-40 mL Intracatheter Q12H  . tiotropium  18 mcg Inhalation Daily   Continuous Infusions: . sodium chloride 10  mL/hr at 11/03/16 0400   PRN Meds:.albuterol, oxyCODONE, potassium chloride (KCL MULTIRUN) 30 mEq in 265 mL IVPB, sodium chloride flush, traMADol  General appearance: alert and cooperative Neurologic: intact Heart: regular rate and rhythm, S1, S2 normal, no murmur, click, rub or gallop Lungs: diminished breath sounds bibasilar Abdomen: soft, non-tender; bowel sounds normal; no masses,  no organomegaly Extremities: extremities normal, atraumatic, no cyanosis or edema and Homans sign is negative, no sign of DVT Wound: still with air leak on 10 cm suction, decreased from yesterday  Lab Results: CBC:  Recent Labs  11/05/16 0247  WBC 8.4  HGB 10.2*  HCT 30.7*  PLT 247   BMET:   Recent Labs  11/05/16 0247  NA 134*  K 3.7  CL 98*  CO2 28  GLUCOSE 114*  BUN 10  CREATININE 0.61  CALCIUM 8.6*    CMET: Lab Results  Component Value Date   WBC 8.4 11/05/2016   HGB 10.2 (L) 11/05/2016   HCT 30.7 (L) 11/05/2016   PLT 247 11/05/2016   GLUCOSE 114 (H) 11/05/2016   ALT 20 10/31/2016   AST 31 10/31/2016   NA 134 (L) 11/05/2016   K 3.7 11/05/2016   CL 98 (L) 11/05/2016   CREATININE 0.61 11/05/2016   BUN 10 11/05/2016  CO2 28 11/05/2016   INR 0.99 10/25/2016      PT/INR: No results for input(s): LABPROT, INR in the last 72 hours. Radiology: Dg Chest Port 1 View  Result Date: 11/06/2016 CLINICAL DATA:  Chest tube present. EXAM: PORTABLE CHEST 1 VIEW COMPARISON:  Radiograph of November 05, 2016. FINDINGS: Atherosclerosis of thoracic aorta is noted. Cardiomediastinal silhouette appears normal. Two right-sided chest tubes are again noted and unchanged in position. Mild to moderate right apical pneumothorax is noted which is significantly improved compared to prior exam. There remains a large amount of subcutaneous emphysema over the right lateral chest wall and supraclavicular region. Mild amount of subcutaneous emphysema is also seen in the left supraclavicular and left lateral  chest wall regions. Left lung is clear. Decreased right basilar opacity is noted suggesting improving atelectasis or infiltrate. IMPRESSION: Aortic atherosclerosis. Stable position of 2 right-sided chest tubes. Decreased right basilar opacity is noted suggesting improving atelectasis or infiltrate. Mild to moderate right apical pneumothorax is noted which is significantly improved compared to prior exam. Large amount of subcutaneous emphysema is noted on the right, with small amount seen on the left. Electronically Signed   By: Marijo Conception, M.D.   On: 11/06/2016 07:31   Dg Chest Port 1 View  Addendum Date: 11/05/2016   ADDENDUM REPORT: 11/05/2016 10:55 ADDENDUM: The clinician reports a new chest tube was not placed. Two chest tubes remain in place. The tubes were overlapping on the comparison examination from 11/05/2016 at 5:46 a.m. More inferior chest tube tip is at the level the posterior arc of the right fourth rib on this exam. Right pneumothorax is larger than on the comparison study as initially reported. Electronically Signed   By: Inge Rise M.D.   On: 11/05/2016 10:55   Result Date: 11/05/2016 CLINICAL DATA:  Right pneumothorax.  Chest tube placement. EXAM: PORTABLE CHEST 1 VIEW COMPARISON:  Single-view of the chest earlier today. FINDINGS: A second right chest tube has been placed. Extensive subcutaneous emphysema is present over the chest. Right pneumothorax persists and appears larger than on the comparison examination. Apex of the right lung projects over the right fifth rib. The left lung is expanded and clear. Opacity in the right mid and lower lung zones has increased. IMPRESSION: Right pneumothorax appears increased after second chest tube placement as described. Increased right mid and lower lung zone airspace opacity could be due to atelectasis, aspiration or hemorrhage. Electronically Signed: By: Inge Rise M.D. On: 11/05/2016 10:16     Assessment/Plan: S/P Procedure(s)  (LRB): VIDEO BRONCHOSCOPY WITH INSERTION OF INTERBRONCHIAL VALVE (IBV) times three (N/A) Mobilize Continue chest tube for now, air leak less and chest xray improved  Waiting for step down bed  I have seen and examined [name] and agree with the above assessment  and plan.  [name] MD [place] [phone] Office [phone] 11/06/2016 9:41 AM      [name] 11/06/2016 9:40 AM

## 2016-11-07 ENCOUNTER — Inpatient Hospital Stay (HOSPITAL_COMMUNITY): Payer: Medicare Other

## 2016-11-07 DIAGNOSIS — J9601 Acute respiratory failure with hypoxia: Secondary | ICD-10-CM

## 2016-11-07 DIAGNOSIS — J449 Chronic obstructive pulmonary disease, unspecified: Secondary | ICD-10-CM

## 2016-11-07 DIAGNOSIS — R112 Nausea with vomiting, unspecified: Secondary | ICD-10-CM

## 2016-11-07 DIAGNOSIS — R579 Shock, unspecified: Secondary | ICD-10-CM

## 2016-11-07 DIAGNOSIS — I959 Hypotension, unspecified: Secondary | ICD-10-CM

## 2016-11-07 DIAGNOSIS — R0602 Shortness of breath: Secondary | ICD-10-CM

## 2016-11-07 DIAGNOSIS — R000 Tachycardia, unspecified: Secondary | ICD-10-CM

## 2016-11-07 LAB — BASIC METABOLIC PANEL
Anion gap: 11 (ref 5–15)
BUN: 12 mg/dL (ref 6–20)
CO2: 27 mmol/L (ref 22–32)
Calcium: 8.8 mg/dL — ABNORMAL LOW (ref 8.9–10.3)
Chloride: 95 mmol/L — ABNORMAL LOW (ref 101–111)
Creatinine, Ser: 0.79 mg/dL (ref 0.44–1.00)
GFR calc Af Amer: >60 mL/min (ref >60)
GFR calc non Af Amer: >60 mL/min (ref >60)
Glucose, Bld: 120 mg/dL — ABNORMAL HIGH (ref 65–99)
Potassium: 4.5 mmol/L (ref 3.5–5.1)
Sodium: 133 mmol/L — ABNORMAL LOW (ref 135–145)

## 2016-11-07 LAB — CBC
HCT: 36.9 % (ref 36.0–46.0)
Hemoglobin: 12.4 g/dL (ref 12.0–15.0)
MCH: 30.8 pg (ref 26.0–34.0)
MCHC: 33.6 g/dL (ref 30.0–36.0)
MCV: 91.8 fL (ref 78.0–100.0)
Platelets: 282 10*3/uL (ref 150–400)
RBC: 4.02 MIL/uL (ref 3.87–5.11)
RDW: 13.5 % (ref 11.5–15.5)
WBC: 6.8 10*3/uL (ref 4.0–10.5)

## 2016-11-07 LAB — POCT I-STAT 3, ART BLOOD GAS (G3+)
Acid-Base Excess: 3 mmol/L — ABNORMAL HIGH (ref 0.0–2.0)
Bicarbonate: 28.3 mmol/L — ABNORMAL HIGH (ref 20.0–28.0)
O2 Saturation: 100 %
Patient temperature: 99
TCO2: 30 mmol/L (ref 0–100)
pCO2 arterial: 43.9 mmHg (ref 32.0–48.0)
pH, Arterial: 7.418 (ref 7.350–7.450)
pO2, Arterial: 172 mmHg — ABNORMAL HIGH (ref 83.0–108.0)

## 2016-11-07 LAB — URINALYSIS, ROUTINE W REFLEX MICROSCOPIC
Bilirubin Urine: NEGATIVE
Glucose, UA: NEGATIVE mg/dL
Hgb urine dipstick: NEGATIVE
Ketones, ur: NEGATIVE mg/dL
LEUKOCYTES UA: NEGATIVE
Nitrite: NEGATIVE
PROTEIN: NEGATIVE mg/dL
Specific Gravity, Urine: 1.008 (ref 1.005–1.030)
pH: 7 (ref 5.0–8.0)

## 2016-11-07 LAB — LACTIC ACID, PLASMA
LACTIC ACID, VENOUS: 2.1 mmol/L — AB (ref 0.5–1.9)
LACTIC ACID, VENOUS: 3.3 mmol/L — AB (ref 0.5–1.9)
Lactic Acid, Venous: 2 mmol/L (ref 0.5–1.9)

## 2016-11-07 LAB — CULTURE, RESPIRATORY W GRAM STAIN: Culture: NO GROWTH

## 2016-11-07 LAB — CORTISOL: CORTISOL PLASMA: 82.8 ug/dL

## 2016-11-07 LAB — ECHOCARDIOGRAM COMPLETE
HEIGHTINCHES: 63.5 in
WEIGHTICAEL: 2282.2 [oz_av]

## 2016-11-07 LAB — PROCALCITONIN: Procalcitonin: 2.23 ng/mL

## 2016-11-07 LAB — TROPONIN I: Troponin I: <0.03 ng/mL (ref <0.03)

## 2016-11-07 MED ORDER — METOPROLOL TARTRATE 5 MG/5ML IV SOLN
INTRAVENOUS | Status: AC
  Filled 2016-11-07: qty 5

## 2016-11-07 MED ORDER — NOREPINEPHRINE BITARTRATE 1 MG/ML IV SOLN
0.0000 ug/min | INTRAVENOUS | Status: DC
  Administered 2016-11-07: 38 ug/min via INTRAVENOUS
  Administered 2016-11-08: 25 ug/min via INTRAVENOUS
  Administered 2016-11-09: 14 ug/min via INTRAVENOUS
  Filled 2016-11-07 (×4): qty 16

## 2016-11-07 MED ORDER — VANCOMYCIN HCL 500 MG IV SOLR
500.0000 mg | Freq: Two times a day (BID) | INTRAVENOUS | Status: DC
  Administered 2016-11-08: 500 mg via INTRAVENOUS
  Filled 2016-11-07 (×2): qty 500

## 2016-11-07 MED ORDER — IPRATROPIUM BROMIDE 0.02 % IN SOLN
0.5000 mg | Freq: Four times a day (QID) | RESPIRATORY_TRACT | Status: DC
  Administered 2016-11-07 – 2016-11-30 (×93): 0.5 mg via RESPIRATORY_TRACT
  Filled 2016-11-07 (×93): qty 2.5

## 2016-11-07 MED ORDER — PROMETHAZINE HCL 25 MG/ML IJ SOLN
12.5000 mg | Freq: Once | INTRAMUSCULAR | Status: AC
  Administered 2016-11-07: 12.5 mg via INTRAVENOUS
  Filled 2016-11-07: qty 1

## 2016-11-07 MED ORDER — HEPARIN (PORCINE) IN NACL 100-0.45 UNIT/ML-% IJ SOLN
1300.0000 [IU]/h | INTRAMUSCULAR | Status: DC
  Administered 2016-11-07: 1100 [IU]/h via INTRAVENOUS
  Administered 2016-11-08 – 2016-11-11 (×5): 1300 [IU]/h via INTRAVENOUS
  Filled 2016-11-07 (×5): qty 250

## 2016-11-07 MED ORDER — METOPROLOL TARTRATE 5 MG/5ML IV SOLN
5.0000 mg | Freq: Once | INTRAVENOUS | Status: AC
  Administered 2016-11-07: 5 mg via INTRAVENOUS

## 2016-11-07 MED ORDER — SODIUM CHLORIDE 0.9 % IV BOLUS (SEPSIS)
250.0000 mL | Freq: Once | INTRAVENOUS | Status: AC
  Administered 2016-11-07: 250 mL via INTRAVENOUS

## 2016-11-07 MED ORDER — DEXTROSE-NACL 5-0.45 % IV SOLN
INTRAVENOUS | Status: DC
  Administered 2016-11-07: 100 mL/h via INTRAVENOUS
  Administered 2016-11-08: 10:00:00 via INTRAVENOUS
  Administered 2016-11-08: 100 mL/h via INTRAVENOUS
  Administered 2016-11-09: 07:00:00 via INTRAVENOUS

## 2016-11-07 MED ORDER — ACETAMINOPHEN 650 MG RE SUPP
650.0000 mg | RECTAL | Status: DC | PRN
  Administered 2016-11-07: 650 mg via RECTAL
  Filled 2016-11-07: qty 1

## 2016-11-07 MED ORDER — SODIUM CHLORIDE 0.9% FLUSH
10.0000 mL | Freq: Two times a day (BID) | INTRAVENOUS | Status: DC
  Administered 2016-11-07 – 2016-11-09 (×3): 10 mL

## 2016-11-07 MED ORDER — PIPERACILLIN-TAZOBACTAM 3.375 G IVPB
3.3750 g | Freq: Three times a day (TID) | INTRAVENOUS | Status: DC
  Administered 2016-11-07 – 2016-11-09 (×5): 3.375 g via INTRAVENOUS
  Filled 2016-11-07 (×6): qty 50

## 2016-11-07 MED ORDER — PIPERACILLIN-TAZOBACTAM 3.375 G IVPB 30 MIN
3.3750 g | Freq: Once | INTRAVENOUS | Status: AC
  Administered 2016-11-07: 3.375 g via INTRAVENOUS
  Filled 2016-11-07: qty 50

## 2016-11-07 MED ORDER — ALBUMIN HUMAN 25 % IV SOLN
12.5000 g | Freq: Once | INTRAVENOUS | Status: AC
  Administered 2016-11-07: 12.5 g via INTRAVENOUS
  Filled 2016-11-07: qty 50

## 2016-11-07 MED ORDER — SODIUM CHLORIDE 0.9% FLUSH: 10.0000 mL | INTRAVENOUS | Status: DC | PRN

## 2016-11-07 MED ORDER — IPRATROPIUM BROMIDE 0.02 % IN SOLN
RESPIRATORY_TRACT | Status: AC
  Administered 2016-11-07: 09:00:00
  Filled 2016-11-07: qty 2.5

## 2016-11-07 MED ORDER — NOREPINEPHRINE BITARTRATE 1 MG/ML IV SOLN
0.0000 ug/min | INTRAVENOUS | Status: DC
  Administered 2016-11-07: 35 ug/min via INTRAVENOUS
  Administered 2016-11-07: 40 ug/min via INTRAVENOUS
  Administered 2016-11-07: 45 ug/min via INTRAVENOUS
  Administered 2016-11-07: 5 ug/min via INTRAVENOUS
  Filled 2016-11-07 (×6): qty 4

## 2016-11-07 MED ORDER — LEVALBUTEROL HCL 0.63 MG/3ML IN NEBU
0.6300 mg | INHALATION_SOLUTION | Freq: Four times a day (QID) | RESPIRATORY_TRACT | Status: DC
  Administered 2016-11-07 – 2016-11-30 (×93): 0.63 mg via RESPIRATORY_TRACT
  Filled 2016-11-07 (×94): qty 3

## 2016-11-07 MED ORDER — SODIUM CHLORIDE 0.9 % IV SOLN
0.0000 ug/min | INTRAVENOUS | Status: DC
  Administered 2016-11-07: 200 ug/min via INTRAVENOUS
  Administered 2016-11-07: 75 ug/min via INTRAVENOUS
  Filled 2016-11-07 (×3): qty 8

## 2016-11-07 MED ORDER — PERFLUTREN LIPID MICROSPHERE
1.0000 mL | INTRAVENOUS | Status: AC | PRN
  Administered 2016-11-07: 3 mL via INTRAVENOUS
  Filled 2016-11-07: qty 10

## 2016-11-07 MED ORDER — VANCOMYCIN HCL 10 G IV SOLR
1250.0000 mg | Freq: Once | INTRAVENOUS | Status: AC
  Administered 2016-11-07: 1250 mg via INTRAVENOUS
  Filled 2016-11-07: qty 1250

## 2016-11-07 MED ORDER — VASOPRESSIN 20 UNIT/ML IV SOLN
0.0400 [IU]/min | INTRAVENOUS | Status: DC
  Administered 2016-11-07: 0.03 [IU]/min via INTRAVENOUS
  Administered 2016-11-08 – 2016-11-10 (×3): 0.04 [IU]/min via INTRAVENOUS
  Filled 2016-11-07 (×4): qty 2

## 2016-11-07 MED ORDER — PHENYLEPHRINE HCL 10 MG/ML IJ SOLN
0.0000 ug/min | INTRAMUSCULAR | Status: DC
  Administered 2016-11-07: 50 ug/min via INTRAVENOUS
  Filled 2016-11-07: qty 1

## 2016-11-07 NOTE — Consult Note (Signed)
PULMONARY / CRITICAL CARE MEDICINE   Name: [name] MRN: [id number] DOB: April 24, 1946    ADMISSION DATE:  10/29/2016 CONSULTATION DATE:  11/07/2016  REFERRING MD:  Lanelle Bal, M.D. / CVTS  CHIEF COMPLAINT:  Acute Hypoxic Respiratory Failure  HISTORY OF PRESENT ILLNESS:  71 y.o. 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 Patient did undergo endobronchial valve replacement on 1/22 for her persistent air leak. Has previously been ambulatory in the ICU and out of bed without difficulty. Patient has been receiving oxycodone for pain associated with her right chest tubes and postoperative pain. Patient did receive Phenergan this morning for nausea and did also have subsequent emesis. She had worsening hypoxia and respiratory status prompting initiation of BiPAP support and PCCM consultation.  PAST MEDICAL HISTORY :  Past Medical History:  Diagnosis Date  . Arthritis   . Asthma    asthmatic bronchitis  . COPD (chronic obstructive pulmonary disease) ([place])   . Dyspnea   . History of kidney stones   . Hyperlipidemia   . Hypertension   . Pneumonia   . PONV (postoperative nausea and vomiting)    none with her hip surgery  . Restless leg syndrome   . 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 ABDOMINAL HYSTERECTOMY    . BONE DENSIDEXAY  06/02/2013  . CARDIAC CATHETERIZATION    . COLONOSCOPY  08/05/2015  . CYST EXCISION     from back  . CYSTO    . DENTAL EXAMINATION UNDER ANESTHESIA    . DIAGNOSTIC MAMMOGRAM  07/28/2016  . EYE SURGERY Left    cataract surgery with lens implant  . LYMPH NODE DISSECTION Right 10/29/2016   Procedure: LYMPH NODE DISSECTION;  Surgeon: [name], MD;  Location: [facility];  Service: Thoracic;  Laterality: Right;  . TONSILLECTOMY    . TOTAL HIP ARTHROPLASTY  10/31/2012   Procedure: TOTAL HIP ARTHROPLASTY ANTERIOR APPROACH;  Surgeon: Mcarthur Rossetti, MD;  Location: [facility];  Service: Orthopedics;  Laterality: Right;  Right Total Hip Replacement  . VIDEO ASSISTED THORACOSCOPY (VATS)/WEDGE RESECTION Right 10/29/2016   Procedure: RIGHT  VIDEO ASSISTED THORACOSCOPY /RIGHT UPPER LOBE LUNG RESECTION and Right Thoracotomy;  Surgeon: [name], MD;  Location: [facility];  Service: Thoracic;  Laterality: Right;  [name] N/A 10/29/2016   Procedure: VIDEO BRONCHOSCOPY;  Surgeon: [name], MD;  Location: [facility];  Service: Thoracic;  Laterality: N/A;  . VIDEO BRONCHOSCOPY WITH ENDOBRONCHIAL NAVIGATION N/A 08/29/2016   Procedure: VIDEO BRONCHOSCOPY WITH ENDOBRONCHIAL NAVIGATION;  Surgeon: Collene Gobble, MD;  Location: [facility];  Service: Thoracic;  Laterality: N/A;  . VIDEO BRONCHOSCOPY WITH INSERTION OF INTERBRONCHIAL VALVE (IBV) N/A 11/05/2016   Procedure: VIDEO BRONCHOSCOPY WITH INSERTION OF INTERBRONCHIAL VALVE (IBV) times three;  Surgeon: [name], MD;  Location: [facility];  Service: Thoracic;  Laterality: N/A;    Allergies  Allergen Reactions  . Nickel Rash    No current facility-administered medications on file prior to encounter.    Current Outpatient Prescriptions on File Prior to Encounter  Medication Sig  . simvastatin (ZOCOR) 20 MG tablet Take 20 mg by mouth every morning.  . Tiotropium Bromide-Olodaterol (STIOLTO RESPIMAT) 2.5-2.5 MCG/ACT AERS Inhale 2 puffs into the lungs daily.  [name] albuterol (PROVENTIL HFA;VENTOLIN HFA) 108 (90 BASE) MCG/ACT inhaler Inhale 2 puffs  into the lungs every 6 (six) hours as needed for wheezing or shortness of breath. For shortness of breath    FAMILY HISTORY:  Unable to obtain with BiPAP mask in place.  SOCIAL HISTORY: Social History   Social History  . Marital status: Married    Spouse name: N/A  . Number of children: N/A  . Years of education: N/A   Social History Main Topics  . Smoking  status: Former Smoker    Packs/day: 0.00    Quit date: 08/29/2016  . Smokeless tobacco: Never Used  . Alcohol use No  . Drug use: No  . Sexual activity: Not Asked   Other Topics Concern  . None   Social History Narrative  . None    REVIEW OF SYSTEMS:  Unable to obtain given BiPAP mask.  SUBJECTIVE: As above.  VITAL SIGNS: BP 123/73   Pulse (!) 134   Temp 99 F (37.2 C) (Oral)   Resp (!) 35   Ht 5' 3.5" (1.613 m)   Wt 142 lb 10.2 oz (64.7 kg)   SpO2 92%   BMI 24.87 kg/m   HEMODYNAMICS:    VENTILATOR SETTINGS:    INTAKE / OUTPUT: I/O last 3 completed shifts: In: 8469 [P.O.:1200; I.V.:20] Out: 525 [Urine:125; Chest Tube:400]  PHYSICAL EXAMINATION: General:  Mild distress. No family at bedside. Surgeon, respiratory therapist, and nursing staff at bedside. Integument:  Warm & dry. No rash on exposed skin. No bruising. Extremities:  No cyanosis or clubbing.  Lymphatics:  No appreciated cervical or supraclavicular lymphadenoapthy. HEENT:  No scleral injection or icterus. BiPAP mask in place. Cardiovascular:  Tachycardic. Sinus rhythm on telemetry. No edema. No appreciable JVD given body positioning.  Pulmonary:  Prolonged exhalation bilaterally with mild wheeze. Moderately increased work of breathing on BiPAP support. Abdomen: Soft. Normal bowel sounds. Nondistended.  Musculoskeletal:  Normal bulk and tone. No joint deformity or effusion appreciated. Neurological:  Moving all 4 extremities equally. No meningismus. Patient following commands. Psychiatric:  Unable to assess with BiPAP mask in place.  LABS: CBC Latest Ref Rng & Units 11/05/2016 11/02/2016 11/01/2016  WBC 4.0 - 10.5 K/uL 8.4 9.7 13.6(H)  Hemoglobin 12.0 - 15.0 g/dL 10.2(L) 11.5(L) 11.6(L)  Hematocrit 36.0 - 46.0 % 30.7(L) 33.9(L) 35.2(L)  Platelets 150 - 400 K/uL 247 205 187   BMP Latest Ref Rng & Units 11/05/2016 11/02/2016 11/01/2016  Glucose 65 - 99 mg/dL 114(H) 111(H) 110(H)  BUN 6 - 20 mg/dL '10 11  8  '$ Creatinine 0.44 - 1.00 mg/dL 0.61 0.64 0.58  Sodium 135 - 145 mmol/L 134(L) 134(L) 134(L)  Potassium 3.5 - 5.1 mmol/L 3.7 3.8 3.5  Chloride 101 - 111 mmol/L 98(L) 97(L) 97(L)  CO2 22 - 32 mmol/L '28 27 29  '$ Calcium 8.9 - 10.3 mg/dL 8.6(L) 8.5(L) 8.3(L)    STUDIES:  PFT 08/24/16: FVC 2.54 L (89%) FEV1 1.22 L (57%) FEV1/FVC 0.48 FEF 25-75 0.44 L (24%) negative bronchial dilator response TLC 5.19 L (120%) RV 155% ERV 76% DLCO uncorrected 52%  PORT CXR 1/24:  Personally reviewed by me. No new left opacity. No appreciated pleural effusion. Right basilar opacity relatively unchanged but need be slightly worsened. Right-sided chest tubes remain in place. He  MICROBIOLOGY: MRSA PCR 1/11:  Negative Right Bronchial Washing 1/22 >>  ANTIBIOTICS: None  SIGNIFICANT EVENTS: 1/15 - Admit w/ RUL Lobectomy 1/22 - Bronchoscopy with 3 right-sided endobronchial valves for persistent air leak  LINES/TUBES: Right Chest Tubes 1/15 >> PIV  ASSESSMENT / PLAN:  71 y.o. female admitted for right upper lobectomy completed on 1/15. Patient had subsequent persistent air leak. This morning she had acute decompensation of her respiratory status with worsening hypoxia and tachypnea as well as sinus tachycardia. Patient currently is normotensive. She is awake and following commands. ABG and chest x-ray imaging reviewed by me and Dr. Servando Snare at bedside.  1. Acute Hypoxic Respiratory Failure:  Continuing BiPAP support and weaning FiO2 for Sat >90%. Low threshold for intubation for any signs of clinical worsening. 2. Moderate-Severe COPD:  Discontinuing Brovana & Spiriva. Switching Xopenex nebs to q6hr scheduled & starting Atrovent nebs q6hr scheduled. Holding off on steroid therapy at this time. 3. Sinus Tachycardia:  Checking Troponin I q6hr x3. Likely stress response. 4. Nausea w/ Emesis:  Checking BMP, CBC, Lactate, Procalcitonin, Blood cultures, U/A, & Urine Culture. Holding off on further phenergan with  resolution of symptoms. 5. Persistent Right Pneumothorax & Air Leak:  Secondary to lobectomy. Chest tubes in place & endobronchial valves placed 1/22. Management per CVTS. 6. Essential Hypertension:  Currently normotensive. Holding off on oral medications at this time.  7. Hyperlipidemia:  Continuing Zocor qhs if able to tolerate PO medications. 8. Diet:  NPO for now pending possible intubation. 9. Prophylaxis:  SCDs & Lovenox.   I have spent a total of 34 minutes of critical care time today caring for the patient, discussing the plan of care with Dr. Servando Snare at bedside, and reviewing the patient's electronic medical record.   Sonia Baller Ashok Cordia, M.D. [facility] Pulmonary & Critical Care Pager:  [phone] After 3pm or if no response, call [phone] 11/07/2016, 8:23 AM

## 2016-11-07 NOTE — Progress Notes (Signed)
eLink Physician-Brief Progress Note Patient Name: [name] DOB: Apr 25, 1946 MRN: [id number]   Date of Service  11/07/2016  HPI/Events of Note  Patient seen earlier as a PC CM consult for respiratory distress. As well as hypotension. She was on BiPAP earlier.   Patient was seen, comfortable, in mild distress. She feels better than this morning. No subjective complaints. Blood pressure 99/50, map 66, heart rate 108, respiratory rate 18, sats 95% on nonrebreather mask.   Patient is still on levo fed a 35 mcg/kg/m.  She is also on Neo-Synephrine at 150 mcg/kg/m.  Nurse has difficulty titrating down pressors.   2-D echo was just done.  They have not done dopplers yet.  Cortisol still pending.   eICU Interventions  Continue nonrebreather mask for now. Maybe we can switch to nasal cannula high flow later.   Nurse was instructed to start vasopressin.   I told nurse to follow-up on the Dopplers.  Awaiting an 2-D echo, cortisol level.   Continuing to follow up.      Intervention Category Major Interventions: Other:;Respiratory failure - evaluation and management  Rush Landmark 11/07/2016, 4:17 PM

## 2016-11-07 NOTE — Progress Notes (Signed)
PCCM Attending Note: Patient reassessed at bedside. Respiratory status improving and weaned to nasal cannula oxygen. Patient reporting a generalized malaise. No specific pain. No nausea or abdominal pain. Transitioned to Levophed for vasopressor support. Patient now febrile. Checking serum cortisol level. Discontinuing CTA Chest. Checking stat venous duplex bilateral lower extremities and complete echocardiogram. CVL being repositioned at this time.  Sonia Baller Ashok Cordia, M.D. [facility] Pulmonary & Critical Care Pager:  [phone] After 3pm or if no response, call [phone] 2:27 PM 11/07/16

## 2016-11-07 NOTE — Progress Notes (Signed)
Patient ID: NANSI BIRMINGHAM, female   DOB: March 30, 1946, 71 y.o.   MRN: [id number] TCTS DAILY ICU PROGRESS NOTE                   [address]            [place] [address]          [phone]   2 Days Post-Op Procedure(s) (LRB): VIDEO BRONCHOSCOPY WITH INSERTION OF INTERBRONCHIAL VALVE (IBV) times three (N/A) Day Post-Op Procedure(s) (LRB): VIDEO BRONCHOSCOPY WITH INSERTION OF INTERBRONCHIAL VALVE (IBV) times three (N/A) Day 8: Post-Op Procedure(s) (LRB): VIDEO BRONCHOSCOPY (N/A) RIGHT VIDEO ASSISTED THORACOSCOPY /RIGHT UPPER LOBE LUNG RESECTION and Right Thoracotomy (Right) LYMPH NODE DISSECTION (Right  Total Length of Stay:  LOS: 9 days   Subjective: More lethargic this am, about 7 am, felt nauseated  , vomited, increased hr . Had been taking diet without and gi complaints   Objective: Vital signs in last 24 hours: Temp:  [98.3 F (36.8 C)-99 F (37.2 C)] 99 F (37.2 C) (01/24 0600) Pulse Rate:  [75-138] 134 (01/24 0800) Cardiac Rhythm: Sinus tachycardia (01/24 0600) Resp:  [11-35] 35 (01/24 0800) BP: (87-133)/(37-78) 123/73 (01/24 0800) SpO2:  [85 %-100 %] 92 % (01/24 0800)  Filed Weights   10/31/16 0500 11/01/16 0700 11/06/16 0400  Weight: 143 lb 4.8 oz (65 kg) 140 lb 6.9 oz (63.7 kg) 142 lb 10.2 oz (64.7 kg)    Weight change:    Hemodynamic parameters for last 24 hours:    Intake/Output from previous day: 01/23 0701 - 01/24 0700 In: 1220 [P.O.:1200; I.V.:20] Out: 400 [Chest Tube:400]  Intake/Output this shift: No intake/output data recorded.  Current Meds: Scheduled Meds: . arformoterol  15 mcg Nebulization BID  . bisacodyl  10 mg Oral Daily  . enoxaparin (LOVENOX) injection  40 mg Subcutaneous Q24H  . guaiFENesin  600 mg Oral BID  . levalbuterol  0.63 mg Nebulization BID  . mouth rinse  15 mL Mouth Rinse BID  . metoprolol      . senna-docusate  1 tablet Oral QHS  . simvastatin  20 mg Oral QHS  . sodium chloride flush  10-40 mL  Intracatheter Q12H  . tiotropium  18 mcg Inhalation Daily   Continuous Infusions: . sodium chloride 10 mL/hr at 11/03/16 0400   PRN Meds:.albuterol, oxyCODONE, potassium chloride (KCL MULTIRUN) 30 mEq in 265 mL IVPB, sodium chloride flush, traMADol  General appearance: distracted, fatigued and severe distress Neurologic: intact Heart: regular rate and rhythm, S1, S2 normal, no murmur, click, rub or gallop Lungs: diminished breath sounds RLL and RML Abdomen: soft, non-tender; bowel sounds normal; no masses,  no organomegaly Extremities: extremities normal, atraumatic, no cyanosis or edema and Homans sign is negative, no sign of DVT Wound: still with air leak, ct on suction no kinks   Lab Results: CBC:  Recent Labs  11/05/16 0247  WBC 8.4  HGB 10.2*  HCT 30.7*  PLT 247   BMET:   Recent Labs  11/05/16 0247  NA 134*  K 3.7  CL 98*  CO2 28  GLUCOSE 114*  BUN 10  CREATININE 0.61  CALCIUM 8.6*    CMET: Lab Results  Component Value Date   WBC 8.4 11/05/2016   HGB 10.2 (L) 11/05/2016   HCT 30.7 (L) 11/05/2016   PLT 247 11/05/2016   GLUCOSE 114 (H) 11/05/2016   ALT 20 10/31/2016   AST 31 10/31/2016   NA 134 (L) 11/05/2016  K 3.7 11/05/2016   CL 98 (L) 11/05/2016   CREATININE 0.61 11/05/2016   BUN 10 11/05/2016   CO2 28 11/05/2016   INR 0.99 10/25/2016   Recent Results (from the past 240 hour(s))  Culture, respiratory (NON-Expectorated)     Status: None (Preliminary result)   Collection Time: 11/05/16  8:53 AM  Result Value Ref Range Status   Specimen Description BRONCHIAL WASHINGS RIGHT  Final   Special Requests NONE  Final   Gram Stain   Final    RARE WBC PRESENT, PREDOMINANTLY MONONUCLEAR NO ORGANISMS SEEN    Culture NO GROWTH < 24 HOURS  Final   Report Status PENDING  Incomplete     PT/INR: No results for input(s): LABPROT, INR in the last 72 hours. Radiology: No results found.   Assessment/Plan: Acute respiratory failure, with tachycardia,  check lactate, check abg, and acid basis status   Increased fio2,  Have involved criticial care /pulmonary to assisted in resuscitation  , poss entubation Cultures from bronch two days ago  have not grown any thing so far  [name] 11/07/2016 8:22 AM

## 2016-11-07 NOTE — Progress Notes (Signed)
Dr. Servando Snare paged and at bedside. Pt. On non-rebreather report given to Cassell Smiles, RN.

## 2016-11-07 NOTE — Progress Notes (Signed)
Pt. HR remaining 120-130s with nausea since ambulating to bathroom this AM. No PRN orders available. Dr. Roxy Manns contacted, given verbal order for Lopressor 5 mg IV once and Phenergan 12.5 IV once. Will continue to monitor.

## 2016-11-07 NOTE — Progress Notes (Signed)
CRITICAL VALUE ALERT  Critical value received:  Lactic = 2.0  Date of notification:  11/07/16  Time of notification:  9:10 AM  Critical value read back:Yes.    Nurse who received alert:  Cassell Smiles, RN  MD notified (1st page):  Dr. Ashok Cordia  Time of first page:  9:10 AM  MD notified (2nd page):  Time of second page:  Responding MD:  Dr. Ashok Cordia  Time MD responded:  9:10 AM

## 2016-11-07 NOTE — Progress Notes (Signed)
eLink Physician-Brief Progress Note Patient Name: [name] DOB: September 25, 1946 MRN: [id number]   Date of Service  11/07/2016  HPI/Events of Note  Febrile. NPO. Already on abx.   eICU Interventions  Tylenol suppository     Intervention Category Intermediate Interventions: Other:  Rush Landmark 11/07/2016, 8:38 PM

## 2016-11-07 NOTE — Progress Notes (Signed)
      [address]       , [address]             [phone]      Rapid clinical deterioration today  Currently on 100% NRB, temp to 102.8  BP 131/63   Pulse 99   Temp (!) 102.8 F (39.3 C) (Oral)   Resp (!) 29   Ht 5' 3.5" (1.613 m)   Wt 142 lb 10.2 oz (64.7 kg)   SpO2 97%   BMI 24.87 kg/m  On phenylephrine + norepinephrine drips to support BP  Echo- showed preserved LV function- limited study  Duplex being done now  Most likely sepsis- started on vancomycin and Zosyn earlier  [place]on C. Roxan Hockey, MD [facility] [phone]

## 2016-11-07 NOTE — Progress Notes (Signed)
      [address]       [place] [address]             [phone]      Duplex showed bilateral peroneal DVT. Doubt that is source of acute deterioration, sepsis far more likely but will start heparin drip to prevent propagation.  Revonda Standard Roxan Hockey, MD [facility] [phone]

## 2016-11-07 NOTE — Progress Notes (Signed)
PCCM Attending Note: Results reviewed. Normal renal function, no anemia, and no leukocytosis. Doubt infection but procalcitonin is elevated. Worsening hypotension & CVL was placed for vasopressor support. Husband and brother updated by Dr. Ashok Cordia this morning. Explained her critical status. Ordering stat CTA chest to rule out PE & starting empiric Vancomycin & Zosyn.   Sonia Baller Ashok Cordia, M.D. [facility] Pulmonary & Critical Care Pager:  [phone] After 3pm or if no response, call [phone] 11:37 AM 11/07/16

## 2016-11-07 NOTE — Progress Notes (Signed)
eLink Physician-Brief Progress Note Patient Name: [name] DOB: 1946-04-20 MRN: [id number]   Date of Service  11/07/2016  HPI/Events of Note  Per RN, Dopplers were positive for DVT.   I spoke with RN Ander Purpura.  [facility] aware. Dr. Roxan Hockey is placing orders for heparin.   eICU Interventions  Will follow     Intervention Category Evaluation Type: Other  [place] 11/07/2016, 5:11 PM

## 2016-11-07 NOTE — Progress Notes (Signed)
  Echocardiogram 2D Echocardiogram has been performed.  Darlina Sicilian M 11/07/2016, 4:06 PM

## 2016-11-07 NOTE — Progress Notes (Signed)
RT note-Patient became nauseated and Bibpap removed, placed on NRB 100%

## 2016-11-07 NOTE — Progress Notes (Signed)
CRITICAL VALUE ALERT  Critical value received:  Bilat venous dopplers  Date of notification:  11/07/16  Time of notification:  5:02 PM   Critical value read back:Yes.    Nurse who received alert:  Cassell Smiles, RN  MD notified (1st page):  Dr. Corrie Dandy  Time of first page:  5:03 PM   MD notified (2nd page):  Time of second page:  Responding MD:  Dr. Corrie Dandy  Time MD responded:  5:03 PM

## 2016-11-07 NOTE — Progress Notes (Signed)
ANTICOAGULATION CONSULT NOTE  Pharmacy Consult for heparin Indication: DVT  Heparin Dosing Weight: 64.7 kg   Assessment: 16 yof with new bilateral peroneal DVT on dopplers. Pharmacy consulted to start heparin with no bolus. Not on anticoagulation PTA but currently on Lovenox prophylaxis, last dose received at 0950 this AM. CBC wnl, no bleed documented.  Goal of Therapy:  Heparin level 0.3-0.7 units/ml Monitor platelets by anticoagulation protocol: Yes   Plan:  D/c Lovenox prophylaxis Start heparin at 1100 units/h (no bolus) 6h heparin level Daily heparin level/CBC Monitor for s/sx bleeding   Elicia Lamp, PharmD, BCPS Clinical Pharmacist 11/07/2016 5:20 PM

## 2016-11-07 NOTE — Progress Notes (Signed)
**  Preliminary report by tech**  Bilateral lower extremity venous duplex complete. There is evidence of acute deep vein thrombosis involving the peroneal veins of the right and left lower extremities. There is no evidence of superficial vein thrombosis involving the right and left lower extremities. There is no evidence of a Baker's cyst bilaterally. Results were given to the patient's nurse, Lauren.  11/07/16 5:12 PM [name] RVT

## 2016-11-07 NOTE — Progress Notes (Signed)
[place] Progress Note Patient Name: [name] DOB: 04/15/1946 MRN: [id number]   Date of Service  11/07/2016  HPI/Events of Note  RN calls for hypotension.   CVP was 0. She got an amp of albumin. CVP now is 4.   On levo fed at 45 mcg/kg/m. Neo-Synephrine was just restarted. Currently at 27 mcg/kg/m. Patient on vasopressin.   Blood pressure was 107/58, heart rate 108.   eICU Interventions  Start maintenance fluids. D5 1/2 NS Will re bolus if BP drops later.  May need another amp of albumin.      Intervention Category Major Interventions: Hypotension - evaluation and management  Rush Landmark 11/07/2016, 7:16 PM

## 2016-11-07 NOTE — Progress Notes (Signed)
Pharmacy Antibiotic Note  [name] is a 71 y.o. female  with possible sepsis to start empiric vancomycin/zosyn..   -WBC= 6.8, tmax= 100.7, PCT= 2.23, CrCl ~ 60  Plan: -Vancomycin '1250mg'$  IV x1 followed by '500mg'$  IV q12h -Zosyn 3.35g IV q8h -Will follow renal function, cultures and clinical progress    Height: 5' 3.5" (161.3 cm) Weight: 142 lb 10.2 oz (64.7 kg) IBW/kg (Calculated) : 53.55  Temp (24hrs), Avg:99.2 F (37.3 C), Min:98.3 F (36.8 C), Max:100.7 F (38.2 C)   Recent Labs Lab 11/01/16 0415 11/02/16 0359 11/05/16 0247 11/07/16 0802 11/07/16 0810  WBC 13.6* 9.7 8.4  --  6.8  CREATININE 0.58 0.64 0.61  --  0.79  LATICACIDVEN  --   --   --  2.0*  --     Estimated Creatinine Clearance: 59.1 mL/min (by C-G formula based on SCr of 0.79 mg/dL).    Allergies  Allergen Reactions  . Nickel Rash    Antimicrobials this admission: 1/24 vanc 1/24 zosyn  Dose adjustments this admission:   Microbiology results: 1/24 urine 1/24 blood x2 1/22 resp- nge  Thank you for allowing pharmacy to be a part of this patient's care.  Hildred Laser, Pharm D 11/07/2016 11:44 AM

## 2016-11-07 NOTE — Procedures (Signed)
Central Venous Catheter Insertion Procedure Note [name] [id number] Mar 26, 1946  Procedure: Insertion of Central Venous Catheter Indications: Assessment of intravascular volume and Drug and/or fluid administration  Procedure Details Consent: Risks of procedure as well as the alternatives and risks of each were explained to the (patient/caregiver).  Consent for procedure obtained. Time Out: Verified patient identification, verified procedure, site/side was marked, verified correct patient position, special equipment/implants available, medications/allergies/relevent history reviewed, required imaging and test results available.  Performed  Maximum sterile technique was used including antiseptics, cap, gloves, gown, hand hygiene, mask and sheet. Skin prep: Chlorhexidine; local anesthetic administered A antimicrobial bonded/coated triple lumen catheter was placed in the right internal jugular vein using the Seldinger technique.  Evaluation Blood flow good Complications: No apparent complications Patient did tolerate procedure well. Chest X-ray ordered to verify placement.  CXR: pending.   Performed using ultrasound guidance.  Wire visualized in vessel under ultrasound.   Hannah Madrid, NP 11/07/2016  11:06 AM

## 2016-11-08 ENCOUNTER — Inpatient Hospital Stay (HOSPITAL_COMMUNITY): Payer: Medicare Other

## 2016-11-08 DIAGNOSIS — Z902 Acquired absence of lung [part of]: Secondary | ICD-10-CM

## 2016-11-08 DIAGNOSIS — R6521 Severe sepsis with septic shock: Secondary | ICD-10-CM

## 2016-11-08 DIAGNOSIS — N179 Acute kidney failure, unspecified: Secondary | ICD-10-CM

## 2016-11-08 DIAGNOSIS — A419 Sepsis, unspecified organism: Secondary | ICD-10-CM

## 2016-11-08 LAB — CBC WITH DIFFERENTIAL/PLATELET
Basophils Absolute: 0 10*3/uL (ref 0.0–0.1)
Basophils Relative: 0 %
Eosinophils Absolute: 0.4 10*3/uL (ref 0.0–0.7)
Eosinophils Relative: 1 %
HCT: 35.6 % — ABNORMAL LOW (ref 36.0–46.0)
Hemoglobin: 12.5 g/dL (ref 12.0–15.0)
Lymphocytes Relative: 5 %
Lymphs Abs: 2.2 10*3/uL (ref 0.7–4.0)
MCH: 31.6 pg (ref 26.0–34.0)
MCHC: 35.1 g/dL (ref 30.0–36.0)
MCV: 89.9 fL (ref 78.0–100.0)
Monocytes Absolute: 3.1 10*3/uL — ABNORMAL HIGH (ref 0.1–1.0)
Monocytes Relative: 7 %
Neutro Abs: 39.1 10*3/uL — ABNORMAL HIGH (ref 1.7–7.7)
Neutrophils Relative %: 87 %
Platelets: 203 10*3/uL (ref 150–400)
RBC: 3.96 MIL/uL (ref 3.87–5.11)
RDW: 13.5 % (ref 11.5–15.5)
WBC Morphology: INCREASED
WBC: 44.8 10*3/uL — ABNORMAL HIGH (ref 4.0–10.5)

## 2016-11-08 LAB — RENAL FUNCTION PANEL
ANION GAP: 16 — AB (ref 5–15)
Albumin: 2 g/dL — ABNORMAL LOW (ref 3.5–5.0)
BUN: 26 mg/dL — ABNORMAL HIGH (ref 6–20)
CO2: 19 mmol/L — ABNORMAL LOW (ref 22–32)
CREATININE: 1.75 mg/dL — AB (ref 0.44–1.00)
Calcium: 7.4 mg/dL — ABNORMAL LOW (ref 8.9–10.3)
Chloride: 94 mmol/L — ABNORMAL LOW (ref 101–111)
GFR, EST AFRICAN AMERICAN: 33 mL/min — AB (ref >60)
GFR, EST NON AFRICAN AMERICAN: 28 mL/min — AB (ref >60)
Glucose, Bld: 199 mg/dL — ABNORMAL HIGH (ref 65–99)
Phosphorus: 1.9 mg/dL — ABNORMAL LOW (ref 2.5–4.6)
Potassium: 3.8 mmol/L (ref 3.5–5.1)
Sodium: 129 mmol/L — ABNORMAL LOW (ref 135–145)

## 2016-11-08 LAB — MAGNESIUM: MAGNESIUM: 1.1 mg/dL — AB (ref 1.7–2.4)

## 2016-11-08 LAB — CBC
HCT: 33.4 % — ABNORMAL LOW (ref 36.0–46.0)
HEMOGLOBIN: 11.5 g/dL — AB (ref 12.0–15.0)
MCH: 30.8 pg (ref 26.0–34.0)
MCHC: 34.4 g/dL (ref 30.0–36.0)
MCV: 89.5 fL (ref 78.0–100.0)
Platelets: 166 10*3/uL (ref 150–400)
RBC: 3.73 MIL/uL — AB (ref 3.87–5.11)
RDW: 13.6 % (ref 11.5–15.5)
WBC: 40.3 10*3/uL — ABNORMAL HIGH (ref 4.0–10.5)

## 2016-11-08 LAB — HEPARIN LEVEL (UNFRACTIONATED)
Heparin Unfractionated: 0.13 IU/mL — ABNORMAL LOW (ref 0.30–0.70)
Heparin Unfractionated: 0.41 IU/mL (ref 0.30–0.70)
Heparin Unfractionated: 0.32 IU/mL (ref 0.30–0.70)

## 2016-11-08 LAB — LACTIC ACID, PLASMA
Lactic Acid, Venous: 4.5 mmol/L (ref 0.5–1.9)
Lactic Acid, Venous: 2.7 mmol/L (ref 0.5–1.9)
Lactic Acid, Venous: 2.1 mmol/L (ref 0.5–1.9)

## 2016-11-08 LAB — URINE CULTURE
Culture: NO GROWTH
SPECIAL REQUESTS: NORMAL

## 2016-11-08 LAB — BASIC METABOLIC PANEL
Anion gap: 13 (ref 5–15)
BUN: 32 mg/dL — AB (ref 6–20)
CHLORIDE: 92 mmol/L — AB (ref 101–111)
CO2: 19 mmol/L — ABNORMAL LOW (ref 22–32)
Calcium: 6.6 mg/dL — ABNORMAL LOW (ref 8.9–10.3)
Creatinine, Ser: 1.99 mg/dL — ABNORMAL HIGH (ref 0.44–1.00)
GFR calc Af Amer: 28 mL/min — ABNORMAL LOW (ref >60)
GFR calc non Af Amer: 24 mL/min — ABNORMAL LOW (ref >60)
GLUCOSE: 207 mg/dL — AB (ref 65–99)
POTASSIUM: 4.3 mmol/L (ref 3.5–5.1)
Sodium: 124 mmol/L — ABNORMAL LOW (ref 135–145)

## 2016-11-08 LAB — POCT I-STAT 3, ART BLOOD GAS (G3+)
Acid-base deficit: 10 mmol/L — ABNORMAL HIGH (ref 0.0–2.0)
Bicarbonate: 17.6 mmol/L — ABNORMAL LOW (ref 20.0–28.0)
O2 Saturation: 94 %
TCO2: 19 mmol/L (ref 0–100)
pCO2 arterial: 43.7 mmHg (ref 32.0–48.0)
pH, Arterial: 7.213 — ABNORMAL LOW (ref 7.350–7.450)
pO2, Arterial: 88 mmHg (ref 83.0–108.0)

## 2016-11-08 MED ORDER — AMIODARONE HCL IN DEXTROSE 360-4.14 MG/200ML-% IV SOLN
INTRAVENOUS | Status: AC
  Administered 2016-11-08: 03:00:00
  Filled 2016-11-08: qty 200

## 2016-11-08 MED ORDER — HEPARIN BOLUS VIA INFUSION
2000.0000 [IU] | Freq: Once | INTRAVENOUS | Status: AC
  Administered 2016-11-08: 2000 [IU] via INTRAVENOUS
  Filled 2016-11-08: qty 2000

## 2016-11-08 MED ORDER — AMIODARONE HCL IN DEXTROSE 360-4.14 MG/200ML-% IV SOLN
30.0000 mg/h | INTRAVENOUS | Status: DC
  Administered 2016-11-09 – 2016-11-12 (×9): 30 mg/h via INTRAVENOUS
  Filled 2016-11-08 (×9): qty 200

## 2016-11-08 MED ORDER — AMIODARONE LOAD VIA INFUSION
150.0000 mg | Freq: Once | INTRAVENOUS | Status: AC
  Administered 2016-11-08: 150 mg via INTRAVENOUS
  Filled 2016-11-08: qty 83.34

## 2016-11-08 MED ORDER — SODIUM BICARBONATE 8.4 % IV SOLN
50.0000 meq | Freq: Once | INTRAVENOUS | Status: AC
  Administered 2016-11-08: 50 meq via INTRAVENOUS
  Filled 2016-11-08: qty 50

## 2016-11-08 MED ORDER — AMIODARONE LOAD VIA INFUSION
150.0000 mg | Freq: Once | INTRAVENOUS | Status: AC
  Administered 2016-11-08: 150 mg via INTRAVENOUS

## 2016-11-08 MED ORDER — POTASSIUM CHLORIDE 2 MEQ/ML IV SOLN
30.0000 meq | Freq: Once | INTRAVENOUS | Status: AC
  Administered 2016-11-08: 30 meq via INTRAVENOUS
  Filled 2016-11-08: qty 15

## 2016-11-08 MED ORDER — SODIUM CHLORIDE 0.9 % IV BOLUS (SEPSIS)
500.0000 mL | Freq: Once | INTRAVENOUS | Status: AC
  Administered 2016-11-08: 500 mL via INTRAVENOUS

## 2016-11-08 MED ORDER — SODIUM CHLORIDE 0.9 % IV SOLN: INTRAVENOUS | Status: DC | PRN

## 2016-11-08 MED ORDER — AMIODARONE HCL IN DEXTROSE 360-4.14 MG/200ML-% IV SOLN
60.0000 mg/h | INTRAVENOUS | Status: DC
  Administered 2016-11-08 (×2): 60 mg/h via INTRAVENOUS
  Filled 2016-11-08: qty 200

## 2016-11-08 MED ORDER — MAGNESIUM SULFATE 50 % IJ SOLN
6.0000 g | Freq: Once | INTRAVENOUS | Status: AC
  Administered 2016-11-08: 6 g via INTRAVENOUS
  Filled 2016-11-08: qty 12

## 2016-11-08 MED ORDER — HALOPERIDOL LACTATE 5 MG/ML IJ SOLN
5.0000 mg | Freq: Four times a day (QID) | INTRAMUSCULAR | Status: DC | PRN
  Administered 2016-11-08 – 2016-11-09 (×2): 5 mg via INTRAVENOUS
  Filled 2016-11-08 (×3): qty 1

## 2016-11-08 NOTE — Progress Notes (Signed)
[place] Progress Note Patient Name: YAMIRA PAPA DOB: 12/09/1945 MRN: [id number]   Date of Service  11/08/2016  HPI/Events of Note  Afib with HR 170; BP low on pressors.   eICU Interventions  Ordered amio bolus and infusion.      Intervention Category Major Interventions: Arrhythmia - evaluation and management  Takao Lizer 11/08/2016, 3:07 AM

## 2016-11-08 NOTE — Op Note (Signed)
NAMELORRINDA, RAMSTAD                 ACCOUNT NO.:  [id number]  MEDICAL RECORD NO.:  [id number]  LOCATION:                                 FACILITY:  PHYSICIAN:  Lanelle Bal, MD    DATE OF BIRTH:  1946/04/09  DATE OF PROCEDURE:  11/05/2016 DATE OF DISCHARGE:                              OPERATIVE REPORT   PREOPERATIVE DIAGNOSIS:  Persistent air leak after right upper lobectomy for non-small cell carcinoma of the lung.  PROCEDURE PERFORMED:  Video bronchoscopy under general anesthesia with placement of a Spiration intrabronchial valves x3 in medial and lateral segments of the middle lobe and superior segment of right lower lobe.  SURGEON:  Lanelle Bal, MD.  BRIEF HISTORY:  The patient is a 71 year old female, who the week previously had undergone right upper lobectomy with node dissection for non-small cell carcinoma of the lung.  At the time of initial surgery, she had very incomplete fissures.  Postoperatively she has had a persistent air leak and development of subcutaneous air.  Because of persistent air leak, we discussed with her proceeding with bronchoscopy and placement of intrabronchial valves to attempt to decrease or stop the air leak.  The patient agreed and signed informed consent.  She was understood that the valves would require repeat surgery for removal 8 weeks postoperatively.  DESCRIPTION OF PROCEDURE:  The patient underwent general endotracheal anesthesia without incident.  Single-lumen endotracheal tube was placed. Appropriate time-out was performed.  We proceeded with the bronchoscopy with a 2.8 mm video bronchoscope.  The tracheobronchial tree was carefully inspected, some tenacious secretions were suctioned from the right lower lobe.  The left tracheobronchial tree appeared intact.  On examination of the right tracheobronchial tree, the stapled suture line of the right upper lobe bronchus appeared intact and well healed.  We then proceeded in a  systematic manner placing a Fogarty balloon to try to isolate segments where the majority of the leak was coming from. With the balloon inflated in the bronchus intermedius, the air leak stopped.  Initially, we had a greatest decrease in air leak with occlusion of the superior segment of right lower lobe although did not completely stop with this. We measured with the inflation balloon and deployed a 7 mm valve in the superior segment of the right lower lobe. We then continued in a systematic manner occluding each segmental bronchus.  Then again had the greatest decrease with the middle lobe bronchus occluded, in the medial and lateral segment of the middle lobe 2 valves were deployed each had measured at 9 mm.  Operative photographs of the position of the valve were taken and was placed in the electronic record.  The patient tolerated the procedure without complication although the air leak did not completely stop and there appeared to be a marked improvement.  The chest tubes were left in place.  The patient was extubated in the operating room and transferred to the recovery room for postoperative care.     Lanelle Bal, MD   ______________________________ Lanelle Bal, MD    EG/MEDQ  D:  11/07/2016  T:  11/08/2016  Job:  [id number]

## 2016-11-08 NOTE — Progress Notes (Signed)
CRITICAL VALUE ALERT  Critical value received: Lactic Acid 4.5  Date of notification:  11/08/16  Time of notification: 0129  Critical value read back:Yes  Nurse who received alert:  Naaman Plummer, RN  MD notified (1st page): E-link  Time of first page: 0131  Time MD responded: [phone]

## 2016-11-08 NOTE — Progress Notes (Signed)
Pharmacy Antibiotic Note  [name] is a 72 y.o. female  with likely sepsis on vancomycin/zosyn..  She is now noted with hypotension on pressors and worsening renal function.  WBC= 6.8> 44.8, tmax= 103, PCT= 180, LA= 4.5 -Scr= 1.75 (up from 0.79)  Plan: -Hold vancomycin and check a random level in am -Zosyn 3.35g IV q8h -Will follow renal function, cultures and clinical progress    Height: 5' 3.5" (161.3 cm) Weight: 142 lb 10.2 oz (64.7 kg) IBW/kg (Calculated) : 53.55  Temp (24hrs), Avg:101.6 F (38.7 C), Min:99.9 F (37.7 C), Max:103 F (39.4 C)   Recent Labs Lab 11/02/16 0359 11/05/16 0247 11/07/16 0802 11/07/16 0810 11/07/16 1155 11/07/16 1717 11/08/16 0022 11/08/16 0227 11/08/16 0432  WBC 9.7 8.4  --  6.8  --   --   --   --  44.8*  CREATININE 0.64 0.61  --  0.79  --   --   --  1.75*  --   LATICACIDVEN  --   --  2.0*  --  2.1* 3.3* 4.5*  --   --     Estimated Creatinine Clearance: 27 mL/min (by C-G formula based on SCr of 1.75 mg/dL (H)).    Allergies  Allergen Reactions  . Nickel Rash    Antimicrobials this admission: 1/24 vanc 1/24 zosyn   Microbiology results: 1/24 urine-neg 1/24 blood x2 1/22 resp- ngtd  Thank you for allowing pharmacy to be a part of this patient's care.  Hildred Laser, Pharm D 11/08/2016 11:24 AM

## 2016-11-08 NOTE — Procedures (Signed)
Arterial Catheter Insertion Procedure Note [name] [id number] 02/26/1946  Procedure: Insertion of Arterial Catheter  Indications: Blood pressure monitoring and Frequent blood sampling  Procedure Details Consent: Risks of procedure as well as the alternatives and risks of each were explained to the (patient/caregiver).  Consent for procedure obtained. and Unable to obtain consent because of altered level of consciousness. Time Out: Verified patient identification, verified procedure, site/side was marked, verified correct patient position, special equipment/implants available, medications/allergies/relevent history reviewed, required imaging and test results available.  Performed  Maximum sterile technique was used including antiseptics, cap, gloves, gown, hand hygiene, mask and sheet. Skin prep: Chlorhexidine; local anesthetic administered 20 gauge catheter was inserted into right radial artery using the Seldinger technique.  Evaluation Blood flow good; BP tracing good. Complications: No apparent complications.   [name] 11/08/2016

## 2016-11-08 NOTE — Progress Notes (Signed)
ANTICOAGULATION CONSULT NOTE - Follow Up Consult  Pharmacy Consult for Heparin Indication: b/l peroneal DVT  Allergies  Allergen Reactions  . Nickel Rash    Patient Measurements: Height: 5' 3.5" (161.3 cm) Weight: 142 lb 10.2 oz (64.7 kg) IBW/kg (Calculated) : 53.55 Heparin Dosing Weight: 65 kg  Vital Signs: Temp: 102.9 F (39.4 C) (01/25 1144) Temp Source: Oral (01/25 1144) BP: 90/58 (01/25 0845) Pulse Rate: 95 (01/25 0845)  Labs:  Recent Labs  11/07/16 0810 11/07/16 0855 11/07/16 1415 11/08/16 0227 11/08/16 0432 11/08/16 1136  HGB 12.4  --   --   --  12.5  --   HCT 36.9  --   --   --  35.6*  --   PLT 282  --   --   --  203  --   HEPARINUNFRC  --   --   --  0.13*  --  0.41  CREATININE 0.79  --   --  1.75*  --   --   TROPONINI  --  <0.03 <0.03  --   --   --     Estimated Creatinine Clearance: 27 mL/min (by C-G formula based on SCr of 1.75 mg/dL (H)).  Assessment: 71 y.o. M on heparin for afib and new b/l peroneal DVT on dopplers. Heparin level therapeutic (0.41) on gtt at 1300 units/hr.   Goal of Therapy:  Heparin level 0.3-0.7 units/ml Monitor platelets by anticoagulation protocol: Yes   Plan:  -No heparin changes needed -Will confirm a heparin level later today   Hildred Laser, Pharm D 11/08/2016 12:48 PM

## 2016-11-08 NOTE — Progress Notes (Signed)
Patient ID: [name], female   DOB: Feb 06, 1946, 71 y.o.   MRN: [id number]  SICU Evening Rounds:  Labile BP on vasopressin, levophed and neo. CVP 16-17  Went back into afib with RVR today and given amio bolus, remains on drip.  Put on bipap this afternoon for desat when turning. Now sat 97%. 70% FiO2.  Urine output ok.  Creat up to 1.99 this evening   Venous lactic acid 2.1.  CCM following closely for sepsis and respiratory failure.

## 2016-11-08 NOTE — Progress Notes (Signed)
ANTICOAGULATION CONSULT NOTE - Follow Up Consult  Pharmacy Consult for Heparin Indication: b/l peroneal DVT  Allergies  Allergen Reactions  . Nickel Rash    Patient Measurements: Height: 5' 3.5" (161.3 cm) Weight: 142 lb 10.2 oz (64.7 kg) IBW/kg (Calculated) : 53.55 Heparin Dosing Weight: 65 kg  Vital Signs: Temp: 98.2 F (36.8 C) (01/25 1950) Temp Source: Axillary (01/25 1950) BP: 99/58 (01/25 2015) Pulse Rate: 125 (01/25 2027)  Labs:  Recent Labs  11/07/16 0810 11/07/16 0855 11/07/16 1415 11/08/16 0227 11/08/16 0432 11/08/16 1136 11/08/16 1607 11/08/16 2000  HGB 12.4  --   --   --  12.5  --  11.5*  --   HCT 36.9  --   --   --  35.6*  --  33.4*  --   PLT 282  --   --   --  203  --  166  --   HEPARINUNFRC  --   --   --  0.13*  --  0.41  --  0.32  CREATININE 0.79  --   --  1.75*  --   --  1.99*  --   TROPONINI  --  <0.03 <0.03  --   --   --   --   --     Estimated Creatinine Clearance: 23.7 mL/min (by C-G formula based on SCr of 1.99 mg/dL (H)).  Assessment: 71 y.o. M on heparin for afib and new b/l peroneal DVT on dopplers. Heparin level therapeutic (0.32) on gtt at 1300 units/hr. Hg 11.5, ptl wnl, no bleed documented.  Goal of Therapy:  Heparin level 0.3-0.7 units/ml Monitor platelets by anticoagulation protocol: Yes   Plan:  Continue heparin at 1300 units/h Daily heparin level/CBC Monitor for s/sx bleeding   Elicia Lamp, PharmD, BCPS Clinical Pharmacist 11/08/2016 8:41 PM

## 2016-11-08 NOTE — Progress Notes (Signed)
eLink Physician-Brief Progress Note Patient Name: [name] DOB: 25-Jun-1946 MRN: [id number]   Date of Service  11/08/2016  HPI/Events of Note  Dopplers positive for bilat peroneal DVT; Echo unremarkable.   eICU Interventions  Pt already on heparin infusion, no further orders.         Laverle Hobby 11/08/2016, 12:36 AM

## 2016-11-08 NOTE — Progress Notes (Signed)
eLink Physician-Brief Progress Note Patient Name: [name] DOB: 1946/10/15 MRN: [id number]   Date of Service  11/08/2016  HPI/Events of Note  Increasingly resless on bipap  eICU Interventions  Check abgs/ if ok will increase sedation, if not consider re-et/vent     Intervention Category Major Interventions: Respiratory failure - evaluation and management  Christinia Gully 11/08/2016, 3:24 PM

## 2016-11-08 NOTE — Progress Notes (Signed)
Patient ID: [name], female   DOB: 1946/03/09, 71 y.o.   MRN: [id number] [facility] DAILY ICU PROGRESS NOTE                   [address]            [place] [address]          [phone]  Total Length of Stay:  LOS: 10 days  3 Days Post-Op Procedure(s) (LRB): VIDEO BRONCHOSCOPY WITH INSERTION OF INTERBRONCHIAL VALVE (IBV) times three (N/A) Day Post-Op Procedure(s) (LRB): VIDEO BRONCHOSCOPY WITH INSERTION OF INTERBRONCHIAL VALVE (IBV) times three (N/A) Day 9:Post-Op Procedure(s) (LRB): VIDEO BRONCHOSCOPY (N/A) RIGHT VIDEO ASSISTED THORACOSCOPY /RIGHT UPPER LOBE LUNG RESECTION and Right Thoracotomy (Right) LYMPH NODE DISSECTION (Right   Subjective: Now  With afib rapid response, more alert and talkative wants some thing to drink  Objective: Vital signs in last 24 hours: Temp:  [99.9 F (37.7 C)-103 F (39.4 C)] 100.6 F (38.1 C) (01/25 0737) Pulse Rate:  [99-174] 108 (01/25 0717) Cardiac Rhythm: Sinus tachycardia (01/24 2200) Resp:  [14-34] 32 (01/25 0717) BP: (67-159)/(38-90) 99/52 (01/25 0717) SpO2:  [86 %-100 %] 96 % (01/25 0717) FiO2 (%):  [55 %-100 %] 100 % (01/24 1430)  Filed Weights   10/31/16 0500 11/01/16 0700 11/06/16 0400  Weight: 143 lb 4.8 oz (65 kg) 140 lb 6.9 oz (63.7 kg) 142 lb 10.2 oz (64.7 kg)    Weight change:    Hemodynamic parameters for last 24 hours: CVP:  [1 mmHg-63 mmHg] 14 mmHg  Intake/Output from previous day: 01/24 0701 - 01/25 0700 In: 4385 [I.V.:3985; IV Piggyback:400] Out: 1630 [ZPHXT:0569; Chest Tube:560]  Intake/Output this shift: No intake/output data recorded.  Current Meds: Scheduled Meds: . amiodarone  150 mg Intravenous Once  . bisacodyl  10 mg Oral Daily  . guaiFENesin  600 mg Oral BID  . ipratropium  0.5 mg Nebulization Q6H  . levalbuterol  0.63 mg Nebulization Q6H  . magnesium sulfate 1 - 4 g bolus IVPB  6 g Intravenous Once  . mouth rinse  15 mL Mouth Rinse BID  . piperacillin-tazobactam (ZOSYN)   IV  3.375 g Intravenous Q8H  . potassium chloride (KCL MULTIRUN) 30 mEq in 265 mL IVPB  30 mEq Intravenous Once  . senna-docusate  1 tablet Oral QHS  . simvastatin  20 mg Oral QHS  . sodium chloride flush  10-40 mL Intracatheter Q12H  . sodium chloride flush  10-40 mL Intracatheter Q12H  . vancomycin  500 mg Intravenous Q12H   Continuous Infusions: . sodium chloride 10 mL/hr at 11/03/16 0400  . amiodarone 60 mg/hr (11/08/16 0700)   Followed by  . amiodarone    . dextrose 5 % and 0.45% NaCl 100 mL/hr at 11/08/16 0700  . heparin 1,300 Units/hr (11/08/16 0700)  . norepinephrine (LEVOPHED) Adult infusion 45 mcg/min (11/08/16 0700)  . phenylephrine (NEO-SYNEPHRINE) Adult infusion 120 mcg/min (11/08/16 0700)  . vasopressin (PITRESSIN) infusion - *FOR SHOCK* 0.03 Units/min (11/08/16 0700)   PRN Meds:.acetaminophen, albuterol, potassium chloride (KCL MULTIRUN) 30 mEq in 265 mL IVPB, sodium chloride flush, sodium chloride flush, traMADol  General appearance: cooperative and mild distress Neurologic: intact Heart: irregularly irregular rhythm Lungs: diminished breath sounds bibasilar Abdomen: soft, non-tender; bowel sounds normal; no masses,  no organomegaly Extremities: extremities normal, atraumatic, no cyanosis or edema and Homans sign is negative, no sign of DVT Wound: still eith air leak  Lab Results: CBC: Recent Labs  11/07/16 0810  11/08/16 0432  WBC 6.8 44.8*  HGB 12.4 12.5  HCT 36.9 35.6*  PLT 282 203   BMET:  Recent Labs  11/07/16 0810 11/08/16 0227  NA 133* 129*  K 4.5 3.8  CL 95* 94*  CO2 27 19*  GLUCOSE 120* 199*  BUN 12 26*  CREATININE 0.79 1.75*  CALCIUM 8.8* 7.4*    CMET: Lab Results  Component Value Date   WBC 44.8 (H) 11/08/2016   HGB 12.5 11/08/2016   HCT 35.6 (L) 11/08/2016   PLT 203 11/08/2016   GLUCOSE 199 (H) 11/08/2016   ALT 20 10/31/2016   AST 31 10/31/2016   NA 129 (L) 11/08/2016   K 3.8 11/08/2016   CL 94 (L) 11/08/2016   CREATININE  1.75 (H) 11/08/2016   BUN 26 (H) 11/08/2016   CO2 19 (L) 11/08/2016   INR 0.99 10/25/2016      PT/INR: No results for input(s): LABPROT, INR in the last 72 hours. Radiology: Dg Chest Portable 1 View  Addendum Date: 11/07/2016   ADDENDUM REPORT: 11/07/2016 11:47 ADDENDUM: For distal SVC position the central line should be retracted about 2 cm. Electronically Signed   By: Lahoma Crocker M.D.   On: 11/07/2016 11:47   Result Date: 11/07/2016 CLINICAL DATA:  Central line placement EXAM: PORTABLE CHEST 1 VIEW COMPARISON:  11/07/2016 FINDINGS: Cardiomediastinal silhouette is stable. Two right side chest tubes again noted. Stable right basilar atelectasis or infiltrate. Extensive subcutaneous emphysema right chest wall and right supraclavicular again noted. Again noted right upper pneumothorax measures about 3 cm maximum thickness. On the prior exam measures 2.3 cm maximum thickness slight progressed in size. There is right IJ central line with tip in right atrium. Small subcutaneous emphysema left lower chest wall. Stable postsurgical changes post right upper lobectomy. IMPRESSION: Two right-sided chest tubes again noted. Slight progression of the right upper pneumothorax measures 3 cm maximum thickness on the prior exam measures 2.3 cm maximum thickness. Persistent right basilar atelectasis or infiltrate. Right IJ central line with tip in right atrium. Subcutaneous emphysema right chest wall and right supraclavicular region again noted. Electronically Signed: By: Lahoma Crocker M.D. On: 11/07/2016 11:35     Assessment/Plan: Acute sepsis yesterday with acute renal injury, improving on iv anabiotics but without  Positive culture lung likely source Rapid afib on Cordarone infusion Below knee limited dvt but with risk of extension with inactivity  now on heparin critical care following    [name] 11/08/2016 8:11 AM

## 2016-11-08 NOTE — Progress Notes (Signed)
Pt. Now in afib with rate from 009-233, BP systolic 00-76 diastolic 22Q. Dr. [name] with [facility] notified. Amiodarone bolus and infusion ordered and started. Pressors titrated for BP. 02 sats 89%, switched from 15 liters HFNC to nonrebreather at this time. Patient remains alert and oriented. Denies SOB or difficulty breathing.

## 2016-11-08 NOTE — Progress Notes (Signed)
  Amiodarone Drug - Drug Interaction Consult Note  Recommendations:  As below  Amiodarone is metabolized by the cytochrome P450 system and therefore has the potential to cause many drug interactions. Amiodarone has an average plasma half-life of 50 days (range 20 to 100 days).   There is potential for drug interactions to occur several weeks or months after stopping treatment and the onset of drug interactions may be slow after initiating amiodarone.   '[x]'$  Statins: Increased risk of myopathy. Simvastatin- restrict dose to '20mg'$  daily. Other statins: counsel patients to report any muscle pain or weakness immediately.  Thank You,  Sindy Guadeloupe  11/08/2016 3:14 AM

## 2016-11-08 NOTE — Progress Notes (Addendum)
PULMONARY / CRITICAL CARE MEDICINE   Name: EDWINNA ROCHETTE MRN: [id number] DOB: 03/20/1946    ADMISSION DATE:  10/29/2016 CONSULTATION DATE:  11/07/2016  REFERRING MD:  Lanelle Bal, M.D. / CVTS  CHIEF COMPLAINT:  Acute Hypoxic Respiratory Failure  HISTORY OF PRESENT ILLNESS:  71 y.o. 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 Patient did undergo endobronchial valve replacement on 1/22 for her persistent air leak. Has previously been ambulatory in the ICU and out of bed without difficulty. Patient has been receiving oxycodone for pain associated with her right chest tubes and postoperative pain. Patient did receive Phenergan this morning for nausea and did also have subsequent emesis. She had worsening hypoxia and respiratory status prompting initiation of BiPAP support and PCCM consultation.  SUBJECTIVE: No acute events overnight. Patient was started on a heparin drip for her calf DVTs. Patient reports no nausea, vomiting, or abdominal pain. She denies any chest pain, pressure, or tightness. Also started on amiodarone drip overnight for atrial fibrillation with rapid ventricular response.  REVIEW OF SYSTEMS: No subjective fever, chills, or sweats. No cough. Reports improvement in dyspnea. Mild left-sided headache.  VITAL SIGNS: BP (!) 99/52   Pulse (!) 108   Temp (!) 100.6 F (38.1 C) (Oral)   Resp (!) 32   Ht 5' 3.5" (1.613 m)   Wt 142 lb 10.2 oz (64.7 kg)   SpO2 96%   BMI 24.87 kg/m   HEMODYNAMICS: CVP:  [1 mmHg-63 mmHg] 14 mmHg  VENTILATOR SETTINGS: Vent Mode: BIPAP FiO2 (%):  [55 %-100 %] 100 % PEEP:  [5 cmH20] 5 cmH20 Pressure Support:  [5 cmH20] 5 cmH20  INTAKE / OUTPUT: I/O last 3 completed shifts: In: 4885 [P.O.:480; I.V.:4005; IV Piggyback:400] Out: 2025 [KYHCW:2376; Chest Tube:720]  PHYSICAL EXAMINATION: General:  Awake. Alert. No acute distress. No family at bedside.  Integument:   Warm & dry. No rash on exposed skin.  Extremities:  No cyanosis or clubbing.  HEENT:  Moist mucus membranes. No oral ulcers. No scleral injection or icterus.  Cardiovascular:  Tachycardic with irregular rhythm. Atrial fibrillation on telemetry. No edema. No appreciable JVD.  Pulmonary:  Mild crackles in the right lung. Overall mildly prolonged exhalation phase. Normal work of breathing on high flow nasal cannula oxygen. Abdomen: Soft. Normal bowel sounds. Nondistended. Grossly nontender. Musculoskeletal:  Normal bulk and tone. No joint deformity or effusion appreciated. Neurological: No meningismus. Cranial nerves grossly intact. Alert and oriented 3.  LABS: CBC Latest Ref Rng & Units 11/08/2016 11/07/2016 11/05/2016  WBC 4.0 - 10.5 K/uL 44.8(H) 6.8 8.4  Hemoglobin 12.0 - 15.0 g/dL 12.5 12.4 10.2(L)  Hematocrit 36.0 - 46.0 % 35.6(L) 36.9 30.7(L)  Platelets 150 - 400 K/uL 203 282 247   BMP Latest Ref Rng & Units 11/08/2016 11/07/2016 11/05/2016  Glucose 65 - 99 mg/dL 199(H) 120(H) 114(H)  BUN 6 - 20 mg/dL 26(H) 12 10  Creatinine 0.44 - 1.00 mg/dL 1.75(H) 0.79 0.61  Sodium 135 - 145 mmol/L 129(L) 133(L) 134(L)  Potassium 3.5 - 5.1 mmol/L 3.8 4.5 3.7  Chloride 101 - 111 mmol/L 94(L) 95(L) 98(L)  CO2 22 - 32 mmol/L 19(L) 27 28  Calcium 8.9 - 10.3 mg/dL 7.4(L) 8.8(L) 8.6(L)    STUDIES:  PFT 08/24/16: FVC 2.54 L (89%) FEV1 1.22 L (57%) FEV1/FVC 0.48 FEF 25-75 0.44 L (24%) negative bronchial dilator response TLC 5.19 L (120%) RV 155% ERV 76% DLCO uncorrected 52%  PORT CXR  1/24:  Previously reviewed by me. No new left opacity. No appreciated pleural effusion. Right basilar opacity relatively unchanged but need be slightly worsened. Right-sided chest tubes remain in place.   TTE 1/24:  Poor quality study. Unable to assess RV. No pericardial effusion. LV with mild LVH but normal in size. EF 60-65%. Unable to assess wall motion. Grade 1 diastolic dysfunction.  BILAT LE VENOUS DUPLEX 1/24:  There is  evidence of acute deep vein thrombosis involving the peroneal veins of the right and left lower extremities.  MICROBIOLOGY: MRSA PCR 1/11:  Negative Right Bronchial Washing 1/22:  Negative Blood Ctx x2 1/24 >> Urine Ctx 1/24 >>  ANTIBIOTICS: Vancomycin 1/24 >> Zosyn 1/24 >>  SIGNIFICANT EVENTS: 1/15 - Admit w/ RUL Lobectomy 1/22 - Bronchoscopy with 3 right-sided endobronchial valves for persistent air leak  LINES/TUBES: Right Chest Tubes 1/15 >> R IJ CLV 1/24 >> Foley 1/24 >> PIV  ASSESSMENT / PLAN:  71 y.o. female admitted for right upper lobectomy completed on 1/15. Patient had subsequent persistent air leak. Patient's shock and acute hypoxic respiratory failure likely due to evolving sepsis. Source likely from her lung given recent surgery. No other source identified at this time. No indication infection for stress dose steroids. Continuing amiodarone and systemic anticoagulation.  1. Acute Hypoxic Respiratory Failure: 220 FiO2 for saturation greater than 90%. Using BiPAP as needed for increased work of breathing. 2. Septic Shock:  Likely due to developing pneumonia. Supporting MAP and systolic blood pressure w/ Levophed, Neo-Synephrine, & Vasopressin infusions. Trending Procalcitonin per algorithm. Repeating lactic acid with tomorrow morning labs. 3. Acute Renal Failure:  Likely pre-renal due to septic shock. Improving urine output. Checking bilateral renal ultrasound. Trending urine output with Foley catheter. Monitoring electrolytes and renal function daily with labs. 4. Hypomagnesemia: Replacing magnesium sulfate 6 g IV. Repeat magnesium level with a.m. labs tomorrow. 5. Atrial Fibrillation with Rapid Ventricular Response: Management per CVTS. Amiodarone drip as well as heparin drip continuing. 6. Bilateral Calf Vein/Peroneal DVT: Continuing on heparin drip per pharmacy protocol. Monitor hemoglobin daily with CBC. 7. Persistent Right Pneumothorax & Air Leak:  Secondary to  lobectomy. Chest tubes in place & endobronchial valves placed 1/22. Management per CVTS. 8. Moderate-Severe COPD:  Continuing Xopenex & Atrovent nebs q6hr scheduled. Holding off on steroid therapy at this time. 9. Nausea w/ Emesis:  Resolved. 10. Essential Hypertension:  Currently hypotensive. Holding off on oral medications at this time.  11. Hyperlipidemia:  Continuing Zocor qhs. 12. Diet:  Starting clear liquid diet. 13. Prophylaxis:  Continuing systemic anticoagulation with heparin drip.   I have spent a total of 31 minutes of critical care time today caring for the patient and reviewing the patient's electronic medical record.   Sonia Baller Ashok Cordia, M.D. [facility] Pulmonary & Critical Care Pager:  [phone] After 3pm or if no response, call [phone] 11/08/2016, 7:56 AM

## 2016-11-08 NOTE — Progress Notes (Addendum)
Called to bedside by E link to assess the patient.  Pt with RUL lobectomy for non small cell lung cancer. Post op course complicated by persistent air leak s/p endobronchial valve. Nurse reports that pt is more confused. Started on Bipap.  ABG reviewed- Shows mostly metabolic acidosis. Labs from this morning shows elevated LA with increasing WBC count  Blood pressure 129/61, pulse (!) 123, temperature 99.3 F (37.4 C), temperature source Axillary, resp. rate (!) 31, height 5' 3.5" (1.613 m), weight 142 lb 10.2 oz (64.7 kg), SpO2 94 %. Gen:      No acute distress HEENT:  EOMI, sclera anicteric Neck:     No masses; no thyromegaly Lungs:    Mild distress. Tachypneic. Diffuse b/l rhonchi.  CV:         Regular rate and rhythm; no murmurs Abd:      + bowel sounds; soft, non-tender; no palpable masses, no distension Ext:    No edema; adequate peripheral perfusion Skin:      Warm and dry; no rash Neuro: alert and oriented x 3 Psych: normal mood and affect  Assessment/Plan: RUL lobectomy Non small cell cancer Perisitent air leak. Likely worsening sepsis with delirium  Now with resp distress, metabolic acidosis. Elevated LA and WBC count concerning for infection She was started on vanco, zosyn yesterday Continuing on heparin drip for DVT Will recheck CXR, labs including LA, CBC and Met panel 500 CC bolus IVF since CVP is 8 Use haldol PRN, check qTC  We will continue close monitoring for intubation needs but would like to avoid given the persistent air leak. Husband updated at bedside. He said that she has a living will and would be OK with short term intubation but not prolonged life support.  Additional critical care time-

## 2016-11-08 NOTE — Progress Notes (Signed)
ANTICOAGULATION CONSULT NOTE - Follow Up Consult  Pharmacy Consult for Heparin Indication: b/l peroneal DVT  Allergies  Allergen Reactions  . Nickel Rash    Patient Measurements: Height: 5' 3.5" (161.3 cm) Weight: 142 lb 10.2 oz (64.7 kg) IBW/kg (Calculated) : 53.55 Heparin Dosing Weight: 65 kg  Vital Signs: Temp: 99.9 F (37.7 C) (01/24 2359) Temp Source: Axillary (01/24 2359) BP: 95/59 (01/25 0245) Pulse Rate: 124 (01/25 0245)  Labs:  Recent Labs  11/07/16 0810 11/07/16 0855 11/07/16 1415 11/08/16 0227  HGB 12.4  --   --   --   HCT 36.9  --   --   --   PLT 282  --   --   --   HEPARINUNFRC  --   --   --  0.13*  CREATININE 0.79  --   --  1.75*  TROPONINI  --  <0.03 <0.03  --     Estimated Creatinine Clearance: 27 mL/min (by C-G formula based on SCr of 1.75 mg/dL (H)).  Assessment: 71 y.o. M on heparin for new b/l peroneal DVT on dopplers. Heparin level subtherapeutic (0.13) on gtt at 1100 units/hr. No issues with line or bleeding reported per RN.  Goal of Therapy:  Heparin level 0.3-0.7 units/ml Monitor platelets by anticoagulation protocol: Yes   Plan:  Bolus heparin 2000 units Increase heparin gtt to 1300 units/hr Will f/u 8hr heparin level  Sherlon Handing, PharmD, BCPS Clinical pharmacist, pager [phone] 11/08/2016,3:19 AM

## 2016-11-09 ENCOUNTER — Inpatient Hospital Stay (HOSPITAL_COMMUNITY): Payer: Medicare Other

## 2016-11-09 DIAGNOSIS — R410 Disorientation, unspecified: Secondary | ICD-10-CM

## 2016-11-09 LAB — BLOOD GAS, ARTERIAL
Acid-base deficit: 6.6 mmol/L — ABNORMAL HIGH (ref 0.0–2.0)
Bicarbonate: 18 mmol/L — ABNORMAL LOW (ref 20.0–28.0)
Delivery systems: POSITIVE
Drawn by: 252031
Expiratory PAP: 5
FIO2: 40
Inspiratory PAP: 15
O2 Saturation: 94.8 %
Patient temperature: 98.6
RATE: 15 resp/min
pCO2 arterial: 33.3 mmHg (ref 32.0–48.0)
pH, Arterial: 7.351 (ref 7.350–7.450)
pO2, Arterial: 75.9 mmHg — ABNORMAL LOW (ref 83.0–108.0)

## 2016-11-09 LAB — GLUCOSE, CAPILLARY
Glucose-Capillary: 156 mg/dL — ABNORMAL HIGH (ref 65–99)
Glucose-Capillary: 122 mg/dL — ABNORMAL HIGH (ref 65–99)
Glucose-Capillary: 127 mg/dL — ABNORMAL HIGH (ref 65–99)
Glucose-Capillary: 144 mg/dL — ABNORMAL HIGH (ref 65–99)

## 2016-11-09 LAB — POCT I-STAT 3, ART BLOOD GAS (G3+)
Acid-base deficit: 3 mmol/L — ABNORMAL HIGH (ref 0.0–2.0)
Acid-base deficit: 6 mmol/L — ABNORMAL HIGH (ref 0.0–2.0)
Bicarbonate: 22.8 mmol/L (ref 20.0–28.0)
Bicarbonate: 21.4 mmol/L (ref 20.0–28.0)
O2 Saturation: 100 %
O2 Saturation: 96 %
Patient temperature: 98.6
TCO2: 24 mmol/L (ref 0–100)
TCO2: 23 mmol/L (ref 0–100)
pCO2 arterial: 41.6 mmHg (ref 32.0–48.0)
pCO2 arterial: 47 mmHg (ref 32.0–48.0)
pH, Arterial: 7.347 — ABNORMAL LOW (ref 7.350–7.450)
pH, Arterial: 7.266 — ABNORMAL LOW (ref 7.350–7.450)
pO2, Arterial: 179 mmHg — ABNORMAL HIGH (ref 83.0–108.0)
pO2, Arterial: 97 mmHg (ref 83.0–108.0)

## 2016-11-09 LAB — COMPREHENSIVE METABOLIC PANEL
ALK PHOS: 56 U/L (ref 38–126)
ALT: 31 U/L (ref 14–54)
ANION GAP: 9 (ref 5–15)
AST: 39 U/L (ref 15–41)
Albumin: 1.6 g/dL — ABNORMAL LOW (ref 3.5–5.0)
BILIRUBIN TOTAL: 0.5 mg/dL (ref 0.3–1.2)
BUN: 37 mg/dL — ABNORMAL HIGH (ref 6–20)
CALCIUM: 5.8 mg/dL — AB (ref 8.9–10.3)
CO2: 19 mmol/L — ABNORMAL LOW (ref 22–32)
Chloride: 97 mmol/L — ABNORMAL LOW (ref 101–111)
Creatinine, Ser: 2.44 mg/dL — ABNORMAL HIGH (ref 0.44–1.00)
GFR calc non Af Amer: 19 mL/min — ABNORMAL LOW (ref >60)
GFR, EST AFRICAN AMERICAN: 22 mL/min — AB (ref >60)
Glucose, Bld: 171 mg/dL — ABNORMAL HIGH (ref 65–99)
Potassium: 3.7 mmol/L (ref 3.5–5.1)
Sodium: 125 mmol/L — ABNORMAL LOW (ref 135–145)
TOTAL PROTEIN: 3.8 g/dL — AB (ref 6.5–8.1)

## 2016-11-09 LAB — CBC WITH DIFFERENTIAL/PLATELET
Basophils Absolute: 0 10*3/uL (ref 0.0–0.1)
Basophils Relative: 0 %
EOS ABS: 0.3 10*3/uL (ref 0.0–0.7)
Eosinophils Relative: 1 %
HCT: 28 % — ABNORMAL LOW (ref 36.0–46.0)
Hemoglobin: 9.7 g/dL — ABNORMAL LOW (ref 12.0–15.0)
LYMPHS PCT: 2 %
Lymphs Abs: 0.6 10*3/uL — ABNORMAL LOW (ref 0.7–4.0)
MCH: 30.5 pg (ref 26.0–34.0)
MCHC: 34.6 g/dL (ref 30.0–36.0)
MCV: 88.1 fL (ref 78.0–100.0)
MONOS PCT: 1 %
Monocytes Absolute: 0.3 10*3/uL (ref 0.1–1.0)
NEUTROS PCT: 96 %
Neutro Abs: 30.3 10*3/uL — ABNORMAL HIGH (ref 1.7–7.7)
PLATELETS: 111 10*3/uL — AB (ref 150–400)
RBC: 3.18 MIL/uL — AB (ref 3.87–5.11)
RDW: 13.9 % (ref 11.5–15.5)
WBC: 31.5 10*3/uL — AB (ref 4.0–10.5)

## 2016-11-09 LAB — PROCALCITONIN: Procalcitonin: >175 ng/mL

## 2016-11-09 LAB — LACTIC ACID, PLASMA
Lactic Acid, Venous: 2.8 mmol/L (ref 0.5–1.9)
Lactic Acid, Venous: 1.8 mmol/L (ref 0.5–1.9)

## 2016-11-09 LAB — MAGNESIUM: MAGNESIUM: 2.4 mg/dL (ref 1.7–2.4)

## 2016-11-09 LAB — PHOSPHORUS: PHOSPHORUS: 4.3 mg/dL (ref 2.5–4.6)

## 2016-11-09 LAB — VANCOMYCIN, RANDOM: Vancomycin Rm: 8

## 2016-11-09 LAB — HEPARIN LEVEL (UNFRACTIONATED): Heparin Unfractionated: 0.35 IU/mL (ref 0.30–0.70)

## 2016-11-09 MED ORDER — PANTOPRAZOLE SODIUM 40 MG PO PACK
40.0000 mg | PACK | Freq: Every day | ORAL | Status: DC
  Administered 2016-11-10 – 2016-12-04 (×23): 40 mg
  Filled 2016-11-09 (×22): qty 20

## 2016-11-09 MED ORDER — FENTANYL CITRATE (PF) 100 MCG/2ML IJ SOLN
100.0000 ug | INTRAMUSCULAR | Status: DC | PRN
  Administered 2016-11-09 – 2016-11-11 (×7): 100 ug via INTRAVENOUS
  Filled 2016-11-09 (×7): qty 2

## 2016-11-09 MED ORDER — LACTATED RINGERS IV SOLN
INTRAVENOUS | Status: DC
  Administered 2016-11-09: 17:00:00 via INTRAVENOUS

## 2016-11-09 MED ORDER — ORAL CARE MOUTH RINSE
15.0000 mL | Freq: Four times a day (QID) | OROMUCOSAL | Status: DC
  Administered 2016-11-09 – 2016-11-10 (×5): 15 mL via OROMUCOSAL

## 2016-11-09 MED ORDER — ROCURONIUM BROMIDE 50 MG/5ML IV SOLN
5.0000 mg | Freq: Once | INTRAVENOUS | Status: DC
  Filled 2016-11-09: qty 0.5

## 2016-11-09 MED ORDER — MIDAZOLAM HCL 2 MG/2ML IJ SOLN
2.0000 mg | Freq: Once | INTRAMUSCULAR | Status: AC
  Administered 2016-11-09: 2 mg via INTRAVENOUS

## 2016-11-09 MED ORDER — ROCURONIUM BROMIDE 50 MG/5ML IV SOLN
50.0000 mg | Freq: Once | INTRAVENOUS | Status: AC
  Administered 2016-11-09: 50 mg via INTRAVENOUS
  Filled 2016-11-09: qty 5

## 2016-11-09 MED ORDER — LEVALBUTEROL HCL 0.63 MG/3ML IN NEBU
0.6300 mg | INHALATION_SOLUTION | Freq: Four times a day (QID) | RESPIRATORY_TRACT | Status: DC | PRN
  Administered 2016-11-09 – 2016-12-02 (×8): 0.63 mg via RESPIRATORY_TRACT
  Filled 2016-11-09 (×8): qty 3

## 2016-11-09 MED ORDER — DEXTROSE 5 % IV SOLN
0.0000 ug/min | INTRAVENOUS | Status: DC
  Administered 2016-11-09: 35 ug/min via INTRAVENOUS
  Administered 2016-11-10: 23 ug/min via INTRAVENOUS
  Administered 2016-11-10: 30 ug/min via INTRAVENOUS
  Administered 2016-11-11: 29.973 ug/min via INTRAVENOUS
  Administered 2016-11-11: 30 ug/min via INTRAVENOUS
  Administered 2016-11-11: 35 ug/min via INTRAVENOUS
  Administered 2016-11-12: 26 ug/min via INTRAVENOUS
  Filled 2016-11-09 (×7): qty 16

## 2016-11-09 MED ORDER — PROPOFOL 1000 MG/100ML IV EMUL
0.0000 ug/kg/min | INTRAVENOUS | Status: DC
  Administered 2016-11-09: 10 ug/kg/min via INTRAVENOUS
  Administered 2016-11-10: 15 ug/kg/min via INTRAVENOUS
  Administered 2016-11-11: 20 ug/kg/min via INTRAVENOUS
  Administered 2016-11-11: 22.154 ug/kg/min via INTRAVENOUS
  Administered 2016-11-11 (×2): 20 ug/kg/min via INTRAVENOUS
  Filled 2016-11-09 (×6): qty 100

## 2016-11-09 MED ORDER — HALOPERIDOL LACTATE 5 MG/ML IJ SOLN
5.0000 mg | Freq: Once | INTRAMUSCULAR | Status: AC
  Administered 2016-11-09: 5 mg via INTRAVENOUS

## 2016-11-09 MED ORDER — ETOMIDATE 2 MG/ML IV SOLN
20.0000 mg | Freq: Once | INTRAVENOUS | Status: AC
Start: 2016-11-09 — End: 2016-11-09
  Administered 2016-11-09: 20 mg via INTRAVENOUS

## 2016-11-09 MED ORDER — NOREPINEPHRINE BITARTRATE 1 MG/ML IV SOLN
0.0000 ug/min | INTRAVENOUS | Status: DC
  Filled 2016-11-09: qty 4

## 2016-11-09 MED ORDER — FENTANYL CITRATE (PF) 100 MCG/2ML IJ SOLN
100.0000 ug | INTRAMUSCULAR | Status: DC | PRN
  Filled 2016-11-09: qty 2

## 2016-11-09 MED ORDER — CHLORHEXIDINE GLUCONATE 0.12% ORAL RINSE (MEDLINE KIT)
15.0000 mL | Freq: Two times a day (BID) | OROMUCOSAL | Status: DC
  Administered 2016-11-09 – 2016-11-14 (×10): 15 mL via OROMUCOSAL

## 2016-11-09 MED ORDER — PIPERACILLIN-TAZOBACTAM IN DEX 2-0.25 GM/50ML IV SOLN
2.2500 g | Freq: Three times a day (TID) | INTRAVENOUS | Status: DC
  Administered 2016-11-09 – 2016-11-11 (×7): 2.25 g via INTRAVENOUS
  Filled 2016-11-09 (×8): qty 50

## 2016-11-09 MED ORDER — VANCOMYCIN HCL IN DEXTROSE 1-5 GM/200ML-% IV SOLN
1000.0000 mg | INTRAVENOUS | Status: AC
  Administered 2016-11-09: 1000 mg via INTRAVENOUS
  Filled 2016-11-09: qty 200

## 2016-11-09 MED ORDER — FENTANYL CITRATE (PF) 100 MCG/2ML IJ SOLN
INTRAMUSCULAR | Status: AC
  Filled 2016-11-09: qty 2

## 2016-11-09 MED ORDER — MIDAZOLAM HCL 2 MG/2ML IJ SOLN
INTRAMUSCULAR | Status: AC
  Administered 2016-11-09: 2 mg via INTRAVENOUS
  Filled 2016-11-09: qty 2

## 2016-11-09 MED ORDER — HALOPERIDOL LACTATE 5 MG/ML IJ SOLN: 5.0000 mg | Freq: Four times a day (QID) | INTRAMUSCULAR | Status: DC

## 2016-11-09 MED ORDER — FENTANYL CITRATE (PF) 100 MCG/2ML IJ SOLN
100.0000 ug | Freq: Once | INTRAMUSCULAR | Status: AC
  Administered 2016-11-09: 100 ug via INTRAVENOUS

## 2016-11-09 NOTE — Progress Notes (Signed)
Inpatient Diabetes Program Recommendations  AACE/ADA: New Consensus Statement on Inpatient Glycemic Control (2015)  Target Ranges:  Prepandial:   less than 140 mg/dL      Peak postprandial:   less than 180 mg/dL (1-2 hours)      Critically ill patients:  140 - 180 mg/dL   Results for XIADANI, DAMMAN (MRN [id number]) as of 11/09/2016 08:32  Ref. Range 11/08/2016 02:27 11/08/2016 16:07 11/09/2016 04:29  Glucose Latest Ref Range: 65 - 99 mg/dL 199 (H) 207 (H) 171 (H)    Admit with: Lung Cancer/ VATS      MD- Patient having occasional elevations of glucose levels on labs.  Please consider placing orders for CBG checks TID AC + HS and start Novolog Sensitive Correction Scale/ SSI (0-9 units) TID AC + HS if CBGs are elevated     --Will follow patient during hospitalization--  Wyn Quaker RN, MSN, CDE Diabetes Coordinator Inpatient Glycemic Control Team Team Pager: [phone] (8a-5p)

## 2016-11-09 NOTE — Progress Notes (Signed)
eLink Physician-Brief Progress Note Patient Name: [name] DOB: May 18, 1946 MRN: [id number]   Date of Service  11/09/2016  HPI/Events of Note  SUP  eICU Interventions  protonix     Intervention Category Intermediate Interventions: Best-practice therapies (e.g. DVT, beta blocker, etc.)  ALVA,RAKESH V. 11/09/2016, 3:52 PM

## 2016-11-09 NOTE — Procedures (Signed)
Intubation Procedure Note [name] [id number] [birth year]  Procedure: Intubation Indications: Airway protection and maintenance  Procedure Details Consent: Risks of procedure as well as the alternatives and risks of each were explained to the (patient/caregiver).  Consent for procedure obtained. Time Out: Verified patient identification, verified procedure, site/side was marked, verified correct patient position, special equipment/implants available, medications/allergies/relevent history reviewed, required imaging and test results available.  Performed  [name] and 3   Evaluation Hemodynamic Status: BP stable throughout; O2 sats: stable throughout Patient's Current Condition: stable Complications: No apparent complications Patient did tolerate procedure well. Chest X-ray ordered to verify placement.  CXR: pending.   [name] 11/09/2016

## 2016-11-09 NOTE — Progress Notes (Signed)
PULMONARY / CRITICAL CARE MEDICINE   Name: [name] MRN: [id number] DOB: 08/02/1946    ADMISSION DATE:  10/29/2016 CONSULTATION DATE:  11/07/2016  REFERRING MD:  Lanelle Bal, M.D. / CVTS  CHIEF COMPLAINT:  Acute Hypoxic Respiratory Failure  Brief:  71 y.o. 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 Patient did undergo endobronchial valve replacement on 1/22 for her persistent air leak. Has previously been ambulatory in the ICU and out of bed without difficulty. Patient has been receiving oxycodone for pain associated with her right chest tubes and postoperative pain. Patient did receive Phenergan this morning for nausea and did also have subsequent emesis. She had worsening hypoxia and respiratory status prompting initiation of BiPAP support and PCCM consultation.  SUBJECTIVE: Currently on Vaso, Neo, and Levophed (Currently weaning), Bipap required overnight, currently on 15L [place]   VITAL SIGNS: BP 111/70   Pulse (!) 115   Temp 98.6 F (37 C) (Oral)   Resp (!) 30   Ht 5' 3.5" (1.613 m)   Wt 64.7 kg (142 lb 10.2 oz)   SpO2 96%   BMI 24.87 kg/m   HEMODYNAMICS: CVP:  [5 mmHg-18 mmHg] 11 mmHg  VENTILATOR SETTINGS: Vent Mode: BIPAP FiO2 (%):  [40 %-100 %] 40 % PEEP:  [5 cmH20] 5 cmH20 Pressure Support:  [5 cmH20] 5 cmH20  INTAKE / OUTPUT: I/O last 3 completed shifts: In: 8.1 [P.O.:120; I.V.:5740.1; IV Piggyback:777] Out: 1956 [Urine:1405; Stool:1; Chest Tube:550]  PHYSICAL EXAMINATION: General: Adult female, lying in bed, no distress  Skin: Warm, dry, intact   HEENT: Normocephalic  Cardiovascular: Afib, Tachy, no MRG, NI S1/S2  Pulmonary: labored breathing, rhonchi throughout   Abdomen: Soft, non-tender, active bowel sounds  Musculoskeletal: no deformities  Neurological: Alert, oriented, follows commands, moves all extremities   LABS: CBC Latest Ref Rng & Units 11/09/2016 11/08/2016  11/08/2016  WBC 4.0 - 10.5 K/uL 31.5(H) 40.3(H) 44.8(H)  Hemoglobin 12.0 - 15.0 g/dL 9.7(L) 11.5(L) 12.5  Hematocrit 36.0 - 46.0 % 28.0(L) 33.4(L) 35.6(L)  Platelets 150 - 400 K/uL 111(L) 166 203   BMP Latest Ref Rng & Units 11/09/2016 11/08/2016 11/08/2016  Glucose 65 - 99 mg/dL 171(H) 207(H) 199(H)  BUN 6 - 20 mg/dL 37(H) 32(H) 26(H)  Creatinine 0.44 - 1.00 mg/dL 2.44(H) 1.99(H) 1.75(H)  Sodium 135 - 145 mmol/L 125(L) 124(L) 129(L)  Potassium 3.5 - 5.1 mmol/L 3.7 4.3 3.8  Chloride 101 - 111 mmol/L 97(L) 92(L) 94(L)  CO2 22 - 32 mmol/L 19(L) 19(L) 19(L)  Calcium 8.9 - 10.3 mg/dL 5.8(LL) 6.6(L) 7.4(L)    STUDIES:  PFT 08/24/16: FVC 2.54 L (89%) FEV1 1.22 L (57%) FEV1/FVC 0.48 FEF 25-75 0.44 L (24%) negative bronchial dilator response TLC 5.19 L (120%) RV 155% ERV 76% DLCO uncorrected 52% PORT CXR 1/24:  Previously reviewed by me. No new left opacity. No appreciated pleural effusion. Right basilar opacity relatively unchanged but need be slightly worsened. Right-sided chest tubes remain in place.  TTE 1/24:  Poor quality study. Unable to assess RV. No pericardial effusion. LV with mild LVH but normal in size. EF 60-65%. Unable to assess wall motion. Grade 1 diastolic dysfunction. BILAT LE VENOUS DUPLEX 1/24:  There is evidence of acute deep vein thrombosis involving the peroneal veins of the right and left lower extremities.  MICROBIOLOGY: MRSA PCR 1/11:  Negative Right Bronchial Washing 1/22:  Negative Blood Ctx x2 1/24 >> Urine Ctx 1/24 >>  ANTIBIOTICS: Vancomycin  1/24 >> Zosyn 1/24 >>  SIGNIFICANT EVENTS: 1/15 - Admit w/ RUL Lobectomy 1/22 - Bronchoscopy with 3 right-sided endobronchial valves for persistent air leak  LINES/TUBES: Right Chest Tubes 1/15 >> R IJ CLV 1/24 >> Foley 1/24 >> PIV  ASSESSMENT / PLAN:  71 y.o. female admitted for right upper lobectomy completed on 1/15. Patient had subsequent persistent air leak. Patient's shock and acute hypoxic respiratory failure  likely due to evolving sepsis. Source likely from her lung given recent surgery. No other source identified at this time. No indication infection for stress dose steroids. Continuing amiodarone and systemic anticoagulation.  PULMONARY A: Acute Hypoxic Respiratory Failure secondary tp PNA Persistent Right Pneumothorax & Air Leak Secondary to lobectomy  -Chest tubes in place & endobronchial valves placed 1/22 Moderate - Severe COPD P:   Maintain oxygen saturation > 90 Follow CXR  BiPAP PRN  Chest Tube per CVTS  Continuing Xopenex & Atrovent nebs q6hr scheduled  CARDIOVASCULAR A:  Septic Shock likely secondary to PNA Afib with RVR  Bilateral Calf Vein/Peroneal DVT H/O HTN, HLD  P:  Management per CVTS Continue Zocor  Amiodarone gtt  Heparin gtt  Maintain MAP > 65 or Systolic > 90  D/C Neo and Titrate up Levophed, Leave Vasopressin on until Levophed wean down to 25   RENAL A:   Acute Renal Failure  -likely secondary to septic shock  Hypomagnesemia Hyponatremia  P:  Trend BMP D5 1/2 NS @ 100 ml/hr  Replace electrolytes as needed  Renal U/S pending  Send Ionized Calcium  Avoid Nephrotoxic Medications   GASTROINTESTINAL A:   Nausea and Vomiting - resolving  P:   CL diet   HEMATOLOGIC A:   Anticoagulation secondary to DVT  Thrombocytopenia  P: Continuing on heparin drip per pharmacy protocol Monitor hemoglobin daily with CBC  INFECTIOUS A:   Septic Shock secondary to PNA  -Procal 175, lactic acid 2.8  Leucocytosis  P:   Trend Procal and Lactic Acid  Trend Fever and WBC curve Continue Zosyn and Vanc  Follow cultures   ENDOCRINE A:   Hyperglycemia  P:   Q4H glucose checks   NEUROLOGIC A:   Acute Encephalopathy - improving  P:   Monitor  Haldol PRN  RASS goal: 0    FAMILY  - Updates: Family updated at bedside 1/26   - Inter-disciplinary family meet or Palliative Care meeting due by:  Ongoing     Hayden Pedro, AG-ACNP [place]  [facility]  Pgr: [phone]  PCCM Pgr: [phone]

## 2016-11-09 NOTE — Progress Notes (Signed)
eLink Physician-Brief Progress Note Patient Name: [name] DOB: January 19, 1946 MRN: [id number]   Date of Service  11/09/2016  HPI/Events of Note  Onset of loose watery diarrhea.  Has been on laxatives.  WBC is elevated  eICU Interventions  Plan: Rectal tube to prevent excoriation of rectal area D/C laxatives/stool sofners Consider C Diff check if diarrhea persists     Intervention Category Intermediate Interventions: Other:  DETERDING,ELIZABETH 11/09/2016, 1:23 AM

## 2016-11-09 NOTE — Progress Notes (Signed)
Pharmacy Antibiotic Note  [name] is a 71 y.o. female  with likely sepsis on vancomycin/zosyn - Day #3. Pt with worsening renal function, SCr up to 2.44, est CrCl 19 ml/min. UOP ~1000 ml/24h.  Random vanc level today is 8 mcg/ml (subtherapeutic). Last dose was '500mg'$  given 1/25 ~0030.   Plan: -Vancomycin 1gm IV now -Change Zosyn to 2.25gm IV q8h -Will follow renal function, cultures and clinical progress   Height: 5' 3.5" (161.3 cm) Weight: 142 lb 10.2 oz (64.7 kg) IBW/kg (Calculated) : 53.55  Temp (24hrs), Avg:99.7 F (37.6 C), Min:98.2 F (36.8 C), Max:102.9 F (39.4 C)   Recent Labs Lab 11/05/16 0247  11/07/16 0810  11/07/16 1717 11/08/16 0022 11/08/16 0227 11/08/16 0432 11/08/16 1607 11/08/16 2012 11/09/16 0429 11/09/16 0430 11/09/16 0431  WBC 8.4  --  6.8  --   --   --   --  44.8* 40.3*  --  31.5*  --   --   CREATININE 0.61  --  0.79  --   --   --  1.75*  --  1.99*  --  2.44*  --   --   LATICACIDVEN  --   < >  --   < > 3.3* 4.5*  --   --  2.7* 2.1*  --   --  2.8*  VANCORANDOM  --   --   --   --   --   --   --   --   --   --   --  8  --   < > = values in this interval not displayed.  Estimated Creatinine Clearance: 19.4 mL/min (by C-G formula based on SCr of 2.44 mg/dL (H)).    Allergies  Allergen Reactions  . Nickel Rash    Antimicrobials this admission: 1/24 vanc 1/24 zosyn   Microbiology results: 1/24 urine-neg 1/24 blood x2 1/22 resp- neg  Thank you for allowing pharmacy to be a part of this patient's care.  Sherlon Handing, PharmD, BCPS Clinical pharmacist, pager [phone]  11/09/2016 5:18 AM

## 2016-11-09 NOTE — Progress Notes (Signed)
Called to bedside to assess the patient.  Pt with RUL lobectomy for non small cell lung cancer. Post op course complicated by persistent air leak s/p endobronchial valve. She has persistent resp distress, delirium We tried Bipap, haldol briefly but she did not turn around.  Blood pressure 104/63, pulse (!) 112, temperature 98.4 F (36.9 C), temperature source Oral, resp. rate (!) 37, height '5\' 5"'$  (1.651 m), weight 142 lb 10.2 oz (64.7 kg), SpO2 99 %. Gen:      No acute distress HEENT:  EOMI, sclera anicteric Neck:     No masses; no thyromegaly Lungs:    Clear to auscultation bilaterally; normal respiratory effort CV:         Regular rate and rhythm; no murmurs Abd:      + bowel sounds; soft, non-tender; no palpable masses, no distension Ext:    No edema; adequate peripheral perfusion Skin:      Warm and dry; no rash Neuro: alert and oriented x 3 Psych: normal mood and affect  Assessment/Plan: RUL lobectomy Non small cell cancer Perisitent air leak. Sepsis with delirium  Discussed with Dr. Servando Snare and family We will proceed with intubation Further discussions with family after son gets here from [place]. She has a living will that states ok for short term intubation but for for prolonged life support.  Additional critical care time- 30 mins.  Marshell Garfinkel MD [facility] Pager [phone] If no answer or after 3pm call: [phone] 11/09/2016, 3:33 PM

## 2016-11-09 NOTE — Progress Notes (Signed)
Patient ID: [name], female   DOB: [birth year], 71 y.o.   MRN: [id number] TCTS DAILY ICU PROGRESS NOTE                   [address]            [place] [address]          [phone]   4 Days Post-Op Procedure(s) (LRB): VIDEO BRONCHOSCOPY WITH INSERTION OF INTERBRONCHIAL VALVE (IBV) times three (N/A)  Total Length of Stay:  LOS: 11 days   Subjective: Increasing confusion, on mask currently   Objective: Vital signs in last 24 hours: Temp:  [98.2 F (36.8 C)-102.9 F (39.4 C)] 98.6 F (37 C) (01/26 0724) Pulse Rate:  [72-173] 110 (01/26 0723) Cardiac Rhythm: Sinus tachycardia (01/26 0400) Resp:  [18-41] 37 (01/26 0723) BP: (62-129)/(33-86) 97/60 (01/26 0645) SpO2:  [71 %-100 %] 95 % (01/26 0723) Arterial Line BP: (85-128)/(27-49) 123/49 (01/26 0645) FiO2 (%):  [40 %-100 %] 40 % (01/26 0400)  Filed Weights   10/31/16 0500 11/01/16 0700 11/06/16 0400  Weight: 143 lb 4.8 oz (65 kg) 140 lb 6.9 oz (63.7 kg) 142 lb 10.2 oz (64.7 kg)    Weight change:    Hemodynamic parameters for last 24 hours: CVP:  [5 mmHg-18 mmHg] 9 mmHg  Intake/Output from previous day: 01/25 0701 - 01/26 0700 In: 3881.8 [P.O.:120; I.V.:2984.8; IV Piggyback:777] Out: 1416 [Urine:1075; Stool:1; Chest Tube:340]  Intake/Output this shift: No intake/output data recorded.  Current Meds: Scheduled Meds: . guaiFENesin  600 mg Oral BID  . ipratropium  0.5 mg Nebulization Q6H  . levalbuterol  0.63 mg Nebulization Q6H  . mouth rinse  15 mL Mouth Rinse BID  . piperacillin-tazobactam (ZOSYN)  IV  2.25 g Intravenous Q8H  . sodium chloride flush  10-40 mL Intracatheter Q12H  . sodium chloride flush  10-40 mL Intracatheter Q12H   Continuous Infusions: . sodium chloride 10 mL/hr at 11/03/16 0400  . amiodarone 30 mg/hr (11/09/16 0700)  . dextrose 5 % and 0.45% NaCl 100 mL/hr at 11/09/16 0700  . heparin 1,300 Units/hr (11/09/16 0604)  . norepinephrine (LEVOPHED) Adult infusion 16  mcg/min (11/09/16 0700)  . phenylephrine (NEO-SYNEPHRINE) Adult infusion 10 mcg/min (11/09/16 0700)  . vasopressin (PITRESSIN) infusion - *FOR SHOCK* 0.04 Units/min (11/09/16 0700)   PRN Meds:.Place/Maintain arterial line **AND** sodium chloride, acetaminophen, albuterol, haloperidol lactate, potassium chloride (KCL MULTIRUN) 30 mEq in 265 mL IVPB, sodium chloride flush, sodium chloride flush, traMADol  General appearance: combative, delirious, distracted and mild distress Neurologic: intact Heart: irregularly irregular rhythm Lungs: rhonchi bilaterally Abdomen: soft, non-tender; bowel sounds normal; no masses,  no organomegaly Extremities: extremities normal, atraumatic, no cyanosis or edema and Homans sign is negative, no sign of DVT Wound: air  leak persistent , appears decreased, sub q air less  Lab Results: CBC: Recent Labs  11/08/16 1607 11/09/16 0429  WBC 40.3* 31.5*  HGB 11.5* 9.7*  HCT 33.4* 28.0*  PLT 166 111*   BMET:  Recent Labs  11/08/16 1607 11/09/16 0429  NA 124* 125*  K 4.3 3.7  CL 92* 97*  CO2 19* 19*  GLUCOSE 207* 171*  BUN 32* 37*  CREATININE 1.99* 2.44*  CALCIUM 6.6* 5.8*    CMET: Lab Results  Component Value Date   WBC 31.5 (H) 11/09/2016   HGB 9.7 (L) 11/09/2016   HCT 28.0 (L) 11/09/2016   PLT 111 (L) 11/09/2016   GLUCOSE 171 (H) 11/09/2016   ALT  31 11/09/2016   AST 39 11/09/2016   NA 125 (L) 11/09/2016   K 3.7 11/09/2016   CL 97 (L) 11/09/2016   CREATININE 2.44 (H) 11/09/2016   BUN 37 (H) 11/09/2016   CO2 19 (L) 11/09/2016   INR 0.99 10/25/2016      PT/INR: No results for input(s): LABPROT, INR in the last 72 hours. Radiology: Dg Chest Port 1 View  Result Date: 11/08/2016 CLINICAL DATA:  Respiratory failure EXAM: PORTABLE CHEST 1 VIEW COMPARISON:  11/08/2016 FINDINGS: Cardiac shadow is stable. Right jugular line and 2 right thoracostomy catheters are again noted and stable. Small pneumothorax is seen mildly improved when compared  with the prior study. Subcutaneous air is noted on the right. The left lung remains clear. Multiple intrabronchial valves are noted on the right stable from the prior exam. IMPRESSION: Slight improvement in right pneumothorax. The remainder of the exam is stable from the prior study. Electronically Signed   By: Inez Catalina M.D.   On: 11/08/2016 16:30   Dg Chest Port 1 View  Result Date: 11/08/2016 CLINICAL DATA:  Follow-up pneumothorax on the right with chest tube treatment. Recent surgery for right lung malignancy. History of asthma -COPD EXAM: PORTABLE CHEST 1 VIEW COMPARISON:  Portable chest x-ray dated November 07, 2016. FINDINGS: The small right apical pneumothorax persists. It amounts to 10-15% of the lung volume. The right apical chest tube has its tip in the medial aspect of the second and third rib interspace posteriorly. There is a trace of pleural fluid on the right. There is subcutaneous emphysema in the axillary region. The left lung is hyperinflated with shift of the mediastinum toward the right which is stable. There is calcification in the wall of the aortic arch. The cardiac silhouette is normal in size. The pulmonary vascularity is not engorged. IMPRESSION: Fairly stable 10-15% right apical pneumothorax. The chest tube is in stable position. Thoracic aortic atherosclerosis. Electronically Signed   By: David  Martinique M.D.   On: 11/08/2016 08:43     Assessment/Plan: S/P Procedure(s) (LRB): VIDEO BRONCHOSCOPY WITH INSERTION OF INTERBRONCHIAL VALVE (IBV) times three (N/A) Mobilize Needs nutrition soon Leave chest tubes  Continue treatment for sepsis per ccm    [name] 11/09/2016 7:42 AM

## 2016-11-09 NOTE — Progress Notes (Signed)
ANTICOAGULATION CONSULT NOTE - Follow Up Consult  Pharmacy Consult for Heparin Indication: DVT  Allergies  Allergen Reactions  . Nickel Rash    Patient Measurements: Height: 5' 3.5" (161.3 cm) Weight: 142 lb 10.2 oz (64.7 kg) IBW/kg (Calculated) : 53.55 Heparin Dosing Weight:    Vital Signs: Temp: 98.6 F (37 C) (01/26 0724) Temp Source: Oral (01/26 0724) BP: 112/49 (01/26 1000) Pulse Rate: 110 (01/26 1000)  Labs:  Recent Labs  11/07/16 0855 11/07/16 1415  11/08/16 0227 11/08/16 0432 11/08/16 1136 11/08/16 1607 11/08/16 2000 11/09/16 0429 11/09/16 0430  HGB  --   --   --   --  12.5  --  11.5*  --  9.7*  --   HCT  --   --   --   --  35.6*  --  33.4*  --  28.0*  --   PLT  --   --   --   --  203  --  166  --  111*  --   HEPARINUNFRC  --   --   < > 0.13*  --  0.41  --  0.32  --  0.35  CREATININE  --   --   --  1.75*  --   --  1.99*  --  2.44*  --   TROPONINI <0.03 <0.03  --   --   --   --   --   --   --   --   < > = values in this interval not displayed.  Estimated Creatinine Clearance: 19.4 mL/min (by C-G formula based on SCr of 2.44 mg/dL (H)).  Assessment:  Anticoag: venous duplex w/ bilateral peroneal DVT on heparin + afib.  -HL= 0.35. Hgb 12.5>11.5>9.1. Plts 200's now 111. MD aware.  Goal of Therapy:  Heparin level 0.3-0.7 units/ml Monitor platelets by anticoagulation protocol: Yes   Plan:  - Heparin at 1300 units/h.  - Daily heparin level/CBC.   - Monitor for s/sx bleeding    Jahnavi Muratore S. Alford Highland, PharmD, [facility] Clinical Staff Pharmacist Pager [phone]  Eilene Ghazi Stillinger 11/09/2016,10:44 AM

## 2016-11-09 NOTE — Progress Notes (Signed)
Patient ID: [name], female   DOB: 11/26/1945, 71 y.o.   MRN: [id number] EVENING ROUNDS NOTE :     [address]       [place] [address]             [phone]                 4 Days Post-Op Procedure(s) (LRB): VIDEO BRONCHOSCOPY WITH INSERTION OF INTERBRONCHIAL VALVE (IBV) times three (N/A)  Total Length of Stay:  LOS: 11 days  BP (!) 89/48   Pulse 91   Temp (!) 100.6 F (38.1 C) (Oral)   Resp (!) 24   Ht '5\' 5"'$  (1.651 m)   Wt 142 lb 10.2 oz (64.7 kg)   SpO2 96%   BMI 24.87 kg/m   .Intake/Output      01/26 0701 - 01/27 0700   P.O.    I.V. (mL/kg) 2778.7 (42.9)   NG/GT 30   IV Piggyback 50   Total Intake(mL/kg) 2858.7 (44.2)   Urine (mL/kg/hr) 590 (0.7)   Emesis/NG output 150 (0.2)   Stool    Chest Tube    Total Output 740   Net +2118.7         . sodium chloride 10 mL/hr at 11/09/16 2000  . amiodarone 30 mg/hr (11/09/16 2000)  . heparin 1,300 Units/hr (11/09/16 2000)  . lactated ringers 10 mL/hr at 11/09/16 2000  . norepinephrine (LEVOPHED) Adult infusion 35 mcg/min (11/09/16 2006)  . phenylephrine (NEO-SYNEPHRINE) Adult infusion Stopped (11/09/16 1700)  . propofol (DIPRIVAN) infusion 15 mcg/kg/min (11/09/16 2000)  . vasopressin (PITRESSIN) infusion - *FOR SHOCK* 0.04 Units/min (11/09/16 2000)     Lab Results  Component Value Date   WBC 31.5 (H) 11/09/2016   HGB 9.7 (L) 11/09/2016   HCT 28.0 (L) 11/09/2016   PLT 111 (L) 11/09/2016   GLUCOSE 171 (H) 11/09/2016   ALT 31 11/09/2016   AST 39 11/09/2016   NA 125 (L) 11/09/2016   K 3.7 11/09/2016   CL 97 (L) 11/09/2016   CREATININE 2.44 (H) 11/09/2016   BUN 37 (H) 11/09/2016   CO2 19 (L) 11/09/2016   INR 0.99 10/25/2016   Now entubated  Drips decreased Continue supportive care Well need feeding tube  [name] MD  Beeper [phone] Office [phone] 11/09/2016 8:59 PM

## 2016-11-09 NOTE — Progress Notes (Signed)
CRITICAL VALUE ALERT  Critical value received:  Calcium 5.8 and Lactic Acid 2.8  Date of notification:  11/09/16  Time of notification:  0511  Critical value read back: Yes  Nurse who received alert: Naaman Plummer. RN  MD notified (1st page):  E-link Elzie Rings) @ [phone]

## 2016-11-10 ENCOUNTER — Inpatient Hospital Stay (HOSPITAL_COMMUNITY): Payer: Medicare Other

## 2016-11-10 DIAGNOSIS — Z9689 Presence of other specified functional implants: Secondary | ICD-10-CM

## 2016-11-10 LAB — POCT I-STAT 3, ART BLOOD GAS (G3+)
Acid-base deficit: 4 mmol/L — ABNORMAL HIGH (ref 0.0–2.0)
Acid-base deficit: 7 mmol/L — ABNORMAL HIGH (ref 0.0–2.0)
Acid-base deficit: 7 mmol/L — ABNORMAL HIGH (ref 0.0–2.0)
Acid-base deficit: 8 mmol/L — ABNORMAL HIGH (ref 0.0–2.0)
Bicarbonate: 16.6 mmol/L — ABNORMAL LOW (ref 20.0–28.0)
Bicarbonate: 17.6 mmol/L — ABNORMAL LOW (ref 20.0–28.0)
Bicarbonate: 17.6 mmol/L — ABNORMAL LOW (ref 20.0–28.0)
Bicarbonate: 21.7 mmol/L (ref 20.0–28.0)
O2 Saturation: 100 %
O2 Saturation: 96 %
O2 Saturation: 96 %
O2 Saturation: 98 %
Patient temperature: 100.8
Patient temperature: 97.7
Patient temperature: 98.3
TCO2: 18 mmol/L (ref 0–100)
TCO2: 19 mmol/L (ref 0–100)
TCO2: 19 mmol/L (ref 0–100)
TCO2: 23 mmol/L (ref 0–100)
pCO2 arterial: 31 mmHg — ABNORMAL LOW (ref 32.0–48.0)
pCO2 arterial: 31.2 mmHg — ABNORMAL LOW (ref 32.0–48.0)
pCO2 arterial: 31.6 mmHg — ABNORMAL LOW (ref 32.0–48.0)
pCO2 arterial: 43.7 mmHg (ref 32.0–48.0)
pH, Arterial: 7.311 — ABNORMAL LOW (ref 7.350–7.450)
pH, Arterial: 7.338 — ABNORMAL LOW (ref 7.350–7.450)
pH, Arterial: 7.354 (ref 7.350–7.450)
pH, Arterial: 7.357 (ref 7.350–7.450)
pO2, Arterial: 101 mmHg (ref 83.0–108.0)
pO2, Arterial: 234 mmHg — ABNORMAL HIGH (ref 83.0–108.0)
pO2, Arterial: 84 mmHg (ref 83.0–108.0)
pO2, Arterial: 85 mmHg (ref 83.0–108.0)

## 2016-11-10 LAB — COMPREHENSIVE METABOLIC PANEL
ALT: 32 U/L (ref 14–54)
AST: 32 U/L (ref 15–41)
Albumin: 1.5 g/dL — ABNORMAL LOW (ref 3.5–5.0)
Alkaline Phosphatase: 77 U/L (ref 38–126)
Anion gap: 11 (ref 5–15)
BUN: 47 mg/dL — ABNORMAL HIGH (ref 6–20)
CO2: 18 mmol/L — ABNORMAL LOW (ref 22–32)
Calcium: 5.7 mg/dL — CL (ref 8.9–10.3)
Chloride: 95 mmol/L — ABNORMAL LOW (ref 101–111)
Creatinine, Ser: 2.54 mg/dL — ABNORMAL HIGH (ref 0.44–1.00)
GFR calc Af Amer: 21 mL/min — ABNORMAL LOW (ref >60)
GFR calc non Af Amer: 18 mL/min — ABNORMAL LOW (ref >60)
Glucose, Bld: 150 mg/dL — ABNORMAL HIGH (ref 65–99)
Potassium: 3.3 mmol/L — ABNORMAL LOW (ref 3.5–5.1)
Sodium: 124 mmol/L — ABNORMAL LOW (ref 135–145)
Total Bilirubin: 0.7 mg/dL (ref 0.3–1.2)
Total Protein: 3.8 g/dL — ABNORMAL LOW (ref 6.5–8.1)

## 2016-11-10 LAB — GLUCOSE, CAPILLARY
Glucose-Capillary: 139 mg/dL — ABNORMAL HIGH (ref 65–99)
Glucose-Capillary: 119 mg/dL — ABNORMAL HIGH (ref 65–99)
Glucose-Capillary: 118 mg/dL — ABNORMAL HIGH (ref 65–99)
Glucose-Capillary: 116 mg/dL — ABNORMAL HIGH (ref 65–99)
Glucose-Capillary: 142 mg/dL — ABNORMAL HIGH (ref 65–99)

## 2016-11-10 LAB — CBC
HCT: 28.1 % — ABNORMAL LOW (ref 36.0–46.0)
Hemoglobin: 9.9 g/dL — ABNORMAL LOW (ref 12.0–15.0)
MCH: 30.4 pg (ref 26.0–34.0)
MCHC: 35.2 g/dL (ref 30.0–36.0)
MCV: 86.2 fL (ref 78.0–100.0)
Platelets: 79 10*3/uL — ABNORMAL LOW (ref 150–400)
RBC: 3.26 MIL/uL — ABNORMAL LOW (ref 3.87–5.11)
RDW: 14.1 % (ref 11.5–15.5)
WBC: 26.1 10*3/uL — ABNORMAL HIGH (ref 4.0–10.5)

## 2016-11-10 LAB — HEPARIN LEVEL (UNFRACTIONATED): Heparin Unfractionated: 0.44 IU/mL (ref 0.30–0.70)

## 2016-11-10 LAB — MAGNESIUM
MAGNESIUM: 2.5 mg/dL — AB (ref 1.7–2.4)
MAGNESIUM: 2.6 mg/dL — AB (ref 1.7–2.4)

## 2016-11-10 LAB — PHOSPHORUS
PHOSPHORUS: 5 mg/dL — AB (ref 2.5–4.6)
Phosphorus: 4.2 mg/dL (ref 2.5–4.6)

## 2016-11-10 MED ORDER — PRO-STAT SUGAR FREE PO LIQD
30.0000 mL | Freq: Two times a day (BID) | ORAL | Status: DC
  Administered 2016-11-10 – 2016-11-17 (×12): 30 mL
  Filled 2016-11-10 (×12): qty 30

## 2016-11-10 MED ORDER — ORAL CARE MOUTH RINSE
15.0000 mL | OROMUCOSAL | Status: DC
  Administered 2016-11-11 – 2016-11-14 (×40): 15 mL via OROMUCOSAL

## 2016-11-10 MED ORDER — VITAL HIGH PROTEIN PO LIQD
1000.0000 mL | ORAL | Status: DC
  Administered 2016-11-10: 1000 mL
  Administered 2016-11-11 (×3)
  Administered 2016-11-11: 1000 mL
  Administered 2016-11-11 – 2016-11-13 (×17)
  Administered 2016-11-13: 1000 mL
  Administered 2016-11-13 (×2)

## 2016-11-10 MED ORDER — VITAL HIGH PROTEIN PO LIQD: 1000.0000 mL | ORAL | Status: DC

## 2016-11-10 MED ORDER — POTASSIUM CHLORIDE 20 MEQ/15ML (10%) PO SOLN
40.0000 meq | Freq: Once | ORAL | Status: AC
  Administered 2016-11-10: 40 meq
  Filled 2016-11-10: qty 30

## 2016-11-10 MED ORDER — SODIUM CHLORIDE 0.9 % IV SOLN
1.0000 g | Freq: Once | INTRAVENOUS | Status: AC
  Administered 2016-11-10: 1 g via INTRAVENOUS
  Filled 2016-11-10: qty 10

## 2016-11-10 NOTE — Progress Notes (Signed)
eLink Physician-Brief Progress Note Patient Name: [name] DOB: Feb 28, 1946 MRN: [id number]   Date of Service  11/10/2016  HPI/Events of Note  Hypokalemia and hypocalcemia  eICU Interventions  Potassium ordered PRN already for hypokalemia Calcium replacement ordered     Intervention Category Intermediate Interventions: Electrolyte abnormality - evaluation and management  [name] 11/10/2016, 5:05 AM

## 2016-11-10 NOTE — Progress Notes (Signed)
Initial Nutrition Assessment  DOCUMENTATION CODES:   Not applicable  INTERVENTION:  TF: recommend Vital High Protein at goal rate of 50 ml/hr with Prostat BID providing 136 g of protein, 1400 kcals and 1008 mL of free water. Meets 100% estimated calorie and protein needs. PEPuP ordered. Cortrak tube to be placed. Monitor electrolytes and make recommendations to change in TF formula if needed. Contiue to assess   NUTRITION DIAGNOSIS:   Inadequate oral intake related to acute illness as evidenced by NPO status.  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 Postop course complicated by persistent air leak s/p endobronchial valve   Pt with persistent respiratory distress, delerium. No improvement with Bipap and pt intubated on 11/10/15  Patient is currently intubated on ventilator support MV: 4.6 L/min Temp (24hrs), Avg:99 F (37.2 C), Min:97.4 F (36.3 C), Max:100.8 F (38.2 C)  Propofol: 5.8 ml/hr (153 kcals in 24 hours at current rate)  NG-LIS, abdomen soft (350 mL out previous 24 hours) Order for Cortrak tube  Rectal tube in place (50 mL documented)  Noted weight trend up over this admission; per I/O flowsheet, pt net +4 L (equivalent to 8.8 pounds)  Labs: sodium 124, potassium 3.3 (supplemented), albumin 1.5, corrected calcium 7.7 (supplemented), phosphorus 5.0, magnesium 2.5, Creatinine 2.54 Meds: diprivan, versed, fentanyl, levophed and vasopressin  Diet Order:  Diet NPO time specified  Skin:  Reviewed, no issues  Last BM:  11/10/15  Height:   Ht Readings from Last 1 Encounters:  11/09/16 '5\' 5"'$  (1.651 m)    Weight: based on weight history and weight trend. I/O this admission; will utilize dry weight of 64 kg  Wt Readings from Last 1 Encounters:  11/10/16 158 lb 1.1 oz (71.7  kg)    Filed Weights   11/01/16 0700 11/06/16 0400 11/10/16 0600  Weight: 140 lb 6.9 oz (63.7 kg) 142 lb 10.2 oz (64.7 kg) 158 lb 1.1 oz (71.7 kg)    BMI:  Body mass index is 26.3 kg/m.  Estimated Nutritional Needs:   Kcal:  1419 kcals  Protein:  108-143 g  Fluid:  >/= 1.5 L  EDUCATION NEEDS:   No education needs identified at this time  [place], [place], [place] [phone] Pager  [phone] Weekend/On-Call Pager

## 2016-11-10 NOTE — Progress Notes (Signed)
Patient ID: [name], female   DOB: May 16, 1946, 71 y.o.   MRN: [id number] TCTS DAILY ICU PROGRESS NOTE                   [address]            [place] [address]          [phone]   5 Days Post-Op Procedure(s) (LRB): VIDEO BRONCHOSCOPY WITH INSERTION OF INTERBRONCHIAL VALVE (IBV) times three (N/A)  Total Length of Stay:  LOS: 12 days   Subjective: Sedated on vent , but responds to name  Objective: Vital signs in last 24 hours: Temp:  [97.4 F (36.3 C)-100.6 F (38.1 C)] 97.4 F (36.3 C) (01/27 0915) Pulse Rate:  [32-104] 61 (01/27 1330) Cardiac Rhythm: Normal sinus rhythm (01/27 1130) Resp:  [8-32] 24 (01/27 1330) BP: (83-135)/(41-70) 97/54 (01/27 1300) SpO2:  [90 %-100 %] 99 % (01/27 1520) Arterial Line BP: (87-159)/(39-73) 135/55 (01/27 1130) FiO2 (%):  [45 %-100 %] 45 % (01/27 1520) Weight:  [158 lb 1.1 oz (71.7 kg)] 158 lb 1.1 oz (71.7 kg) (01/27 0600)  Filed Weights   11/01/16 0700 11/06/16 0400 11/10/16 0600  Weight: 140 lb 6.9 oz (63.7 kg) 142 lb 10.2 oz (64.7 kg) 158 lb 1.1 oz (71.7 kg)    Weight change:    Hemodynamic parameters for last 24 hours: CVP:  [10 mmHg-14 mmHg] 10 mmHg  Intake/Output from previous day: 01/26 0701 - 01/27 0700 In: 4003.9 [I.V.:3733.9; NG/GT:60; IV Piggyback:210] Out: 1550 [Urine:1000; Emesis/NG output:350; Stool:50; Chest Tube:150]  Intake/Output this shift: Total I/O In: 480.2 [I.V.:450.2; NG/GT:30] Out: 775 [Urine:575; Emesis/NG output:100; Chest Tube:100]  Current Meds: Scheduled Meds: . chlorhexidine gluconate (MEDLINE KIT)  15 mL Mouth Rinse BID  . feeding supplement (PRO-STAT SUGAR FREE 64)  30 mL Per Tube BID  . [START ON 11/11/2016] feeding supplement (VITAL HIGH PROTEIN)  1,000 mL Per Tube Q24H  . guaiFENesin  600 mg Oral BID  . ipratropium  0.5 mg Nebulization Q6H  . levalbuterol  0.63 mg Nebulization Q6H  . mouth rinse  15 mL Mouth Rinse QID  . pantoprazole sodium  40 mg Per Tube Q1200    . piperacillin-tazobactam (ZOSYN)  IV  2.25 g Intravenous Q8H  . sodium chloride flush  10-40 mL Intracatheter Q12H   Continuous Infusions: . sodium chloride 10 mL/hr at 11/10/16 1200  . amiodarone 30 mg/hr (11/10/16 1216)  . heparin 1,300 Units/hr (11/10/16 1200)  . lactated ringers 10 mL/hr at 11/10/16 1200  . norepinephrine (LEVOPHED) Adult infusion 24.96 mcg/min (11/10/16 1200)  . propofol (DIPRIVAN) infusion 14.941 mcg/kg/min (11/10/16 1200)   PRN Meds:.Place/Maintain arterial line **AND** sodium chloride, acetaminophen, fentaNYL (SUBLIMAZE) injection, fentaNYL (SUBLIMAZE) injection, levalbuterol, potassium chloride (KCL MULTIRUN) 30 mEq in 265 mL IVPB, sodium chloride flush, traMADol  General appearance: alert, cooperative and no distress Neurologic: intact Heart: regular rate and rhythm, S1, S2 normal, no murmur, click, rub or gallop Lungs: diminished breath sounds bilaterally Abdomen: soft, non-tender; bowel sounds normal; no masses,  no organomegaly Extremities: extremities normal, atraumatic, no cyanosis or edema and Homans sign is negative, no sign of DVT Wound: air leak increased since on vent   Lab Results: CBC: Recent Labs  11/09/16 0429 11/10/16 0412  WBC 31.5* 26.1*  HGB 9.7* 9.9*  HCT 28.0* 28.1*  PLT 111* 79*   BMET:  Recent Labs  11/09/16 0429 11/10/16 0412  NA 125* 124*  K 3.7 3.3*  CL 97*  95*  CO2 19* 18*  GLUCOSE 171* 150*  BUN 37* 47*  CREATININE 2.44* 2.54*  CALCIUM 5.8* 5.7*    CMET: Lab Results  Component Value Date   WBC 26.1 (H) 11/10/2016   HGB 9.9 (L) 11/10/2016   HCT 28.1 (L) 11/10/2016   PLT 79 (L) 11/10/2016   GLUCOSE 150 (H) 11/10/2016   ALT 32 11/10/2016   AST 32 11/10/2016   NA 124 (L) 11/10/2016   K 3.3 (L) 11/10/2016   CL 95 (L) 11/10/2016   CREATININE 2.54 (H) 11/10/2016   BUN 47 (H) 11/10/2016   CO2 18 (L) 11/10/2016   INR 0.99 10/25/2016      PT/INR: No results for input(s): LABPROT, INR in the last 72  hours. Radiology: US Renal Port  Result Date: 11/10/2016 CLINICAL DATA:  71 year old female with acute renal failure x2 days. Recent right upper lobectomy. EXAM: RENAL / URINARY TRACT ULTRASOUND COMPLETE COMPARISON:  PET-CT dated 09/14/2016 FINDINGS: Evaluation is limited due to patient's body habitus and portable technique. Right Kidney: Length: 12 cm.  No hydronephrosis or echogenic stone. Left Kidney: Length: 11 cm.  No hydronephrosis or echogenic stone. Bladder: The urinary bladder is decompressed around a Foley catheter. IMPRESSION: No hydronephrosis or echogenic stone. Electronically Signed   By: Anner Crete M.D.   On: 11/10/2016 02:34   Dg Chest Port 1 View  Result Date: 11/10/2016 CLINICAL DATA:  Chest tube EXAM: PORTABLE CHEST 1 VIEW COMPARISON:  11/09/2016 FINDINGS: Support devices are stable. Right chest tubes remain in place. Lucency over the right apex again noted compatible with pneumothorax. The pleural edge is difficult to visualize but likely not significantly changed since prior study. Bilateral lower lobe airspace opacities and vascular congestion, similar prior study. IMPRESSION: No significant change since prior study. Stable right apical pneumothorax. Electronically Signed   By: Rolm Baptise M.D.   On: 11/10/2016 07:21   Dg Abd Portable 1v  Result Date: 11/10/2016 CLINICAL DATA:  Feeding tube placement EXAM: PORTABLE ABDOMEN - 1 VIEW COMPARISON:  None. FINDINGS: NG tube tip and feeding tube tip are in the perihilar region, likely distal stomach. Nonobstructive bowel gas pattern. No free air. IMPRESSION: NG tube and feeding tube tips in the perihilar region, likely distal stomach. Electronically Signed   By: Rolm Baptise M.D.   On: 11/10/2016 14:55     Assessment/Plan: S/P Procedure(s) (LRB): VIDEO BRONCHOSCOPY WITH INSERTION OF INTERBRONCHIAL VALVE (IBV) times three (N/A) Start nutrition, cor track placed  PULMONARY A: Acute Hypoxic Respiratory Failure secondary to  PNA Persistent Right Pneumothorax & Air Leak Secondary to lobectomy - Chest tubes in place & endobronchial valves placed 1/22 Moderate to Severe COPD CARDIOVASCULAR Septic Shock likely secondary to PNA Afib with RVR  Bilateral Calf Vein/Peroneal DVT Acute Renal Failure - likely pre-renal / secondary to shock  Nausea and Vomiting - resolving  Anticoagulation secondary to DVT Thrombocytopenia Septic Shock secondary to PNA   Leucocytosis  Acute Metabolic Encephalopathy   Cancer Staging Malignant neoplasm of upper lobe of right lung (HCC) Staging form: Lung, AJCC 8th Edition - Pathologic stage from 10/23/2016: Stage IIA (pT2b, pN0, cM0) - Signed by [name], MD on 10/30/2016  D/c ng  Tube feeding when post pyloric  Watch  plt count on heparin - may have to stop Discussed with family continue rx and support   [name] 11/10/2016 3:58 PM

## 2016-11-10 NOTE — Progress Notes (Signed)
ANTICOAGULATION CONSULT NOTE - Follow Up Consult  Pharmacy Consult for Heparin Indication: DVT  Allergies  Allergen Reactions  . Nickel Rash    Patient Measurements: Height: '5\' 5"'$  (165.1 cm) Weight: 158 lb 1.1 oz (71.7 kg) IBW/kg (Calculated) : 57 Heparin Dosing Weight:  68 kg  Vital Signs: Temp: 97.4 F (36.3 C) (01/27 0915) Temp Source: Oral (01/27 0915) BP: 112/69 (01/27 0900) Pulse Rate: 85 (01/27 0915)  Labs:  Recent Labs  11/07/16 1415  11/08/16 1607 11/08/16 2000 11/09/16 0429 11/09/16 0430 11/10/16 0412  HGB  --   < > 11.5*  --  9.7*  --  9.9*  HCT  --   < > 33.4*  --  28.0*  --  28.1*  PLT  --   < > 166  --  111*  --  79*  HEPARINUNFRC  --   < >  --  0.32  --  0.35 0.44  CREATININE  --   < > 1.99*  --  2.44*  --  2.54*  TROPONINI <0.03  --   --   --   --   --   --   < > = values in this interval not displayed.  Estimated Creatinine Clearance: 20.2 mL/min (by C-G formula based on SCr of 2.54 mg/dL (H)).  Assessment:  Anticoag: venous duplex w/ bilateral peroneal DVT on heparin + afib.  -HL= 0.44 in goal. Hgb 9.9. Plts 200's>111>79 (Heparin since 1/24). MD aware.  Goal of Therapy:  Heparin level 0.3-0.7 units/ml Monitor platelets by anticoagulation protocol: Yes   Plan:  - Heparin at 1300 units/h. Daily heparin level/CBC. Monitor for s/sx bleeding - Zosyn decrease 2.25g IV q8hr. - Vanco 1g IV x 1 on 1/26 (random level 1/28) - CaGluconate 1g IV x 1 - Needs K+ replacement, does not meet parameters for replacing with PRN orders because her Scr is >2. - Will follow renal function, cultures and clinical progress - Check ionized Ca, trend Na, trend PC  Maurion Walkowiak S. Alford Highland, PharmD, [facility] Clinical Staff Pharmacist Pager [phone]  Eilene Ghazi Stillinger 11/10/2016,10:16 AM

## 2016-11-10 NOTE — Progress Notes (Signed)
PULMONARY / CRITICAL CARE MEDICINE   Name: [name] MRN: [id number] DOB: 14-Nov-1945    ADMISSION DATE:  10/29/2016 CONSULTATION DATE:  11/07/2016  REFERRING MD:  Lanelle Bal, M.D. / CVTS  CHIEF COMPLAINT:  Acute Hypoxic Respiratory Failure  BRIEF SUMMARY:  71 y.o. 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 Initially was ambulatory in the ICU and out of bed without difficulty. She later had worsening hypoxia and respiratory status prompting initiation of BiPAP support and PCCM consultation.  Course complicated by shock thought to be sepsis from PNA but no positive cultures, AF with RVR, bilateral LE DVT.  Intubated for hypoxic respiratory failure 1/26.  SUBJECTIVE:  RN reports persistent air leak from CT, remains on amio 30 mg, levophed 23 mcg, vasopressin 0.04, fentanyl 150 mcg/hr, heparin gtt.   VITAL SIGNS: BP 112/69   Pulse (!) 51   Temp 98.3 F (36.8 C) (Oral)   Resp (!) 21   Ht '5\' 5"'$  (1.651 m)   Wt 158 lb 1.1 oz (71.7 kg)   SpO2 96%   BMI 26.30 kg/m   HEMODYNAMICS: CVP:  [7 mmHg-16 mmHg] 14 mmHg  VENTILATOR SETTINGS: Vent Mode: PRVC FiO2 (%):  [45 %-100 %] 50 % Set Rate:  [16 bmp] 16 bmp Vt Set:  [450 mL-460 mL] 460 mL PEEP:  [5 cmH20] 5 cmH20 Plateau Pressure:  [11 cmH20-13 cmH20] 11 cmH20  INTAKE / OUTPUT: I/O last 3 completed shifts: In: 0737 [I.V.:5856; NG/GT:60; IV Piggyback:460] Out: 2260 [Urine:1550; Emesis/NG output:350; Stool:50; Chest Tube:310]  PHYSICAL EXAMINATION: General: critically ill appearing F in NAD on vent HEENT: MM pink/moist, ETT, R IJ TLC  Neuro: sedate, attempts to open eyes to voice CV: s1s2 rrr, no m/r/g PULM: non-labored, lungs bilaterally R>L noise from air leak / CT TG:GYIR, non-tender, bsx4 active Extremities: warm/dry, no edema  Skin: no rashes or  lesions   LABS: CBC Latest Ref Rng & Units 11/10/2016 11/09/2016 11/08/2016  WBC 4.0 - 10.5 K/uL 26.1(H) 31.5(H) 40.3(H)  Hemoglobin 12.0 - 15.0 g/dL 9.9(L) 9.7(L) 11.5(L)  Hematocrit 36.0 - 46.0 % 28.1(L) 28.0(L) 33.4(L)  Platelets 150 - 400 K/uL 79(L) 111(L) 166   BMP Latest Ref Rng & Units 11/10/2016 11/09/2016 11/08/2016  Glucose 65 - 99 mg/dL 150(H) 171(H) 207(H)  BUN 6 - 20 mg/dL 47(H) 37(H) 32(H)  Creatinine 0.44 - 1.00 mg/dL 2.54(H) 2.44(H) 1.99(H)  Sodium 135 - 145 mmol/L 124(L) 125(L) 124(L)  Potassium 3.5 - 5.1 mmol/L 3.3(L) 3.7 4.3  Chloride 101 - 111 mmol/L 95(L) 97(L) 92(L)  CO2 22 - 32 mmol/L 18(L) 19(L) 19(L)  Calcium 8.9 - 10.3 mg/dL 5.7(LL) 5.8(LL) 6.6(L)    STUDIES:  PFT 08/24/16 >> FVC 2.54 L (89%) FEV1 1.22 L (57%) FEV1/FVC 0.48 FEF 25-75 0.44 L (24%) negative bronchial dilator response TLC 5.19 L (120%) RV 155% ERV 76% DLCO uncorrected 52% PORT CXR 1/24 >>  Previously reviewed by me. No new left opacity. No appreciated pleural effusion. Right basilar opacity relatively unchanged but need be slightly worsened. Right-sided chest tubes remain in place.  TTE 1/24 >>  Poor quality study. Unable to assess RV. No pericardial effusion. LV with mild LVH but normal in size. EF 60-65%. Unable to assess wall motion. Grade 1 diastolic dysfunction. BILAT LE VENOUS DUPLEX 1/24 >>  There is evidence of acute deep vein thrombosis involving the peroneal veins of  the right and left lower extremities. Renal US 1/27 >> no hydronephrosis or stones  MICROBIOLOGY: MRSA PCR 1/11:  Negative Right Bronchial Washing 1/22:  Negative Blood Ctx x2 1/24 >> Urine Ctx 1/24 >> negative  ANTIBIOTICS: Vancomycin 1/24 >> Zosyn 1/24 >>  SIGNIFICANT EVENTS: 1/15 - Admit w/ RUL Lobectomy 1/22 - Bronchoscopy with 3 right-sided endobronchial valves for persistent air leak 1/26 - Hypoxic respiratory failure, intubated   LINES/TUBES: Right Chest Tubes 1/15 >> R IJ CLV 1/24 >> Foley 1/24  >> PIV  ASSESSMENT / PLAN:  71 y.o. female admitted for right upper lobectomy completed on 1/15. Patient had subsequent persistent air leak. Patient's shock and acute hypoxic respiratory failure likely due to evolving sepsis. Source likely from her lung given recent surgery. No other source identified at this time. No indication for stress dose steroids. Continuing amiodarone and systemic anticoagulation.  PULMONARY A: Acute Hypoxic Respiratory Failure secondary to PNA Persistent Right Pneumothorax & Air Leak Secondary to lobectomy - Chest tubes in place & endobronchial valves placed 1/22 Moderate to Severe COPD P:   PRVC 8 cc/kg Wean PEEP / FiO2 for sats >92% Intermittent CXR CT per CVTS Continue xopenex + atrovent Q6  CARDIOVASCULAR A:  Septic Shock likely secondary to PNA Afib with RVR  Bilateral Calf Vein/Peroneal DVT H/O HTN, HLD  P:  Management per CVTS Continue amiodarone  Heparin per pharmacy for DVT Wean levophed to off for MAP >65 or SBP >90 D/C vasopressin  ICU monitoring   RENAL A:   Acute Renal Failure - likely pre-renal / secondary to shock  Hypomagnesemia Hyponatremia  P:  Trend BMP / UOP  Replace potassium, K 40 mEq per tube x1 Renal US as above, negative for hydro Avoid nephrotoxic medications   GASTROINTESTINAL A:   Nausea and Vomiting - resolving  P:   NPO Begin TF per nutrition  PPI for SUP  HEMATOLOGIC A:   Anticoagulation secondary to DVT  Thrombocytopenia  P: Continue heparin gtt per pharmacy  Trend CBC  Monitor for bleeding  INFECTIOUS A:   Septic Shock secondary to PNA   Leucocytosis  P:   Follow PCT / lactate  Trend fever curve / WBC ABX as above, D4/7 abx  Follow cultures to maturity  ENDOCRINE A:   Hyperglycemia  P:   CBG Q4 with SSI    NEUROLOGIC A:   Acute Metabolic Encephalopathy   P:   RASS Goal:  0 to -1  Propofol for sedation  PRN fentanyl for pain  FAMILY  - Updates:  No family available am 1/27.     - Inter-disciplinary family meet or Palliative Care meeting due by:  Ongoing    CC Time: 55 minutes   Noe Gens, NP-C [facility] Pgr: [phone] or if no answer [phone] 11/10/2016, 9:16 AM

## 2016-11-11 ENCOUNTER — Inpatient Hospital Stay (HOSPITAL_COMMUNITY): Payer: Medicare Other

## 2016-11-11 LAB — COMPREHENSIVE METABOLIC PANEL
ALT: 28 U/L (ref 14–54)
ALT: 25 U/L (ref 14–54)
AST: 28 U/L (ref 15–41)
AST: 32 U/L (ref 15–41)
Albumin: 1.5 g/dL — ABNORMAL LOW (ref 3.5–5.0)
Albumin: 1.4 g/dL — ABNORMAL LOW (ref 3.5–5.0)
Alkaline Phosphatase: 122 U/L (ref 38–126)
Alkaline Phosphatase: 131 U/L — ABNORMAL HIGH (ref 38–126)
Anion gap: 9 (ref 5–15)
Anion gap: 12 (ref 5–15)
BUN: 47 mg/dL — ABNORMAL HIGH (ref 6–20)
BUN: 47 mg/dL — ABNORMAL HIGH (ref 6–20)
CO2: 20 mmol/L — ABNORMAL LOW (ref 22–32)
CO2: 19 mmol/L — ABNORMAL LOW (ref 22–32)
Calcium: 6.5 mg/dL — ABNORMAL LOW (ref 8.9–10.3)
Calcium: 6.6 mg/dL — ABNORMAL LOW (ref 8.9–10.3)
Chloride: 100 mmol/L — ABNORMAL LOW (ref 101–111)
Chloride: 100 mmol/L — ABNORMAL LOW (ref 101–111)
Creatinine, Ser: 2.1 mg/dL — ABNORMAL HIGH (ref 0.44–1.00)
Creatinine, Ser: 2.19 mg/dL — ABNORMAL HIGH (ref 0.44–1.00)
GFR calc Af Amer: 26 mL/min — ABNORMAL LOW (ref >60)
GFR calc Af Amer: 25 mL/min — ABNORMAL LOW (ref >60)
GFR calc non Af Amer: 23 mL/min — ABNORMAL LOW (ref >60)
GFR calc non Af Amer: 21 mL/min — ABNORMAL LOW (ref >60)
Glucose, Bld: 135 mg/dL — ABNORMAL HIGH (ref 65–99)
Glucose, Bld: 145 mg/dL — ABNORMAL HIGH (ref 65–99)
Potassium: 2.8 mmol/L — ABNORMAL LOW (ref 3.5–5.1)
Potassium: 3.4 mmol/L — ABNORMAL LOW (ref 3.5–5.1)
Sodium: 129 mmol/L — ABNORMAL LOW (ref 135–145)
Sodium: 131 mmol/L — ABNORMAL LOW (ref 135–145)
Total Bilirubin: 0.4 mg/dL (ref 0.3–1.2)
Total Bilirubin: 0.8 mg/dL (ref 0.3–1.2)
Total Protein: 4.2 g/dL — ABNORMAL LOW (ref 6.5–8.1)
Total Protein: 3.8 g/dL — ABNORMAL LOW (ref 6.5–8.1)

## 2016-11-11 LAB — CBC
HCT: 29.6 % — ABNORMAL LOW (ref 36.0–46.0)
HCT: 29.4 % — ABNORMAL LOW (ref 36.0–46.0)
Hemoglobin: 10.6 g/dL — ABNORMAL LOW (ref 12.0–15.0)
Hemoglobin: 10.7 g/dL — ABNORMAL LOW (ref 12.0–15.0)
MCH: 30.2 pg (ref 26.0–34.0)
MCH: 30.7 pg (ref 26.0–34.0)
MCHC: 35.8 g/dL (ref 30.0–36.0)
MCHC: 36.4 g/dL — ABNORMAL HIGH (ref 30.0–36.0)
MCV: 84.3 fL (ref 78.0–100.0)
MCV: 84.2 fL (ref 78.0–100.0)
Platelets: 49 10*3/uL — ABNORMAL LOW (ref 150–400)
Platelets: 41 10*3/uL — ABNORMAL LOW (ref 150–400)
RBC: 3.51 MIL/uL — ABNORMAL LOW (ref 3.87–5.11)
RBC: 3.49 MIL/uL — ABNORMAL LOW (ref 3.87–5.11)
RDW: 14.1 % (ref 11.5–15.5)
RDW: 13.9 % (ref 11.5–15.5)
WBC: 28.2 10*3/uL — ABNORMAL HIGH (ref 4.0–10.5)
WBC: 25.2 10*3/uL — ABNORMAL HIGH (ref 4.0–10.5)

## 2016-11-11 LAB — HEPARIN INDUCED PLATELET AB (HIT ANTIBODY): Heparin Induced Plt Ab: 0.238 OD (ref 0.000–0.400)

## 2016-11-11 LAB — HEPARIN LEVEL (UNFRACTIONATED): Heparin Unfractionated: 0.32 IU/mL (ref 0.30–0.70)

## 2016-11-11 LAB — VANCOMYCIN, RANDOM: Vancomycin Rm: 11

## 2016-11-11 LAB — GLUCOSE, CAPILLARY
Glucose-Capillary: 132 mg/dL — ABNORMAL HIGH (ref 65–99)
Glucose-Capillary: 136 mg/dL — ABNORMAL HIGH (ref 65–99)
Glucose-Capillary: 140 mg/dL — ABNORMAL HIGH (ref 65–99)
Glucose-Capillary: 140 mg/dL — ABNORMAL HIGH (ref 65–99)
Glucose-Capillary: 142 mg/dL — ABNORMAL HIGH (ref 65–99)

## 2016-11-11 LAB — CALCIUM, IONIZED: CALCIUM, IONIZED, SERUM: 3.2 mg/dL — AB (ref 4.5–5.6)

## 2016-11-11 LAB — POCT I-STAT 3, ART BLOOD GAS (G3+)
Acid-base deficit: 5 mmol/L — ABNORMAL HIGH (ref 0.0–2.0)
Bicarbonate: 19.8 mmol/L — ABNORMAL LOW (ref 20.0–28.0)
O2 Saturation: 97 %
Patient temperature: 97.5
TCO2: 21 mmol/L (ref 0–100)
pCO2 arterial: 32.8 mmHg (ref 32.0–48.0)
pH, Arterial: 7.386 (ref 7.350–7.450)
pO2, Arterial: 84 mmHg (ref 83.0–108.0)

## 2016-11-11 LAB — PHOSPHORUS
PHOSPHORUS: 2.1 mg/dL — AB (ref 2.5–4.6)
Phosphorus: 2.7 mg/dL (ref 2.5–4.6)

## 2016-11-11 LAB — MAGNESIUM
MAGNESIUM: 2.5 mg/dL — AB (ref 1.7–2.4)
MAGNESIUM: 2.4 mg/dL (ref 1.7–2.4)

## 2016-11-11 LAB — CORTISOL: CORTISOL PLASMA: 24 ug/dL

## 2016-11-11 LAB — APTT
aPTT: 59 seconds — ABNORMAL HIGH (ref 24–36)
aPTT: 60 seconds — ABNORMAL HIGH (ref 24–36)
aPTT: 81 seconds — ABNORMAL HIGH (ref 24–36)

## 2016-11-11 LAB — PROTIME-INR
INR: 1.04
Prothrombin Time: 13.6 seconds (ref 11.4–15.2)

## 2016-11-11 MED ORDER — SODIUM CHLORIDE 0.9 % IV SOLN: Freq: Once | INTRAVENOUS | Status: DC

## 2016-11-11 MED ORDER — ARGATROBAN 50 MG/50ML IV SOLN
0.5000 ug/kg/min | INTRAVENOUS | Status: DC
  Administered 2016-11-11 – 2016-11-15 (×6): 0.5 ug/kg/min via INTRAVENOUS
  Filled 2016-11-11 (×8): qty 50

## 2016-11-11 MED ORDER — SODIUM CHLORIDE 0.9 % IV SOLN
30.0000 meq | Freq: Once | INTRAVENOUS | Status: AC
  Administered 2016-11-11: 30 meq via INTRAVENOUS
  Filled 2016-11-11: qty 15

## 2016-11-11 MED ORDER — PIPERACILLIN-TAZOBACTAM 3.375 G IVPB
3.3750 g | Freq: Three times a day (TID) | INTRAVENOUS | Status: AC
  Administered 2016-11-11 – 2016-11-14 (×11): 3.375 g via INTRAVENOUS
  Filled 2016-11-11 (×11): qty 50

## 2016-11-11 MED ORDER — VANCOMYCIN HCL IN DEXTROSE 1-5 GM/200ML-% IV SOLN
1000.0000 mg | INTRAVENOUS | Status: DC
  Administered 2016-11-11 – 2016-11-13 (×2): 1000 mg via INTRAVENOUS
  Filled 2016-11-11 (×2): qty 200

## 2016-11-11 NOTE — Progress Notes (Addendum)
Patient ID: [name], female   DOB: 1946-09-11, 71 y.o.   MRN: [id number] TCTS DAILY ICU PROGRESS NOTE                   [address]            [place] [address]          [phone]   Total Length of Stay:  LOS: 13 days  6 Days Post-Op Procedure(s) (LRB): VIDEO BRONCHOSCOPY WITH INSERTION OF INTERBRONCHIAL VALVE (IBV) times three (N/A) Day Post-Op Procedure(s) (LRB): VIDEO BRONCHOSCOPY WITH INSERTION OF INTERBRONCHIAL VALVE (IBV) times three (N/A) Day 13:Post-Op Procedure(s) (LRB): VIDEO BRONCHOSCOPY (N/A) RIGHT VIDEO ASSISTED THORACOSCOPY /RIGHT UPPER LOBE LUNG RESECTION and Right Thoracotomy (Right) LYMPH NODE DISSECTION (Right     Subjective: Remains sedated on ventilator. Tube feedings started last night  Objective: Vital signs in last 24 hours: Temp:  [96.6 F (35.9 C)-98 F (36.7 C)] 98 F (36.7 C) (01/27 2320) Pulse Rate:  [44-114] 93 (01/28 0714) Cardiac Rhythm: Normal sinus rhythm (01/28 0500) Resp:  [8-39] 39 (01/28 0714) BP: (85-121)/(42-70) 99/52 (01/28 0700) SpO2:  [92 %-100 %] 97 % (01/28 0714) Arterial Line BP: (101-145)/(41-63) 135/53 (01/28 0700) FiO2 (%):  [40 %-50 %] 40 % (01/28 0714) Weight:  [158 lb 11.7 oz (72 kg)] 158 lb 11.7 oz (72 kg) (01/28 0714)  Filed Weights   11/06/16 0400 11/10/16 0600 11/11/16 0714  Weight: 142 lb 10.2 oz (64.7 kg) 158 lb 1.1 oz (71.7 kg) 158 lb 11.7 oz (72 kg)    Weight change:    Hemodynamic parameters for last 24 hours:    Intake/Output from previous day: 01/27 0701 - 01/28 0700 In: 2548.9 [I.V.:1908.9; NG/GT:360; IV Piggyback:250] Out: 3085 [Urine:2595; Emesis/NG output:200; Stool:50; Chest Tube:240]  Intake/Output this shift: No intake/output data recorded.  Current Meds: Scheduled Meds: . chlorhexidine gluconate (MEDLINE KIT)  15 mL Mouth Rinse BID  . feeding supplement (PRO-STAT SUGAR FREE 64)  30 mL Per Tube BID  . feeding supplement (VITAL HIGH PROTEIN)  1,000 mL Per Tube  Q24H  . guaiFENesin  600 mg Oral BID  . ipratropium  0.5 mg Nebulization Q6H  . levalbuterol  0.63 mg Nebulization Q6H  . mouth rinse  15 mL Mouth Rinse Q2H  . pantoprazole sodium  40 mg Per Tube Q1200  . piperacillin-tazobactam (ZOSYN)  IV  3.375 g Intravenous Q8H  . sodium chloride flush  10-40 mL Intracatheter Q12H  . vancomycin  1,000 mg Intravenous Q48H   Continuous Infusions: . sodium chloride 10 mL/hr at 11/11/16 0700  . amiodarone 30 mg/hr (11/11/16 0700)  . heparin 1,300 Units/hr (11/11/16 0700)  . lactated ringers 10 mL/hr at 11/11/16 0700  . norepinephrine (LEVOPHED) Adult infusion 29.973 mcg/min (11/11/16 0700)  . propofol (DIPRIVAN) infusion 20.093 mcg/kg/min (11/11/16 0700)   PRN Meds:.Place/Maintain arterial line **AND** sodium chloride, acetaminophen, fentaNYL (SUBLIMAZE) injection, fentaNYL (SUBLIMAZE) injection, levalbuterol, potassium chloride (KCL MULTIRUN) 30 mEq in 265 mL IVPB, sodium chloride flush, traMADol  General appearance: Patient sedated on ventilator Neurologic: intact Heart: irregularly irregular rhythm Lungs: diminished breath sounds bibasilar Abdomen: soft, non-tender; bowel sounds normal; no masses,  no organomegaly Extremities: extremities normal, atraumatic, no cyanosis or edema and Homans sign is negative, no sign of DVT Wound: Airleak persistent  Lab Results: CBC: Recent Labs  11/10/16 0412 11/11/16 0427  WBC 26.1* 28.2*  HGB 9.9* 10.6*  HCT 28.1* 29.6*  PLT 79* 49*   BMET:  Recent Labs  11/10/16 0412 11/11/16 0427  NA 124* 129*  K 3.3* 2.8*  CL 95* 100*  CO2 18* 20*  GLUCOSE 150* 135*  BUN 47* 47*  CREATININE 2.54* 2.10*  CALCIUM 5.7* 6.5*    CMET: Lab Results  Component Value Date   WBC 28.2 (H) 11/11/2016   HGB 10.6 (L) 11/11/2016   HCT 29.6 (L) 11/11/2016   PLT 49 (L) 11/11/2016   GLUCOSE 135 (H) 11/11/2016   ALT 28 11/11/2016   AST 28 11/11/2016   NA 129 (L) 11/11/2016   K 2.8 (L) 11/11/2016   CL 100 (L)  11/11/2016   CREATININE 2.10 (H) 11/11/2016   BUN 47 (H) 11/11/2016   CO2 20 (L) 11/11/2016   INR 0.99 10/25/2016      PT/INR: No results for input(s): LABPROT, INR in the last 72 hours. Radiology: Dg Chest Port 1 View  Result Date: 11/11/2016 CLINICAL DATA:  Chest tube EXAM: PORTABLE CHEST 1 VIEW COMPARISON:  11/10/2016 FINDINGS: Two right chest tubes remain in place. Right apical pneumothorax again noted, likely not significantly changed. Patchy airspace disease throughout the right lung. Endotracheal tube, central line and NG tube are unchanged. IMPRESSION: Continued right pneumothorax with right chest tubes in place. Patchy right lung airspace disease. Electronically Signed   By: Rolm Baptise M.D.   On: 11/11/2016 07:48   Dg Abd Portable 1v  Result Date: 11/10/2016 CLINICAL DATA:  Feeding tube placement. EXAM: PORTABLE ABDOMEN - 1 VIEW COMPARISON:  11/10/2016 at 14:49 p.m. FINDINGS: Enteric feeding tube nurse present which courses through the stomach and over the expected course of the duodenum and has tip likely within the proximal jejunum in the left upper quadrant. Bowel gas pattern is nonobstructive. Remainder of the exam is unchanged. IMPRESSION: Nonobstructive bowel gas pattern. Enteric feeding tube with tip likely over the proximal jejunum in the left upper quadrant. Electronically Signed   By: Marin Olp M.D.   On: 11/10/2016 17:23   I have independently reviewed the above radiology studies  and reviewed the findings with the patient. Aeration of the remaining right lung appears improved on x-ray today still with apical space.     Assessment/Plan: S/P Procedure(s) (LRB): VIDEO BRONCHOSCOPY WITH INSERTION OF INTERBRONCHIAL VALVE (IBV) times three (N/A)  PULMONARY Acute Hypoxic Respiratory Failure secondary to PNA- chest x-ray today better aerated Persistent Right Pneumothorax &Air Leak Secondary to lobectomy - Chest tubes in place & endobronchial valves placed  1/22 Moderate toSevere COPD CARDIOVASCULAR Septic Shock likely secondary to PNA, hemodynamics improving  Afib with RVR  Bilateral Calf Vein/Peroneal DVT- started on heparin Acute Renal Failure - likely pre-renal / secondary to shock - creatinine and urine output improving Nausea and Vomiting - resolving  Anticoagulation secondary to DVT - Thrombocytopenia increasing Septic Shock secondary to PNA  Leucocytosis  Acute Metabolic Encephalopathy Replace potassium-hypokalemic this morning  Cancer Staging Malignant neoplasm of upper lobe of right lung (HCC) Staging form: Lung, AJCC 8th Edition - Pathologic stage from 10/23/2016: Stage IIA (pT2b, pN0, cM0) - Signed by [name], MD on 10/30/2016 Hit panel pending- anticoagulation changes per  critical care   [name] 11/11/2016 8:00 AM

## 2016-11-11 NOTE — Progress Notes (Addendum)
PULMONARY / CRITICAL CARE MEDICINE   Name: [name] MRN: [id number] DOB: [birth year]    ADMISSION DATE:  10/29/2016 CONSULTATION DATE:  11/07/2016  REFERRING MD:  Lanelle Bal, M.D. / CVTS  CHIEF COMPLAINT:  Acute Hypoxic Respiratory Failure  BRIEF SUMMARY:  71 y.o. 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 Initially was ambulatory in the ICU and out of bed without difficulty. She later had worsening hypoxia and respiratory status prompting initiation of BiPAP support and PCCM consultation.  Course complicated by shock thought to be sepsis from PNA but no positive cultures, AF with RVR, bilateral LE DVT.  Intubated for hypoxic respiratory failure 1/26.  SUBJECTIVE:  RN reports platelets decreased to 49 on heparin gtt.  Small amount of oral bleeding noted.  Remains in AF, rate controlled on amiodarone gtt.  Propofol at 20 mcg, levophed at 30 mcg.  Afebrile.  WBC increased to 28k  VITAL SIGNS: BP (!) 99/52   Pulse 93   Temp 98 F (36.7 C) (Oral)   Resp (!) 39   Ht '5\' 5"'$  (1.651 m)   Wt 158 lb 11.7 oz (72 kg)   SpO2 97%   BMI 26.41 kg/m   HEMODYNAMICS:    VENTILATOR SETTINGS: Vent Mode: PRVC FiO2 (%):  [40 %-50 %] 40 % Set Rate:  [16 bmp-28 bmp] 28 bmp Vt Set:  [340 mL-460 mL] 340 mL PEEP:  [5 cmH20] 5 cmH20 Plateau Pressure:  [8 cmH20-12 cmH20] 12 cmH20  INTAKE / OUTPUT: I/O last 3 completed shifts: In: 5397.6 [I.V.:4567.6; Other:30; NG/GT:390; IV Piggyback:410] Out: 8182 [Urine:3045; Emesis/NG output:400; Stool:100; Chest Tube:390]  PHYSICAL EXAMINATION: General: critically ill appearing female in NAD on vent  HEENT: MM pink/moist, ETT Neuro: sedate, pt arouses slightly and nods yes/no to questions CV: s1s2 rrr, no m/r/g PULM: non-labored on vent, chest tube with 7/7 airleak, Clear on L, right with noise  from chest tube XH:BZJI, non-tender, bsx4 active  Extremities: warm/dry, trace generalized edema  Skin: no rashes or lesions   LABS: CBC Latest Ref Rng & Units 11/11/2016 11/10/2016 11/09/2016  WBC 4.0 - 10.5 K/uL 28.2(H) 26.1(H) 31.5(H)  Hemoglobin 12.0 - 15.0 g/dL 10.6(L) 9.9(L) 9.7(L)  Hematocrit 36.0 - 46.0 % 29.6(L) 28.1(L) 28.0(L)  Platelets 150 - 400 K/uL 49(L) 79(L) 111(L)   BMP Latest Ref Rng & Units 11/11/2016 11/10/2016 11/09/2016  Glucose 65 - 99 mg/dL 135(H) 150(H) 171(H)  BUN 6 - 20 mg/dL 47(H) 47(H) 37(H)  Creatinine 0.44 - 1.00 mg/dL 2.10(H) 2.54(H) 2.44(H)  Sodium 135 - 145 mmol/L 129(L) 124(L) 125(L)  Potassium 3.5 - 5.1 mmol/L 2.8(L) 3.3(L) 3.7  Chloride 101 - 111 mmol/L 100(L) 95(L) 97(L)  CO2 22 - 32 mmol/L 20(L) 18(L) 19(L)  Calcium 8.9 - 10.3 mg/dL 6.5(L) 5.7(LL) 5.8(LL)    STUDIES:  PFT 08/24/16 >> FVC 2.54 L (89%) FEV1 1.22 L (57%) FEV1/FVC 0.48 FEF 25-75 0.44 L (24%) negative bronchial dilator response TLC 5.19 L (120%) RV 155% ERV 76% DLCO uncorrected 52% PORT CXR 1/24 >>  Previously reviewed by me. No new left opacity. No appreciated pleural effusion. Right basilar opacity relatively unchanged but need be slightly worsened. Right-sided chest tubes remain in place.  TTE 1/24 >>  Poor quality study. Unable to assess RV. No pericardial effusion. LV with mild LVH but normal in size. EF 60-65%. Unable to assess wall motion. Grade  1 diastolic dysfunction. BILAT LE VENOUS DUPLEX 1/24 >>  There is evidence of acute deep vein thrombosis involving the peroneal veins of the right and left lower extremities. Renal US 1/27 >> no hydronephrosis or stones  MICROBIOLOGY: MRSA PCR 1/11:  Negative Right Bronchial Washing 1/22:  Negative Blood Ctx x2 1/24 >>  Urine Ctx 1/24 >> negative  ANTIBIOTICS: Vancomycin 1/24 >> Zosyn 1/24 >>  SIGNIFICANT EVENTS: 1/15 - Admit w/ RUL Lobectomy 1/22 - Bronchoscopy with 3 right-sided endobronchial valves for persistent air leak 1/26  - Hypoxic respiratory failure, intubated  1/28 - Heparin d/c'd due to thrombocytopenia, HIT panel pending.  Changed to argatroban.  LINES/TUBES: Right Chest Tubes 1/15 >> R IJ CLV 1/24 >> Foley 1/24 >> PIV  ASSESSMENT / PLAN:  71 y.o. female admitted for right upper lobectomy completed on 1/15. Patient had subsequent persistent air leak. Patient's shock and acute hypoxic respiratory failure likely due to evolving sepsis from PNA (NOS). Source likely from her lung given recent surgery. No other source identified at this time. No indication for stress dose steroids. Continue amiodarone and systemic anticoagulation > change to argatroban 1/28 due to thrombocytopenia.    PULMONARY A: Acute Hypoxic Respiratory Failure secondary to PNA Persistent Right Pneumothorax & Air Leak Secondary to lobectomy - Chest tubes in place & endobronchial valves placed 1/22 Moderate to Severe COPD P:   PRVC 8 cc/kg Wean PEEP / FiO2 for sats > 92% Intermittent CXR  Chest tube per CVTS Continue xopenex + atrovent Q6 Mobilization as able > frequent turning   CARDIOVASCULAR A:  Septic Shock likely secondary to PNA Afib with RVR  Bilateral Calf Vein/Peroneal DVT H/O HTN, HLD  P:  Management per CVTS Continue amiodarone  D/C heparin gtt with thrombocytopenia > discussed with Dr. Servando Snare  Begin argatroban for DVT  Wean levophed to off for MAP > 65 Monitor rhythm closely  Assess cortisol, CVP   RENAL A:   Acute Renal Failure - likely pre-renal / secondary to shock  Hypomagnesemia Hyponatremia  P:  Trend BMP / UOP  Replace K  Avoid nephrotoxic agents Calcium corrects to 8.5 for albumin   GASTROINTESTINAL A:   Nausea and Vomiting - resolved P:   NPO  TF per nutrition Prostat PPI for SUP   HEMATOLOGIC A:   Anticoagulation secondary to DVT  Thrombocytopenia  R/O HIT  P: D/C heparin gtt  Argatroban per pharmacy  Trend CBC  Monitor for bleeding   INFECTIOUS A:   Septic Shock  secondary to PNA   Leucocytosis  P:   Trend PCT / lactate  Trend fever curve / WBC ABX as above, D5/7  Follow cultures to maturity   ENDOCRINE A:   Hyperglycemia  P:   Q4 CBG with SSI   NEUROLOGIC A:   Acute Metabolic Encephalopathy  Post Operative Pain   P:   RASS Goal: 0 to -1  Consider changing propofol to fentanyl with hypotension   FAMILY  - Updates:  No family at bedside.  Case discussed with Dr. Servando Snare.  Family updated 1/27 pm per Dr. Servando Snare.    - Inter-disciplinary family meet or Palliative Care meeting due by:  Ongoing    CC Time: 25 minutes   Noe Gens, NP-C [facility] Pgr: [phone] or if no answer [phone] 11/11/2016, 8:33 AM   Attending note: I have seen and examined the patient with nurse practitioner/resident and agree with the note. History, labs and imaging reviewed.  71 year old with right  upper lobe non-small cell lung cancer status post lobectomy. Persistent air leak Post op complicated by septic shock, pneumonia. Intubated  for progressive respiratory failure Levaphed requirements are slightly improved.   Blood pressure (!) 96/55, pulse 92, temperature 98.1 F (36.7 C), temperature source Oral, resp. rate (!) 22, height '5\' 5"'$  (1.651 m), weight 158 lb 11.7 oz (72 kg), SpO2 99 %. Gen:       No acute distress HEENT:  EOMI, sclera anicteric Neck:     No masses; no thyromegaly Lungs:    Scattered rhonchi; normal respiratory effort CV:         Regular rate and rhythm; no murmurs Abd:        + bowel sounds; soft, non-tender; no palpable masses, no distension Ext:         No edema; adequate peripheral perfusion Skin:      Warm and dry; no rash Neuro: Sedated, opens eyes to command Psych: normal mood and affect  Assessment/plan: Acute hypoxic respiratory failure secondary to pneumonia Recent right upper lobectomy with persistent air leak History of severe COPD DVT on argatroban Thrombocytopenia. R/O HIT  -  Weaned off pressors as tolerated - Continue broad-spectrum antibiotics. Follow cultures - Changed from heparin to argatroban for low platelets. HIT antibody pending - Continue vent support - AKI stable  The patient is critically ill with multiple organ systems failure and requires high complexity decision making for assessment and support, frequent evaluation and titration of therapies, application of advanced monitoring technologies and extensive interpretation of multiple databases.  Critical care time - 35 mins. This represents my time independent of the NPs time taking care of the pt.  Marshell Garfinkel MD [facility] Pager [phone] If no answer or after 3pm call: [phone] 11/11/2016, 3:18 PM

## 2016-11-11 NOTE — Progress Notes (Addendum)
ANTICOAGULATION CONSULT NOTE - Follow up [facility] for argatroban Indication: DVT/Suspected HIT  Allergies  Allergen Reactions  . Nickel Rash    Patient Measurements: Height: '5\' 5"'$  (165.1 cm) Weight: 158 lb 11.7 oz (72 kg) IBW/kg (Calculated) : 57 Heparin Dosing Weight: 68kg  Vital Signs: Temp: 98.6 F (37 C) (01/28 1700) Temp Source: Oral (01/28 1700) BP: 96/55 (01/28 1300) Pulse Rate: 92 (01/28 1512)  Labs:  Recent Labs  11/09/16 0430 11/10/16 0412 11/11/16 0427 11/11/16 0500 11/11/16 0913 11/11/16 1339 11/11/16 1647  HGB  --  9.9* 10.6*  --  10.7*  --   --   HCT  --  28.1* 29.6*  --  29.4*  --   --   PLT  --  79* 49*  --  41*  --   --   APTT  --   --   --   --  59* 60* 81*  LABPROT  --   --   --   --  13.6  --   --   INR  --   --   --   --  1.04  --   --   HEPARINUNFRC 0.35 0.44  --  0.32  --   --   --   CREATININE  --  2.54* 2.10*  --  2.19*  --   --     Estimated Creatinine Clearance: 23.4 mL/min (by C-G formula based on SCr of 2.19 mg/dL (H)).   Medical History: Past Medical History:  Diagnosis Date  . Arthritis   . Asthma    asthmatic bronchitis  . COPD (chronic obstructive pulmonary disease) ([place])   . Dyspnea   . History of kidney stones   . Hyperlipidemia   . Hypertension   . Pneumonia   . PONV (postoperative nausea and vomiting)    none with her hip surgery  . Restless leg syndrome   . 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 Platelet count has decreased over past three days, per MD discontinue heparin and transition to argatroban for suspected HIT. HIT antibody pending, plt today 49. Baseline labs (1/28 at 9:13 AM): aPTT 59, INR 1.04, PLTC 41, Hgb 10.7  APTT therapeutic x1 at 60.   Goal of Therapy:  APTT 50-90 seconds Monitor platelets by anticoagulation protocol: Yes   Plan:  -D/c all  heparin products -Continue argatroban 0.5 mcg/kg/min  -Monitor aPTT every 2 hours until therapeutic x2 -Daily aPTT and CBC -Follow-up results from HIT antibody  Carlean Jews, Pharm.D. PGY1 Pharmacy Resident 1/28/20185:22 PM Pager [phone]  --------------------------------  ADDENDUM 11/11/2016 5:22 PM  Second APTT therapeutic at 81. Continue argatroban at 0.5 mcg/kg/min. No bleeding noted. Will check APTT with AM labs.   Carlean Jews, Pharm.D. PGY1 Pharmacy Resident 1/28/20185:22 PM Pager [phone]

## 2016-11-11 NOTE — Progress Notes (Signed)
ANTICOAGULATION CONSULT NOTE - Initial Consult  Pharmacy Consult for argatroban Indication: DVT/Suspected HIT  Allergies  Allergen Reactions  . Nickel Rash    Patient Measurements: Height: '5\' 5"'$  (165.1 cm) Weight: 158 lb 11.7 oz (72 kg) IBW/kg (Calculated) : 57 Heparin Dosing Weight: 68kg  Vital Signs: Temp: 98 F (36.7 C) (01/27 2320) Temp Source: Oral (01/27 2320) BP: 99/52 (01/28 0700) Pulse Rate: 93 (01/28 0714)  Labs:  Recent Labs  11/09/16 0429 11/09/16 0430 11/10/16 0412 11/11/16 0427 11/11/16 0500  HGB 9.7*  --  9.9* 10.6*  --   HCT 28.0*  --  28.1* 29.6*  --   PLT 111*  --  79* 49*  --   HEPARINUNFRC  --  0.35 0.44  --  0.32  CREATININE 2.44*  --  2.54* 2.10*  --     Estimated Creatinine Clearance: 24.4 mL/min (by C-G formula based on SCr of 2.1 mg/dL (H)).   Medical History: Past Medical History:  Diagnosis Date  . Arthritis   . Asthma    asthmatic bronchitis  . COPD (chronic obstructive pulmonary disease) ([place])   . Dyspnea   . History of kidney stones   . Hyperlipidemia   . Hypertension   . Pneumonia   . PONV (postoperative nausea and vomiting)    none with her hip surgery  . Restless leg syndrome   . 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 Platelet count has decreased over past three days, per MD discontinue heparin and transition to argatroban for suspected HIT. HIT antibody pending, plt today 49. Baseline labs in process.   Goal of Therapy:  APTT 50-90 seconds Monitor platelets by anticoagulation protocol: Yes   Plan:  -Discontinue heparin -Obtain baseline CMET, CBC, PT/INR, aPTT -Initiate argatroban 0.5 mcg/kg/min with no bolus -Monitor aPTT every 2 hours until therapeutic x2 -Daily aPTT -Follow-up results from HIT antibody  Arrie Senate, PharmD PGY-1 Pharmacy Resident Pager:  [phone] 11/11/2016

## 2016-11-11 NOTE — Progress Notes (Addendum)
ANTICOAGULATION CONSULT NOTE - Follow Up Consult  Pharmacy Consult for Heparin, vancomycin + Zosyn Indication: DVT, PNA  Allergies  Allergen Reactions  . Nickel Rash    Patient Measurements: Height: '5\' 5"'$  (165.1 cm) Weight: 158 lb 1.1 oz (71.7 kg) IBW/kg (Calculated) : 57 Heparin Dosing Weight:  68 kg  Vital Signs: Temp: 98 F (36.7 C) (01/27 2320) Temp Source: Oral (01/27 2320) BP: 88/54 (01/28 0400) Pulse Rate: 86 (01/28 0400)  Labs:  Recent Labs  11/08/16 1607  11/09/16 0429 11/09/16 0430 11/10/16 0412 11/11/16 0427 11/11/16 0500  HGB 11.5*  --  9.7*  --  9.9* 10.6*  --   HCT 33.4*  --  28.0*  --  28.1* 29.6*  --   PLT 166  --  111*  --  79* 49*  --   HEPARINUNFRC  --   < >  --  0.35 0.44  --  0.32  CREATININE 1.99*  --  2.44*  --  2.54*  --   --   < > = values in this interval not displayed.  Estimated Creatinine Clearance: 20.2 mL/min (by C-G formula based on SCr of 2.54 mg/dL (H)).  Assessment: 53 YOF with venous duplex positive for bilateral peroneal DVT + AFib on heparin.  Heparin level remains in goal range at 0.32 units/mL. Hgb 10.6, platelets dropped further to 49 (previously in the 200s, on heparin since 1/24)- MD aware and notes heparin may need to be stopped. No overt bleeding noted.   A random vancomycin level drawn this morning and is below goal range at 19mg/mL. Last received vancomycin 1g IV x1 on 1/26. SCr pending this morning, has been increasing. WBC still elevated at 28.2, afebrile.  Goal of Therapy:  Heparin level 0.3-0.7 units/ml Monitor platelets by anticoagulation protocol: Yes   Plan:  - Continue Heparin at 1300 units/h. Daily heparin level/CBC. Monitor for s/sx bleeding - Change Zosyn to 3.375g IV q8h EI - Vancomycin 1g IV q48h - Follow renal function, cultures and clinical progress   Sahiba Granholm D. Zanyiah Posten, PharmD, BCPS Clinical Pharmacist Pager: [phone]/28/2018 5:09 AM

## 2016-11-11 NOTE — Progress Notes (Signed)
Patient ID: [name], female   DOB: Feb 18, 1946, 71 y.o.   MRN: [id number] EVENING ROUNDS NOTE :     [address]       [place] [address]             [phone]                 6 Days Post-Op Procedure(s) (LRB): VIDEO BRONCHOSCOPY WITH INSERTION OF INTERBRONCHIAL VALVE (IBV) times three (N/A)  Total Length of Stay:  LOS: 13 days  BP (!) 96/55   Pulse 92   Temp 98.1 F (36.7 C) (Oral)   Resp (!) 22   Ht '5\' 5"'$  (1.651 m)   Wt 158 lb 11.7 oz (72 kg)   SpO2 99%   BMI 26.41 kg/m   .Intake/Output      01/27 0701 - 01/28 0700 01/28 0701 - 01/29 0700   I.V. (mL/kg) 1925.6 (26.9) 388.8 (5.4)   Other 30    NG/GT 410 510   IV Piggyback 250 315   Total Intake(mL/kg) 2615.6 (36.5) 1213.8 (16.9)   Urine (mL/kg/hr) 2595 (1.5) 685 (1)   Emesis/NG output 200 (0.1)    Stool 50 (0)    Chest Tube 240 (0.1)    Total Output 3085 685   Net -469.4 +528.8          . sodium chloride 10 mL/hr at 11/11/16 0700  . amiodarone 30 mg/hr (11/11/16 1307)  . argatroban 0.5 mcg/kg/min (11/11/16 1202)  . lactated ringers 10 mL/hr at 11/11/16 0700  . norepinephrine (LEVOPHED) Adult infusion 29.973 mcg/min (11/11/16 1537)  . propofol (DIPRIVAN) infusion 22.154 mcg/kg/min (11/11/16 1307)     Lab Results  Component Value Date   WBC 25.2 (H) 11/11/2016   HGB 10.7 (L) 11/11/2016   HCT 29.4 (L) 11/11/2016   PLT 41 (L) 11/11/2016   GLUCOSE 145 (H) 11/11/2016   ALT 25 11/11/2016   AST 32 11/11/2016   NA 131 (L) 11/11/2016   K 3.4 (L) 11/11/2016   CL 100 (L) 11/11/2016   CREATININE 2.19 (H) 11/11/2016   BUN 47 (H) 11/11/2016   CO2 19 (L) 11/11/2016   INR 1.04 11/11/2016   uop improving but still net + in   [name] MD  [place] Office [phone] 11/11/2016 4:33 PM

## 2016-11-12 ENCOUNTER — Inpatient Hospital Stay (HOSPITAL_COMMUNITY): Payer: Medicare Other

## 2016-11-12 DIAGNOSIS — C3400 Malignant neoplasm of unspecified main bronchus: Secondary | ICD-10-CM

## 2016-11-12 DIAGNOSIS — N179 Acute kidney failure, unspecified: Secondary | ICD-10-CM

## 2016-11-12 DIAGNOSIS — R0602 Shortness of breath: Secondary | ICD-10-CM

## 2016-11-12 DIAGNOSIS — C3411 Malignant neoplasm of upper lobe, right bronchus or lung: Principal | ICD-10-CM

## 2016-11-12 DIAGNOSIS — Z09 Encounter for follow-up examination after completed treatment for conditions other than malignant neoplasm: Secondary | ICD-10-CM

## 2016-11-12 LAB — BASIC METABOLIC PANEL
Anion gap: 8 (ref 5–15)
BUN: 48 mg/dL — ABNORMAL HIGH (ref 6–20)
CO2: 23 mmol/L (ref 22–32)
Calcium: 7.1 mg/dL — ABNORMAL LOW (ref 8.9–10.3)
Chloride: 106 mmol/L (ref 101–111)
Creatinine, Ser: 1.97 mg/dL — ABNORMAL HIGH (ref 0.44–1.00)
GFR calc Af Amer: 28 mL/min — ABNORMAL LOW (ref >60)
GFR calc non Af Amer: 24 mL/min — ABNORMAL LOW (ref >60)
Glucose, Bld: 133 mg/dL — ABNORMAL HIGH (ref 65–99)
Potassium: 2.9 mmol/L — ABNORMAL LOW (ref 3.5–5.1)
Sodium: 137 mmol/L (ref 135–145)

## 2016-11-12 LAB — CBC
HCT: 28.2 % — ABNORMAL LOW (ref 36.0–46.0)
Hemoglobin: 10 g/dL — ABNORMAL LOW (ref 12.0–15.0)
MCH: 30.2 pg (ref 26.0–34.0)
MCHC: 35.5 g/dL (ref 30.0–36.0)
MCV: 85.2 fL (ref 78.0–100.0)
Platelets: 31 10*3/uL — ABNORMAL LOW (ref 150–400)
RBC: 3.31 MIL/uL — ABNORMAL LOW (ref 3.87–5.11)
RDW: 14.3 % (ref 11.5–15.5)
WBC: 17.5 10*3/uL — ABNORMAL HIGH (ref 4.0–10.5)

## 2016-11-12 LAB — GLUCOSE, CAPILLARY
Glucose-Capillary: 125 mg/dL — ABNORMAL HIGH (ref 65–99)
Glucose-Capillary: 144 mg/dL — ABNORMAL HIGH (ref 65–99)
Glucose-Capillary: 111 mg/dL — ABNORMAL HIGH (ref 65–99)
Glucose-Capillary: 118 mg/dL — ABNORMAL HIGH (ref 65–99)
Glucose-Capillary: 121 mg/dL — ABNORMAL HIGH (ref 65–99)
Glucose-Capillary: 111 mg/dL — ABNORMAL HIGH (ref 65–99)

## 2016-11-12 LAB — MAGNESIUM: MAGNESIUM: 2.3 mg/dL (ref 1.7–2.4)

## 2016-11-12 LAB — POCT I-STAT 3, ART BLOOD GAS (G3+)
Acid-Base Excess: 1 mmol/L (ref 0.0–2.0)
Bicarbonate: 26.4 mmol/L (ref 20.0–28.0)
O2 Saturation: 99 %
Patient temperature: 98.6
TCO2: 28 mmol/L (ref 0–100)
pCO2 arterial: 45.1 mmHg (ref 32.0–48.0)
pH, Arterial: 7.376 (ref 7.350–7.450)
pO2, Arterial: 119 mmHg — ABNORMAL HIGH (ref 83.0–108.0)

## 2016-11-12 LAB — APTT: aPTT: 78 seconds — ABNORMAL HIGH (ref 24–36)

## 2016-11-12 LAB — PHOSPHORUS: PHOSPHORUS: 2.5 mg/dL (ref 2.5–4.6)

## 2016-11-12 LAB — CULTURE, BLOOD (ROUTINE X 2)
Culture: NO GROWTH
Culture: NO GROWTH

## 2016-11-12 LAB — TRIGLYCERIDES: Triglycerides: 137 mg/dL (ref <150)

## 2016-11-12 LAB — CORTISOL: CORTISOL PLASMA: 22.4 ug/dL

## 2016-11-12 MED ORDER — SODIUM CHLORIDE 0.9 % IV SOLN
30.0000 meq | Freq: Once | INTRAVENOUS | Status: AC
  Administered 2016-11-12: 30 meq via INTRAVENOUS
  Filled 2016-11-12: qty 15

## 2016-11-12 MED ORDER — AMIODARONE HCL 200 MG PO TABS
400.0000 mg | ORAL_TABLET | Freq: Two times a day (BID) | ORAL | Status: DC
  Administered 2016-11-12: 400 mg via ORAL
  Filled 2016-11-12 (×2): qty 2

## 2016-11-12 MED ORDER — INSULIN ASPART 100 UNIT/ML ~~LOC~~ SOLN
0.0000 [IU] | SUBCUTANEOUS | Status: DC
  Administered 2016-11-12 – 2016-11-18 (×13): 2 [IU] via SUBCUTANEOUS
  Administered 2016-11-20: 4 [IU] via SUBCUTANEOUS
  Administered 2016-11-20 – 2016-11-23 (×12): 2 [IU] via SUBCUTANEOUS
  Administered 2016-11-23: 3 [IU] via SUBCUTANEOUS
  Administered 2016-11-23 – 2016-11-24 (×7): 2 [IU] via SUBCUTANEOUS
  Administered 2016-11-24: 3 [IU] via SUBCUTANEOUS
  Administered 2016-11-25 – 2016-11-27 (×11): 2 [IU] via SUBCUTANEOUS
  Administered 2016-11-27: 3 [IU] via SUBCUTANEOUS
  Administered 2016-11-28 (×2): 2 [IU] via SUBCUTANEOUS
  Administered 2016-11-28: 3 [IU] via SUBCUTANEOUS
  Administered 2016-11-28 – 2016-11-29 (×2): 2 [IU] via SUBCUTANEOUS
  Administered 2016-11-29: 3 [IU] via SUBCUTANEOUS
  Administered 2016-11-29 – 2016-11-30 (×4): 2 [IU] via SUBCUTANEOUS
  Administered 2016-11-30: 3 [IU] via SUBCUTANEOUS
  Administered 2016-11-30: 2 [IU] via SUBCUTANEOUS
  Administered 2016-12-01: 3 [IU] via SUBCUTANEOUS
  Administered 2016-12-01 – 2016-12-03 (×8): 2 [IU] via SUBCUTANEOUS
  Administered 2016-12-04: 3 [IU] via SUBCUTANEOUS
  Administered 2016-12-04 – 2016-12-05 (×2): 2 [IU] via SUBCUTANEOUS

## 2016-11-12 MED ORDER — GERHARDT'S BUTT CREAM
TOPICAL_CREAM | Freq: Two times a day (BID) | CUTANEOUS | Status: DC
  Administered 2016-11-12 – 2016-11-15 (×7): via TOPICAL
  Administered 2016-11-15: 1 via TOPICAL
  Administered 2016-11-16 – 2016-11-20 (×10): via TOPICAL
  Administered 2016-11-21: 1 via TOPICAL
  Administered 2016-11-21 – 2016-11-28 (×16): via TOPICAL
  Administered 2016-11-29 (×2): 1 via TOPICAL
  Administered 2016-11-30 – 2016-12-03 (×7): via TOPICAL
  Administered 2016-12-03: 1 via TOPICAL
  Administered 2016-12-04 – 2016-12-05 (×2): via TOPICAL
  Filled 2016-11-12 (×6): qty 1

## 2016-11-12 MED ORDER — SODIUM CHLORIDE 0.9 % IV BOLUS (SEPSIS)
500.0000 mL | Freq: Once | INTRAVENOUS | Status: AC
  Administered 2016-11-12: 500 mL via INTRAVENOUS

## 2016-11-12 MED ORDER — SODIUM CHLORIDE 0.9 % IV SOLN
25.0000 ug/h | INTRAVENOUS | Status: DC
  Administered 2016-11-12: 25 ug/h via INTRAVENOUS
  Filled 2016-11-12: qty 50

## 2016-11-12 NOTE — Progress Notes (Signed)
eLink Physician-Brief Progress Note Patient Name: [name] DOB: Apr 23, 1946 MRN: [id number]   Date of Service  11/12/2016  HPI/Events of Note  Pt is in sinus rhythm   eICU Interventions  Change IV amio drip to PO bid Ok to transition per CVTS     Intervention Category Intermediate Interventions: Arrhythmia - evaluation and management  Okechukwu Regnier 11/12/2016, 6:02 PM

## 2016-11-12 NOTE — Progress Notes (Signed)
CT surgery p.m. Rounds  Patient remains sedated on ventilator Right air leak improved Hemodynamics stable, norepinephrine weaned to 6 g

## 2016-11-12 NOTE — Progress Notes (Signed)
Patient ID: [name], female   DOB: 11/28/1945, 71 y.o.   MRN: [id number] TCTS DAILY ICU PROGRESS NOTE                   [address]            [place] [address]          [phone]   7 Days Post-Op Procedure(s) (LRB): VIDEO BRONCHOSCOPY WITH INSERTION OF INTERBRONCHIAL VALVE (IBV) times three (N/A)  Total Length of Stay:  LOS: 14 days   Subjective: Sedated on vent   Objective: Vital signs in last 24 hours: Temp:  [96.9 F (36.1 C)-98.6 F (37 C)] 98.5 F (36.9 C) (01/29 0400) Pulse Rate:  [81-119] 92 (01/29 0500) Cardiac Rhythm: Normal sinus rhythm (01/28 2000) Resp:  [19-31] 29 (01/29 0500) BP: (90-113)/(43-77) 99/43 (01/29 0500) SpO2:  [90 %-100 %] 95 % (01/29 0500) Arterial Line BP: (122-137)/(48-57) 122/48 (01/28 1200) FiO2 (%):  [40 %] 40 % (01/29 0300) Weight:  [158 lb 11.7 oz (72 kg)] 158 lb 11.7 oz (72 kg) (01/29 0500)  Filed Weights   11/10/16 0600 11/11/16 0714 11/12/16 0500  Weight: 158 lb 1.1 oz (71.7 kg) 158 lb 11.7 oz (72 kg) 158 lb 11.7 oz (72 kg)    Weight change:    Hemodynamic parameters for last 24 hours: CVP:  [1 mmHg-6 mmHg] 1 mmHg  Intake/Output from previous day: 01/28 0701 - 01/29 0700 In: 3183.9 [I.V.:1508.9; NG/GT:1360; IV Piggyback:315] Out: 2580 [Urine:1935; Emesis/NG output:300; Chest Tube:345]  Intake/Output this shift: No intake/output data recorded.  Current Meds: Scheduled Meds: . chlorhexidine gluconate (MEDLINE KIT)  15 mL Mouth Rinse BID  . feeding supplement (PRO-STAT SUGAR FREE 64)  30 mL Per Tube BID  . feeding supplement (VITAL HIGH PROTEIN)  1,000 mL Per Tube Q24H  . guaiFENesin  600 mg Oral BID  . ipratropium  0.5 mg Nebulization Q6H  . levalbuterol  0.63 mg Nebulization Q6H  . mouth rinse  15 mL Mouth Rinse Q2H  . pantoprazole sodium  40 mg Per Tube Q1200  . piperacillin-tazobactam (ZOSYN)  IV  3.375 g Intravenous Q8H  . potassium chloride (KCL MULTIRUN) 30 mEq in 265 mL IVPB  30 mEq  Intravenous Once  . sodium chloride flush  10-40 mL Intracatheter Q12H  . vancomycin  1,000 mg Intravenous Q48H   Continuous Infusions: . sodium chloride 10 mL/hr at 11/12/16 0600  . amiodarone 30 mg/hr (11/12/16 0600)  . argatroban 0.5 mcg/kg/min (11/12/16 0600)  . lactated ringers 10 mL/hr at 11/12/16 0600  . norepinephrine (LEVOPHED) Adult infusion 26.027 mcg/min (11/12/16 0600)  . propofol (DIPRIVAN) infusion 20.093 mcg/kg/min (11/12/16 0600)   PRN Meds:.Place/Maintain arterial line **AND** sodium chloride, acetaminophen, fentaNYL (SUBLIMAZE) injection, fentaNYL (SUBLIMAZE) injection, levalbuterol, potassium chloride (KCL MULTIRUN) 30 mEq in 265 mL IVPB, sodium chloride flush, traMADol  General appearance: sedated on vent  Neurologic: intact Heart: regular rate and rhythm, S1, S2 normal, no murmur, click, rub or gallop Lungs: diminished breath sounds bibasilar Abdomen: soft, non-tender; bowel sounds normal; no masses,  no organomegaly Extremities: extremities normal, atraumatic, no cyanosis or edema and Homans sign is negative, no sign of DVT Wound: air leak   Lab Results: CBC: Recent Labs  11/11/16 0913 11/12/16 0448  WBC 25.2* 17.5*  HGB 10.7* 10.0*  HCT 29.4* 28.2*  PLT 41* PENDING   BMET:  Recent Labs  11/11/16 0913 11/12/16 0448  NA 131* 137  K 3.4* 2.9*  CL  100* 106  CO2 19* 23  GLUCOSE 145* 133*  BUN 47* 48*  CREATININE 2.19* 1.97*  CALCIUM 6.6* 7.1*    CMET: Lab Results  Component Value Date   WBC 17.5 (H) 11/12/2016   HGB 10.0 (L) 11/12/2016   HCT 28.2 (L) 11/12/2016   PLT PENDING 11/12/2016   GLUCOSE 133 (H) 11/12/2016   TRIG 137 11/12/2016   ALT 25 11/11/2016   AST 32 11/11/2016   NA 137 11/12/2016   K 2.9 (L) 11/12/2016   CL 106 11/12/2016   CREATININE 1.97 (H) 11/12/2016   BUN 48 (H) 11/12/2016   CO2 23 11/12/2016   INR 1.04 11/11/2016      PT/INR:  Recent Labs  11/11/16 0913  LABPROT 13.6  INR 1.04   Radiology: No results  found.   Assessment/Plan: S/P Procedure(s) (LRB): VIDEO BRONCHOSCOPY WITH INSERTION OF INTERBRONCHIAL VALVE (IBV) times three (N/A) On tube feeding uop increasing and cr decreasing Wbc decreasing  Minor improvement Continue support   [name] 11/12/2016 7:19 AM

## 2016-11-12 NOTE — Plan of Care (Signed)
Problem: Coping: Goal: Level of anxiety will decrease Outcome: Progressing Pt on diprivan gtt with intermittent analgesia  Problem: Nutritional: Goal: Intake of prescribed amount of daily calories will improve Outcome: Progressing Nutrition following pt. Now on tube feedings  Problem: Respiratory: Goal: Ability to maintain a clear airway and adequate ventilation will improve Outcome: Progressing Pt vented

## 2016-11-12 NOTE — Progress Notes (Signed)
ANTICOAGULATION CONSULT NOTE - Follow up [facility] for argatroban Indication: DVT  Allergies  Allergen Reactions  . Nickel Rash    Patient Measurements: Height: '5\' 5"'$  (165.1 cm) Weight: 158 lb 11.7 oz (72 kg) IBW/kg (Calculated) : 57 Heparin Dosing Weight: 68kg  Vital Signs: Temp: 98.5 F (36.9 C) (01/29 0400) Temp Source: Oral (01/29 0000) BP: 99/51 (01/29 0900) Pulse Rate: 91 (01/29 0900)  Labs:  Recent Labs  11/10/16 0412 11/11/16 0427 11/11/16 0500  11/11/16 0913 11/11/16 1339 11/11/16 1647 11/12/16 0448  HGB 9.9* 10.6*  --   --  10.7*  --   --  10.0*  HCT 28.1* 29.6*  --   --  29.4*  --   --  28.2*  PLT 79* 49*  --   --  41*  --   --  31*  APTT  --   --   --   < > 59* 60* 81* 78*  LABPROT  --   --   --   --  13.6  --   --   --   INR  --   --   --   --  1.04  --   --   --   HEPARINUNFRC 0.44  --  0.32  --   --   --   --   --   CREATININE 2.54* 2.10*  --   --  2.19*  --   --  1.97*  < > = values in this interval not displayed.  Estimated Creatinine Clearance: 26.1 mL/min (by C-G formula based on SCr of 1.97 mg/dL (H)).   Medical History: Past Medical History:  Diagnosis Date  . Arthritis   . Asthma    asthmatic bronchitis  . COPD (chronic obstructive pulmonary disease) ([place])   . Dyspnea   . History of kidney stones   . Hyperlipidemia   . Hypertension   . Pneumonia   . PONV (postoperative nausea and vomiting)    none with her hip surgery  . Restless leg syndrome   . 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 Platelet count has decreased and there was concern for HIT. The 4Ts score is about 4 but possibly lower as the DVT was diagnosed on 1/24 when heparin started.  Heparin antibody is negative and could likely rule out HIT. With concurrent thrombocytopenia argatroban may provide a better alternative to  heparin until the platelet count begins to recover (heparin has up to a 30% incidence of thrombocytopenia that is not HIT(type II) related.  -aPTT= 78 and at goal -plt= 31 (trend down), hg= 10 and stable   Goal of Therapy:  APTT 50-90 seconds Monitor platelets by anticoagulation protocol: Yes   Plan:  -No argatroban changes needed -Daily aPTT/CBC  Hildred Laser, Pharm D 11/12/2016 10:09 AM

## 2016-11-12 NOTE — Progress Notes (Signed)
RT note: RT changed arterial setup, replaced dressing, no redness of site.

## 2016-11-12 NOTE — Progress Notes (Signed)
PULMONARY / CRITICAL CARE MEDICINE   Name: [name] MRN: [id number] DOB: [birth year]    ADMISSION DATE:  10/29/2016 CONSULTATION DATE:  11/07/2016  REFERRING MD:  Lanelle Bal, M.D. / CVTS  CHIEF COMPLAINT:  Acute Hypoxic Respiratory Failure  BRIEF SUMMARY:  71 y.o. 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 Initially was ambulatory in the ICU and out of bed without difficulty. She later had worsening hypoxia and respiratory status prompting initiation of BiPAP support and PCCM consultation.  Course complicated by shock thought to be sepsis from PNA but no positive cultures, AF with RVR, bilateral LE DVT.  Intubated for hypoxic respiratory failure 1/26.    SUBJECTIVE:   No acute change overnight.  Scr slightly improved. Remains on levophed gtt but weaning slightly.  Plt continue to trend down.   VITAL SIGNS: BP (!) 116/57 (BP Location: Left Arm)   Pulse 87   Temp 98.5 F (36.9 C)   Resp (!) 30   Ht '5\' 5"'$  (1.651 m)   Wt 72 kg (158 lb 11.7 oz)   SpO2 97%   BMI 26.41 kg/m   HEMODYNAMICS: CVP:  [1 mmHg-8 mmHg] 8 mmHg  VENTILATOR SETTINGS: Vent Mode: PRVC FiO2 (%):  [40 %] 40 % Set Rate:  [27 bmp-28 bmp] 28 bmp Vt Set:  [340 mL] 340 mL PEEP:  [5 cmH20] 5 cmH20 Plateau Pressure:  [8 cmH20-18 cmH20] 16 cmH20  INTAKE / OUTPUT: I/O last 3 completed shifts: In: 4912.2 [I.V.:2627.2; Other:30; NG/GT:1690; IV [id number]:767] Out: 3419 [Urine:3235; Emesis/NG output:300; Stool:50; Chest Tube:455]  PHYSICAL EXAMINATION:  General:  Acutely ill appearing female, NAD  HEENT: MM pink/moist, ETT Neuro: sedated on vent, RASS -1, opens eyes to voice   CV: s1s2 rrr, no m/r/g PULM: even/non-labored, lungs bilaterally course, R Chest tube x 2, ongoing air leak FX:TKWI, non-tender, bsx4 active  Extremities: warm/dry, scant BLE  edema  Skin: no rashes or lesions    LABS: CBC Latest Ref Rng & Units 11/12/2016 11/11/2016 11/11/2016  WBC 4.0 - 10.5 K/uL 17.5(H) 25.2(H) 28.2(H)  Hemoglobin 12.0 - 15.0 g/dL 10.0(L) 10.7(L) 10.6(L)  Hematocrit 36.0 - 46.0 % 28.2(L) 29.4(L) 29.6(L)  Platelets 150 - 400 K/uL 31(L) 41(L) 49(L)   BMP Latest Ref Rng & Units 11/12/2016 11/11/2016 11/11/2016  Glucose 65 - 99 mg/dL 133(H) 145(H) 135(H)  BUN 6 - 20 mg/dL 48(H) 47(H) 47(H)  Creatinine 0.44 - 1.00 mg/dL 1.97(H) 2.19(H) 2.10(H)  Sodium 135 - 145 mmol/L 137 131(L) 129(L)  Potassium 3.5 - 5.1 mmol/L 2.9(L) 3.4(L) 2.8(L)  Chloride 101 - 111 mmol/L 106 100(L) 100(L)  CO2 22 - 32 mmol/L 23 19(L) 20(L)  Calcium 8.9 - 10.3 mg/dL 7.1(L) 6.6(L) 6.5(L)    STUDIES:  PFT 08/24/16 >> FVC 2.54 L (89%) FEV1 1.22 L (57%) FEV1/FVC 0.48 FEF 25-75 0.44 L (24%) negative bronchial dilator response TLC 5.19 L (120%) RV 155% ERV 76% DLCO uncorrected 52% PORT CXR 1/24 >>  Previously reviewed by me. No new left opacity. No appreciated pleural effusion. Right basilar opacity relatively unchanged but need be slightly worsened. Right-sided chest tubes remain in place.  TTE 1/24 >>  Poor quality study. Unable to assess RV. No pericardial effusion. LV with mild LVH but normal in size. EF 60-65%. Unable to assess wall motion. Grade 1 diastolic dysfunction. BILAT LE VENOUS DUPLEX 1/24 >>  There is evidence  of acute deep vein thrombosis involving the peroneal veins of the right and left lower extremities. Renal US 1/27 >> no hydronephrosis or stones  MICROBIOLOGY: MRSA PCR 1/11:  Negative Right Bronchial Washing 1/22:  Negative Blood Ctx x2 1/24 >>  Urine Ctx 1/24 >> negative  ANTIBIOTICS: Vancomycin 1/24 >> Zosyn 1/24 >>  SIGNIFICANT EVENTS: 1/15 - Admit w/ RUL Lobectomy 1/22 - Bronchoscopy with 3 right-sided endobronchial valves for persistent air leak 1/26 - Hypoxic respiratory failure, intubated  1/28 - Heparin d/c'd due to thrombocytopenia, HIT  panel pending.  Changed to argatroban.  LINES/TUBES: Right Chest Tubes 1/15 >> R IJ CLV 1/24 >> Foley 1/24 >> PIV  ASSESSMENT / PLAN:  71 y.o. female admitted for right upper lobectomy completed on 1/15. Patient had subsequent persistent air leak. Patient's shock and acute hypoxic respiratory failure likely due to evolving sepsis from PNA (NOS). Source likely from her lung given recent surgery. No other source identified at this time. No indication for stress dose steroids. Continue amiodarone and systemic anticoagulation > changed to argatroban 1/28 due to thrombocytopenia.    PULMONARY A: Acute Hypoxic Respiratory Failure secondary to PNA RUK NSCLCA - s/p RULobectomy  Persistent Right Pneumothorax & Air Leak Secondary to lobectomy - Chest tubes in place & endobronchial valves placed 1/22 Moderate to Severe COPD P:   Vent support - 8cc/kg  F/u CXR  F/u ABG Chest tubes per CVTS  Continue BD  Frequent turning    CARDIOVASCULAR A:  Septic Shock likely secondary to PNA Afib with RVR  Bilateral Calf Vein/Peroneal DVT H/O HTN, HLD  P:  Continue amiodarone  Continue argatroban with significant thrombocytopenia per pharmacy  Wean levophed as able for MAP >65 Monitor CVP Cortisol ok  Other mgmt per CVTS     RENAL A:   Acute Renal Failure - likely pre-renal / secondary to shock. Improving.  Hypomagnesemia Hyponatremia  P:  Replete K PRN  F/u chem  Monitor UOP closely    GASTROINTESTINAL A:   Nausea and Vomiting - resolved P:   NPO  Continue TF  PPO    HEMATOLOGIC A:   Anticoagulation secondary to DVT  Thrombocytopenia  R/O HIT - doubt  P: Continue off heparin gtt  continue argatroban per pharmacy  F/u CBC    INFECTIOUS A:   Septic Shock secondary to PNA   Leucocytosis  P:   Continue vanc/zosyn as above - D6/7  Trend pct  Follow culture data    ENDOCRINE A:   Hyperglycemia  P:   SSI    NEUROLOGIC A:   Acute Metabolic Encephalopathy   Post Operative Pain   P:   RASS Goal: 0 to -1  Change propofol to fentanyl gtt given ongoing hypotension    FAMILY  - Updates:  Multiple family members updated at bedside 1/29.    - Inter-disciplinary family meet or Palliative Care meeting due by:  Ongoing    CC Time: 30 minutes    Nickolas Madrid, NP 11/12/2016  11:19 AM Pager: [phone] or [phone]  STAFF NOTE: I, Merrie Roof, MD FACP have personally reviewed patient's available data, including medical history, events of note, physical examination and test results as part of my evaluation. I have discussed with resident/NP and other care providers such as pharmacist, RN and RRT. In addition, I personally evaluated patient and elicited key findings of: reduced BS, weak, edema, no bleeding noted, pcxr with rt apical PTX and large leak on CT rt, cvp 3-4, levophed  at 10, consider bolus and assess response to bolus on levophed requirement, may need vaso addition to levo, maintain argatroban, HITT pending, assess cortisol response, keep pos balance, wil llikley narrow abx in am, peep to 3, abg to follow, SBT likely in am even if low dose pressors required, consider amio to q12h The patient is critically ill with multiple organ systems failure and requires high complexity decision making for assessment and support, frequent evaluation and titration of therapies, application of advanced monitoring technologies and extensive interpretation of multiple databases.   Critical Care Time devoted to patient care services described in this note is 30  Minutes. This time reflects time of care of this signee: Merrie Roof, MD FACP. This critical care time does not reflect procedure time, or teaching time or supervisory time of PA/NP/Med student/Med Resident etc but could involve care discussion time. Rest per NP/medical resident whose note is outlined above and that I agree with   Lavon Paganini. Titus Mould, MD, [place] Pgr:  [facility] 11/12/2016 12:19 PM

## 2016-11-13 ENCOUNTER — Inpatient Hospital Stay (HOSPITAL_COMMUNITY): Payer: Medicare Other

## 2016-11-13 DIAGNOSIS — J9811 Atelectasis: Secondary | ICD-10-CM

## 2016-11-13 LAB — BASIC METABOLIC PANEL
Anion gap: 9 (ref 5–15)
BUN: 53 mg/dL — ABNORMAL HIGH (ref 6–20)
CO2: 24 mmol/L (ref 22–32)
Calcium: 7.7 mg/dL — ABNORMAL LOW (ref 8.9–10.3)
Chloride: 107 mmol/L (ref 101–111)
Creatinine, Ser: 1.84 mg/dL — ABNORMAL HIGH (ref 0.44–1.00)
GFR calc Af Amer: 31 mL/min — ABNORMAL LOW (ref >60)
GFR calc non Af Amer: 26 mL/min — ABNORMAL LOW (ref >60)
Glucose, Bld: 124 mg/dL — ABNORMAL HIGH (ref 65–99)
Potassium: 2.9 mmol/L — ABNORMAL LOW (ref 3.5–5.1)
Sodium: 140 mmol/L (ref 135–145)

## 2016-11-13 LAB — CBC
HCT: 26 % — ABNORMAL LOW (ref 36.0–46.0)
Hemoglobin: 9.1 g/dL — ABNORMAL LOW (ref 12.0–15.0)
MCH: 29.8 pg (ref 26.0–34.0)
MCHC: 35 g/dL (ref 30.0–36.0)
MCV: 85.2 fL (ref 78.0–100.0)
Platelets: 59 10*3/uL — ABNORMAL LOW (ref 150–400)
RBC: 3.05 MIL/uL — ABNORMAL LOW (ref 3.87–5.11)
RDW: 14.6 % (ref 11.5–15.5)
WBC: 14.2 10*3/uL — ABNORMAL HIGH (ref 4.0–10.5)

## 2016-11-13 LAB — GLUCOSE, CAPILLARY
Glucose-Capillary: 101 mg/dL — ABNORMAL HIGH (ref 65–99)
Glucose-Capillary: 107 mg/dL — ABNORMAL HIGH (ref 65–99)
Glucose-Capillary: 110 mg/dL — ABNORMAL HIGH (ref 65–99)
Glucose-Capillary: 115 mg/dL — ABNORMAL HIGH (ref 65–99)
Glucose-Capillary: 133 mg/dL — ABNORMAL HIGH (ref 65–99)
Glucose-Capillary: 136 mg/dL — ABNORMAL HIGH (ref 65–99)

## 2016-11-13 LAB — APTT: aPTT: 72 seconds — ABNORMAL HIGH (ref 24–36)

## 2016-11-13 MED ORDER — VITAL HIGH PROTEIN PO LIQD
1000.0000 mL | ORAL | Status: DC
  Administered 2016-11-13 – 2016-11-14 (×2): 1000 mL

## 2016-11-13 MED ORDER — GUAIFENESIN 100 MG/5ML PO SOLN
15.0000 mL | Freq: Four times a day (QID) | ORAL | Status: DC
  Administered 2016-11-13 – 2016-11-14 (×4): 300 mg
  Filled 2016-11-13 (×4): qty 15

## 2016-11-13 MED ORDER — AMIODARONE HCL IN DEXTROSE 360-4.14 MG/200ML-% IV SOLN
60.0000 mg/h | INTRAVENOUS | Status: AC
  Administered 2016-11-13 – 2016-11-14 (×2): 60 mg/h via INTRAVENOUS
  Filled 2016-11-13: qty 200

## 2016-11-13 MED ORDER — SODIUM CHLORIDE 0.9 % IV SOLN
30.0000 meq | Freq: Once | INTRAVENOUS | Status: AC
  Administered 2016-11-13: 30 meq via INTRAVENOUS
  Filled 2016-11-13: qty 15

## 2016-11-13 MED ORDER — AMIODARONE HCL IN DEXTROSE 360-4.14 MG/200ML-% IV SOLN
INTRAVENOUS | Status: AC
  Administered 2016-11-13: 60 mg/h via INTRAVENOUS
  Filled 2016-11-13: qty 200

## 2016-11-13 MED ORDER — AMIODARONE HCL IN DEXTROSE 360-4.14 MG/200ML-% IV SOLN
30.0000 mg/h | INTRAVENOUS | Status: DC
  Administered 2016-11-14 – 2016-11-16 (×5): 30 mg/h via INTRAVENOUS
  Filled 2016-11-13 (×5): qty 200

## 2016-11-13 MED ORDER — AMIODARONE HCL 200 MG PO TABS
400.0000 mg | ORAL_TABLET | Freq: Two times a day (BID) | ORAL | Status: DC
  Administered 2016-11-13: 400 mg

## 2016-11-13 MED ORDER — POTASSIUM CHLORIDE 20 MEQ/15ML (10%) PO SOLN
40.0000 meq | Freq: Once | ORAL | Status: AC
  Administered 2016-11-13: 40 meq
  Filled 2016-11-13: qty 30

## 2016-11-13 NOTE — Progress Notes (Signed)
Patient weaning since 0936, flipped back to [facility] at 1825.

## 2016-11-13 NOTE — Progress Notes (Signed)
ANTICOAGULATION CONSULT NOTE - Follow up [place] for argatroban Indication: DVT  Allergies  Allergen Reactions  . Nickel Rash    Patient Measurements: Height: '5\' 5"'$  (165.1 cm) Weight: 160 lb 7.9 oz (72.8 kg) IBW/kg (Calculated) : 57 Heparin Dosing Weight: 68kg  Vital Signs: Temp: 98.2 F (36.8 C) (01/30 0831) Temp Source: Oral (01/30 0831) BP: 108/56 (01/30 1000) Pulse Rate: 86 (01/30 1000)  Labs:  Recent Labs  11/11/16 0500 11/11/16 0913  11/11/16 1647 11/12/16 0448 11/13/16 0500  HGB  --  10.7*  --   --  10.0* 9.1*  HCT  --  29.4*  --   --  28.2* 26.0*  PLT  --  41*  --   --  31* 59*  APTT  --  59*  < > 81* 78* 72*  LABPROT  --  13.6  --   --   --   --   INR  --  1.04  --   --   --   --   HEPARINUNFRC 0.32  --   --   --   --   --   CREATININE  --  2.19*  --   --  1.97* 1.84*  < > = values in this interval not displayed.  Estimated Creatinine Clearance: 28 mL/min (by C-G formula based on SCr of 1.84 mg/dL (H)).    Assessment: 38 yoF with dopplers positive for bilateral peroneal DVT also with AFib previously on heparin. Platelet count has decreased and there was concern for HIT. The 4Ts score is about 4 but possibly lower as the DVT was diagnosed on 1/24 when heparin started.  Heparin antibody is negative and could likely rule out HIT. With concurrent thrombocytopenia argatroban may provide a better alternative to heparin until the platelet count begins to recover (heparin has up to a 30% incidence of thrombocytopenia that is not HIT(type II) related.  -aPTT= 72 and at goal -plt= 51 (trend up), hg= 9.1 and stable   Goal of Therapy:  APTT 50-90 seconds Monitor platelets by anticoagulation protocol: Yes   Plan:  -No argatroban changes needed -Daily aPTT/CBC  Hildred Laser, Pharm D 11/13/2016 10:18 AM

## 2016-11-13 NOTE — Progress Notes (Signed)
[address]       [place] [address]             [phone]        CARDIOTHORACIC SURGERY PROGRESS NOTE   R8 Days Post-Op Procedure(s) (LRB): VIDEO BRONCHOSCOPY WITH INSERTION OF INTERBRONCHIAL VALVE (IBV) times three (N/A)  Subjective: On the ventilator, sedation decreased and patient waved to husband.   Objective: Vital signs: BP Readings from Last 1 Encounters:  11/13/16 (!) 102/52   Pulse Readings from Last 1 Encounters:  11/13/16 88   Resp Readings from Last 1 Encounters:  11/13/16 19   Temp Readings from Last 1 Encounters:  11/13/16 98.2 F (36.8 C) (Oral)    Hemodynamics: CVP:  [3 mmHg-11 mmHg] 4 mmHg  Physical Exam:  General:   On ventilator, calm and easily roused  Breath sounds: Ventilator sounds with bilateral rhonchi  Heart sounds:  Regular rate and rhythm. No murmur, gallop or rub  Incisions:  Dressings clean and dry  Abdomen:  Soft and nontender, normoactive bowel sounds  Extremities:  Warm, no edema   Intake/Output from previous day: 01/29 0701 - 01/30 0700 In: 3278.8 [I.V.:893.9; NG/GT:1320; IV Piggyback:964.9] Out: 3135 [Urine:1995; Stool:900; Chest Tube:240] Intake/Output this shift: Total I/O In: 304.4 [I.V.:39.4; IV Piggyback:265] Out: 220 [Urine:220]  Lab Results:  CBC: Recent Labs  11/12/16 0448 11/13/16 0500  WBC 17.5* 14.2*  HGB 10.0* 9.1*  HCT 28.2* 26.0*  PLT 31* 59*    BMET:  Recent Labs  11/12/16 0448 11/13/16 0500  NA 137 140  K 2.9* 2.9*  CL 106 107  CO2 23 24  GLUCOSE 133* 124*  BUN 48* 53*  CREATININE 1.97* 1.84*  CALCIUM 7.1* 7.7*     PT/INR:   Recent Labs  11/11/16 0913  LABPROT 13.6  INR 1.04    CBG (last 3)   Recent Labs  11/12/16 2332 11/13/16 0408 11/13/16 0828  GLUCAP 111* 107* 136*    ABG    Component Value Date/Time   PHART 7.376 11/12/2016 1519   PCO2ART 45.1 11/12/2016 1519   PO2ART 119.0 (H) 11/12/2016 1519   HCO3 26.4 11/12/2016 1519   TCO2 28  11/12/2016 1519   ACIDBASEDEF 5.0 (H) 11/11/2016 0423   O2SAT 99.0 11/12/2016 1519    CXR:  PORTABLE CHEST 1 VIEW  COMPARISON:  11/12/2016 .  11/10/2016 .  FINDINGS: Right chest tubes are in stable position.  Endotracheal tube and feeding tube in stable position. Stable right pneumothorax. Heart size stable. Prior right upper lobectomy. Interbronchial valves again noted . Low lung volumes with progressive atelectasis. Progressive bibasilar infiltrates are present. Bilateral pleural effusions. Component congestive heart failure cannot be excluded. Right chest wall subcutaneous emphysema again noted .  IMPRESSION: 1. Two right chest tubes in stable position with stable right apical pneumothorax. Stable mild right chest wall subcutaneous emphysema noted. Remaining lines and tubes are also in stable position.  2. Progressive bibasilar atelectasis and infiltrates. Small bilateral pleural effusions noted on today's exam. Component congestive heart failure cannot be excluded.  3. Prior right upper lobectomy. Interbronchial valves again noted in stable position.   Electronically Signed   By: Marcello Moores  Register   On: 11/13/2016 08:10  Assessment/Plan: S/P Procedure(s) (LRB): VIDEO BRONCHOSCOPY WITH INSERTION OF INTERBRONCHIAL VALVE (IBV) times three (N/A)  1 CT - 240 ml output, serous fluid, air leak and tidaling present - continue for now 2 Renal - UOP good, clear, foley catheter in place. Creatinine decreased  3 WBC down to 14.2 from 17.5 yesterday - continue Vancomycin and Zosyn 4 Hgb dropped to 9.1 from 10 yesterday, most likely is dilutional. No obvious source of bleeding - continue to monitor 5 Patient is off pressors, BP improving 6 Continue supportive care   Brigid Re, PA-S 11/13/2016 8:40 AM  Slowly improving more alert today  Weaning vent some  I have seen and examined [name] and agree with the above assessment  and plan.  [name]  MD Bee[place] [phone] Office [phone] 11/13/2016 6:52 PM

## 2016-11-13 NOTE — Progress Notes (Signed)
eLink Physician-Brief Progress Note Patient Name: [name] DOB: February 02, 1946 MRN: [id number]   Date of Service  11/13/2016  HPI/Events of Note  Afib with RVR, patient trasitioned to oral amio, but HR now in 150-160's Afib with RVR  eICU Interventions  Re-start AMiodarone infusion     Intervention Category Major Interventions: Acid-Base disturbance - evaluation and management;Arrhythmia - evaluation and management  Reianna Batdorf 11/13/2016, 7:09 PM

## 2016-11-13 NOTE — Progress Notes (Signed)
PULMONARY / CRITICAL CARE MEDICINE   Name: [name] MRN: [id number] DOB: Jun 24, 1946    ADMISSION DATE:  10/29/2016 CONSULTATION DATE:  11/07/2016  REFERRING MD:  Lanelle Bal, M.D. / CVTS  CHIEF COMPLAINT:  Acute Hypoxic Respiratory Failure  BRIEF SUMMARY:  71 y.o. 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 Initially was ambulatory in the ICU and out of bed without difficulty. She later had worsening hypoxia and respiratory status prompting initiation of BiPAP support and PCCM consultation.  Course complicated by shock thought to be sepsis from PNA but no positive cultures, AF with RVR, bilateral LE DVT.  Intubated for hypoxic respiratory failure 1/26.    SUBJECTIVE:   No acute change overnight.   Off pressors  VITAL SIGNS: BP (!) 102/52   Pulse 88   Temp 98.2 F (36.8 C) (Oral)   Resp 19   Ht '5\' 5"'$  (1.651 m)   Wt 160 lb 7.9 oz (72.8 kg)   SpO2 98%   BMI 26.71 kg/m   HEMODYNAMICS: CVP:  [3 mmHg-11 mmHg] 4 mmHg  VENTILATOR SETTINGS: Vent Mode: PRVC FiO2 (%):  [40 %] 40 % Set Rate:  [28 bmp] 28 bmp Vt Set:  [340 mL] 340 mL PEEP:  [3 cmH20-5 cmH20] 3 cmH20 Plateau Pressure:  [13 cmH20-18 cmH20] 14 cmH20  INTAKE / OUTPUT: I/O last 3 completed shifts: In: 4672.5 [I.V.:1737.6; Other:100; NG/GT:1870; IV Piggyback:964.9] Out: 4430 [Urine:3070; Stool:900; Chest Tube:460]  PHYSICAL EXAMINATION:  General:  Acutely ill appearing female, NAD , sedated HEENT: MM pink/moist, ETT, bloody et secretions Neuro: sedated on vent, RASS -1, opens eyes to voice   CV: s1s2 rrr, no m/r/g PULM: even/non-labored, lungs bilaterally course, R Chest tube x 2, ongoing air leak VO:HYWV, non-tender, bsx4 active  Extremities: warm/dry, scant BLE edema  Skin: no rashes or lesions    LABS: CBC Latest Ref Rng & Units 11/13/2016  11/12/2016 11/11/2016  WBC 4.0 - 10.5 K/uL 14.2(H) 17.5(H) 25.2(H)  Hemoglobin 12.0 - 15.0 g/dL 9.1(L) 10.0(L) 10.7(L)  Hematocrit 36.0 - 46.0 % 26.0(L) 28.2(L) 29.4(L)  Platelets 150 - 400 K/uL 59(L) 31(L) 41(L)   BMP Latest Ref Rng & Units 11/13/2016 11/12/2016 11/11/2016  Glucose 65 - 99 mg/dL 124(H) 133(H) 145(H)  BUN 6 - 20 mg/dL 53(H) 48(H) 47(H)  Creatinine 0.44 - 1.00 mg/dL 1.84(H) 1.97(H) 2.19(H)  Sodium 135 - 145 mmol/L 140 137 131(L)  Potassium 3.5 - 5.1 mmol/L 2.9(L) 2.9(L) 3.4(L)  Chloride 101 - 111 mmol/L 107 106 100(L)  CO2 22 - 32 mmol/L 24 23 19(L)  Calcium 8.9 - 10.3 mg/dL 7.7(L) 7.1(L) 6.6(L)    STUDIES:  PFT 08/24/16 >> FVC 2.54 L (89%) FEV1 1.22 L (57%) FEV1/FVC 0.48 FEF 25-75 0.44 L (24%) negative bronchial dilator response TLC 5.19 L (120%) RV 155% ERV 76% DLCO uncorrected 52% PORT CXR 1/24 >>  Previously reviewed by me. No new left opacity. No appreciated pleural effusion. Right basilar opacity relatively unchanged but need be slightly worsened. Right-sided chest tubes remain in place.  TTE 1/24 >>  Poor quality study. Unable to assess RV. No pericardial effusion. LV with mild LVH but normal in size. EF 60-65%. Unable to assess wall motion. Grade 1 diastolic dysfunction. BILAT LE VENOUS DUPLEX 1/24 >>  There is evidence of acute deep vein thrombosis involving the peroneal veins of the right and left lower extremities.  Renal US 1/27 >> no hydronephrosis or stones  MICROBIOLOGY: MRSA PCR 1/11:  Negative Right Bronchial Washing 1/22:  Negative Blood Ctx x2 1/24 >>  Urine Ctx 1/24 >> negative  ANTIBIOTICS: Vancomycin 1/24 >> Zosyn 1/24 >>  SIGNIFICANT EVENTS: 1/15 - Admit w/ RUL Lobectomy 1/22 - Bronchoscopy with 3 right-sided endobronchial valves for persistent air leak 1/26 - Hypoxic respiratory failure, intubated  1/28 - Heparin d/c'd due to thrombocytopenia, HIT panel pending.  Changed to argatroban. 1/30 still with air leak  LINES/TUBES: Right Chest Tubes  1/15 >> R IJ CLV 1/24 >> Foley 1/24 >> PIV  ASSESSMENT / PLAN:  71 y.o. female admitted for right upper lobectomy completed on 1/15. Patient had subsequent persistent air leak. Patient's shock and acute hypoxic respiratory failure likely due to evolving sepsis from PNA (NOS). Source likely from her lung given recent surgery. No other source identified at this time. No indication for stress dose steroids. Continue amiodarone and systemic anticoagulation > changed to argatroban 1/28 due to thrombocytopenia.    PULMONARY A: Acute Hypoxic Respiratory Failure secondary to PNA RUK NSCLCA - s/p RULobectomy  Persistent Right Pneumothorax & Air Leak Secondary to lobectomy - Chest tubes in place & endobronchial valves placed 1/22 Moderate to Severe COPD P:   Vent support - 8cc/kg  F/u CXR  F/u ABG Chest tubes per CVTS ++air leak Continue BD  Wean as tolerated   CARDIOVASCULAR A:  Septic Shock likely secondary to PNA Afib with RVR  Bilateral Calf Vein/Peroneal DVT H/O HTN, HLD  P:  Continue amiodarone via po route Continue argatroban with significant thrombocytopenia per pharmacy  Wean levophed as able for MAP >65 Monitor CVP Cortisol ok  Other mgmt per CVTS  Off pressors 1/30    RENAL Lab Results  Component Value Date   CREATININE 1.84 (H) 11/13/2016   CREATININE 1.97 (H) 11/12/2016   CREATININE 2.19 (H) 11/11/2016   CREATININE 0.9 09/11/2016    Recent Labs Lab 11/11/16 0913 11/12/16 0448 11/13/16 0500  K 3.4* 2.9* 2.9*     A:   Acute Renal Failure - likely pre-renal / secondary to shock. Improving.  Hypomagnesemia Hyponatremia  P:  Replete K PRN  F/u chem  Monitor UOP closely    GASTROINTESTINAL A:   Nausea and Vomiting - resolved P:   NPO  Continue TF  PPO    HEMATOLOGIC A:   Anticoagulation secondary to DVT  Thrombocytopenia 31->59 R/O HIT - doubt  P: Continue off heparin gtt  continue argatroban per pharmacy  F/u CBC    INFECTIOUS A:    Septic Shock secondary to PNA   Leucocytosis  P:   Continue vanc/zosyn as above - D7/x  Trend pct  Follow culture data all negative therefore consider dc abx after today's (1-30)dose   ENDOCRINE CBG (last 3)   Recent Labs  11/12/16 2332 11/13/16 0408 11/13/16 0828  GLUCAP 111* 107* 136*     A:   Hyperglycemia  P:   SSI    NEUROLOGIC A:   Acute Metabolic Encephalopathy  Post Operative Pain   P:   RASS Goal: 0 to -1  Change propofol to fentanyl gtt given ongoing hypotension  Rass 0 1/30 0745   FAMILY  - Updates:  Multiple family members updated at bedside 1/29.  1/30 no family  - Inter-disciplinary family meet or Palliative Care meeting due by:  Ongoing    CC Time: 30 minutes   Richardson Landry Minor ACNP Maryanna Shape PCCM Pager [phone] till  3 pm If no answer page [phone] 11/13/2016, 8:52 AM   STAFF NOTE: I, Merrie Roof, MD FACP have personally reviewed patient's available data, including medical history, events of note, physical examination and test results as part of my evaluation. I have discussed with resident/NP and other care providers such as pharmacist, RN and RRT. In addition, I personally evaluated patient and elicited key findings of: awakens, FC, less coarse BS left, leak remains, off pressors, pcxr with unchanged ptx, slight better aeration rt base, after bolus came off levophed, cvp 6 now but no pressors, no bolus further, was even  Balance last 24 hours, renal fxn atn continues to improve, would wean cpap 3, ps 5-10 today, k noted and replaced, will need additional K supp, plat is rising, ELISA HITT appears neg, await sra  But with neg elisa this is negative, argatroban remains for dvt, remains culture neg, dc vanc, would favor empiric nosocomial abx course covering empiric pseudomonas etc The patient is critically ill with multiple organ systems failure and requires high complexity decision making for assessment and support, frequent evaluation and  titration of therapies, application of advanced monitoring technologies and extensive interpretation of multiple databases.   Critical Care Time devoted to patient care services described in this note is 30 Minutes. This time reflects time of care of this signee: Merrie Roof, MD FACP. This critical care time does not reflect procedure time, or teaching time or supervisory time of PA/NP/Med student/Med Resident etc but could involve care discussion time. Rest per NP/medical resident whose note is outlined above and that I agree with   Lavon Paganini. Titus Mould, MD, [place] Pgr: [facility] 11/13/2016 10:47 AM

## 2016-11-14 ENCOUNTER — Inpatient Hospital Stay (HOSPITAL_COMMUNITY): Payer: Medicare Other

## 2016-11-14 DIAGNOSIS — Z978 Presence of other specified devices: Secondary | ICD-10-CM

## 2016-11-14 LAB — GLUCOSE, CAPILLARY
Glucose-Capillary: 100 mg/dL — ABNORMAL HIGH (ref 65–99)
Glucose-Capillary: 100 mg/dL — ABNORMAL HIGH (ref 65–99)
Glucose-Capillary: 116 mg/dL — ABNORMAL HIGH (ref 65–99)
Glucose-Capillary: 133 mg/dL — ABNORMAL HIGH (ref 65–99)
Glucose-Capillary: 83 mg/dL (ref 65–99)
Glucose-Capillary: 91 mg/dL (ref 65–99)

## 2016-11-14 LAB — CBC
HCT: 24 % — ABNORMAL LOW (ref 36.0–46.0)
Hemoglobin: 8.3 g/dL — ABNORMAL LOW (ref 12.0–15.0)
MCH: 30.2 pg (ref 26.0–34.0)
MCHC: 34.6 g/dL (ref 30.0–36.0)
MCV: 87.3 fL (ref 78.0–100.0)
Platelets: 109 10*3/uL — ABNORMAL LOW (ref 150–400)
RBC: 2.75 MIL/uL — ABNORMAL LOW (ref 3.87–5.11)
RDW: 15.1 % (ref 11.5–15.5)
WBC: 12.2 10*3/uL — ABNORMAL HIGH (ref 4.0–10.5)

## 2016-11-14 LAB — BASIC METABOLIC PANEL
Anion gap: 9 (ref 5–15)
BUN: 54 mg/dL — ABNORMAL HIGH (ref 6–20)
CALCIUM: 7.9 mg/dL — AB (ref 8.9–10.3)
CO2: 24 mmol/L (ref 22–32)
CREATININE: 1.7 mg/dL — AB (ref 0.44–1.00)
Chloride: 109 mmol/L (ref 101–111)
GFR calc non Af Amer: 29 mL/min — ABNORMAL LOW (ref >60)
GFR, EST AFRICAN AMERICAN: 34 mL/min — AB (ref >60)
Glucose, Bld: 124 mg/dL — ABNORMAL HIGH (ref 65–99)
Potassium: 3.2 mmol/L — ABNORMAL LOW (ref 3.5–5.1)
SODIUM: 142 mmol/L (ref 135–145)

## 2016-11-14 LAB — MAGNESIUM: MAGNESIUM: 1.8 mg/dL (ref 1.7–2.4)

## 2016-11-14 LAB — APTT: aPTT: 64 seconds — ABNORMAL HIGH (ref 24–36)

## 2016-11-14 LAB — PHOSPHORUS: PHOSPHORUS: 2.8 mg/dL (ref 2.5–4.6)

## 2016-11-14 MED ORDER — ORAL CARE MOUTH RINSE: 15.0000 mL | Freq: Two times a day (BID) | OROMUCOSAL | Status: DC

## 2016-11-14 MED ORDER — CHLORHEXIDINE GLUCONATE 0.12% ORAL RINSE (MEDLINE KIT)
15.0000 mL | Freq: Two times a day (BID) | OROMUCOSAL | Status: DC
  Administered 2016-11-15: 15 mL via OROMUCOSAL

## 2016-11-14 MED ORDER — SODIUM CHLORIDE 0.9 % IV SOLN
30.0000 meq | Freq: Once | INTRAVENOUS | Status: AC
  Administered 2016-11-14: 30 meq via INTRAVENOUS
  Filled 2016-11-14: qty 15

## 2016-11-14 NOTE — Progress Notes (Signed)
Pt states she "feels short of breath", PRN xopenex given and RT made away. Emotional support given, will continue to monitor closely.

## 2016-11-14 NOTE — Progress Notes (Addendum)
PULMONARY / CRITICAL CARE MEDICINE   Name: BLAKELY MARANAN MRN: [id number] DOB: July 20, 1946    ADMISSION DATE:  10/29/2016 CONSULTATION DATE:  11/07/2016  REFERRING MD:  Lanelle Bal, M.D. / CVTS  CHIEF COMPLAINT:  Acute Hypoxic Respiratory Failure  BRIEF SUMMARY:  71 y.o. 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 Initially was ambulatory in the ICU and out of bed without difficulty. She later had worsening hypoxia and respiratory status prompting initiation of BiPAP support and PCCM consultation.  Course complicated by shock thought to be sepsis from PNA but no positive cultures, AF with RVR, bilateral LE DVT.  Intubated for hypoxic respiratory failure 1/26.    SUBJECTIVE:   Fib rvr, amio drip, weaned well 1/30, pct increased, chest tube this am to suction  VITAL SIGNS: BP (!) 148/55 (BP Location: Left Arm)   Pulse 82   Temp 98.6 F (37 C) (Oral)   Resp 18   Ht _0  (1.651 m)   Wt 73.7 kg (162 lb 7.7 oz)   SpO2 95%   BMI 27.04 kg/m   HEMODYNAMICS: CVP:  [4 mmHg-12 mmHg] 7 mmHg  VENTILATOR SETTINGS: Vent Mode: CPAP;PSV FiO2 (%):  [40 %] 40 % Set Rate:  [28 bmp] 28 bmp Vt Set:  [340 mL] 340 mL PEEP:  [3 cmH20] 3 cmH20 Pressure Support:  [5 cmH20-8 cmH20] 5 cmH20  INTAKE / OUTPUT: I/O last 3 completed shifts: In: 3732.3 [I.V.:912.3; Other:100; NG/GT:1990; IV HYIFOYDXA:128] Out: 7867 [Urine:2905; Stool:1100; Chest Tube:380]  PHYSICAL EXAMINATION:  General:  Acutely ill, awake, fc HEENT: ETT Neuro: rass 0 , wide awake, improved alertness   CV: s1s2 rrr, no m/r/g PULM: leak, ronchi, reduced coarse rt base EH:MCNO, non-tender, bsx4 active  Extremities: warm/dry, scant BLE edema  Skin: no rashes or lesions    LABS: CBC Latest Ref Rng & Units 11/14/2016 11/13/2016 11/12/2016  WBC 4.0 - 10.5 K/uL 12.2(H)  14.2(H) 17.5(H)  Hemoglobin 12.0 - 15.0 g/dL 8.3(L) 9.1(L) 10.0(L)  Hematocrit 36.0 - 46.0 % 24.0(L) 26.0(L) 28.2(L)  Platelets 150 - 400 K/uL 109(L) 59(L) 31(L)   BMP Latest Ref Rng & Units 11/14/2016 11/13/2016 11/12/2016  Glucose 65 - 99 mg/dL 124(H) 124(H) 133(H)  BUN 6 - 20 mg/dL 54(H) 53(H) 48(H)  Creatinine 0.44 - 1.00 mg/dL 1.70(H) 1.84(H) 1.97(H)  Sodium 135 - 145 mmol/L 142 140 137  Potassium 3.5 - 5.1 mmol/L 3.2(L) 2.9(L) 2.9(L)  Chloride 101 - 111 mmol/L 109 107 106  CO2 22 - 32 mmol/L _1 Calcium 8.9 - 10.3 mg/dL 7.9(L) 7.7(L) 7.1(L)    STUDIES:  PFT 08/24/16 >> FVC 2.54 L (89%) FEV1 1.22 L (57%) FEV1/FVC 0.48 FEF 25-75 0.44 L (24%) negative bronchial dilator response TLC 5.19 L (120%) RV 155% ERV 76% DLCO uncorrected 52% PORT CXR 1/24 >>  Previously reviewed by me. No new left opacity. No appreciated pleural effusion. Right basilar opacity relatively unchanged but need be slightly worsened. Right-sided chest tubes remain in place.  TTE 1/24 >>  Poor quality study. Unable to assess RV. No pericardial effusion. LV with mild LVH but normal in size. EF 60-65%. Unable to assess wall motion. Grade 1 diastolic dysfunction. BILAT LE VENOUS DUPLEX 1/24 >>  There is evidence of acute deep vein thrombosis involving the peroneal veins of the right and left lower extremities. Renal US 1/27 >> no hydronephrosis  or stones  MICROBIOLOGY: MRSA PCR 1/11:  Negative Right Bronchial Washing 1/22:  Negative Blood Ctx x2 1/24 >>  Urine Ctx 1/24 >> negative  ANTIBIOTICS: Vancomycin 1/24 >>1/30 Zosyn 1/24 >>  SIGNIFICANT EVENTS: 1/15 - Admit w/ RUL Lobectomy 1/22 - Bronchoscopy with 3 right-sided endobronchial valves for persistent air leak 1/26 - Hypoxic respiratory failure, intubated  1/28 - Heparin d/c'd due to thrombocytopenia, HIT panel pending.  Changed to argatroban. 1/30 still with air leak, weaning well  LINES/TUBES: Right Chest Tubes 1/15 >> R IJ CLV 1/24 >> Foley 1/24  >> PIV  ASSESSMENT / PLAN:  71 y.o. female admitted for right upper lobectomy completed on 1/15. Patient had subsequent persistent air leak. Patient's shock and acute hypoxic respiratory failure likely due to evolving sepsis from PNA (NOS). Source likely from her lung given recent surgery. No other source identified at this time. No indication for stress dose steroids. Continue amiodarone and systemic anticoagulation > changed to argatroban 1/28 due to thrombocytopenia.    PULMONARY A: Acute Hypoxic Respiratory Failure secondary to PNA RUK NSCLCA - s/p RULobectomy  Persistent Right Pneumothorax & Air Leak Secondary to lobectomy - Chest tubes in place & endobronchial valves placed 1/22 Moderate to Severe COPD P:   Chest tube was placed back to suciton She is weaning well now and would LOVE the opportunity top get her off pos pressure with leak and observe ptx / leak off Wean cpap 3 ps 5, goal 1 hour, assess rsbi pcxr in am Neg balance noted, to even  CARDIOVASCULAR A:  Septic Shock likely secondary to PNA Afib with RVR  Bilateral Calf Vein/Peroneal DVT H/O HTN, HLD  P:  Continue amiodarone back to iv for further load Continue argatroban with significant thrombocytopenia per pharmacy  Wean levophed as able for MAP >60- remains off Monitor CVP- 7 If shock, add neo Dc aline soon  RENAL Lab Results  Component Value Date   CREATININE 1.70 (H) 11/14/2016   CREATININE 1.84 (H) 11/13/2016   CREATININE 1.97 (H) 11/12/2016   CREATININE 0.9 09/11/2016    Recent Labs Lab 11/12/16 0448 11/13/16 0500 11/14/16 0345  K 2.9* 2.9* 3.2*     A:   Acute Renal Failure - likely pre-renal / secondary to shock. Improving daily Hyponatremia resolved P:  Replete K Even balance kvo Was neg on own, may need lasix start in am if not neg 500 cc on own again  GASTROINTESTINAL A:   Nausea and Vomiting - resolved P:   Continue TF  PPO   HEMATOLOGIC A:   Anticoagulation secondary to  DVT  Thrombocytopenia 31->59 R/O HIT - doubt  P: continue argatroban per pharmacy F/u CBC  Elisa neg HITT - Not HITT  INFECTIOUS A:   Septic Shock secondary to PNA   Leucocytosis resolving P:   Will give zosyn 8 days empiric  ENDOCRINE CBG (last 3)   Recent Labs  11/13/16 1945 11/13/16 2339 11/14/16 0358  GLUCAP 115* 133* 116*     A:   Hyperglycemia  P:   SSI   NEUROLOGIC A:   Acute Metabolic Encephalopathy  Post Operative Pain   P:   RASS Goal: 0 met fent prn  FAMILY  - Updates:  Multiple family members updated by me son etc 1/31  - Inter-disciplinary family meet or Palliative Care meeting due by:  Ongoing    CC Time: 30 minutes   Lavon Paganini. Titus Mould, MD, [place] Pgr: [facility] 11/14/2016 8:45 AM

## 2016-11-14 NOTE — Progress Notes (Signed)
ANTICOAGULATION CONSULT NOTE - Follow up [place] for argatroban Indication: DVT  Allergies  Allergen Reactions  . Nickel Rash    Patient Measurements: Height: '5\' 5"'$  (165.1 cm) Weight: 162 lb 7.7 oz (73.7 kg) IBW/kg (Calculated) : 57 Heparin Dosing Weight: 68kg  Vital Signs: Temp: 98.1 F (36.7 C) (01/31 0800) Temp Source: Oral (01/31 0800) BP: 120/68 (01/31 1100) Pulse Rate: 98 (01/31 1100)  Labs:  Recent Labs  11/12/16 0448 11/13/16 0500 11/14/16 0345  HGB 10.0* 9.1* 8.3*  HCT 28.2* 26.0* 24.0*  PLT 31* 59* 109*  APTT 78* 72* 64*  CREATININE 1.97* 1.84* 1.70*    Estimated Creatinine Clearance: 30.5 mL/min (by C-G formula based on SCr of 1.7 mg/dL (H)).    Assessment: 21 yoF with dopplers positive for bilateral peroneal DVT also with AFib previously on heparin. Platelet count has decreased and there was concern for HIT. The 4Ts score is about 4 but possibly lower as the DVT was diagnosed on 1/24 when heparin started.  Heparin antibody is negative. Spoke with Dr. Titus Mould and continuing argatroban for now and will consider change to heparin if platelets continue to trend up.   -aPTT= 64 and at goal -plt= 109 (trend up), hg= 8.3 and stable   Goal of Therapy:  APTT 50-90 seconds Monitor platelets by anticoagulation protocol: Yes   Plan:  -No argatroban changes needed -Daily aPTT/CBC  Hildred Laser, Pharm D 11/14/2016 12:00 PM

## 2016-11-14 NOTE — Progress Notes (Signed)
Patient ID: [name], female   DOB: 04-29-1946, 71 y.o.   MRN: [id number] SICU Evening Rounds  Hemodynamically stable, sinus 80's on amio  Extubated this am. sats 100%  Diuresing well. CVP 2-3  CT output low.  NPO with NG tube in.

## 2016-11-14 NOTE — Progress Notes (Signed)
30 cc of fentanyl wasted in sink, witnessed by Pierce Crane., RN.

## 2016-11-14 NOTE — Procedures (Signed)
Extubation Procedure Note  Patient Details:   Name: [name] DOB: [birth year] MRN: [id number]   Airway Documentation:     Evaluation  O2 sats: stable throughout Complications: No apparent complications Patient did tolerate procedure well. Bilateral Breath Sounds: Clear, Diminished   Yes   Pt extubated per MD order.  Pt tolerated procedure well.  Pt placed on previous HFNC (salter, wall unit) at 8lpm with sats of 98%.  BBS clear and diminished.  RN at bedside.  Emergency equipment is at bedside.  RT will continue to monitor.  Pierre Bali 11/14/2016, 9:43 AM

## 2016-11-14 NOTE — Progress Notes (Addendum)
      [address]       [place] [address]             [phone]      9 Days Post-Op Procedure(s) (LRB): VIDEO BRONCHOSCOPY WITH INSERTION OF INTERBRONCHIAL VALVE (IBV) times three (N/A) Subjective: Alert on vent  Objective: Vital signs in last 24 hours: Temp:  [97.9 F (36.6 C)-99.5 F (37.5 C)] 98.6 F (37 C) (01/31 0400) Pulse Rate:  [77-158] 82 (01/31 0800) Cardiac Rhythm: Normal sinus rhythm (01/31 0400) Resp:  [15-29] 18 (01/31 0800) BP: (94-148)/(50-82) 148/55 (01/31 0800) SpO2:  [91 %-98 %] 95 % (01/31 0800) Arterial Line BP: (124-184)/(45-71) 163/56 (01/31 0700) FiO2 (%):  [40 %] 40 % (01/31 0800) Weight:  [73.7 kg (162 lb 7.7 oz)] 73.7 kg (162 lb 7.7 oz) (01/31 0600)  Hemodynamic parameters for last 24 hours: CVP:  [4 mmHg-12 mmHg] 5 mmHg  Intake/Output from previous day: 01/30 0701 - 01/31 0700 In: 2862.2 [I.V.:642.2; NG/GT:1440; IV Piggyback:680] Out: 6295 [Urine:2245; Stool:700; Chest Tube:290] Intake/Output this shift: No intake/output data recorded.  General appearance: alert, cooperative and no distress Heart: irregularly irregular Lungs: bilateral rhonchi Abdomen: soft, non-tender; bowel sounds normal; no masses,  no organomegaly Extremities: 2+ nonpitting edema in hands and feet.  Wound: clean and dry  Lab Results:  Recent Labs  11/13/16 0500 11/14/16 0345  WBC 14.2* 12.2*  HGB 9.1* 8.3*  HCT 26.0* 24.0*  PLT 59* 109*   BMET:  Recent Labs  11/13/16 0500 11/14/16 0345  NA 140 142  K 2.9* 3.2*  CL 107 109  CO2 24 24  GLUCOSE 124* 124*  BUN 53* 54*  CREATININE 1.84* 1.70*  CALCIUM 7.7* 7.9*    PT/INR:  Recent Labs  11/11/16 0913  LABPROT 13.6  INR 1.04   ABG    Component Value Date/Time   PHART 7.376 11/12/2016 1519   HCO3 26.4 11/12/2016 1519   TCO2 28 11/12/2016 1519   ACIDBASEDEF 5.0 (H) 11/11/2016 0423   O2SAT 99.0 11/12/2016 1519   CBG (last 3)   Recent Labs  11/13/16 1945 11/13/16 2339  11/14/16 0358  GLUCAP 115* 133* 116*    Assessment/Plan: S/P Procedure(s) (LRB): VIDEO BRONCHOSCOPY WITH INSERTION OF INTERBRONCHIAL VALVE (IBV) times three (N/A)  1 CT - 290 ml output, serous fluid, air leak and tidaling present - continue for now 2 Renal - UOP good, clear, foley catheter in place. Creatinine decreased, now 1.70. Replace potassium per protocol.  3 WBC down to 14.2 from 17.5 yesterday - continue Vancomycin and Zosyn 4 Hgb dropped to 8.3 from 9.1 yesterday, will discuss with attending physician. No obvious source of bleeding - continue to monitor 5 Patient is off pressors, BP improving. On Amio gtt for atrial fibrillation. On argatroban 6 Continue supportive care   LOS: 16 days    [name] 11/14/2016 Discussed with CCM, has tolerated extubation so far  I have seen and examined [name] and agree with the above assessment  and plan.  [name] MD [facility] [phone] Office [phone] 11/14/2016 5:51 PM

## 2016-11-14 NOTE — Progress Notes (Signed)
Nutrition Follow Up  DOCUMENTATION CODES:   Not applicable  INTERVENTION:    If resumption of TF warranted recommend:   Vital AF 1.2 at goal rate of 60 ml/hr (1440 ml per day) to provide 1728 kcals, 108 gm protein, 1168 ml of free water daily  NUTRITION DIAGNOSIS:   Inadequate oral intake related to inability to eat as evidenced by NPO status, ongoing  GOAL:   Patient will meet greater than or equal to 90% of their needs, currently unmet  MONITOR:   Diet advancement, PO intake, Labs, Weight trends, I & O's  ASSESSMENT:   71 y.o. Female with septic shock and acute hypoxic respiratory failure. Septic source is likely pneumonia. Patient now developing delirium and mild hypercarbia given recent ABG. Lactic acidosis is improving from procalcitonin remains significantly elevated.   Pt s/p procedures 1/22: VIDEO BRONCHOSCOPY WITH INSERTION OF INTERBRONCHIAL VALVE (IBV) x 3  Pt extubated this AM. TF (Vital High Protein formula) currently stopped. CORTRAK small bore feeding tube remains in place. ? plan for swallow evaluation per Speech Path. CBG's 116-133-100.  Diet Order:  Diet NPO time specified  Skin:  Wound (see comment) (skin tear to R buttocks)  Last BM:  1/30  Height:   Ht Readings from Last 1 Encounters:  11/14/16 '5\' 5"'$  (1.651 m)   Weight:   Wt Readings from Last 1 Encounters:  11/14/16 162 lb 7.7 oz (73.7 kg)   BMI:  Body mass index is 27.04 kg/m.  Re-estimated Nutritional Needs:   Kcal:  6606-3016  Protein:  105-115 gm  Fluid:  1.7-1.9 L  EDUCATION NEEDS:   No education needs identified at this time  Arthur Holms, RD, LDN Pager #: [phone] After-Hours Pager #: [phone]

## 2016-11-15 ENCOUNTER — Inpatient Hospital Stay (HOSPITAL_COMMUNITY): Payer: Medicare Other

## 2016-11-15 LAB — CBC
HCT: 26.9 % — ABNORMAL LOW (ref 36.0–46.0)
Hemoglobin: 9 g/dL — ABNORMAL LOW (ref 12.0–15.0)
MCH: 30.3 pg (ref 26.0–34.0)
MCHC: 33.5 g/dL (ref 30.0–36.0)
MCV: 90.6 fL (ref 78.0–100.0)
Platelets: 218 10*3/uL (ref 150–400)
RBC: 2.97 MIL/uL — ABNORMAL LOW (ref 3.87–5.11)
RDW: 16.2 % — ABNORMAL HIGH (ref 11.5–15.5)
WBC: 13.6 10*3/uL — ABNORMAL HIGH (ref 4.0–10.5)

## 2016-11-15 LAB — BASIC METABOLIC PANEL
ANION GAP: 8 (ref 5–15)
Anion gap: 7 (ref 5–15)
BUN: 51 mg/dL — ABNORMAL HIGH (ref 6–20)
BUN: 48 mg/dL — ABNORMAL HIGH (ref 6–20)
CALCIUM: 8.5 mg/dL — AB (ref 8.9–10.3)
CO2: 26 mmol/L (ref 22–32)
CO2: 26 mmol/L (ref 22–32)
Calcium: 8.4 mg/dL — ABNORMAL LOW (ref 8.9–10.3)
Chloride: 115 mmol/L — ABNORMAL HIGH (ref 101–111)
Chloride: 117 mmol/L — ABNORMAL HIGH (ref 101–111)
Creatinine, Ser: 1.55 mg/dL — ABNORMAL HIGH (ref 0.44–1.00)
Creatinine, Ser: 1.4 mg/dL — ABNORMAL HIGH (ref 0.44–1.00)
GFR calc Af Amer: 38 mL/min — ABNORMAL LOW (ref >60)
GFR calc Af Amer: 43 mL/min — ABNORMAL LOW (ref >60)
GFR calc non Af Amer: 33 mL/min — ABNORMAL LOW (ref >60)
GFR, EST NON AFRICAN AMERICAN: 37 mL/min — AB (ref >60)
GLUCOSE: 122 mg/dL — AB (ref 65–99)
Glucose, Bld: 107 mg/dL — ABNORMAL HIGH (ref 65–99)
POTASSIUM: 3.4 mmol/L — AB (ref 3.5–5.1)
Potassium: 3.6 mmol/L (ref 3.5–5.1)
Sodium: 148 mmol/L — ABNORMAL HIGH (ref 135–145)
Sodium: 151 mmol/L — ABNORMAL HIGH (ref 135–145)

## 2016-11-15 LAB — GLUCOSE, CAPILLARY
Glucose-Capillary: 107 mg/dL — ABNORMAL HIGH (ref 65–99)
Glucose-Capillary: 113 mg/dL — ABNORMAL HIGH (ref 65–99)
Glucose-Capillary: 126 mg/dL — ABNORMAL HIGH (ref 65–99)
Glucose-Capillary: 128 mg/dL — ABNORMAL HIGH (ref 65–99)
Glucose-Capillary: 88 mg/dL (ref 65–99)
Glucose-Capillary: 90 mg/dL (ref 65–99)

## 2016-11-15 LAB — HEPARIN LEVEL (UNFRACTIONATED): Heparin Unfractionated: 0.32 IU/mL (ref 0.30–0.70)

## 2016-11-15 LAB — APTT: aPTT: 61 seconds — ABNORMAL HIGH (ref 24–36)

## 2016-11-15 MED ORDER — HEPARIN (PORCINE) IN NACL 100-0.45 UNIT/ML-% IJ SOLN
800.0000 [IU]/h | INTRAMUSCULAR | Status: DC
  Administered 2016-11-15 – 2016-11-16 (×2): 1150 [IU]/h via INTRAVENOUS
  Administered 2016-11-17 – 2016-11-18 (×2): 1100 [IU]/h via INTRAVENOUS
  Filled 2016-11-15 (×4): qty 250

## 2016-11-15 MED ORDER — CHLORHEXIDINE GLUCONATE 0.12 % MT SOLN
15.0000 mL | Freq: Two times a day (BID) | OROMUCOSAL | Status: DC
  Administered 2016-11-15 – 2016-12-04 (×36): 15 mL via OROMUCOSAL
  Filled 2016-11-15 (×24): qty 15

## 2016-11-15 MED ORDER — ORAL CARE MOUTH RINSE
15.0000 mL | Freq: Two times a day (BID) | OROMUCOSAL | Status: DC
Start: 2016-11-15 — End: 2016-12-05
  Administered 2016-11-16 – 2016-12-04 (×34): 15 mL via OROMUCOSAL

## 2016-11-15 MED ORDER — VITAL HIGH PROTEIN PO LIQD
1000.0000 mL | ORAL | Status: DC
  Administered 2016-11-15 – 2016-11-16 (×2): 1000 mL

## 2016-11-15 MED ORDER — FENTANYL CITRATE (PF) 100 MCG/2ML IJ SOLN
12.5000 ug | INTRAMUSCULAR | Status: DC | PRN
  Administered 2016-11-15: 12.5 ug via INTRAVENOUS
  Administered 2016-11-16: 25 ug via INTRAVENOUS
  Administered 2016-11-16: 50 ug via INTRAVENOUS
  Filled 2016-11-15 (×3): qty 2

## 2016-11-15 MED ORDER — DEXTROSE 5 % IV SOLN
INTRAVENOUS | Status: DC
  Administered 2016-11-15 – 2016-11-18 (×4): via INTRAVENOUS
  Administered 2016-11-19: 50 mL via INTRAVENOUS
  Administered 2016-11-20 – 2016-11-27 (×4): via INTRAVENOUS

## 2016-11-15 NOTE — Progress Notes (Signed)
      [address]       [place] [address]             [phone]      PM Rounds  Sleeping. Family in room  BP 129/69   Pulse 91   Temp 97.6 F (36.4 C) (Oral)   Resp (!) 24   Ht '5\' 5"'$  (1.651 m)   Wt 154 lb 15.7 oz (70.3 kg)   SpO2 98%   BMI 25.79 kg/m    Intake/Output Summary (Last 24 hours) at 11/15/16 1720 Last data filed at 11/15/16 1600  Gross per 24 hour  Intake          1375.43 ml  Output             2725 ml  Net         -1349.57 ml   Maintaining SR Respiratory status is stable at present  [place]. Roxan Hockey, MD [facility] [phone]

## 2016-11-15 NOTE — Progress Notes (Signed)
Patient ID: [name], female   DOB: [birth year], 71 y.o.   MRN: [id number] TCTS DAILY ICU PROGRESS NOTE                   [address]            ,[place] [address]          [phone]   10 Days Post-Op Procedure(s) (LRB): VIDEO BRONCHOSCOPY WITH INSERTION OF INTERBRONCHIAL VALVE (IBV) times three (N/A)  Total Length of Stay:  LOS: 17 days   Subjective: Tolerated extubation   Objective: Vital signs in last 24 hours: Temp:  [97.2 F (36.2 C)-98.7 F (37.1 C)] 97.2 F (36.2 C) (02/01 0824) Pulse Rate:  [81-149] 91 (02/01 1000) Cardiac Rhythm: Normal sinus rhythm (02/01 0800) Resp:  [18-31] 31 (02/01 1000) BP: (126-154)/(66-90) 154/77 (02/01 1000) SpO2:  [93 %-100 %] 99 % (02/01 1000) Arterial Line BP: (170)/(69) 170/69 (01/31 1200) Weight:  [154 lb 15.7 oz (70.3 kg)] 154 lb 15.7 oz (70.3 kg) (02/01 0500)  Filed Weights   11/13/16 0600 11/14/16 0600 11/15/16 0500  Weight: 160 lb 7.9 oz (72.8 kg) 162 lb 7.7 oz (73.7 kg) 154 lb 15.7 oz (70.3 kg)    Weight change: -7 lb 7.9 oz (-3.4 kg)   Hemodynamic parameters for last 24 hours: CVP:  [4 mmHg-8 mmHg] 4 mmHg  Intake/Output from previous day: 01/31 0701 - 02/01 0700 In: 1504.9 [I.V.:819.9; NG/GT:310; IV Piggyback:315] Out: 3060 [Urine:1880; Stool:400; Chest Tube:780]  Intake/Output this shift: Total I/O In: 177.1 [I.V.:147.1; NG/GT:30] Out: 530 [Urine:350; Chest Tube:180]  Current Meds: Scheduled Meds: . chlorhexidine gluconate (MEDLINE KIT)  15 mL Mouth Rinse BID  . feeding supplement (PRO-STAT SUGAR FREE 64)  30 mL Per Tube BID  . Zayed Griffie's butt cream   Topical BID  . insulin aspart  0-15 Units Subcutaneous Q4H  . ipratropium  0.5 mg Nebulization Q6H  . levalbuterol  0.63 mg Nebulization Q6H  . mouth rinse  15 mL Mouth Rinse BID  . pantoprazole sodium  40 mg Per Tube Q1200  . sodium chloride flush  10-40 mL Intracatheter Q12H   Continuous Infusions: . sodium chloride 10 mL/hr at 11/15/16  1000  . amiodarone 30 mg/hr (11/15/16 1015)  . dextrose 30 mL/hr at 11/15/16 1000  . feeding supplement (VITAL HIGH PROTEIN) 1,000 mL (11/15/16 1024)  . fentaNYL infusion INTRAVENOUS Stopped (11/14/16 0930)  . heparin 1,150 Units/hr (11/15/16 1000)   PRN Meds:.Place/Maintain arterial line **AND** sodium chloride, acetaminophen, fentaNYL (SUBLIMAZE) injection, levalbuterol, potassium chloride (KCL MULTIRUN) 30 mEq in 265 mL IVPB, sodium chloride flush  General appearance: alert, cooperative, appears older than stated age and no distress Neurologic: intact Heart: regular rate and rhythm, S1, S2 normal, no murmur, click, rub or gallop Lungs: diminished breath sounds bibasilar Abdomen: soft, non-tender; bowel sounds normal; no masses,  no organomegaly Extremities: extremities normal, atraumatic, no cyanosis or edema and Homans sign is negative, no sign of DVT Wound: Still with air leak, but appears less  Lab Results: CBC: Recent Labs  11/14/16 0345 11/15/16 0424  WBC 12.2* 13.6*  HGB 8.3* 9.0*  HCT 24.0* 26.9*  PLT 109* 218   BMET:  Recent Labs  11/14/16 0345 11/15/16 0424  NA 142 148*  K 3.2* 3.6  CL 109 115*  CO2 24 26  GLUCOSE 124* 107*  BUN 54* 51*  CREATININE 1.70* 1.55*  CALCIUM 7.9* 8.4*    CMET: Lab Results  Component Value Date  WBC 13.6 (H) 11/15/2016   HGB 9.0 (L) 11/15/2016   HCT 26.9 (L) 11/15/2016   PLT 218 11/15/2016   GLUCOSE 107 (H) 11/15/2016   TRIG 137 11/12/2016   ALT 25 11/11/2016   AST 32 11/11/2016   NA 148 (H) 11/15/2016   K 3.6 11/15/2016   CL 115 (H) 11/15/2016   CREATININE 1.55 (H) 11/15/2016   BUN 51 (H) 11/15/2016   CO2 26 11/15/2016   INR 1.04 11/11/2016      PT/INR: No results for input(s): LABPROT, INR in the last 72 hours. Radiology: Dg Chest Port 1 View  Result Date: 11/15/2016 CLINICAL DATA:  Extubated yesterday, history of lung carcinoma, chest tube remains portable chest x-ray of 11/14/2016 EXAM: PORTABLE CHEST 1 VIEW  COMPARISON:  None. FINDINGS: The right lung is poorly aerated with diminished aeration compared yesterday's film. The endotracheal tube is been removed. A right chest tube remains and there is a small right apical pneumothorax which is perhaps slightly decreased in size. Opacity remains throughout the right lung and there may be a small left pleural effusion present. Heart size is stable. Feeding tube remains. Right central venous line tip overlies the lower SVC. IMPRESSION: 1. Endotracheal tube removed with diminished aeration of the lungs particularly on the right. 2. Persistent opacity throughout the right lung with volume loss. Possible left effusion. 3. Right chest tube with some decrease in volume of the right apical pneumothorax. Electronically Signed   By: Ivar Drape M.D.   On: 11/15/2016 07:59     Assessment/Plan: S/P Procedure(s) (LRB): VIDEO BRONCHOSCOPY WITH INSERTION OF INTERBRONCHIAL VALVE (IBV) times three (N/A) Mobilize Diuresis  Slowly recovering from sepsis, renal function better  Continue aggressive nutrition     [name] 11/15/2016 11:26 AM

## 2016-11-15 NOTE — Plan of Care (Signed)
Problem: SLP Dysphagia Goals Goal: Patient will demonstrate readiness for PO's Patient will demonstrate readiness for PO's and/or instrumental swallow study as evidenced by:  Ability to consume ice chip trials without overt indication of aspiration with mod assist

## 2016-11-15 NOTE — Progress Notes (Signed)
PULMONARY / CRITICAL CARE MEDICINE   Name: [name] MRN: [id number] DOB: 10-09-1946    ADMISSION DATE:  10/29/2016 CONSULTATION DATE:  11/07/2016  REFERRING MD:  Lanelle Bal, M.D. / CVTS  CHIEF COMPLAINT:  Acute Hypoxic Respiratory Failure  BRIEF SUMMARY:  71 y.o. 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 Initially was ambulatory in the ICU and out of bed without difficulty. She later had worsening hypoxia and respiratory status prompting initiation of BiPAP support and PCCM consultation.  Course complicated by shock thought to be sepsis from PNA but no positive cultures, AF with RVR, bilateral LE DVT.  Intubated for hypoxic respiratory failure 1/26.    SUBJECTIVE:   Extubated, not tachy, BP wnl, sats 100%  VITAL SIGNS: BP 131/75 (BP Location: Left Arm)   Pulse 86   Temp 97.2 F (36.2 C) (Oral)   Resp (!) 23   Ht '5\' 5"'$  (1.651 m)   Wt 70.3 kg (154 lb 15.7 oz)   SpO2 97%   BMI 25.79 kg/m   HEMODYNAMICS: CVP:  [6 mmHg-8 mmHg] 7 mmHg  VENTILATOR SETTINGS:    INTAKE / OUTPUT: I/O last 3 completed shifts: In: 3120.8 [I.V.:1310.8; Other:60; NG/GT:1070; IV Piggyback:680] Out: 4250 [Urine:2860; Stool:500; Chest Tube:890]  PHYSICAL EXAMINATION:  General:  No distress HEENT: jvd Neuro: awake, can phonate, nonfocal, diffuse weak   CV: s1s2 rrr, no m/r/g PULM: reduced rt base , leak RX:VQMG, non-tender, bsx4 wnl Extremities: edema Skin: lip chap    LABS: CBC Latest Ref Rng & Units 11/15/2016 11/14/2016 11/13/2016  WBC 4.0 - 10.5 K/uL 13.6(H) 12.2(H) 14.2(H)  Hemoglobin 12.0 - 15.0 g/dL 9.0(L) 8.3(L) 9.1(L)  Hematocrit 36.0 - 46.0 % 26.9(L) 24.0(L) 26.0(L)  Platelets 150 - 400 K/uL 218 109(L) 59(L)   BMP Latest Ref Rng & Units 11/15/2016 11/14/2016 11/13/2016  Glucose 65 - 99 mg/dL 107(H) 124(H) 124(H)  BUN 6  - 20 mg/dL 51(H) 54(H) 53(H)  Creatinine 0.44 - 1.00 mg/dL 1.55(H) 1.70(H) 1.84(H)  Sodium 135 - 145 mmol/L 148(H) 142 140  Potassium 3.5 - 5.1 mmol/L 3.6 3.2(L) 2.9(L)  Chloride 101 - 111 mmol/L 115(H) 109 107  CO2 22 - 32 mmol/L '26 24 24  '$ Calcium 8.9 - 10.3 mg/dL 8.4(L) 7.9(L) 7.7(L)    STUDIES:  PFT 08/24/16 >> FVC 2.54 L (89%) FEV1 1.22 L (57%) FEV1/FVC 0.48 FEF 25-75 0.44 L (24%) negative bronchial dilator response TLC 5.19 L (120%) RV 155% ERV 76% DLCO uncorrected 52% PORT CXR 1/24 >>  Previously reviewed by me. No new left opacity. No appreciated pleural effusion. Right basilar opacity relatively unchanged but need be slightly worsened. Right-sided chest tubes remain in place.  TTE 1/24 >>  Poor quality study. Unable to assess RV. No pericardial effusion. LV with mild LVH but normal in size. EF 60-65%. Unable to assess wall motion. Grade 1 diastolic dysfunction. BILAT LE VENOUS DUPLEX 1/24 >>  There is evidence of acute deep vein thrombosis involving the peroneal veins of the right and left lower extremities. Renal US 1/27 >> no hydronephrosis or stones  MICROBIOLOGY: MRSA PCR 1/11:  Negative Right Bronchial Washing 1/22:  Negative Blood Ctx x2 1/24 >>  Urine Ctx 1/24 >> negative  ANTIBIOTICS: Vancomycin 1/24 >>1/30 Zosyn 1/24 >>1/31  SIGNIFICANT EVENTS: 1/15 - Admit w/ RUL Lobectomy 1/22 - Bronchoscopy with 3 right-sided endobronchial valves for persistent air leak  1/26 - Hypoxic respiratory failure, intubated  1/28 - Heparin d/c'd due to thrombocytopenia, HIT panel pending.  Changed to argatroban. 1/30 still with air leak, weaning well 1/31 extubated, to chair succeassful  LINES/TUBES: Right Chest Tubes 1/15 >> R IJ CLV 1/24 >> Foley 1/24 >> PIV  ASSESSMENT / PLAN:  71 y.o. female admitted for right upper lobectomy completed on 1/15. Patient had subsequent persistent air leak. Patient's shock and acute hypoxic respiratory failure likely due to evolving sepsis from PNA  (NOS). Source likely from her lung given recent surgery. No other source identified at this time. No indication for stress dose steroids. Continue amiodarone and systemic anticoagulation > changed to argatroban 1/28 due to thrombocytopenia.    PULMONARY A: Acute Hypoxic Respiratory Failure secondary to PNA RUK NSCLCA - s/p RULobectomy  Persistent Right Pneumothorax & Air Leak Secondary to lobectomy - Chest tubes in place & endobronchial valves placed 1/22 Moderate to Severe COPD P:   Chest tube suction pcxr with smaller ptx, leak remains IS No role NIMV Neg balance goals  CARDIOVASCULAR A:  Septic Shock likely secondary to PNA Afib with RVR , converted sinus 1/31 Bilateral Calf Vein/Peroneal DVT H/O HTN, HLD  P:  amio , now SR, keep load Tele Neg balance goal Dc cvp  RENAL Lab Results  Component Value Date   CREATININE 1.55 (H) 11/15/2016   CREATININE 1.70 (H) 11/14/2016   CREATININE 1.84 (H) 11/13/2016   CREATININE 0.9 09/11/2016    Recent Labs Lab 11/13/16 0500 11/14/16 0345 11/15/16 0424  K 2.9* 3.2* 3.6   A:   Acute Renal Failure Hypernatremia autodiuresis wit hhypokalemia P:  Add d5w low rate Allow auto diuresis cvp accuracy? k supp  GASTROINTESTINAL A:   Nausea and Vomiting - resolved R/o dysphagia P:   Continue TF to 20-25 , assess tolerance prior to ramp up PPO  SLP  HEMATOLOGIC A:   Anticoagulation secondary to DVT  Thrombocytopenia 31->59--> 218 (sepsis) R/O HIT -negative P: Consider transition to heparin sens elisa for NPV great, with neg elisa, no SRA needed  INFECTIOUS A:   Septic Shock secondary to PNA   Leucocytosis resolving P:   Will give zosyn 8 days empiric- dc allowed Follow fever curve  ENDOCRINE CBG (last 3)   Recent Labs  11/14/16 2354 11/15/16 0407 11/15/16 0821  GLUCAP 100* 90 88    A:   Hyperglycemia controlled  P:   SSI   NEUROLOGIC A:   Acute Metabolic Encephalopathy  Post Operative Pain    P:   RASS Goal: 0 met fent prn PT Chair Mobilize crucial  FAMILY  - Updates:  Multiple family members updated by me son etc 1/31, 2/1  Lavon Paganini. Titus Mould, MD, [place] Pgr: [facility] 11/15/2016 8:38 AM

## 2016-11-15 NOTE — Progress Notes (Signed)
[place] for Heparin Indication: DVT and atrial fibrillation  Allergies  Allergen Reactions  . Nickel Rash    Patient Measurements: Height: '5\' 5"'$  (165.1 cm) Weight: 154 lb 15.7 oz (70.3 kg) IBW/kg (Calculated) : 57 Heparin Dosing Weight: 68kg  Vital Signs: Temp: 97.6 F (36.4 C) (02/01 1615) Temp Source: Oral (02/01 1615) BP: 142/68 (02/01 1800) Pulse Rate: 88 (02/01 1800)  Labs:  Recent Labs  11/13/16 0500 11/14/16 0345 11/15/16 0424 11/15/16 1800  HGB 9.1* 8.3* 9.0*  --   HCT 26.0* 24.0* 26.9*  --   PLT 59* 109* 218  --   APTT 72* 64* 61*  --   HEPARINUNFRC  --   --   --  0.32  CREATININE 1.84* 1.70* 1.55*  --     Estimated Creatinine Clearance: 32.7 mL/min (by C-G formula based on SCr of 1.55 mg/dL (H)).   Assessment: 14 yoF with dopplers positive for bilateral peroneal DVT also with AFib. Argatroban stopped earlier today and transitioned back to heparin as HIT negative. Initial heparin level is therapeutic at 0.32.   Goal of Therapy:  Heparin level 0.3- 0.7 Monitor platelets by anticoagulation protocol: Yes   Plan:  1. Continue heparin infusion at 1150 units/hr  2. Confirmatory heparin level with am labs 3. Daily heparin level and CBC   Vincenza Hews, PharmD, BCPS 11/15/2016, 7:16 PM

## 2016-11-15 NOTE — Evaluation (Signed)
Clinical/Bedside Swallow Evaluation Patient Details  Name: [name] MRN: [id number] Date of Birth: Dec 07, 1945  Today's Date: 11/15/2016 Time: SLP Start Time (ACUTE ONLY): 0900 SLP Stop Time (ACUTE ONLY): 0919 SLP Time Calculation (min) (ACUTE ONLY): 19 min  Past Medical History:  Past Medical History:  Diagnosis Date  . Arthritis   . Asthma    asthmatic bronchitis  . COPD (chronic obstructive pulmonary disease) ([place])   . Dyspnea   . History of kidney stones   . Hyperlipidemia   . Hypertension   . Pneumonia   . PONV (postoperative nausea and vomiting)    none with her hip surgery  . Restless leg syndrome   . 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 ABDOMINAL HYSTERECTOMY    . BONE DENSIDEXAY  06/02/2013  . CARDIAC CATHETERIZATION    . COLONOSCOPY  08/05/2015  . CYST EXCISION     from back  . CYSTO    . DENTAL EXAMINATION UNDER ANESTHESIA    . DIAGNOSTIC MAMMOGRAM  07/28/2016  . EYE SURGERY Left    cataract surgery with lens implant  . LYMPH NODE DISSECTION Right 10/29/2016   Procedure: LYMPH NODE DISSECTION;  Surgeon: [name], MD;  Location: [place];  Service: Thoracic;  Laterality: Right;  . TONSILLECTOMY    . TOTAL HIP ARTHROPLASTY  10/31/2012   Procedure: TOTAL HIP ARTHROPLASTY ANTERIOR APPROACH;  Surgeon: Mcarthur Rossetti, MD;  Location: [facility];  Service: Orthopedics;  Laterality: Right;  Right Total Hip Replacement  . VIDEO ASSISTED THORACOSCOPY (VATS)/WEDGE RESECTION Right 10/29/2016   Procedure: RIGHT  VIDEO ASSISTED THORACOSCOPY /RIGHT UPPER LOBE LUNG RESECTION and Right Thoracotomy;  Surgeon: [name], MD;  Location: [place];  Service: Thoracic;  Laterality: Right;  [name] N/A 10/29/2016   Procedure: VIDEO BRONCHOSCOPY;  Surgeon: [name], MD;  Location: [place];  Service: Thoracic;  Laterality: N/A;  . VIDEO BRONCHOSCOPY  WITH ENDOBRONCHIAL NAVIGATION N/A 08/29/2016   Procedure: VIDEO BRONCHOSCOPY WITH ENDOBRONCHIAL NAVIGATION;  Surgeon: Collene Gobble, MD;  Location: [facility];  Service: Thoracic;  Laterality: N/A;  . VIDEO BRONCHOSCOPY WITH INSERTION OF INTERBRONCHIAL VALVE (IBV) N/A 11/05/2016   Procedure: VIDEO BRONCHOSCOPY WITH INSERTION OF INTERBRONCHIAL VALVE (IBV) times three;  Surgeon: [name], MD;  Location: [facility];  Service: Thoracic;  Laterality: N/A;   HPI:  71 y.o. 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 Initially was ambulatory in the ICU and out of bed without difficulty. She later had worsening hypoxia and respiratory status prompting initiation of BiPAP support and PCCM consultation.  Course complicated by shock thought to be sepsis from PNA but no positive cultures, AF with RVR, bilateral LE DVT.  Intubated for hypoxic respiratory failure 1/26.     Assessment / Plan / Recommendation Clinical Impression  Patient presents with indication of a moderate-severe oropharyngeal dysphagia characterized by weakness due to deconditioning and decreased glottal closure s/p prolonged intubation. Patient with overt indication of aspiration with pos provided. Educated family regarding results, plan, and prognosis for ability to resume pos which is good with time off vent and improved strength. Recommend NPO except 3-5 ice chips after thorough mouth care to facilitate use of swallowing musculature and hydration of oral mucosa. SLP will  f/u closely for diagnostic treatment.     Aspiration Risk  Severe aspiration risk;Moderate aspiration risk    Diet Recommendation NPO;Ice chips PRN after oral care (3-5 ice chips after mouth care)   Medication Administration: Via alternative means    Other  Recommendations Oral Care Recommendations: Oral care prior  to ice chip/H20;Oral care QID   Follow up Recommendations Inpatient Rehab      Frequency and Duration min 3x week  2 weeks       Prognosis Prognosis for Safe Diet Advancement: Good Barriers to Reach Goals: Severity of deficits      Swallow Study   General HPI: 71 y.o. 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 Initially was ambulatory in the ICU and out of bed without difficulty. She later had worsening hypoxia and respiratory status prompting initiation of BiPAP support and PCCM consultation.  Course complicated by shock thought to be sepsis from PNA but no positive cultures, AF with RVR, bilateral LE DVT.  Intubated for hypoxic respiratory failure 1/26.   Type of Study: Bedside Swallow Evaluation Previous Swallow Assessment: none noted Diet Prior to this Study: NPO Temperature Spikes Noted: No Respiratory Status: Nasal cannula History of Recent Intubation: Yes Length of Intubations (days): 6 days Date extubated: 11/14/16 Behavior/Cognition: Cooperative;Pleasant mood;Lethargic/Drowsy Oral Cavity Assessment: Dry;Lesions;Dried secretions (bloody secretions) Oral Care Completed by SLP: Yes Oral Cavity - Dentition: Adequate natural dentition Vision: Functional for self-feeding Self-Feeding Abilities: Needs assist Patient Positioning: Upright in bed Baseline Vocal Quality: Low vocal intensity;Hoarse;Breathy Volitional Cough: Weak Volitional Swallow: Able to elicit    Oral/Motor/Sensory Function Overall Oral Motor/Sensory Function: Generalized oral weakness   Ice Chips Ice chips: Impaired Presentation: Spoon Pharyngeal Phase Impairments: Decreased hyoid-laryngeal movement;Suspected delayed Swallow;Cough - Immediate;Cough - Delayed   Thin Liquid Thin Liquid: Not tested    Nectar Thick Nectar Thick Liquid: Not tested    Honey Thick Honey Thick Liquid: Not tested   Puree Puree: Not tested   Solid   GO   Solid: Not tested       Hannah Rainwater MA, CCC-SLP [phone]  [name] [name] 11/15/2016,9:34 AM

## 2016-11-16 ENCOUNTER — Inpatient Hospital Stay (HOSPITAL_COMMUNITY): Payer: Medicare Other

## 2016-11-16 DIAGNOSIS — E870 Hyperosmolality and hypernatremia: Secondary | ICD-10-CM

## 2016-11-16 LAB — GLUCOSE, CAPILLARY
Glucose-Capillary: 102 mg/dL — ABNORMAL HIGH (ref 65–99)
Glucose-Capillary: 125 mg/dL — ABNORMAL HIGH (ref 65–99)
Glucose-Capillary: 127 mg/dL — ABNORMAL HIGH (ref 65–99)
Glucose-Capillary: 123 mg/dL — ABNORMAL HIGH (ref 65–99)
Glucose-Capillary: 135 mg/dL — ABNORMAL HIGH (ref 65–99)

## 2016-11-16 LAB — BASIC METABOLIC PANEL
ANION GAP: 8 (ref 5–15)
BUN: 46 mg/dL — ABNORMAL HIGH (ref 6–20)
CO2: 28 mmol/L (ref 22–32)
Calcium: 8.5 mg/dL — ABNORMAL LOW (ref 8.9–10.3)
Chloride: 117 mmol/L — ABNORMAL HIGH (ref 101–111)
Creatinine, Ser: 1.22 mg/dL — ABNORMAL HIGH (ref 0.44–1.00)
GFR, EST AFRICAN AMERICAN: 50 mL/min — AB (ref >60)
GFR, EST NON AFRICAN AMERICAN: 43 mL/min — AB (ref >60)
Glucose, Bld: 123 mg/dL — ABNORMAL HIGH (ref 65–99)
POTASSIUM: 3.3 mmol/L — AB (ref 3.5–5.1)
SODIUM: 153 mmol/L — AB (ref 135–145)

## 2016-11-16 LAB — CBC WITH DIFFERENTIAL/PLATELET
BASOS ABS: 0 10*3/uL (ref 0.0–0.1)
Basophils Relative: 0 %
EOS PCT: 0 %
Eosinophils Absolute: 0.1 10*3/uL (ref 0.0–0.7)
HCT: 27.7 % — ABNORMAL LOW (ref 36.0–46.0)
Hemoglobin: 9.1 g/dL — ABNORMAL LOW (ref 12.0–15.0)
LYMPHS PCT: 23 %
Lymphs Abs: 3.3 10*3/uL (ref 0.7–4.0)
MCH: 30.4 pg (ref 26.0–34.0)
MCHC: 32.9 g/dL (ref 30.0–36.0)
MCV: 92.6 fL (ref 78.0–100.0)
Monocytes Absolute: 1.1 10*3/uL — ABNORMAL HIGH (ref 0.1–1.0)
Monocytes Relative: 8 %
NEUTROS PCT: 69 %
Neutro Abs: 10 10*3/uL — ABNORMAL HIGH (ref 1.7–7.7)
PLATELETS: 293 10*3/uL (ref 150–400)
RBC: 2.99 MIL/uL — AB (ref 3.87–5.11)
RDW: 16.9 % — ABNORMAL HIGH (ref 11.5–15.5)
WBC: 14.5 10*3/uL — AB (ref 4.0–10.5)

## 2016-11-16 LAB — HEPARIN LEVEL (UNFRACTIONATED): HEPARIN UNFRACTIONATED: 0.69 [IU]/mL (ref 0.30–0.70)

## 2016-11-16 MED ORDER — AMIODARONE HCL IN DEXTROSE 360-4.14 MG/200ML-% IV SOLN: 60.0000 mg/h | INTRAVENOUS | Status: DC

## 2016-11-16 MED ORDER — AMIODARONE HCL IN DEXTROSE 360-4.14 MG/200ML-% IV SOLN: 30.0000 mg/h | INTRAVENOUS | Status: DC

## 2016-11-16 MED ORDER — ACETYLCYSTEINE 20 % IN SOLN
2.0000 mL | Freq: Four times a day (QID) | RESPIRATORY_TRACT | Status: DC
  Administered 2016-11-16 – 2016-11-20 (×18): 2 mL via RESPIRATORY_TRACT
  Filled 2016-11-16 (×18): qty 4

## 2016-11-16 MED ORDER — AMIODARONE HCL 200 MG PO TABS
200.0000 mg | ORAL_TABLET | Freq: Two times a day (BID) | ORAL | Status: DC
  Administered 2016-11-16: 200 mg
  Filled 2016-11-16: qty 1

## 2016-11-16 MED ORDER — FREE WATER
200.0000 mL | Freq: Four times a day (QID) | Status: DC
  Administered 2016-11-16 – 2016-12-05 (×69): 200 mL

## 2016-11-16 MED ORDER — AMIODARONE HCL IN DEXTROSE 360-4.14 MG/200ML-% IV SOLN
30.0000 mg/h | INTRAVENOUS | Status: DC
  Administered 2016-11-16 – 2016-11-20 (×8): 30 mg/h via INTRAVENOUS
  Filled 2016-11-16 (×8): qty 200

## 2016-11-16 NOTE — Progress Notes (Signed)
PULMONARY / CRITICAL CARE MEDICINE   Name: [name] MRN: [id number] DOB: 03-Apr-1946    ADMISSION DATE:  10/29/2016 CONSULTATION DATE:  11/07/2016  REFERRING MD:  Lanelle Bal, M.D. / CVTS  CHIEF COMPLAINT:  Acute Hypoxic Respiratory Failure  BRIEF SUMMARY:  71 y.o. 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 Initially was ambulatory in the ICU and out of bed without difficulty. She later had worsening hypoxia and  Intubated for hypoxic respiratory failure 0/99  Course complicated by shock thought to be sepsis from PNA but no positive cultures, AF with RVR, bilateral LE DVT. .    SUBJECTIVE:   Remains Extubated, appears weak, hoarse voice Denies pain Afebrile Good UO  VITAL SIGNS: BP (!) 142/73   Pulse 87   Temp 97.5 F (36.4 C) (Oral)   Resp (!) 31   Ht '5\' 5"'$  (1.651 m)   Wt 152 lb 8.9 oz (69.2 kg)   SpO2 98%   BMI 25.39 kg/m   HEMODYNAMICS:    VENTILATOR SETTINGS:    INTAKE / OUTPUT: I/O last 3 completed shifts: In: 2540.6 [I.V.:1856.1; NG/GT:684.5] Out: 4175 [Urine:2735; Stool:350; Chest Tube:1090]  PHYSICAL EXAMINATION:  General:  Frail appearing, no resp distress HEENT: jvd Neuro: awake, can phonate, nonfocal, diffuse weak   CV: s1s2 rrr, no m/r/g PULM: reduced rt base , leak IP:JASN, non-tender, bsx4 wnl Extremities: edema Skin: lip chap    LABS: CBC Latest Ref Rng & Units 11/16/2016 11/15/2016 11/14/2016  WBC 4.0 - 10.5 K/uL 14.5(H) 13.6(H) 12.2(H)  Hemoglobin 12.0 - 15.0 g/dL 9.1(L) 9.0(L) 8.3(L)  Hematocrit 36.0 - 46.0 % 27.7(L) 26.9(L) 24.0(L)  Platelets 150 - 400 K/uL 293 218 109(L)   BMP Latest Ref Rng & Units 11/16/2016 11/15/2016 11/15/2016  Glucose 65 - 99 mg/dL 123(H) 122(H) 107(H)  BUN 6 - 20 mg/dL 46(H) 48(H) 51(H)  Creatinine 0.44 - 1.00 mg/dL 1.22(H) 1.40(H) 1.55(H)  Sodium 135 -  145 mmol/L 153(H) 151(H) 148(H)  Potassium 3.5 - 5.1 mmol/L 3.3(L) 3.4(L) 3.6  Chloride 101 - 111 mmol/L 117(H) 117(H) 115(H)  CO2 22 - 32 mmol/L '28 26 26  '$ Calcium 8.9 - 10.3 mg/dL 8.5(L) 8.5(L) 8.4(L)    STUDIES:  PFT 08/24/16 >> FVC 2.54 L (89%) FEV1 1.22 L (57%) FEV1/FVC 0.48 FEF 25-75 0.44 L (24%) negative bronchial dilator response TLC 5.19 L (120%) RV 155% ERV 76% DLCO uncorrected 52% TTE 1/24 >>  Poor quality study. Unable to assess RV. No pericardial effusion. LV with mild LVH but normal in size. EF 60-65%. Unable to assess wall motion. Grade 1 diastolic dysfunction. BILAT LE VENOUS DUPLEX 1/24 >>  There is evidence of acute deep vein thrombosis involving the peroneal veins of the right and left lower extremities. Renal US 1/27 >> no hydronephrosis or stones  MICROBIOLOGY: MRSA PCR 1/11:  Negative Right Bronchial Washing 1/22:  Negative Blood Ctx x2 1/24 >> ng Urine Ctx 1/24 >> negative  ANTIBIOTICS: Vancomycin 1/24 >>1/30 Zosyn 1/24 >>1/31  SIGNIFICANT EVENTS: 1/15 - Admit w/ RUL Lobectomy 1/22 - Bronchoscopy with 3 right-sided endobronchial valves for persistent air leak 1/26 - Hypoxic respiratory failure, intubated  1/28 - Heparin d/c'd due to thrombocytopenia, HIT panel pending.  Changed to argatroban. 1/30 still with air leak, weaning well 1/31 extubated  LINES/TUBES: Right Chest Tubes 1/15 >> R IJ CLV 1/24 >> Foley 1/24 >>  ASSESSMENT / PLAN:  71 y.o. female admitted for right upper lobectomy completed on 1/15. Patient had subsequent persistent air leak. Patient's shock and acute hypoxic respiratory failure likely due to evolving sepsis from PNA (NOS). Source likely from her lung given recent surgery. Course complicated by BPF, AF-RVR & DVT  PULMONARY A: Acute Hypoxic Respiratory Failure secondary to PNA RUK NSCLCA - s/p RULobectomy  Persistent Right Pneumothorax & Air Leak Secondary to lobectomy - Chest tubes in place & endobronchial valves placed  1/22 Moderate to Severe COPD P:   Chest tube suction, leak persistent Neg balance goals Ct nebs, add mucomyst, NTS prn  CARDIOVASCULAR A:  Septic Shock likely secondary to PNA Afib with RVR , converted sinus 1/31 Bilateral Calf Vein/Peroneal DVT H/O HTN, HLD  P:  amio gtt   RENAL Lab Results  Component Value Date   CREATININE 1.22 (H) 11/16/2016   CREATININE 1.40 (H) 11/15/2016   CREATININE 1.55 (H) 11/15/2016   CREATININE 0.9 09/11/2016    Recent Labs Lab 11/15/16 0424 11/15/16 1837 11/16/16 0450  K 3.6 3.4* 3.3*   A:   Acute Renal Failure Hypernatremia -rising hypokalemia P:  increase d5w , add free water k supp  GASTROINTESTINAL A:   Nausea and Vomiting - resolved R/o dysphagia P:   Continue TF 30/h  , assess tolerance prior to ramp up PPO  SLP   HEMATOLOGIC A:   Anticoagulation secondary to DVT  Thrombocytopenia 31-> resolved  changed to argatroban 1/28 due to thrombocytopenia, HIT neg ELISA.  P: Back on  heparin   INFECTIOUS A:   Septic Shock secondary to PNA   Leucocytosis resolving P:   Completed abx, observe off   ENDOCRINE CBG (last 3)   Recent Labs  11/16/16 0002 11/16/16 0407 11/16/16 0901  GLUCAP 128* 102* 125*    A:   Hyperglycemia controlled  P:   SSI   NEUROLOGIC A:   Acute Metabolic Encephalopathy -resolved Post Operative Pain   P:   fent prn PT Mobilize  FAMILY  - Updates:  Multiple family members updated  2/1  Summary - CXR appears worse 2/2 ? Atelectasis, fluid , worse BPF - ct conservative measures  Kara Mead MD. FCCP. [facility] Pager [phone] If no response call [phone]   11/16/2016

## 2016-11-16 NOTE — Progress Notes (Signed)
NTS patient per MD request and obtained very minimal secretions. Some blood noted due to nasal trauma from inserting the catheter. Patient instructed to continue to deep breath and cough up secretions. RN aware.

## 2016-11-16 NOTE — Progress Notes (Signed)
CT surgery p.m. Rounds  Resting quietly in bed Follow-up p.m. chest x-ray shows some improvement in right lung aeration after adding Mucomyst and increasing pulmonary toilet measures No air leak from chest tubes On IV amiodarone for atrial fib

## 2016-11-16 NOTE — Progress Notes (Signed)
[place] for Heparin Indication: DVT and atrial fibrillation  Allergies  Allergen Reactions  . Nickel Rash    Patient Measurements: Height: '5\' 5"'$  (165.1 cm) Weight: 152 lb 8.9 oz (69.2 kg) IBW/kg (Calculated) : 57 Heparin Dosing Weight: 68kg  Vital Signs: Temp: 98.5 F (36.9 C) (02/02 0400) Temp Source: Oral (02/02 0400) BP: 135/73 (02/02 0800) Pulse Rate: 85 (02/02 0800)  Labs:  Recent Labs  11/14/16 0345 11/15/16 0424 11/15/16 1800 11/15/16 1837 11/16/16 0450  HGB 8.3* 9.0*  --   --  9.1*  HCT 24.0* 26.9*  --   --  27.7*  PLT 109* 218  --   --  293  APTT 64* 61*  --   --   --   HEPARINUNFRC  --   --  0.32  --  0.69  CREATININE 1.70* 1.55*  --  1.40* 1.22*    Estimated Creatinine Clearance: 41.3 mL/min (by C-G formula based on SCr of 1.22 mg/dL (H)).   Assessment: 27 yoF with dopplers positive for bilateral peroneal DVT also with AFib. Argatroban stopped 2/1 and transitioned back to heparin as HIT negative. Heparin level is therapeutic at 0.69. CBC stable. No bleeding reported.   Goal of Therapy:  Heparin level 0.3- 0.7 Monitor platelets by anticoagulation protocol: Yes   Plan:  1. decrease heparin infusion to 1100 units/hr  2. Daily heparin level and CBC   Eudelia Bunch, Pharm.D. [phone] 11/16/2016 9:00 AM

## 2016-11-16 NOTE — Progress Notes (Signed)
Speech Language Pathology Treatment: Dysphagia  Patient Details Name: [name] MRN: [id number] DOB: [birth year] Today's Date: 11/16/2016 Time: 4098-1191 SLP Time Calculation (min) (ACUTE ONLY): 16 min  Assessment / Plan / Recommendation Clinical Impression  Diagnostic treatment complete. Per RN, CXR worse today. Per RN report, patient received large quantities of ice chips last evening. Recommendations in place are for 3-5 ice chips after thorough mouth care. Question aspiration related changes to CXR given risk and events of last night. In general however, patient clinically appearing stronger today with improved vocal quality, remains hoarse and intermittently breathy but with increased audible phonation. Able to consume po trials with increased evidence of airway protection today but with continued sluggish appearing hyo-laryngeal movement and delayed oral transit of bolus. Thin liquids continue to elicit a cough response indicative of aspiration. Patient eager to consume pos. Recommend FEES this pm to determine extent of dysphagia and ability to consume pos.    HPI HPI: 71 y.o. 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 Initially was ambulatory in the ICU and out of bed without difficulty. She later had worsening hypoxia and respiratory status prompting initiation of BiPAP support and PCCM consultation.  Course complicated by shock thought to be sepsis from PNA but no positive cultures, AF with RVR, bilateral LE DVT.  Intubated for hypoxic respiratory failure 1/26.        SLP Plan   (FEES)     Recommendations  Diet recommendations: NPO Medication Administration: Via alternative means                Oral Care Recommendations: Oral care QID;Oral care prior to ice chip/H20 Follow up Recommendations: Inpatient Rehab Plan:   (FEES)       [place] MA, CCC-SLP [phone]    [name] 11/16/2016, 11:19 AM

## 2016-11-16 NOTE — Progress Notes (Addendum)
      [address]       [place] [address]             [phone]      11 Days Post-Op Procedure(s) (LRB): VIDEO BRONCHOSCOPY WITH INSERTION OF INTERBRONCHIAL VALVE (IBV) times three (N/A) Subjective: States that she just wants to eat.   Objective: Vital signs in last 24 hours: Temp:  [97.2 F (36.2 C)-98.5 F (36.9 C)] 98.5 F (36.9 C) (02/02 0400) Pulse Rate:  [83-124] 83 (02/02 0700) Cardiac Rhythm: Normal sinus rhythm (02/01 1942) Resp:  [22-32] 23 (02/02 0700) BP: (119-154)/(59-79) 125/72 (02/02 0700) SpO2:  [95 %-100 %] 100 % (02/02 0700) Weight:  [69.2 kg (152 lb 8.9 oz)] 69.2 kg (152 lb 8.9 oz) (02/02 0600)  Hemodynamic parameters for last 24 hours: CVP:  [4 mmHg-7 mmHg] 4 mmHg  Intake/Output from previous day: 02/01 0701 - 02/02 0700 In: 1346.4 [I.V.:981.9; NG/GT:364.5] Out: 2695 [Urine:1755; Stool:300; Chest Tube:640] Intake/Output this shift: No intake/output data recorded.  General appearance: alert, cooperative and no distress Heart: regular rate and rhythm, S1, S2 normal, no murmur, click, rub or gallop Lungs: clear to auscultation bilaterally Abdomen: soft, non-tender; bowel sounds normal; no masses,  no organomegaly Extremities: 1+ nonpitting edema in hands and feet Wound: clean and dry  Lab Results:  Recent Labs  11/15/16 0424 11/16/16 0450  WBC 13.6* 14.5*  HGB 9.0* 9.1*  HCT 26.9* 27.7*  PLT 218 293   BMET:  Recent Labs  11/15/16 1837 11/16/16 0450  NA 151* 153*  K 3.4* 3.3*  CL 117* 117*  CO2 26 28  GLUCOSE 122* 123*  BUN 48* 46*  CREATININE 1.40* 1.22*  CALCIUM 8.5* 8.5*    PT/INR: No results for input(s): LABPROT, INR in the last 72 hours. ABG    Component Value Date/Time   PHART 7.376 11/12/2016 1519   HCO3 26.4 11/12/2016 1519   TCO2 28 11/12/2016 1519   ACIDBASEDEF 5.0 (H) 11/11/2016 0423   O2SAT 99.0 11/12/2016 1519   CBG (last 3)   Recent Labs  11/15/16 1937 11/16/16 0002 11/16/16 0407    GLUCAP 113* 128* 102*    Assessment/Plan: S/P Procedure(s) (LRB): VIDEO BRONCHOSCOPY WITH INSERTION OF INTERBRONCHIAL VALVE (IBV) times three (N/A)  1. CV- NSR in the 80s. Good blood pressure. Continues on Amio gtt, heparin gtt 2. Pulm- tolerated extubation. 5L continuous flow. Continue nebs. Chest tube with 640cc/24 hours, keep. 3. Renal-creatinine 1.22 today. Replace potassium per protocol. Good urine output, weight continues to trend down.  4. H and H stable. Platelets good.  5. Blood glucose level well controlled on current regimen. Continue aggressive nutrition and trial with ice chips 6. WBC stable. No fevers overnight.   Plan: Continues to progress. Mobilize. Keep chest tube until output decreases. Renal function continues to improve. Encourage incentive spirometry.     LOS: 18 days    [name] 11/16/2016  Decreased air leak this am, but increased obacity of right lung, improved this afternoon on follow up film  with mucomyst and suctioning  I have seen and examined [name] and agree with the above assessment  and plan.  [name] MD Beeper [phone] Office [phone] 11/16/2016 3:35 PM

## 2016-11-16 NOTE — Progress Notes (Signed)
Nutrition Follow Up  DOCUMENTATION CODES:   Not applicable  INTERVENTION:    Recommend Vital AF 1.2 at 60 ml/hr (1440 ml per day) to provide 1728 kcals, 108 gm protein, 1168 ml of free water daily  NUTRITION DIAGNOSIS:   Inadequate oral intake related to inability to eat as evidenced by NPO status, ongoing  GOAL:   Patient will meet greater than or equal to 90% of their needs, currently unmet  MONITOR:   Diet advancement, PO intake, Labs, Weight trends, I & O's  ASSESSMENT:   71 y.o. Female with septic shock and acute hypoxic respiratory failure. Septic source is likely pneumonia. Patient now developing delirium and mild hypercarbia given recent ABG. Lactic acidosis is improving from procalcitonin remains significantly elevated.   Pt s/p procedures 1/22: VIDEO BRONCHOSCOPY WITH INSERTION OF INTERBRONCHIAL VALVE (IBV) x 3  S/P swallow evaluation with SLP this morning, unable to safely advance diet. Plans to hold off on FEES until 2/5 due to ongoing tenuous respiratory status. Patient is receiving Vital High Protein via Cortrak tube at 20 ml/h (480 kcal, 42 gm protein, 401 ml free water daily). Sodium 153, potassium 3.3 CBG's: 128-102-125-127 Medications reviewed.  Diet Order:  Diet NPO time specified  Skin:  Wound (see comment) (skin tear to R buttocks)  Last BM:  2/2 (flexiseal)  Height:   Ht Readings from Last 1 Encounters:  11/14/16 '5\' 5"'$  (1.651 m)   Weight:   Wt Readings from Last 1 Encounters:  11/16/16 152 lb 8.9 oz (69.2 kg)   BMI:  Body mass index is 25.39 kg/m.  Estimated Nutritional Needs:   Kcal:  7782-4235  Protein:  105-115 gm  Fluid:  1.7-1.9 L  EDUCATION NEEDS:   No education needs identified at this time  Molli Barrows, [place], [place], [place] Pager [phone] After Hours Pager [phone]

## 2016-11-16 NOTE — Progress Notes (Signed)
Discussed possible FEES this pm with MD who has concerns regarding advancing diet given tenuous respiratory status, recent CXR. MD recommends holding FEES until Monday 2/5. Given above, would also recommend holding ice chips at this time. Recommend strict NPO with thorough mouth care. Informed patient. Will f/u 2/3. If patient is demonstrating improvements, possibly could consider MBS or FEES prior to Monday??  Gabriel Rainwater [place], Fernando Salinas [phone]

## 2016-11-16 NOTE — Progress Notes (Signed)
  PT Cancellation Note  Patient Details Name: [name] MRN: [id number] DOB: 06-Jun-1946   Cancelled Treatment:    Reason Eval/Treat Not Completed: Medical issues which prohibited therapy (RN deferred secondary to pt lung collapse. Will check back next date for medical stability)   [name] Erica Osuna 11/16/2016, 8:30 AM [name], [name]

## 2016-11-17 ENCOUNTER — Inpatient Hospital Stay (HOSPITAL_COMMUNITY): Payer: Medicare Other

## 2016-11-17 LAB — CBC
HCT: 29.3 % — ABNORMAL LOW (ref 36.0–46.0)
Hemoglobin: 9.4 g/dL — ABNORMAL LOW (ref 12.0–15.0)
MCH: 30.3 pg (ref 26.0–34.0)
MCHC: 32.1 g/dL (ref 30.0–36.0)
MCV: 94.5 fL (ref 78.0–100.0)
Platelets: 368 10*3/uL (ref 150–400)
RBC: 3.1 MIL/uL — ABNORMAL LOW (ref 3.87–5.11)
RDW: 17.3 % — ABNORMAL HIGH (ref 11.5–15.5)
WBC: 16.2 10*3/uL — ABNORMAL HIGH (ref 4.0–10.5)

## 2016-11-17 LAB — GLUCOSE, CAPILLARY
Glucose-Capillary: 109 mg/dL — ABNORMAL HIGH (ref 65–99)
Glucose-Capillary: 111 mg/dL — ABNORMAL HIGH (ref 65–99)
Glucose-Capillary: 113 mg/dL — ABNORMAL HIGH (ref 65–99)
Glucose-Capillary: 117 mg/dL — ABNORMAL HIGH (ref 65–99)
Glucose-Capillary: 122 mg/dL — ABNORMAL HIGH (ref 65–99)
Glucose-Capillary: 126 mg/dL — ABNORMAL HIGH (ref 65–99)
Glucose-Capillary: 130 mg/dL — ABNORMAL HIGH (ref 65–99)

## 2016-11-17 LAB — BASIC METABOLIC PANEL
Anion gap: 6 (ref 5–15)
BUN: 36 mg/dL — AB (ref 6–20)
CO2: 30 mmol/L (ref 22–32)
CREATININE: 1.08 mg/dL — AB (ref 0.44–1.00)
Calcium: 8.4 mg/dL — ABNORMAL LOW (ref 8.9–10.3)
Chloride: 116 mmol/L — ABNORMAL HIGH (ref 101–111)
GFR, EST AFRICAN AMERICAN: 58 mL/min — AB (ref >60)
GFR, EST NON AFRICAN AMERICAN: 50 mL/min — AB (ref >60)
Glucose, Bld: 115 mg/dL — ABNORMAL HIGH (ref 65–99)
POTASSIUM: 3.4 mmol/L — AB (ref 3.5–5.1)
SODIUM: 152 mmol/L — AB (ref 135–145)

## 2016-11-17 LAB — HEPARIN LEVEL (UNFRACTIONATED): HEPARIN UNFRACTIONATED: 0.6 [IU]/mL (ref 0.30–0.70)

## 2016-11-17 MED ORDER — FLUCONAZOLE IN SODIUM CHLORIDE 100-0.9 MG/50ML-% IV SOLN
100.0000 mg | INTRAVENOUS | Status: AC
  Administered 2016-11-17 – 2016-11-20 (×4): 100 mg via INTRAVENOUS
  Filled 2016-11-17 (×6): qty 50

## 2016-11-17 MED ORDER — SODIUM CHLORIDE 0.9 % IV SOLN
30.0000 meq | Freq: Once | INTRAVENOUS | Status: AC
  Administered 2016-11-17: 30 meq via INTRAVENOUS
  Filled 2016-11-17: qty 15

## 2016-11-17 MED ORDER — MAGIC MOUTHWASH
5.0000 mL | Freq: Three times a day (TID) | ORAL | Status: DC
  Administered 2016-11-17 – 2016-12-05 (×54): 5 mL via ORAL
  Filled 2016-11-17 (×57): qty 5

## 2016-11-17 MED ORDER — VITAL HIGH PROTEIN PO LIQD
1000.0000 mL | ORAL | Status: DC
  Administered 2016-11-17 – 2016-11-19 (×3): 1000 mL

## 2016-11-17 MED ORDER — CHLORHEXIDINE GLUCONATE CLOTH 2 % EX PADS
6.0000 | MEDICATED_PAD | Freq: Every day | CUTANEOUS | Status: DC
  Administered 2016-11-17 – 2016-12-05 (×13): 6 via TOPICAL

## 2016-11-17 MED ORDER — FENTANYL CITRATE (PF) 100 MCG/2ML IJ SOLN
12.5000 ug | Freq: Four times a day (QID) | INTRAMUSCULAR | Status: DC | PRN
  Administered 2016-11-17 – 2016-11-22 (×7): 50 ug via INTRAVENOUS
  Administered 2016-11-23: 25 ug via INTRAVENOUS
  Administered 2016-11-23 – 2016-11-30 (×13): 50 ug via INTRAVENOUS
  Filled 2016-11-17 (×22): qty 2

## 2016-11-17 MED ORDER — VITAL HIGH PROTEIN PO LIQD
1000.0000 mL | ORAL | Status: DC
Start: 2016-11-17 — End: 2016-11-17
  Administered 2016-11-17: 1000 mL

## 2016-11-17 MED ORDER — DEXTROSE 5 % IV SOLN
1.0000 g | Freq: Two times a day (BID) | INTRAVENOUS | Status: DC
  Administered 2016-11-17 – 2016-11-19 (×6): 1 g via INTRAVENOUS
  Filled 2016-11-17 (×9): qty 1

## 2016-11-17 NOTE — Progress Notes (Signed)
PULMONARY / CRITICAL CARE MEDICINE   Name: [name] MRN: [id number] DOB: 30-Jun-1946    ADMISSION DATE:  10/29/2016 CONSULTATION DATE:  11/07/2016  REFERRING MD:  Lanelle Bal, M.D. / CVTS  CHIEF COMPLAINT:  Acute Hypoxic Respiratory Failure  BRIEF SUMMARY:  71 y.o. 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 Initially was ambulatory in the ICU and out of bed without difficulty. She later had worsening hypoxia and  Intubated for hypoxic respiratory failure 8/09  Course complicated by shock thought to be sepsis from PNA but no positive cultures, AF with RVR, bilateral LE DVT. .    SUBJECTIVE:    Remains Extubated, appears weak, hoarse voice Tolerating TF.  On [place].    VITAL SIGNS: BP 121/68   Pulse 85   Temp 98.6 F (37 C) (Oral)   Resp 18   Ht '5\' 5"'$  (1.651 m)   Wt 70.6 kg (155 lb 10.3 oz)   SpO2 100%   BMI 25.90 kg/m   HEMODYNAMICS:    VENTILATOR SETTINGS:    INTAKE / OUTPUT: I/O last 3 completed shifts: In: 4598.9 [I.V.:2378.9; NG/GT:1690; IV XIPJASNKN:397] Out: 6734 [Urine:2555; Stool:1200; Chest Tube:1440]  PHYSICAL EXAMINATION:  General:  Frail appearing, no resp distress HEENT: jvd Neuro: awake, can phonate, nonfocal, diffuse weak   CV: s1s2 rrr, no m/r/g PULM: reduced rt base , leak LP:FXTK, non-tender, bsx4 wnl Extremities: edema Skin: lip chap    LABS: CBC Latest Ref Rng & Units 11/17/2016 11/16/2016 11/15/2016  WBC 4.0 - 10.5 K/uL 16.2(H) 14.5(H) 13.6(H)  Hemoglobin 12.0 - 15.0 g/dL 9.4(L) 9.1(L) 9.0(L)  Hematocrit 36.0 - 46.0 % 29.3(L) 27.7(L) 26.9(L)  Platelets 150 - 400 K/uL 368 293 218   BMP Latest Ref Rng & Units 11/17/2016 11/16/2016 11/15/2016  Glucose 65 - 99 mg/dL 115(H) 123(H) 122(H)  BUN 6 - 20 mg/dL 36(H) 46(H) 48(H)  Creatinine 0.44 - 1.00 mg/dL 1.08(H) 1.22(H) 1.40(H)  Sodium  135 - 145 mmol/L 152(H) 153(H) 151(H)  Potassium 3.5 - 5.1 mmol/L 3.4(L) 3.3(L) 3.4(L)  Chloride 101 - 111 mmol/L 116(H) 117(H) 117(H)  CO2 22 - 32 mmol/L '30 28 26  '$ Calcium 8.9 - 10.3 mg/dL 8.4(L) 8.5(L) 8.5(L)    STUDIES:  PFT 08/24/16 >> FVC 2.54 L (89%) FEV1 1.22 L (57%) FEV1/FVC 0.48 FEF 25-75 0.44 L (24%) negative bronchial dilator response TLC 5.19 L (120%) RV 155% ERV 76% DLCO uncorrected 52% TTE 1/24 >>  Poor quality study. Unable to assess RV. No pericardial effusion. LV with mild LVH but normal in size. EF 60-65%. Unable to assess wall motion. Grade 1 diastolic dysfunction. BILAT LE VENOUS DUPLEX 1/24 >>  There is evidence of acute deep vein thrombosis involving the peroneal veins of the right and left lower extremities. Renal US 1/27 >> no hydronephrosis or stones  MICROBIOLOGY: MRSA PCR 1/11:  Negative Right Bronchial Washing 1/22:  Negative Blood Ctx x2 1/24 >> ng Urine Ctx 1/24 >> negative  ANTIBIOTICS: Vancomycin 1/24 >>1/30 Zosyn 1/24 >>1/31  SIGNIFICANT EVENTS: 1/15 - Admit w/ RUL Lobectomy 1/22 - Bronchoscopy with 3 right-sided endobronchial valves for persistent air leak 1/26 - Hypoxic respiratory failure, intubated  1/28 - Heparin d/c'd due to thrombocytopenia, HIT panel pending.  Changed to argatroban. 1/30 still with air leak, weaning well 1/31 extubated  LINES/TUBES: Right Chest Tubes 1/15 >> R IJ CLV 1/24 >> Foley 1/24 >>  ASSESSMENT / PLAN:  71 y.o. female admitted for right upper lobectomy completed on 1/15. Patient had subsequent persistent air leak. Patient's shock and acute hypoxic respiratory failure likely due to evolving sepsis from PNA (NOS). Source likely from her lung given recent surgery. Course complicated by BPF, AF-RVR & DVT  PULMONARY A: S/P Acute Hypoxic Respiratory Failure secondary to PNA. Now on [place] RUL NSCLCA - s/p RULobectomy  Persistent Right Pneumothorax & Air Leak Secondary to lobectomy - Chest tubes in place & Endobronchial  valves placed 1/22 Moderate to Severe COPD P:   Chest tube suction, leak persistent Neg balance goals Cont mucomyst, atrovent, xopenex q6.  Protecting airway. On [place].  Keep O2 sats > 88% CXR is stable (likely combination of surgery + atelectasis).  Pt is not in distress.   CARDIOVASCULAR A:  S/P Septic Shock likely secondary to PNA Afib with RVR , converted sinus 1/31 Bilateral Calf Vein/Peroneal DVT H/O HTN, HLD  P:  Cont Amiodarone drip   RENAL Lab Results  Component Value Date   CREATININE 1.08 (H) 11/17/2016   CREATININE 1.22 (H) 11/16/2016   CREATININE 1.40 (H) 11/15/2016   CREATININE 0.9 09/11/2016    Recent Labs Lab 11/15/16 1837 11/16/16 0450 11/17/16 0358  K 3.4* 3.3* 3.4*   A:   Acute Renal Failure Hypernatremia  Hypokalemia P:  Cont free water Cont TF D5 water was ordered but I do not see it in orders.    GASTROINTESTINAL A:   Nausea and Vomiting - resolved R/o dysphagia P:   Continue TF 30/h, advance as tolerated.  PPO  SLP   HEMATOLOGIC A:   Anticoagulation secondary to DVT  Thrombocytopenia 31-> resolved Changed to argatroban 1/28 due to thrombocytopenia, HIT neg ELISA so back on heparin drip P: Cont  Heparin drip   INFECTIOUS A:   S/P Septic Shock secondary to PNA   Leucocytosis P:   Completed abx, observe off for now.  Pt still has ceftazidime ordered. Not sure if we can d/c per primary service.   ENDOCRINE CBG (last 3)   Recent Labs  11/17/16 0511 11/17/16 0743 11/17/16 1109  GLUCAP 109* 113* 111*    A:   Hyperglycemia controlled  P:   SSI   NEUROLOGIC A:   Acute Metabolic Encephalopathy -resolved Post Operative Pain   P:   Fent prn PT Mobilize  FAMILY  - Updates:  Multiple family members updated  2/1. No family on 2/3.     I spent  30   minutes of Critical Care time with this patient today.   Monica Becton, MD 11/17/2016, 2:22 PM [facility] Pager [phone]  [phone] After 3 pm or if no answer, call [phone]

## 2016-11-17 NOTE — Progress Notes (Signed)
SLP Cancellation Note  Patient Details Name: [name] MRN: [id number] DOB: Nov 26, 1945   Cancelled treatment:       Reason Eval/Treat Not Completed: Patient not medically ready. Spoke with MD Ivin Poot, MD) who stated that patient was still not ready for an MBSS or FEES and to wait until Monday to consider an objective swallow evaluation. SLP will check on patient's status on Monday to determine readiness for MBSS or FEES.   Sonia Baller, MA, CCC-SLP Speech Therapy [facility]

## 2016-11-17 NOTE — Progress Notes (Signed)
CT surgery p.m. Rounds  Up in chair breathing comfortably No rattling airway noises, right breath sounds improved Tracheal aspirate with heavy PMNs, some Candida--continue Fortaz and add IV Diflucan Airleak is minimal

## 2016-11-17 NOTE — Progress Notes (Signed)
12 Days Post-Op Procedure(s) (LRB): VIDEO BRONCHOSCOPY WITH INSERTION OF INTERBRONCHIAL VALVE (IBV) times three (N/A) Subjective: Cont R atelectasis from airway secretions Weak, wet cough, rising WBC NTS'ed 30 cc purulent secretions - C&S sent, Fortaz started TF inctreased to 30 Will get OOB to chair Objective: Vital signs in last 24 hours: Temp:  [97.6 F (36.4 C)-98.6 F (37 C)] 98.6 F (37 C) (02/03 0700) Pulse Rate:  [82-117] 85 (02/03 0700) Cardiac Rhythm: Normal sinus rhythm (02/03 0400) Resp:  [18-35] 18 (02/03 0700) BP: (92-135)/(56-81) 121/68 (02/03 0700) SpO2:  [97 %-100 %] 100 % (02/03 0821) Weight:  [155 lb 10.3 oz (70.6 kg)] 155 lb 10.3 oz (70.6 kg) (02/03 0500)  Hemodynamic parameters for last 24 hours:  stable BP nsr  Intake/Output from previous day: 02/02 0701 - 02/03 0700 In: 3532.3 [I.V.:1652.3; NG/GT:1350; IV Piggyback:530] Out: 3875 [Urine:1655; Stool:900; Chest Tube:1130] Intake/Output this shift: No intake/output data recorded. Weak  Dim BS on R   Lab Results:  Recent Labs  11/16/16 0450 11/17/16 0358  WBC 14.5* 16.2*  HGB 9.1* 9.4*  HCT 27.7* 29.3*  PLT 293 368   BMET:  Recent Labs  11/16/16 0450 11/17/16 0358  NA 153* 152*  K 3.3* 3.4*  CL 117* 116*  CO2 28 30  GLUCOSE 123* 115*  BUN 46* 36*  CREATININE 1.22* 1.08*  CALCIUM 8.5* 8.4*    PT/INR: No results for input(s): LABPROT, INR in the last 72 hours. ABG    Component Value Date/Time   PHART 7.376 11/12/2016 1519   HCO3 26.4 11/12/2016 1519   TCO2 28 11/12/2016 1519   ACIDBASEDEF 5.0 (H) 11/11/2016 0423   O2SAT 99.0 11/12/2016 1519   CBG (last 3)   Recent Labs  11/17/16 0027 11/17/16 0511 11/17/16 0743  GLUCAP 122* 109* 113*    Assessment/Plan: S/P Procedure(s) (LRB): VIDEO BRONCHOSCOPY WITH INSERTION OF INTERBRONCHIAL VALVE (IBV) times three (N/A) Check sputum culture, cover  With Fortaz NTS prn   LOS: 19 days    Tharon Aquas Trigt III 11/17/2016

## 2016-11-17 NOTE — Progress Notes (Signed)
PHARMACY NOTE:  ANTIMICROBIAL RENAL DOSAGE ADJUSTMENT  Current antimicrobial regimen includes a mismatch between antimicrobial dosage and estimated renal function.  As per policy approved by the Pharmacy & Therapeutics and Medical Executive Committees, the antimicrobial dosage will be adjusted accordingly.  Current antimicrobial dosage:  Ceftazidime 1g IVq8h   Indication: Sepsis/Persistent Rt. Pneumothorax  Renal Function: 47 mL/min    Antimicrobial dosage has been changed to:  Ceftazidime 1g IVq12h  Additional comments: Pharmacy will monitor renal function peripherally and adjust accordingly.    Thank you for allowing pharmacy to be a part of this patient's care.  Dierdre Harness, [place], PharmD Clinical Pharmacy Resident [phone] (Pager) 11/17/2016 10:12 AM

## 2016-11-17 NOTE — Progress Notes (Signed)
[place] drainage system exchanged. Pt tolerated procedure well. Will continue to monitor.  Sherlie Ban, RN

## 2016-11-17 NOTE — Evaluation (Signed)
Physical Therapy Evaluation Patient Details Name: [name] MRN: [id number] DOB: 26-Sep-1946 Today's Date: 11/17/2016   History of Present Illness  71 y.o. 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 Initially was ambulatory in the ICU and out of bed without difficulty. She later had worsening hypoxia and  Intubated for hypoxic respiratory failure 0/93  Course complicated by shock thought to be sepsis from PNA but no positive cultures, AF with RVR, bilateral LE DVT.  Patient extubated 11/14/16.     Clinical Impression  Patient presents with problems listed below.  Will benefit from acute PT to maximize functional mobility prior to discharge.  Patient was independent pta.  She has had a long and complicated hospitalization resulting in significant debilitation.  Patient with decreased strength and balance, impacting mobility and gait.  Recommend Inpatient Rehab consult with goal to return patient to optimal functional level to return home with husband.    Follow Up Recommendations CIR;Supervision for mobility/OOB    Equipment Recommendations  None recommended by PT    Recommendations for Other Services Rehab consult;OT consult     Precautions / Restrictions Precautions Precautions: Fall Precaution Comments: Chest tube Restrictions Weight Bearing Restrictions: No      Mobility  Bed Mobility               General bed mobility comments: Patient sitting in chair as PT entered room.  Transfers Overall transfer level: Needs assistance Equipment used: 2 person hand held assist Transfers: Sit to/from Stand Sit to Stand: Mod assist;+2 physical assistance         General transfer comment: Required mod assist to lean forward in chair.  Able to scoot to edge of chair with min assist.  Required +2 mod assist to power up  to standing.  Patient able to stand x 30 seconds.  LE's fatigued and patient returned to sitting in chair.  Patient declined further mobility, reporting "that wore me out".  Able to scoot hips back into chair with min assist.  Ambulation/Gait             General Gait Details: NT - fatigue  Stairs            Wheelchair Mobility    Modified Rankin (Stroke Patients Only)       Balance Overall balance assessment: Needs assistance Sitting-balance support: Single extremity supported;Feet supported Sitting balance-Leahy Scale: Poor     Standing balance support: Bilateral upper extremity supported Standing balance-Leahy Scale: Poor                               Pertinent Vitals/Pain Pain Assessment: No/denies pain    Home Living Family/patient expects to be discharged to:: Private residence Living Arrangements: Spouse/significant other Available Help at Discharge: Family;Available 24 hours/day Type of Home: House Home Access: Stairs to enter Entrance Stairs-Rails: Psychiatric nurse of Steps: 5 Home Layout: Multi-level;Bed/bath upstairs (Enter on second floor with bedroom on third floor) Home Equipment: Walker - 2 wheels;Cane - single point;Shower seat      Prior Function Level of Independence: Independent         Comments: Driving     Hand Dominance        Extremity/Trunk Assessment   Upper Extremity Assessment Upper Extremity Assessment: Generalized weakness    Lower Extremity Assessment Lower  Extremity Assessment: Generalized weakness    Cervical / Trunk Assessment Cervical / Trunk Assessment: Other exceptions Cervical / Trunk Exceptions: Incision Rt flank; chest tube  Communication   Communication: No difficulties  Cognition Arousal/Alertness: Awake/alert Behavior During Therapy: Anxious;Flat affect Overall Cognitive Status: Within Functional Limits for tasks assessed                 General Comments:  Answering questions appropriately.    General Comments      Exercises General Exercises - Lower Extremity Ankle Circles/Pumps: AROM;Both;10 reps;Seated   Assessment/Plan    PT Assessment Patient needs continued PT services  PT Problem List Decreased strength;Decreased activity tolerance;Decreased balance;Decreased mobility;Decreased knowledge of use of DME;Cardiopulmonary status limiting activity;Decreased skin integrity          PT Treatment Interventions DME instruction;Gait training;Stair training;Functional mobility training;Therapeutic activities;Therapeutic exercise;Balance training;Patient/family education    PT Goals (Current goals can be found in the Care Plan section)  Acute Rehab PT Goals Patient Stated Goal: None stated today PT Goal Formulation: With patient Time For Goal Achievement: 12/01/16 Potential to Achieve Goals: Good    Frequency Min 3X/week   Barriers to discharge        Co-evaluation               End of Session Equipment Utilized During Treatment: Oxygen Activity Tolerance: Patient limited by fatigue Patient left: in chair;with call bell/phone within reach           Time: 1424-1435 PT Time Calculation (min) (ACUTE ONLY): 11 min   Charges:   PT Evaluation $PT Eval High Complexity: 1 Procedure     PT G Codes:        Despina Pole Nov 19, 2016, 3:21 PM Carita Pian. Sanjuana Kava, [place] Pager [phone]

## 2016-11-18 ENCOUNTER — Inpatient Hospital Stay (HOSPITAL_COMMUNITY): Payer: Medicare Other

## 2016-11-18 LAB — BASIC METABOLIC PANEL
Anion gap: 8 (ref 5–15)
BUN: 33 mg/dL — ABNORMAL HIGH (ref 6–20)
CO2: 30 mmol/L (ref 22–32)
Calcium: 8.5 mg/dL — ABNORMAL LOW (ref 8.9–10.3)
Chloride: 113 mmol/L — ABNORMAL HIGH (ref 101–111)
Creatinine, Ser: 1.03 mg/dL — ABNORMAL HIGH (ref 0.44–1.00)
GFR calc Af Amer: >60 mL/min (ref >60)
GFR calc non Af Amer: 53 mL/min — ABNORMAL LOW (ref >60)
Glucose, Bld: 133 mg/dL — ABNORMAL HIGH (ref 65–99)
Potassium: 3.3 mmol/L — ABNORMAL LOW (ref 3.5–5.1)
Sodium: 151 mmol/L — ABNORMAL HIGH (ref 135–145)

## 2016-11-18 LAB — GLUCOSE, CAPILLARY
Glucose-Capillary: 102 mg/dL — ABNORMAL HIGH (ref 65–99)
Glucose-Capillary: 130 mg/dL — ABNORMAL HIGH (ref 65–99)
Glucose-Capillary: 132 mg/dL — ABNORMAL HIGH (ref 65–99)
Glucose-Capillary: 92 mg/dL (ref 65–99)
Glucose-Capillary: 102 mg/dL — ABNORMAL HIGH (ref 65–99)

## 2016-11-18 LAB — HEPARIN LEVEL (UNFRACTIONATED)
Heparin Unfractionated: 0.59 IU/mL (ref 0.30–0.70)
Heparin Unfractionated: 0.17 IU/mL — ABNORMAL LOW (ref 0.30–0.70)

## 2016-11-18 LAB — CBC
HCT: 28.5 % — ABNORMAL LOW (ref 36.0–46.0)
Hemoglobin: 9.1 g/dL — ABNORMAL LOW (ref 12.0–15.0)
MCH: 30.3 pg (ref 26.0–34.0)
MCHC: 31.9 g/dL (ref 30.0–36.0)
MCV: 95 fL (ref 78.0–100.0)
Platelets: 370 10*3/uL (ref 150–400)
RBC: 3 MIL/uL — ABNORMAL LOW (ref 3.87–5.11)
RDW: 17 % — ABNORMAL HIGH (ref 11.5–15.5)
WBC: 13.2 10*3/uL — ABNORMAL HIGH (ref 4.0–10.5)

## 2016-11-18 MED ORDER — POTASSIUM CHLORIDE 20 MEQ/15ML (10%) PO SOLN
20.0000 meq | ORAL | Status: AC
  Administered 2016-11-18 (×2): 20 meq
  Filled 2016-11-18 (×2): qty 15

## 2016-11-18 MED ORDER — HEPARIN (PORCINE) IN NACL 100-0.45 UNIT/ML-% IJ SOLN
1050.0000 [IU]/h | INTRAMUSCULAR | Status: DC
  Administered 2016-11-19 – 2016-11-24 (×6): 1000 [IU]/h via INTRAVENOUS
  Administered 2016-11-25 – 2016-11-29 (×5): 1050 [IU]/h via INTRAVENOUS
  Filled 2016-11-18 (×11): qty 250

## 2016-11-18 MED ORDER — PRO-STAT SUGAR FREE PO LIQD
40.0000 mL | Freq: Two times a day (BID) | ORAL | Status: DC
  Administered 2016-11-18 – 2016-11-20 (×2): 40 mL
  Filled 2016-11-18 (×2): qty 60

## 2016-11-18 NOTE — Progress Notes (Signed)
[place] for Heparin Indication: DVT and atrial fibrillation  Allergies  Allergen Reactions  . Nickel Rash    Patient Measurements: Height: '5\' 5"'$  (165.1 cm) Weight: 154 lb 8.7 oz (70.1 kg) IBW/kg (Calculated) : 57 Heparin Dosing Weight: 68kg  Vital Signs: Temp: 97.3 F (36.3 C) (02/04 0329) Temp Source: Oral (02/04 0329) BP: 108/62 (02/04 0700) Pulse Rate: 110 (02/04 0700)  Labs:  Recent Labs  11/16/16 0450 11/17/16 0358 11/18/16 0430  HGB 9.1* 9.4* 9.1*  HCT 27.7* 29.3* 28.5*  PLT 293 368 370  HEPARINUNFRC 0.69 0.60 0.59  CREATININE 1.22* 1.08* 1.03*    Estimated Creatinine Clearance: 49.2 mL/min (by C-G formula based on SCr of 1.03 mg/dL (H)).   Assessment: 15 yoF with dopplers positive for bilateral peroneal DVT also with AFib. Argatroban stopped 2/1 and transitioned back to heparin as HIT negative.  MD placed orders to stop heparin until 1000 this morning and restart at reduced rate of 800 units/hr due to bleeding around the airway. Current HL therapeutic. CBC stable.   Goal of Therapy:  Heparin level 0.3- 0.7 Monitor platelets by anticoagulation protocol: Yes   Plan:  1. Stop heparin until 1000 2. Restart heparin at 800 units/hr 3. Check 8 hour heparin level 4. Monitor signs and symptoms of bleeding 5. Daily HL and CBC  Dierdre Harness, BS, PharmD Clinical Pharmacy Resident [phone] (Pager) 11/18/2016 7:55 AM

## 2016-11-18 NOTE — Progress Notes (Signed)
[facility] ADULT ICU REPLACEMENT PROTOCOL FOR AM LAB REPLACEMENT ONLY  The patient does apply for the [facility] Adult ICU Electrolyte Replacment Protocol based on the criteria listed below:   1. Is GFR >/= 40 ml/min? Yes.    Patient's GFR today is 53 2. Is urine output >/= 0.5 ml/kg/hr for the last 6 hours? Yes.   Patient's UOP is 5.9 ml/kg/hr 3. Is BUN < 60 mg/dL? Yes.    Patient's BUN today is 33 4. Abnormal electrolyte(s): K=3.3 5. Ordered repletion with: K+ 1mq x 2 doses 6. If a panic level lab has been reported, has the CCM MD in charge been notified? Yes.  .   Physician:  Dr. DJamelle Haring JScotts HillDollar 11/18/2016 5:33 AM

## 2016-11-18 NOTE — Progress Notes (Signed)
PULMONARY / CRITICAL CARE MEDICINE   Name: [name] MRN: [id number] DOB: 1946/07/15    ADMISSION DATE:  10/29/2016 CONSULTATION DATE:  11/07/2016  REFERRING MD:  Lanelle Bal, M.D. / CVTS  CHIEF COMPLAINT:  Acute Hypoxic Respiratory Failure  BRIEF SUMMARY:  71 y.o. 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 Initially was ambulatory in the ICU and out of bed without difficulty. She later had worsening hypoxia and  Intubated for hypoxic respiratory failure 9/37  Course complicated by shock thought to be sepsis from PNA but no positive cultures, AF with RVR, bilateral LE DVT.   SUBJECTIVE:    Was placed on HFNC overnight.  On TF. Remains SOB.   VITAL SIGNS: BP 108/62   Pulse (!) 110   Temp 97.3 F (36.3 C) (Oral)   Resp (!) 33   Ht '5\' 5"'$  (1.651 m)   Wt 70.1 kg (154 lb 8.7 oz)   SpO2 94%   BMI 25.72 kg/m   HEMODYNAMICS:    VENTILATOR SETTINGS: FiO2 (%):  [45 %] 45 %  INTAKE / OUTPUT: I/O last 3 completed shifts: In: 4524.5 [I.V.:2139.5; NG/GT:1970; IV JIRCVELFY:101] Out: 7510 [Urine:2380; Emesis/NG output:60; Stool:900; Chest Tube:1134]  PHYSICAL EXAMINATION:  General:  Frail appearing, in mild  Distress. Fair cough HEENT: jvd Neuro: awake, can phonate, nonfocal, diffuse weak   CV: s1s2 rrr, no m/r/g PULM: reduced rt base , leak CH:ENID, non-tender, bsx4 wnl Extremities: edema Skin: lip chap    LABS: CBC Latest Ref Rng & Units 11/18/2016 11/17/2016 11/16/2016  WBC 4.0 - 10.5 K/uL 13.2(H) 16.2(H) 14.5(H)  Hemoglobin 12.0 - 15.0 g/dL 9.1(L) 9.4(L) 9.1(L)  Hematocrit 36.0 - 46.0 % 28.5(L) 29.3(L) 27.7(L)  Platelets 150 - 400 K/uL 370 368 293   BMP Latest Ref Rng & Units 11/18/2016 11/17/2016 11/16/2016  Glucose 65 - 99 mg/dL 133(H) 115(H) 123(H)  BUN 6 - 20 mg/dL 33(H) 36(H) 46(H)  Creatinine 0.44 - 1.00  mg/dL 1.03(H) 1.08(H) 1.22(H)  Sodium 135 - 145 mmol/L 151(H) 152(H) 153(H)  Potassium 3.5 - 5.1 mmol/L 3.3(L) 3.4(L) 3.3(L)  Chloride 101 - 111 mmol/L 113(H) 116(H) 117(H)  CO2 22 - 32 mmol/L '30 30 28  '$ Calcium 8.9 - 10.3 mg/dL 8.5(L) 8.4(L) 8.5(L)    STUDIES:  PFT 08/24/16 >> FVC 2.54 L (89%) FEV1 1.22 L (57%) FEV1/FVC 0.48 FEF 25-75 0.44 L (24%) negative bronchial dilator response TLC 5.19 L (120%) RV 155% ERV 76% DLCO uncorrected 52% TTE 1/24 >>  Poor quality study. Unable to assess RV. No pericardial effusion. LV with mild LVH but normal in size. EF 60-65%. Unable to assess wall motion. Grade 1 diastolic dysfunction. BILAT LE VENOUS DUPLEX 1/24 >>  There is evidence of acute deep vein thrombosis involving the peroneal veins of the right and left lower extremities. Renal US 1/27 >> no hydronephrosis or stones  MICROBIOLOGY: MRSA PCR 1/11:  Negative Right Bronchial Washing 1/22:  Negative Blood Ctx x2 1/24 >> ng Urine Ctx 1/24 >> negative  ANTIBIOTICS: Vancomycin 1/24 >>1/30 Zosyn 1/24 >>1/31 Ceftazidime 2/3  >   SIGNIFICANT EVENTS: 1/15 - Admit w/ RUL Lobectomy 1/22 - Bronchoscopy with 3 right-sided endobronchial valves for persistent air leak 1/26 - Hypoxic respiratory failure, intubated  1/28 - Heparin d/c'd due to thrombocytopenia, HIT panel pending.  Changed to argatroban. 1/30 still with air leak, weaning well 1/31 extubated  LINES/TUBES: Right Chest Tubes 1/15 >> R IJ CLV 1/24 >> Foley 1/24 >>   ASSESSMENT / PLAN:  71 y.o. female admitted for right upper lobectomy completed on 1/15. Patient had subsequent persistent air leak. Patient's shock and acute hypoxic respiratory failure likely due to evolving sepsis from PNA (NOS). Source likely from her lung given recent surgery. Course complicated by BPF, AF-RVR & DVT  PULMONARY A: S/P Acute Hypoxic Respiratory Failure secondary to PNA. Concern for new HCAP. Requiring more O2 last 12 hrs.  RUL NSCLCA - s/p RULobectomy   Persistent Right Pneumothorax & Air Leak Secondary to lobectomy - Chest tubes in place & Endobronchial valves placed 1/22 Moderate to Severe COPD P:   Chest tube suction, leak persistent Neg balance goals Cont mucomyst, atrovent, xopenex q6, CPT q4.  Protecting airway. On VM at 50%. Try HFNC. If resp status worsens, OK to try bipap per TCVS Keep O2 sats > 88% CXR is worse on 2/4 with more atelectasis, +/- HCAP Ceftazidime started on 2/3    CARDIOVASCULAR A:  S/P Septic Shock likely secondary to PNA Afib with RVR , converted sinus 1/31 Bilateral Calf Vein/Peroneal DVT H/O HTN, HLD  P:  Cont Amiodarone drip Consider diuresis   RENAL Lab Results  Component Value Date   CREATININE 1.03 (H) 11/18/2016   CREATININE 1.08 (H) 11/17/2016   CREATININE 1.22 (H) 11/16/2016   CREATININE 0.9 09/11/2016    Recent Labs Lab 11/16/16 0450 11/17/16 0358 11/18/16 0430  K 3.3* 3.4* 3.3*   A:   Acute Renal Failure Hypernatremia  Hypokalemia P:  Cont free water Cont TF   GASTROINTESTINAL A:   Nausea and Vomiting - resolved R/o dysphagia P:   Continue TF 40/h, advance as tolerated.  PPO  Cont free water  HEMATOLOGIC A:   Anticoagulation secondary to DVT  Thrombocytopenia 31-> resolved Changed to argatroban 1/28 due to thrombocytopenia, HIT neg ELISA so back on heparin drip P: Cont  Heparin drip   INFECTIOUS A:   S/P Septic Shock secondary to PNA   Leucocytosis P:   Started on ceftazidime on 2/3. R/O new HCAP.    ENDOCRINE CBG (last 3)   Recent Labs  11/18/16 0327 11/18/16 0848 11/18/16 1113  GLUCAP 102* 130* 132*    A:   Hyperglycemia controlled  P:   SSI   NEUROLOGIC A:   Acute Metabolic Encephalopathy -resolved Post Operative Pain   P:   Fent prn PT Mobilize  FAMILY  - Updates:  Multiple family members updated  2/1. Son and daughter in law upated on 2/4.    I spent  30   minutes of Critical Care time with this patient today.   Monica Becton, MD 11/18/2016, 12:46 PM [facility] Pager [phone] After 3 pm or if no answer, call [phone]

## 2016-11-18 NOTE — Progress Notes (Signed)
[facility] for Heparin Indication: DVT and atrial fibrillation  Allergies  Allergen Reactions  . Nickel Rash    Patient Measurements: Height: '5\' 5"'$  (165.1 cm) Weight: 154 lb 8.7 oz (70.1 kg) IBW/kg (Calculated) : 57 Heparin Dosing Weight: 68kg  Vital Signs: Temp: 98.8 F (37.1 C) (02/04 1600) Temp Source: Oral (02/04 1600) BP: 131/74 (02/04 2000) Pulse Rate: 84 (02/04 2000)  Labs:  Recent Labs  11/16/16 0450 11/17/16 0358 11/18/16 0430 11/18/16 2000  HGB 9.1* 9.4* 9.1*  --   HCT 27.7* 29.3* 28.5*  --   PLT 293 368 370  --   HEPARINUNFRC 0.69 0.60 0.59 0.17*  CREATININE 1.22* 1.08* 1.03*  --     Estimated Creatinine Clearance: 49.2 mL/min (by C-G formula based on SCr of 1.03 mg/dL (H)).   Assessment: 45 yoF with dopplers positive for bilateral peroneal DVT also with AFib. Argatroban stopped 2/1 and transitioned back to heparin as HIT negative. Heparin decreased to 800 units/hr earlier today due to bleeding around the airway. Spoke to [employer] and no further bleeding noted -heparin level below goal   Goal of Therapy:  Heparin level 0.3- 0.7 Monitor platelets by anticoagulation protocol: Yes   Plan:  -Increase heparin to 900 units/hr -CBC and heparin level daily  Hildred Laser, Pharm D 11/18/2016 9:06 PM

## 2016-11-18 NOTE — Progress Notes (Signed)
13 Days Post-Op Procedure(s) (LRB): VIDEO BRONCHOSCOPY WITH INSERTION OF INTERBRONCHIAL VALVE (IBV) times three (N/A) Subjective: Remains with thick airway secretions Received tracheal suctioning again today  chest x-ray with minimal aeration of right lung Sputum cultures still pending, remains on [place] Oxygen requirement increased to high flow nasal cannula Chest tube drainage remained significant greater than 500 cc per day 2 feeds at goal No air leak   Objective: Vital signs in last 24 hours: Temp:  [97.3 F (36.3 C)-98.3 F (36.8 C)] 97.3 F (36.3 C) (02/04 0329) Pulse Rate:  [75-148] 110 (02/04 0700) Cardiac Rhythm: Normal sinus rhythm (02/03 2200) Resp:  [19-34] 33 (02/04 0700) BP: (99-129)/(58-89) 108/62 (02/04 0700) SpO2:  [84 %-100 %] 100 % (02/04 0812) Weight:  [154 lb 8.7 oz (70.1 kg)] 154 lb 8.7 oz (70.1 kg) (02/04 0500)  Hemodynamic parameters for last 24 hours:  Afebrile  Intake/Output from previous day: 02/03 0701 - 02/04 0700 In: 3184.8 [I.V.:1284.8; NG/GT:1750; IV Piggyback:150] Out: 2759 [Urine:1695; Emesis/NG output:60; Stool:300; Chest MQKM:638] Intake/Output this shift: No intake/output data recorded.  Bronchial breath sounds on right side Neurologically intact Extremities pink and warm No airleak, chest tube drainage xanthochromic  Lab Results:  Recent Labs  11/17/16 0358 11/18/16 0430  WBC 16.2* 13.2*  HGB 9.4* 9.1*  HCT 29.3* 28.5*  PLT 368 370   BMET:  Recent Labs  11/17/16 0358 11/18/16 0430  NA 152* 151*  K 3.4* 3.3*  CL 116* 113*  CO2 30 30  GLUCOSE 115* 133*  BUN 36* 33*  CREATININE 1.08* 1.03*  CALCIUM 8.4* 8.5*    PT/INR: No results for input(s): LABPROT, INR in the last 72 hours. ABG    Component Value Date/Time   PHART 7.376 11/12/2016 1519   HCO3 26.4 11/12/2016 1519   TCO2 28 11/12/2016 1519   ACIDBASEDEF 5.0 (H) 11/11/2016 0423   O2SAT 99.0 11/12/2016 1519   CBG (last 3)   Recent Labs  11/17/16 2323  11/18/16 0327 11/18/16 0848  GLUCAP 130* 102* 130*    Assessment/Plan: S/P Procedure(s) (LRB): VIDEO BRONCHOSCOPY WITH INSERTION OF INTERBRONCHIAL VALVE (IBV) times three (N/A) Continue to NTS as needed Out of bed to chair Mucomyst and flutter valve Follow-up sputum culture, cover [place] with IV Diflucan   LOS: 20 days    Tharon Aquas Trigt III 11/18/2016

## 2016-11-18 NOTE — Progress Notes (Signed)
After getting back to bed and upon completion of nebulizer treatment, patient's oxygen saturation decreased to 80's, heart rate 120's-140's(afib); patient without any complaints of dyspnea, no increased work of breathing noted; breath sounds congested with rhonchi bilaterally; patient with weak cough. RT present at beside; oxygen increased to 15L via high flow nasal cannula; patient nasotracheal suction, with minimal amount bloody secretions obtained. Oxygen saturations increased to 90's; patient resting comfortably in bed.

## 2016-11-18 NOTE — Progress Notes (Signed)
CT surgery p.m. Rounds  Patient up in chair Oxygen requirement has improved over the course of the day now on 35% mask NTS thick airway secretions again this p.m. Tubular breath sounds remain on right side We'll hold breakfast in a.m. in case patient needs bronchoscopy Culture of tracheal aspirate still pending, patient covered with Tressie Ellis and IV Diflucan Core track was displaced placed today, team will place one tomorrow

## 2016-11-19 ENCOUNTER — Inpatient Hospital Stay (HOSPITAL_COMMUNITY): Payer: Medicare Other

## 2016-11-19 LAB — GLUCOSE, CAPILLARY
Glucose-Capillary: 100 mg/dL — ABNORMAL HIGH (ref 65–99)
Glucose-Capillary: 107 mg/dL — ABNORMAL HIGH (ref 65–99)
Glucose-Capillary: 89 mg/dL (ref 65–99)
Glucose-Capillary: 91 mg/dL (ref 65–99)
Glucose-Capillary: 95 mg/dL (ref 65–99)
Glucose-Capillary: 97 mg/dL (ref 65–99)
Glucose-Capillary: 99 mg/dL (ref 65–99)

## 2016-11-19 LAB — BASIC METABOLIC PANEL
Anion gap: 3 — ABNORMAL LOW (ref 5–15)
BUN: 28 mg/dL — ABNORMAL HIGH (ref 6–20)
CO2: 32 mmol/L (ref 22–32)
Calcium: 8.2 mg/dL — ABNORMAL LOW (ref 8.9–10.3)
Chloride: 114 mmol/L — ABNORMAL HIGH (ref 101–111)
Creatinine, Ser: 1 mg/dL (ref 0.44–1.00)
GFR calc Af Amer: >60 mL/min (ref >60)
GFR calc non Af Amer: 55 mL/min — ABNORMAL LOW (ref >60)
Glucose, Bld: 112 mg/dL — ABNORMAL HIGH (ref 65–99)
Potassium: 3.3 mmol/L — ABNORMAL LOW (ref 3.5–5.1)
Sodium: 149 mmol/L — ABNORMAL HIGH (ref 135–145)

## 2016-11-19 LAB — CULTURE, RESPIRATORY W GRAM STAIN

## 2016-11-19 LAB — HEPARIN LEVEL (UNFRACTIONATED)
Heparin Unfractionated: 0.26 IU/mL — ABNORMAL LOW (ref 0.30–0.70)
Heparin Unfractionated: 0.44 IU/mL (ref 0.30–0.70)
Heparin Unfractionated: 0.49 [IU]/mL (ref 0.30–0.70)

## 2016-11-19 MED ORDER — SODIUM CHLORIDE 0.9 % IV SOLN
30.0000 meq | Freq: Once | INTRAVENOUS | Status: AC
  Administered 2016-11-19: 30 meq via INTRAVENOUS
  Filled 2016-11-19: qty 15

## 2016-11-19 NOTE — Progress Notes (Signed)
Speech Language Pathology Treatment: Dysphagia  Patient Details Name: [name] MRN: [id number] DOB: 12-30-1945 Today's Date: 11/19/2016 Time: 4650-3546 SLP Time Calculation (min) (ACUTE ONLY): 17 min  Assessment / Plan / Recommendation Clinical Impression  Thorough oral care complete to increase safety with po trials. Oral cavity remains dry, ulcerated but with improvement today from initial evaluation. Patient alert. Additionally, vocal quality improving. Remains hoarse but audible from a 10 foot distance, indicating improved glottal closure. Patient able to self feed po trials (ice chips/H2O) with min verbal cueing for small bolus size. Overt s/s of aspiration persist characterized by wet vocal quality and delayed coughing. Max verbal cues provided for immediate throat clear/cough which was successful to clear wet voice when occurring. Discussed progress with patient. Patient remains NPO, now without nutritional support. Recommend MBS to determine least restrictive diet. Ability to advance guarded given continued dysphagia at bedside.    HPI HPI: 71 y.o. 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 Initially was ambulatory in the ICU and out of bed without difficulty. She later had worsening hypoxia and respiratory status prompting initiation of BiPAP support and PCCM consultation.  Course complicated by shock thought to be sepsis from PNA but no positive cultures, AF with RVR, bilateral LE DVT.  Intubated for hypoxic respiratory failure 1/26.        SLP Plan  MBS     Recommendations  Diet recommendations: NPO Medication Administration: Via alternative means                Oral Care Recommendations: Oral care QID Follow up Recommendations: Inpatient Rehab Plan: [place] MA,  CCC-SLP [phone]    Lydia Meng [name] 11/19/2016, 10:18 AM

## 2016-11-19 NOTE — Progress Notes (Signed)
Physical Therapy Treatment Patient Details Name: [name] MRN: [id number] DOB: June 10, 1946 Today's Date: 11/19/2016    History of Present Illness 71 y.o. 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 Initially was ambulatory in the ICU and out of bed without difficulty. She later had worsening hypoxia and  Intubated for hypoxic respiratory failure 9/29  Course complicated by shock thought to be sepsis from PNA but no positive cultures, AF with RVR, bilateral LE DVT.  Patient extubated 11/14/16.    PT Comments    Pt with anxiety but motivated to participate in therapy. Pt with decreased activity tolerance and quick onset of fatigue. Pt did tolerate 15' of ambulation with eva walker this date. Pt given LE HEP. RN encouraged to remind pt.  Acute PT to con't to follow to progress mobility as able.   Follow Up Recommendations  CIR;Supervision for mobility/OOB     Equipment Recommendations  None recommended by PT    Recommendations for Other Services Rehab consult;OT consult     Precautions / Restrictions Precautions Precautions: Fall Precaution Comments: chest tube, catheter, flexiseal Restrictions Weight Bearing Restrictions: No    Mobility  Bed Mobility Overal bed mobility: Needs Assistance Bed Mobility: Rolling;Sidelying to Sit Rolling: Min guard Sidelying to sit: Min assist;Mod assist       General bed mobility comments: v/c's for sequencing task but did well, labored effort for pushing self up into sitting  Transfers Overall transfer level: Needs assistance Equipment used: Rolling walker (2 wheeled) Transfers: Sit to/from Stand Sit to Stand: Mod assist;Max assist         General transfer comment: used rocking method to get momentum, mod/maxA for initial power up transitioning to mod, pt was mod when pushing  up from arm rest of chair  Ambulation/Gait Ambulation/Gait assistance: +2 safety/equipment;Mod assist Ambulation Distance (Feet): 15 Feet (x1, 2'x1, marched x 5 reps in place) Assistive device:  (eva walker) Gait Pattern/deviations: Step-through pattern;Decreased stride length Gait velocity: impulsively quick v/c's to slow down Gait velocity interpretation: Below normal speed for age/gender General Gait Details: decreased step height, v/c's for deep breathing and to slow down due to anxiety, pt fatigues quickly. HR 88-90, SpO2 >92% on 6 LO2 via [place]   Stairs            Wheelchair Mobility    Modified Rankin (Stroke Patients Only)       Balance Overall balance assessment: Needs assistance Sitting-balance support: Bilateral upper extremity supported;Feet supported Sitting balance-Leahy Scale: Poor     Standing balance support: Bilateral upper extremity supported Standing balance-Leahy Scale: Poor Standing balance comment: requires physical assist and eva walker                    Cognition Arousal/Alertness: Awake/alert Behavior During Therapy: Anxious;Flat affect Overall Cognitive Status: Within Functional Limits for tasks assessed                 General Comments: easily defeated/hard on self, max encouragement to push self    Exercises General Exercises - Lower Extremity Ankle Circles/Pumps: AROM;Both;10 reps;Supine Quad Sets: AROM;Both;5 reps;Seated Gluteal Sets: AROM;Both;10 reps;Seated    General Comments        Pertinent Vitals/Pain Pain Assessment: No/denies pain    Home Living  Prior Function            PT Goals (current goals can now be found in the care plan section) Acute Rehab PT Goals Patient Stated Goal: none stated Progress towards PT goals: Progressing toward goals    Frequency    Min 3X/week      PT Plan Current plan remains appropriate    Co-evaluation             End of  Session Equipment Utilized During Treatment: Gait belt;Oxygen (6Lo2 via ) Activity Tolerance: Patient limited by fatigue Patient left: in chair;with call bell/phone within reach     Time: 0852-0922 PT Time Calculation (min) (ACUTE ONLY): 30 min  Charges:  $Gait Training: 8-22 mins $Therapeutic Exercise: 8-22 mins                    G Codes:      Koleen Distance Shayanne Gomm 11/29/16, 10:23 AM   Kittie Plater, PT, DPT Pager #: [phone] Office #: [phone]

## 2016-11-19 NOTE — Progress Notes (Signed)
PULMONARY / CRITICAL CARE MEDICINE   Name: [name] MRN: [id number] DOB: 1946/09/17    ADMISSION DATE:  10/29/2016 CONSULTATION DATE:  11/07/2016  REFERRING MD:  Lanelle Bal, M.D. / CVTS  CHIEF COMPLAINT:  Acute Hypoxic Respiratory Failure  BRIEF SUMMARY:  71 y.o. 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 Initially was ambulatory in the ICU and out of bed without difficulty. She later had worsening hypoxia and  Intubated for hypoxic respiratory failure 7/59  Course complicated by shock thought to be sepsis from PNA but no positive cultures, AF with RVR, bilateral LE DVT.   SUBJECTIVE:    Note abx restarted on 2/3 CXR today reviewed > R volume loss with rightward shift, chest tubes in place Feels weak  VITAL SIGNS: BP 133/64   Pulse 62   Temp 98.2 F (36.8 C) (Oral)   Resp (!) 21   Ht '5\' 5"'$  (1.651 m)   Wt 70 kg (154 lb 5.2 oz)   SpO2 100%   BMI 25.68 kg/m   HEMODYNAMICS:    VENTILATOR SETTINGS: FiO2 (%):  [35 %-45 %] 35 %  INTAKE / OUTPUT: I/O last 3 completed shifts: In: 4450.2 [I.V.:2750.2; NG/GT:1550; IV Piggyback:150] Out: 1638 [Urine:2215; Stool:300; Chest Tube:755]  PHYSICAL EXAMINATION:  General: weak elderly woman, marginal cough HEENT: no jvd or stridor Neuro: awake and alert, globally weak, non-focal CV: regular, no M PULM: poor air movement on the R, chest tubes > no apparent air leak today 2/5 GI: soft, benign Extremities: 1+ LE edema Skin: no rash    LABS:  Recent Labs Lab 11/16/16 0450 11/17/16 0358 11/18/16 0430  HGB 9.1* 9.4* 9.1*  HCT 27.7* 29.3* 28.5*  WBC 14.5* 16.2* 13.2*  PLT 293 368 370    Recent Labs Lab 11/14/16 0345  11/15/16 1837 11/16/16 0450 11/17/16 0358 11/18/16 0430 11/19/16 0500  NA 142  < > 151* 153* 152* 151* 149*  K 3.2*  < > 3.4* 3.3*  3.4* 3.3* 3.3*  CL 109  < > 117* 117* 116* 113* 114*  CO2 24  < > '26 28 30 30 '$ 32  GLUCOSE 124*  < > 122* 123* 115* 133* 112*  BUN 54*  < > 48* 46* 36* 33* 28*  CREATININE 1.70*  < > 1.40* 1.22* 1.08* 1.03* 1.00  CALCIUM 7.9*  < > 8.5* 8.5* 8.4* 8.5* 8.2*  MG 1.8  --   --   --   --   --   --   PHOS 2.8  --   --   --   --   --   --   < > = values in this interval not displayed.  Recent Labs Lab 11/12/16 1519  PHART 7.376  PCO2ART 45.1  PO2ART 119.0*  HCO3 26.4  TCO2 28  O2SAT 99.0    STUDIES:  PFT 08/24/16 >> FVC 2.54 L (89%) FEV1 1.22 L (57%) FEV1/FVC 0.48 FEF 25-75 0.44 L (24%) negative bronchial dilator response TLC 5.19 L (120%) RV 155% ERV 76% DLCO uncorrected 52% TTE 1/24 >>  Poor quality study. Unable to assess RV. No pericardial effusion. LV with mild LVH but normal in size. EF 60-65%. Unable to assess wall motion. Grade 1 diastolic dysfunction. BILAT LE VENOUS DUPLEX 1/24 >>  There is evidence of acute deep vein thrombosis involving the peroneal veins of the right and  left lower extremities. Renal US 1/27 >> no hydronephrosis or stones  MICROBIOLOGY: MRSA PCR 1/11:  Negative Right Bronchial Washing 1/22:  Negative Blood Ctx x2 1/24 >> ng Urine Ctx 1/24 >> negative  ANTIBIOTICS: Vancomycin 1/24 >>1/30 Zosyn 1/24 >>1/31 Ceftazidime 2/3  >   SIGNIFICANT EVENTS: 1/15 - Admit w/ RUL Lobectomy for Stage IB squamous cell  1/22 - Bronchoscopy with 3 right-sided endobronchial valves for persistent air leak 1/26 - Hypoxic respiratory failure, intubated  1/28 - Heparin d/c'd due to thrombocytopenia, HIT panel pending.  Changed to argatroban. 1/30 still with air leak, weaning well 1/31 extubated  LINES/TUBES: Right Chest Tubes 1/15 >> R IJ CLV 1/24 >> Foley 1/24 >>   ASSESSMENT / PLAN:  71 y.o. female admitted for right upper lobectomy completed on 1/15. Patient had subsequent persistent air leak. Patient's shock and acute hypoxic respiratory failure likely due to  evolving sepsis from PNA (NOS). Source likely from her lung given recent surgery. Course complicated by BPF, AF-RVR & DVT  PULMONARY A: S/P Acute Hypoxic Respiratory Failure secondary to PNA, treated.  RUL NSCLCA - s/p RULobectomy  Concern for new HCAP 2/3 abx restarted Persistent Right Pneumothorax & Air Leak post-lobectomy - Chest tubes in place & Endobronchial valves placed 1/22 Significant R lung atx and volume loss Moderate to Severe COPD P:   continue chest tubes to suction, no air leak currently but likely due to poor aeration of the R lung Pulm hygiene w BD's, mucomyst, CPT. Discussed possible FOB with Dr gerhardt today, will try to defer. reconsider if no RLL clearance or if we need for cx data Consider BiPAP to better aerate the R.  Continue abx as below for now.    CARDIOVASCULAR A:  S/P Septic Shock likely secondary to PNA, resolved Afib with RVR , converted sinus 1/31 Bilateral Calf Vein/Peroneal DVT H/O HTN, HLD  P:  Continue amiodarone Consider diuresis, goal I=O Anticoagulation    RENAL A:   Acute Renal Failure Hypernatremia  Hypokalemia P:  Cont free water >> may need to place NGT to accomplish if she cannot pass swallow eval Restart TF's if fails swallow   GASTROINTESTINAL A:   Nausea and Vomiting - resolved R/o dysphagia P:   Needs formal swallow eval today Diet and free water PO if swallow eval acceptable, otherwise replace NGT  HEMATOLOGIC A:   Anticoagulation secondary to DVT  Thrombocytopenia 31-> resolved Changed to argatroban 1/28 due to thrombocytopenia, HIT neg ELISA so back on heparin drip P: Continue heparin   INFECTIOUS A:   S/P Septic Shock secondary to PNA   Leucocytosis P:   ceftaz 2/3 Consider FOB for cx data  ENDOCRINE A:   Hyperglycemia controlled  P:   On SSI  NEUROLOGIC A:   Acute Metabolic Encephalopathy -resolved Post Operative Pain   P:   Pain control adequate currently  FAMILY  - Updates:   Multiple family members updated  2/1. Discussed with family at bedside on 2/5  Independent CC time 35 minutes  Baltazar Apo, MD, PhD 11/19/2016, 9:18 AM [facility] [phone] or if no answer [phone]

## 2016-11-19 NOTE — Progress Notes (Signed)
[place] for Heparin Indication: DVT and atrial fibrillation  Allergies  Allergen Reactions  . Nickel Rash    Patient Measurements: Height: '5\' 5"'$  (165.1 cm) Weight: 154 lb 5.2 oz (70 kg) IBW/kg (Calculated) : 57 Heparin Dosing Weight: 68kg  Vital Signs: Temp: 98.2 F (36.8 C) (02/05 2028) Temp Source: Oral (02/05 2028) BP: 145/77 (02/05 1100) Pulse Rate: 66 (02/05 1900)  Labs:  Recent Labs  11/17/16 0358 11/18/16 0430  11/19/16 0440 11/19/16 0500 11/19/16 1407 11/19/16 2000  HGB 9.4* 9.1*  --   --   --   --   --   HCT 29.3* 28.5*  --   --   --   --   --   PLT 368 370  --   --   --   --   --   HEPARINUNFRC 0.60 0.59  < > 0.26*  --  0.44 0.49  CREATININE 1.08* 1.03*  --   --  1.00  --   --   < > = values in this interval not displayed.  Estimated Creatinine Clearance: 50.7 mL/min (by C-G formula based on SCr of 1 mg/dL).   Assessment: 34 yoF with dopplers positive for bilateral peroneal DVT also with AFib. Argatroban stopped 2/1 and transitioned back to heparin as HIT negative. Noted, heparin decreased to 800 units/hr on 2/4 due to bleeding around the airway. CBC stable.  Heparin level now therapeutic x2 (0.49) on 1000 units/h. No further bleeding reported.   Goal of Therapy:  Heparin level 0.3- 0.7 Monitor platelets by anticoagulation protocol: Yes   Plan:  -Heparin at 1000 units/hr -Daily heparin level/CBC -Monitor for s/sx bleeding  Erin Hearing PharmD., BCPS Clinical Pharmacist Pager [phone] 11/19/2016 8:43 PM

## 2016-11-19 NOTE — Progress Notes (Signed)
Pt. started on EZ-PAP, (IPPB no longer being used by RT dept.), initially used 4 lpm air for resistance, completed with 2 lpm due pt. stating,"getting tired" along with Blue flutter @ end of set-up with scheduled med aerosol for added percussion to help aerate Right lung from last CXR results, pt. tolerated well with moderate productive cough.

## 2016-11-19 NOTE — Progress Notes (Signed)
ANTICOAGULATION CONSULT NOTE - Follow Up Consult  Pharmacy Consult for Heparin  Indication: atrial fibrillation and DVT  Allergies  Allergen Reactions  . Nickel Rash    Patient Measurements: Height: '5\' 5"'$  (165.1 cm) Weight: 154 lb 8.7 oz (70.1 kg) IBW/kg (Calculated) : 57 Vital Signs: Temp: 98.2 F (36.8 C) (02/05 0400) Temp Source: Oral (02/05 0400) BP: 131/63 (02/05 0500) Pulse Rate: 82 (02/05 0500)  Labs:  Recent Labs  11/17/16 0358 11/18/16 0430 11/18/16 2000 11/19/16 0440 11/19/16 0500  HGB 9.4* 9.1*  --   --   --   HCT 29.3* 28.5*  --   --   --   PLT 368 370  --   --   --   HEPARINUNFRC 0.60 0.59 0.17* 0.26*  --   CREATININE 1.08* 1.03*  --   --  1.00    Estimated Creatinine Clearance: 50.7 mL/min (by C-G formula based on SCr of 1 mg/dL).   Assessment: Heparin for DVT/afib. Heparin level low. No further issues with bleeding per RN.   Goal of Therapy:  Heparin level 0.3-0.7 units/ml Monitor platelets by anticoagulation protocol: Yes   Plan:  -Inc heparin to 1000 units/hr -1400 HL  Jlyn Cerros 11/19/2016,5:47 AM

## 2016-11-19 NOTE — Progress Notes (Signed)
[facility] for Heparin Indication: DVT and atrial fibrillation  Allergies  Allergen Reactions  . Nickel Rash    Patient Measurements: Height: '5\' 5"'$  (165.1 cm) Weight: 154 lb 5.2 oz (70 kg) IBW/kg (Calculated) : 57 Heparin Dosing Weight: 68kg  Vital Signs: Temp: 97.9 F (36.6 C) (02/05 1100) Temp Source: Oral (02/05 1100) BP: 145/77 (02/05 1100) Pulse Rate: 66 (02/05 1100)  Labs:  Recent Labs  11/17/16 0358 11/18/16 0430 11/18/16 2000 11/19/16 0440 11/19/16 0500 11/19/16 1407  HGB 9.4* 9.1*  --   --   --   --   HCT 29.3* 28.5*  --   --   --   --   PLT 368 370  --   --   --   --   HEPARINUNFRC 0.60 0.59 0.17* 0.26*  --  0.44  CREATININE 1.08* 1.03*  --   --  1.00  --     Estimated Creatinine Clearance: 50.7 mL/min (by C-G formula based on SCr of 1 mg/dL).   Assessment: 48 yoF with dopplers positive for bilateral peroneal DVT also with AFib. Argatroban stopped 2/1 and transitioned back to heparin as HIT negative. Noted, heparin decreased to 800 units/hr on 2/4 due to bleeding around the airway. CBC stable.  Heparin level now therapeutic (0.44) on 1000 units/h. No further bleeding reported.   Goal of Therapy:  Heparin level 0.3- 0.7 Monitor platelets by anticoagulation protocol: Yes   Plan:  -Heparin at 1000 units/hr -8h heparin level to confirm -Daily heparin level/CBC -Monitor for s/sx bleeding   Elicia Lamp, PharmD, BCPS Clinical Pharmacist 11/19/2016 2:52 PM

## 2016-11-19 NOTE — Progress Notes (Signed)
Rehab Admissions Coordinator Note:  Patient was screened by Retta Diones for appropriateness for an Inpatient Acute Rehab Consult.  At this time, we are recommending Inpatient Rehab consult.  Jodell Cipro M 11/19/2016, 8:21 AM  I can be reached at [phone].

## 2016-11-19 NOTE — Progress Notes (Signed)
Modified Barium Swallow Progress Note  Patient Details  Name: KANDEE ESCALANTE MRN: [id number] Date of Birth: [birth year]  Today's Date: 11/19/2016  Modified Barium Swallow completed.  Full report located under Chart Review in the Imaging Section.  Brief recommendations include the following:  Clinical Impression  Patient presents with a severe dysphagia with oral, pharyngeal, and esophageal components. Delayed oral transit due to generalized weakness and oral pain (buccal, labial ulcerations). Patient with delayed swallow initiation, base of tongue weakness, decreased epiglottic deflection, laryngeal closure, and pharyngeal constriction s/p prolonged intubation with severe deconditioning all result in poor clearance of bolus from pharynx with resultant penetration of all consistencies provided. Attempted chin tuck without improved function. Cued cough weak and unsuccessful to fully clear the airway. Cued dry swallow ineffective to adequately clear pharynx and prevent penetration/aspiraiton of residue. Discussed severity of deficits with patient and recommendations for continued NPO status. Patient understandably verbalizing diappointment. Would consider allowing ice chips PRN for pleasure and facilitation of hydration to mucosa and use of swallowing musculature ONLY if MD feels that patient can handle aspiration of clean ice chips from a pulmonary standpoint. Will defer this decision to MD. Have paged but no call returned at this time.    Swallow Evaluation Recommendations       SLP Diet Recommendations: NPO;Alternative means - temporary       Medication Administration: Via alternative means               Oral Care Recommendations: Oral care QID     Kieran Arreguin Perry, CCC-SLP [phone]    Estes Lehner [name] 11/19/2016,1:45 PM

## 2016-11-19 NOTE — Progress Notes (Signed)
      [address]       [place] [address]             [phone]       Resting presently  BP (!) 145/77   Pulse 74   Temp 97.9 F (36.6 C) (Oral)   Resp (!) 22   Ht '5\' 5"'$  (1.651 m)   Wt 154 lb 5.2 oz (70 kg)   SpO2 100%   BMI 25.68 kg/m    Intake/Output Summary (Last 24 hours) at 11/19/16 1755 Last data filed at 11/19/16 0931  Gross per 24 hour  Intake           1714.8 ml  Output             1370 ml  Net            344.8 ml   Failed MBS- still NPO- will give small amounts of ice chips  Remo Lipps C. Roxan Hockey, MD [facility] [phone]

## 2016-11-19 NOTE — Progress Notes (Signed)
[facility] DAILY ICU PROGRESS NOTE                   [address]            [place] [address]          [phone]   14 Days Post-Op Procedure(s) (LRB): VIDEO BRONCHOSCOPY WITH INSERTION OF INTERBRONCHIAL VALVE (IBV) times three (N/A)  Total Length of Stay:  LOS: 21 days   Subjective: She hopes she passes her swallow test because she really wants liquids.  Objective: Vital signs in last 24 hours: Temp:  [97.4 F (36.3 C)-98.8 F (37.1 C)] 97.5 F (36.4 C) (02/05 0900) Pulse Rate:  [62-103] 62 (02/05 0700) Cardiac Rhythm: Normal sinus rhythm (02/05 0400) Resp:  [20-36] 21 (02/05 0700) BP: (116-145)/(63-92) 133/64 (02/05 0700) SpO2:  [86 %-100 %] 100 % (02/05 0718) FiO2 (%):  [35 %-45 %] 35 % (02/05 0000) Weight:  [154 lb 5.2 oz (70 kg)] 154 lb 5.2 oz (70 kg) (02/05 0500)  Filed Weights   11/17/16 0500 11/18/16 0500 11/19/16 0500  Weight: 155 lb 10.3 oz (70.6 kg) 154 lb 8.7 oz (70.1 kg) 154 lb 5.2 oz (70 kg)    Weight change: -3.5 oz (-0.1 kg)      Intake/Output from previous day: 02/04 0701 - 02/05 0700 In: 2406.8 [I.V.:1806.8; NG/GT:500; IV Piggyback:100] Out: 1935 [Urine:1450; Chest Tube:485]  Intake/Output this shift: No intake/output data recorded.  Current Meds: Scheduled Meds: . acetylcysteine  2 mL Nebulization Q6H  . cefTAZidime (FORTAZ)  IV  1 g Intravenous Q12H  . chlorhexidine  15 mL Mouth Rinse BID  . Chlorhexidine Gluconate Cloth  6 each Topical Daily  . feeding supplement (PRO-STAT SUGAR FREE 64)  40 mL Per Tube BID  . feeding supplement (VITAL HIGH PROTEIN)  1,000 mL Per Tube Q24H  . fluconazole (DIFLUCAN) IV  100 mg Intravenous Q24H  . free water  200 mL Per Tube Q6H  . Gerhardt's butt cream   Topical BID  . insulin aspart  0-15 Units Subcutaneous Q4H  . ipratropium  0.5 mg Nebulization Q6H  . levalbuterol  0.63 mg Nebulization Q6H  . magic mouthwash  5 mL Oral TID  . mouth rinse  15 mL Mouth Rinse q12n4p  . pantoprazole  sodium  40 mg Per Tube Q1200  . potassium chloride (KCL MULTIRUN) 30 mEq in 265 mL IVPB  30 mEq Intravenous Once  . sodium chloride flush  10-40 mL Intracatheter Q12H   Continuous Infusions: . sodium chloride Stopped (11/15/16 1500)  . amiodarone 30 mg/hr (11/19/16 0850)  . dextrose 50 mL/hr at 11/19/16 0700  . heparin 1,000 Units/hr (11/19/16 0700)   PRN Meds:.Place/Maintain arterial line **AND** sodium chloride, acetaminophen, fentaNYL (SUBLIMAZE) injection, levalbuterol, potassium chloride (KCL MULTIRUN) 30 mEq in 265 mL IVPB, sodium chloride flush  General appearance: Frail, alert, appears stated age and no distress.  Heart: RRR Lungs: Mostly clear on the left and nearly inaudible on the right Abdomen: soft, non-tender; bowel sounds normal; no masses,  no organomegaly Extremities: Mild LE edema Wound: Clean and dry Chest tubes: to suction, no air leak  Lab Results: CBC: Recent Labs  11/17/16 0358 11/18/16 0430  WBC 16.2* 13.2*  HGB 9.4* 9.1*  HCT 29.3* 28.5*  PLT 368 370   BMET:  Recent Labs  11/18/16 0430 11/19/16 0500  NA 151* 149*  K 3.3* 3.3*  CL 113* 114*  CO2 30 32  GLUCOSE 133* 112*  BUN  33* 28*  CREATININE 1.03* 1.00  CALCIUM 8.5* 8.2*    CMET: Lab Results  Component Value Date   WBC 13.2 (H) 11/18/2016   HGB 9.1 (L) 11/18/2016   HCT 28.5 (L) 11/18/2016   PLT 370 11/18/2016   GLUCOSE 112 (H) 11/19/2016   TRIG 137 11/12/2016   ALT 25 11/11/2016   AST 32 11/11/2016   NA 149 (H) 11/19/2016   K 3.3 (L) 11/19/2016   CL 114 (H) 11/19/2016   CREATININE 1.00 11/19/2016   BUN 28 (H) 11/19/2016   CO2 32 11/19/2016   INR 1.04 11/11/2016      PT/INR: No results for input(s): LABPROT, INR in the last 72 hours. Radiology: Dg Chest Port 1 View  Result Date: 11/19/2016 CLINICAL DATA:  History of lung carcinoma.  Chest tube in place. EXAM: PORTABLE CHEST 1 VIEW COMPARISON:  Single-view of the chest 11/17/2016 and 11/18/2016. FINDINGS: Two right chest  tubes and right IJ catheter remain in place. Near complete whiteout of the right chest persists with a small area of aeration in the upper lung zone again seen, unchanged. Mild left basilar atelectasis is noted. Small left effusion is seen. No pneumothorax. Aortic atherosclerosis noted. IMPRESSION: No change in the appearance of the chest with 2 right chest tubes and near complete whiteout of the right hemithorax again seen. Very small left effusion basilar atelectasis. Electronically Signed   By: Inge Rise M.D.   On: 11/19/2016 07:31     Assessment/Plan: S/P Procedure(s) (LRB): VIDEO BRONCHOSCOPY WITH INSERTION OF INTERBRONCHIAL VALVE (IBV) times three (N/A)  1.CV-SR in the 80's this am. 2.Pulmonary-s/p RUL 1/18 and s/p EBV valves on 01/22 for persistent air leak. Chest tubes with 485 cc of yellow pleural fluid. Chest tubes to suction and with no air leak.On 6 liters of oxygen via [place]. CXR shows near whiteout on the right. Monitor need for bronchoscopy. Continue flutter valve and incentive spirometer. 3. GI-going for swallow study this am 4. ABL anemia-H and H yesterday 9.1 and 28.5 5.ID-On Fluconazole and Marny Lowenstein PA-C 11/19/2016 11:02 AM

## 2016-11-20 ENCOUNTER — Inpatient Hospital Stay (HOSPITAL_COMMUNITY): Payer: Medicare Other

## 2016-11-20 LAB — BASIC METABOLIC PANEL WITH GFR
Anion gap: 7 (ref 5–15)
BUN: 24 mg/dL — ABNORMAL HIGH (ref 6–20)
CO2: 30 mmol/L (ref 22–32)
Calcium: 8.3 mg/dL — ABNORMAL LOW (ref 8.9–10.3)
Chloride: 109 mmol/L (ref 101–111)
Creatinine, Ser: 0.95 mg/dL (ref 0.44–1.00)
GFR calc Af Amer: >60 mL/min
GFR calc non Af Amer: 59 mL/min — ABNORMAL LOW
Glucose, Bld: 144 mg/dL — ABNORMAL HIGH (ref 65–99)
Potassium: 3.4 mmol/L — ABNORMAL LOW (ref 3.5–5.1)
Sodium: 146 mmol/L — ABNORMAL HIGH (ref 135–145)

## 2016-11-20 LAB — GLUCOSE, CAPILLARY
Glucose-Capillary: 112 mg/dL — ABNORMAL HIGH (ref 65–99)
Glucose-Capillary: 114 mg/dL — ABNORMAL HIGH (ref 65–99)
Glucose-Capillary: 125 mg/dL — ABNORMAL HIGH (ref 65–99)
Glucose-Capillary: 132 mg/dL — ABNORMAL HIGH (ref 65–99)
Glucose-Capillary: 136 mg/dL — ABNORMAL HIGH (ref 65–99)
Glucose-Capillary: 140 mg/dL — ABNORMAL HIGH (ref 65–99)

## 2016-11-20 LAB — CBC
HEMATOCRIT: 29 % — AB (ref 36.0–46.0)
HEMOGLOBIN: 9.1 g/dL — AB (ref 12.0–15.0)
MCH: 29.9 pg (ref 26.0–34.0)
MCHC: 31.4 g/dL (ref 30.0–36.0)
MCV: 95.4 fL (ref 78.0–100.0)
Platelets: 363 10*3/uL (ref 150–400)
RBC: 3.04 MIL/uL — AB (ref 3.87–5.11)
RDW: 17.3 % — ABNORMAL HIGH (ref 11.5–15.5)
WBC: 12.4 10*3/uL — ABNORMAL HIGH (ref 4.0–10.5)

## 2016-11-20 LAB — HEPARIN LEVEL (UNFRACTIONATED): Heparin Unfractionated: 0.49 IU/mL (ref 0.30–0.70)

## 2016-11-20 MED ORDER — DEXTROSE 5 % IV SOLN
1.0000 g | Freq: Three times a day (TID) | INTRAVENOUS | Status: DC
  Administered 2016-11-20 – 2016-11-28 (×25): 1 g via INTRAVENOUS
  Filled 2016-11-20 (×26): qty 1

## 2016-11-20 MED ORDER — FUROSEMIDE 10 MG/ML IJ SOLN
40.0000 mg | Freq: Once | INTRAMUSCULAR | Status: AC
  Administered 2016-11-20: 40 mg via INTRAVENOUS
  Filled 2016-11-20: qty 4

## 2016-11-20 MED ORDER — POTASSIUM CHLORIDE 20 MEQ/15ML (10%) PO SOLN: 40.0000 meq | Freq: Once | ORAL | Status: DC

## 2016-11-20 MED ORDER — AMIODARONE HCL 200 MG PO TABS
200.0000 mg | ORAL_TABLET | Freq: Two times a day (BID) | ORAL | Status: DC
  Administered 2016-11-20 – 2016-12-05 (×30): 200 mg
  Filled 2016-11-20 (×31): qty 1

## 2016-11-20 MED ORDER — SODIUM CHLORIDE 0.9 % IV SOLN
30.0000 meq | Freq: Once | INTRAVENOUS | Status: AC
  Administered 2016-11-20: 30 meq via INTRAVENOUS
  Filled 2016-11-20: qty 15

## 2016-11-20 MED ORDER — VITAL AF 1.2 CAL PO LIQD
1000.0000 mL | ORAL | Status: DC
  Administered 2016-11-20 (×2): 1000 mL

## 2016-11-20 MED ORDER — POTASSIUM CHLORIDE 20 MEQ/15ML (10%) PO SOLN
20.0000 meq | Freq: Once | ORAL | Status: AC
  Administered 2016-11-20: 20 meq
  Filled 2016-11-20: qty 15

## 2016-11-20 MED ORDER — AMIODARONE PEDIATRIC ORAL SUSPENSION 5 MG/ML
200.0000 mg | Freq: Two times a day (BID) | ORAL | Status: DC
  Filled 2016-11-20: qty 40

## 2016-11-20 MED ORDER — VITAL HIGH PROTEIN PO LIQD
1000.0000 mL | ORAL | Status: DC
  Administered 2016-11-20: 1000 mL

## 2016-11-20 MED ORDER — BLISTEX MEDICATED EX OINT
TOPICAL_OINTMENT | CUTANEOUS | Status: DC | PRN
  Filled 2016-11-20: qty 6.3

## 2016-11-20 NOTE — Progress Notes (Signed)
[place] for Heparin Indication: DVT and atrial fibrillation  Allergies  Allergen Reactions  . Nickel Rash    Patient Measurements: Height: '5\' 5"'$  (165.1 cm) Weight: 152 lb 12.8 oz (69.3 kg) IBW/kg (Calculated) : 57 Heparin Dosing Weight: 68kg  Vital Signs: Temp: 98.5 F (36.9 C) (02/06 0600) Temp Source: Oral (02/06 0600) BP: 125/63 (02/06 0700) Pulse Rate: 65 (02/06 0700)  Labs:  Recent Labs  11/18/16 0430  11/19/16 0500 11/19/16 1407 11/19/16 2000 11/20/16 0349  HGB 9.1*  --   --   --   --  9.1*  HCT 28.5*  --   --   --   --  29.0*  PLT 370  --   --   --   --  363  HEPARINUNFRC 0.59  < >  --  0.44 0.49 0.49  CREATININE 1.03*  --  1.00  --   --  0.95  < > = values in this interval not displayed.  Estimated Creatinine Clearance: 53.1 mL/min (by C-G formula based on SCr of 0.95 mg/dL).   Assessment: 25 yoF with dopplers positive for bilateral peroneal DVT also with AFib. Argatroban stopped 2/1 and transitioned back to heparin as HIT negative. Noted, heparin decreased to 800 units/hr on 2/4 due to bleeding around the airway. CBC stable.  Heparin level remains therapeutic (0.49) on 1000 units/h. No further bleeding reported.   Goal of Therapy:  Heparin level 0.3- 0.7 Monitor platelets by anticoagulation protocol: Yes   Plan:  -Heparin at 1000 units/hr -Daily heparin level/CBC -Monitor for s/sx bleeding  Elicia Lamp, PharmD, BCPS Clinical Pharmacist 11/20/2016 8:22 AM

## 2016-11-20 NOTE — Progress Notes (Signed)
Patient ID: [name], female   DOB: 1946-05-03, 70 y.o.   MRN: [id number]  SICU Evening Rounds:  Hemodynamically stable in sinus rhythm on amio per tube.  sats 100%  Good urine output

## 2016-11-20 NOTE — Progress Notes (Addendum)
Nutrition Follow Up  DOCUMENTATION CODES:   Not applicable  INTERVENTION:    D/C Vital High Protein   Initiate Vital AF 1.2 at 60 ml/hr (1440 ml per day) to provide 1728 kcals, 108 gm protein, 1168 ml of free water daily  NUTRITION DIAGNOSIS:   Inadequate oral intake related to inability to eat as evidenced by NPO status, ongoing  GOAL:   Patient will meet greater than or equal to 90% of their needs, progressing  MONITOR:   TF tolerance, Diet advancement, Labs, Weight trends, Skin, I & O's  ASSESSMENT:   71 y.o. Female with septic shock and acute hypoxic respiratory failure. Septic source is likely pneumonia. Patient now developing delirium and mild hypercarbia given recent ABG. Lactic acidosis is improving from procalcitonin remains significantly elevated.   Pt s/p procedures 1/22: VIDEO BRONCHOSCOPY WITH INSERTION OF INTERBRONCHIAL VALVE (IBV) x 3  Pt s/p MBSS 2/5 >> SLP rec continued NPO status given severe dysphagia. New CORTRAK feeding tube placed 2/5 (tip at the duodenum). Vital High Protein formula currently infusing at 30 ml/hr. Free water flushes at 200 ml q 6 hrs. CBG's [id number].  Verbal with Read Back order received per Dr. Servando Snare to manage TF.  Diet Order:  Diet NPO time specified  Skin:  Wound (see comment) (skin tear to R buttocks)  Last BM:  2/4 (flexiseal)  Height:   Ht Readings from Last 1 Encounters:  11/17/16 '5\' 5"'$  (1.651 m)   Weight:   Wt Readings from Last 1 Encounters:  11/20/16 152 lb 12.8 oz (69.3 kg)   BMI:  Body mass index is 25.43 kg/m.  Estimated Nutritional Needs:   Kcal:  9833-8250  Protein:  105-115 gm  Fluid:  1.7-1.9 L  EDUCATION NEEDS:   No education needs identified at this time  Arthur Holms, RD, LDN Pager #: [phone] After-Hours Pager #: [phone]

## 2016-11-20 NOTE — Progress Notes (Signed)
Physical Therapy Treatment Patient Details Name: [name] MRN: [id number] DOB: 06/04/1946 Today's Date: 11/20/2016    History of Present Illness 71 y.o. 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 Initially was ambulatory in the ICU and out of bed without difficulty. She later had worsening hypoxia and  Intubated for hypoxic respiratory failure 7/12  Course complicated by shock thought to be sepsis from PNA but no positive cultures, AF with RVR, bilateral LE DVT.  Patient extubated 11/14/16.    PT Comments    Pt with improved ambulation tolerance this date however con't to be overall deconditioned and anxious re: falling and mobility due to SOB and weakness. Con't to Recommend CIR as pt motivated and was indep PTA.  Follow Up Recommendations  CIR;Supervision for mobility/OOB     Equipment Recommendations  None recommended by PT    Recommendations for Other Services Rehab consult;OT consult     Precautions / Restrictions Precautions Precautions: Fall Precaution Comments: chest tube, catheter, flexiseal Restrictions Weight Bearing Restrictions: No    Mobility  Bed Mobility Overal bed mobility: Needs Assistance Bed Mobility: Rolling;Sit to Sidelying Rolling: Min assist       Sit to sidelying: Mod assist General bed mobility comments: assist for LE managment back into bed  Transfers Overall transfer level: Needs assistance Equipment used: Rolling walker (2 wheeled) (eva walker) Transfers: Sit to/from Stand Sit to Stand: Mod assist;+2 physical assistance         General transfer comment: rocking method  Ambulation/Gait Ambulation/Gait assistance: +2 safety/equipment;Mod assist Ambulation Distance (Feet): 15 Feet (x1, 20'x1) Assistive device:  (eva walker) Gait Pattern/deviations: Step-through  pattern;Decreased stride length Gait velocity: slow Gait velocity interpretation: Below normal speed for age/gender General Gait Details: decreased step height, max encouragement to particpate, v/c's for relaxation breathing   Stairs            Wheelchair Mobility    Modified Rankin (Stroke Patients Only)       Balance Overall balance assessment: Needs assistance Sitting-balance support: Feet supported;Single extremity supported Sitting balance-Leahy Scale: Fair     Standing balance support: Bilateral upper extremity supported Standing balance-Leahy Scale: Poor Standing balance comment: requires physical assist and eva walker                    Cognition Arousal/Alertness: Awake/alert Behavior During Therapy: Anxious Overall Cognitive Status: Within Functional Limits for tasks assessed                 General Comments: less anxious today than yesterday, still required v/c's for relaxation breathing    Exercises General Exercises - Lower Extremity Ankle Circles/Pumps: AROM;Both;10 reps;Supine Quad Sets: AROM;Both;5 reps;Seated Gluteal Sets: AROM;Both;10 reps;Seated    General Comments        Pertinent Vitals/Pain Pain Assessment: No/denies pain    Home Living                      Prior Function            PT Goals (current goals can now be found in the care plan section) Acute Rehab PT Goals Patient Stated Goal: none stated Progress towards PT goals: Progressing toward goals    Frequency    Min 3X/week      PT Plan Current plan remains appropriate    Co-evaluation  End of Session Equipment Utilized During Treatment: Gait belt;Oxygen (4LO2 via [place]) Activity Tolerance: Patient limited by fatigue Patient left: in bed;with call bell/phone within reach;with bed alarm set;with family/visitor present     Time: 2542-7062 PT Time Calculation (min) (ACUTE ONLY): 25 min  Charges:  $Gait Training: 8-22  mins $Therapeutic Exercise: 8-22 mins                    G Codes:      Julian 11-23-16, 12:21 PM   Kittie Plater, PT, DPT Pager #: [phone] Office #: [phone]

## 2016-11-20 NOTE — Progress Notes (Addendum)
[facility] DAILY ICU PROGRESS NOTE                   [place]            [place] [address]          [phone]   15 Days Post-Op Procedure(s) (LRB): VIDEO BRONCHOSCOPY WITH INSERTION OF INTERBRONCHIAL VALVE (IBV) times three (N/A) Procedure(s) (LRB): VIDEO BRONCHOSCOPY WITH INSERTION OF INTERBRONCHIAL VALVE (IBV) times three (N/A) Day 22 :Post-Op Procedure(s) (LRB): VIDEO BRONCHOSCOPY (N/A) RIGHT VIDEO ASSISTED THORACOSCOPY /RIGHT UPPER LOBE LUNG RESECTION and Right Thoracotomy (Right) LYMPH NODE DISSECTION (Right Total Length of Stay:  LOS: 22 days   Subjective: She was disappointed she failed swallow study yesterday.  Objective: Vital signs in last 24 hours: Temp:  [97.4 F (36.3 C)-98.5 F (36.9 C)] 97.4 F (36.3 C) (02/06 1140) Pulse Rate:  [65-104] 76 (02/06 1100) Cardiac Rhythm: Normal sinus rhythm (02/06 0800) Resp:  [16-27] 27 (02/06 1100) BP: (101-141)/(48-73) 120/55 (02/06 1000) SpO2:  [93 %-100 %] 93 % (02/06 1100) Weight:  [152 lb 12.8 oz (69.3 kg)] 152 lb 12.8 oz (69.3 kg) (02/06 0400)  Filed Weights   11/18/16 0500 11/19/16 0500 11/20/16 0400  Weight: 154 lb 8.7 oz (70.1 kg) 154 lb 5.2 oz (70 kg) 152 lb 12.8 oz (69.3 kg)    Weight change: -1 lb 8.4 oz (-0.69 kg)      Intake/Output from previous day: 02/05 0701 - 02/06 0700 In: 1790.4 [I.V.:1400.4; NG/GT:240; IV Piggyback:150] Out: 4332 [Urine:1680; Stool:150; Chest Tube:715]  Intake/Output this shift: Total I/O In: 393.4 [I.V.:393.4] Out: 630 [Urine:550; Chest Tube:80]  Current Meds: Scheduled Meds: . acetylcysteine  2 mL Nebulization Q6H  . cefTAZidime (FORTAZ)  IV  1 g Intravenous Q8H  . chlorhexidine  15 mL Mouth Rinse BID  . Chlorhexidine Gluconate Cloth  6 each Topical Daily  . fluconazole (DIFLUCAN) IV  100 mg Intravenous Q24H  . free water  200 mL Per Tube Q6H  . Awa Bachicha's butt cream   Topical BID  . insulin aspart  0-15 Units Subcutaneous Q4H  . ipratropium  0.5  mg Nebulization Q6H  . levalbuterol  0.63 mg Nebulization Q6H  . magic mouthwash  5 mL Oral TID  . mouth rinse  15 mL Mouth Rinse q12n4p  . pantoprazole sodium  40 mg Per Tube Q1200  . sodium chloride flush  10-40 mL Intracatheter Q12H   Continuous Infusions: . sodium chloride Stopped (11/15/16 1500)  . amiodarone 30 mg/hr (11/20/16 0900)  . dextrose 50 mL/hr at 11/20/16 0900  . feeding supplement (VITAL AF 1.2 CAL)    . heparin 1,000 Units/hr (11/20/16 0900)   PRN Meds:.acetaminophen, fentaNYL (SUBLIMAZE) injection, levalbuterol, lip balm, potassium chloride (KCL MULTIRUN) 30 mEq in 265 mL IVPB, sodium chloride flush  General appearance: Frail, alert, appears stated age and no distress.  Heart: RRR Lungs: Mostly clear on the left and nearly inaudible on the right base Abdomen: soft, non-tender; sporadic bowel sounds Extremities: Mild LE edema Wound: Clean and dry Chest tubes: to suction, has tidling but no true air leak  Lab Results: CBC:  Recent Labs  11/18/16 0430 11/20/16 0349  WBC 13.2* 12.4*  HGB 9.1* 9.1*  HCT 28.5* 29.0*  PLT 370 363   BMET:   Recent Labs  11/19/16 0500 11/20/16 0349  NA 149* 146*  K 3.3* 3.4*  CL 114* 109  CO2 32 30  GLUCOSE 112* 144*  BUN 28* 24*  CREATININE  1.00 0.95  CALCIUM 8.2* 8.3*    CMET: Lab Results  Component Value Date   WBC 12.4 (H) 11/20/2016   HGB 9.1 (L) 11/20/2016   HCT 29.0 (L) 11/20/2016   PLT 363 11/20/2016   GLUCOSE 144 (H) 11/20/2016   TRIG 137 11/12/2016   ALT 25 11/11/2016   AST 32 11/11/2016   NA 146 (H) 11/20/2016   K 3.4 (L) 11/20/2016   CL 109 11/20/2016   CREATININE 0.95 11/20/2016   BUN 24 (H) 11/20/2016   CO2 30 11/20/2016   INR 1.04 11/11/2016      PT/INR: No results for input(s): LABPROT, INR in the last 72 hours. Radiology: Dg Chest Port 1 View  Result Date: 11/20/2016 CLINICAL DATA:  History of lung carcinoma.  Chest tube in place. EXAM: PORTABLE CHEST 1 VIEW COMPARISON:  Single-view  of the chest 11/19/2016 and 11/18/2016. FINDINGS: Feeding tube, right IJ central venous catheter and 2 right chest tubes remain in place. Aeration in the right upper lung zone is improved since yesterday's study with complete whiteout and remainder of the right chest persistent. Mild left basilar atelectasis is unchanged. No pneumothorax. IMPRESSION: Improved aeration in the right upper lung zone.  No other change. Electronically Signed   By: Inge Rise M.D.   On: 11/20/2016 07:36   Dg Abd Portable 1v  Result Date: 11/19/2016 CLINICAL DATA:  Feeding tube placement EXAM: PORTABLE ABDOMEN - 1 VIEW COMPARISON:  11/10/2016 FINDINGS: An enteric feeding tube is in place with tubing crossing the midline towards the right in the extending back to the left. The tip itself is off the field of view but based on the position of the tube, the tip is at least in the 3rd to 4th portion of duodenum. Volume loss in the right lung with right chest tube in place. Hyperinflation of the left lung. IMPRESSION: Enteric tube tip is off the field of view but is at least at the level of the third-fourth portion the duodenum. Electronically Signed   By: Lucienne Capers M.D.   On: 11/19/2016 21:35   Dg Swallowing Func-speech Pathology  Result Date: 11/19/2016 Objective Swallowing Evaluation: Type of Study: MBS-Modified Barium Swallow Study Patient Details Name: [name] MRN: [id number] Date of Birth: 1945-12-05 Today's Date: 11/19/2016 Time: SLP Start Time (ACUTE ONLY): 1201-SLP Stop Time (ACUTE ONLY): 1215 SLP Time Calculation (min) (ACUTE ONLY): 14 min Past Medical History: Past Medical History: Diagnosis Date . Arthritis  . Asthma   asthmatic bronchitis . COPD (chronic obstructive pulmonary disease) ([place])  . Dyspnea  . History of kidney stones  . Hyperlipidemia  . Hypertension  . Pneumonia  . PONV (postoperative nausea and vomiting)   none with her hip surgery . Restless leg syndrome  . 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 ABDOMINAL HYSTERECTOMY   . BONE DENSIDEXAY  06/02/2013 . CARDIAC CATHETERIZATION   . COLONOSCOPY  08/05/2015 . CYST EXCISION    from back . CYSTO   . DENTAL EXAMINATION UNDER ANESTHESIA   . DIAGNOSTIC MAMMOGRAM  07/28/2016 . EYE SURGERY Left   cataract surgery with lens implant . LYMPH NODE DISSECTION Right 10/29/2016  Procedure: LYMPH NODE DISSECTION;  Surgeon: [name], MD;  Location: [facility];  Service: Thoracic;  Laterality: Right; . TONSILLECTOMY   . TOTAL HIP ARTHROPLASTY  10/31/2012  Procedure: TOTAL HIP ARTHROPLASTY ANTERIOR APPROACH;  Surgeon: Mcarthur Rossetti, MD;  Location:  [facility];  Service: Orthopedics;  Laterality: Right;  Right Total Hip Replacement . VIDEO ASSISTED THORACOSCOPY (VATS)/WEDGE RESECTION Right 10/29/2016  Procedure: RIGHT  VIDEO ASSISTED THORACOSCOPY /RIGHT UPPER LOBE LUNG RESECTION and Right Thoracotomy;  Surgeon: [name], MD;  Location: [facility];  Service: Thoracic;  Laterality: Right; [name] N/A 10/29/2016  Procedure: VIDEO BRONCHOSCOPY;  Surgeon: [name], MD;  Location: [facility];  Service: Thoracic;  Laterality: N/A; . VIDEO BRONCHOSCOPY WITH ENDOBRONCHIAL NAVIGATION N/A 08/29/2016  Procedure: VIDEO BRONCHOSCOPY WITH ENDOBRONCHIAL NAVIGATION;  Surgeon: Collene Gobble, MD;  Location: [facility];  Service: Thoracic;  Laterality: N/A; . VIDEO BRONCHOSCOPY WITH INSERTION OF INTERBRONCHIAL VALVE (IBV) N/A 11/05/2016  Procedure: VIDEO BRONCHOSCOPY WITH INSERTION OF INTERBRONCHIAL VALVE (IBV) times three;  Surgeon: [name], MD;  Location: [facility];  Service: Thoracic;  Laterality: N/A; HPI: 71 y.o. 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 Initially was ambulatory in the ICU and out of bed without  difficulty. She later had worsening hypoxia and respiratory status prompting initiation of BiPAP support and PCCM consultation.  Course complicated by shock thought to be sepsis from PNA but no positive cultures, AF with RVR, bilateral LE DVT.  Intubated for hypoxic respiratory failure 1/26.   No Data Recorded Assessment / Plan / Recommendation CHL IP CLINICAL IMPRESSIONS 11/19/2016 Therapy Diagnosis Severe pharyngeal phase dysphagia;Moderate cervical esophageal phase dysphagia;Moderate oral phase dysphagia Clinical Impression Patient presents with a severe dysphagia with oral, pharyngeal, and esophageal components. Delayed oral transit due to generalized weakness and oral pain (buccal, labial ulcerations). Patient with delayed swallow initiation, base of tongue weakness, decreased epiglottic deflection, laryngeal closure, and pharyngeal constriction s/p prolonged intubation with severe deconditioning all result in poor clearance of bolus from pharynx with resultant penetration of all consistencies provided. Attempted chin tuck without improved function. Cued cough weak and unsuccessful to fully clear the airway. Cued dry swallow ineffective to adequately clear pharynx and prevent penetration/aspiraiton of residue. Discussed severity of deficits with patient and recommendations for continued NPO status. Patient understandably verbalizing diappointment. Would consider allowing ice chips PRN for pleasure and facilitation of hydration to mucosa and use of swallowing musculature ONLY if MD feels that patient can handle aspiration of clean ice chips from a pulmonary standpoint. Will defer this decision to MD. Have paged but no call returned at this time.  Impact on safety and function Severe aspiration risk;Risk for inadequate nutrition/hydration   CHL IP TREATMENT RECOMMENDATION 11/19/2016 Treatment Recommendations Therapy as outlined in treatment plan below   Prognosis 11/19/2016 Prognosis for Safe Diet Advancement Good  Barriers to Reach Goals Severity of deficits Barriers/Prognosis Comment -- CHL IP DIET RECOMMENDATION 11/19/2016 SLP Diet Recommendations NPO;Alternative means - temporary Liquid Administration via -- Medication Administration Via alternative means Compensations -- Postural Changes --   CHL IP OTHER RECOMMENDATIONS 11/19/2016 Recommended Consults -- Oral Care Recommendations Oral care QID Other Recommendations --   CHL IP FOLLOW UP RECOMMENDATIONS 11/19/2016 Follow up Recommendations Inpatient Rehab   CHL IP FREQUENCY AND DURATION 11/19/2016 Speech Therapy Frequency (ACUTE ONLY) min 3x week Treatment Duration 2 weeks      CHL IP ORAL PHASE 11/19/2016 Oral Phase Impaired Oral - Pudding Teaspoon -- Oral - Pudding Cup -- Oral - Honey Teaspoon Delayed oral transit Oral - Honey Cup -- Oral - Nectar Teaspoon Delayed oral transit Oral - Nectar Cup --  Oral - Nectar Straw -- Oral - Thin Teaspoon Delayed oral transit Oral - Thin Cup -- Oral - Thin Straw -- Oral - Puree Delayed oral transit Oral - Mech Soft -- Oral - Regular -- Oral - Multi-Consistency -- Oral - Pill -- Oral Phase - Comment likely due to mild oral pain, ulcerations  CHL IP PHARYNGEAL PHASE 11/19/2016 Pharyngeal Phase Impaired Pharyngeal- Pudding Teaspoon -- Pharyngeal -- Pharyngeal- Pudding Cup -- Pharyngeal -- Pharyngeal- Honey Teaspoon Delayed swallow initiation-vallecula;Pharyngeal residue - valleculae;Pharyngeal residue - pyriform;Reduced pharyngeal peristalsis;Reduced epiglottic inversion;Reduced airway/laryngeal closure;Reduced tongue base retraction;Penetration/Apiration after swallow;Pharyngeal residue - posterior pharnyx;Pharyngeal residue - cp segment Pharyngeal Material enters airway, CONTACTS cords and not ejected out Pharyngeal- Honey Cup -- Pharyngeal -- Pharyngeal- Nectar Teaspoon Delayed swallow initiation-vallecula;Pharyngeal residue - valleculae;Pharyngeal residue - pyriform;Reduced pharyngeal peristalsis;Reduced epiglottic inversion;Reduced  airway/laryngeal closure;Reduced tongue base retraction;Penetration/Apiration after swallow;Pharyngeal residue - posterior pharnyx;Pharyngeal residue - cp segment;Penetration/Aspiration during swallow Pharyngeal Material enters airway, CONTACTS cords and not ejected out Pharyngeal- Nectar Cup -- Pharyngeal -- Pharyngeal- Nectar Straw -- Pharyngeal -- Pharyngeal- Thin Teaspoon Delayed swallow initiation-vallecula;Pharyngeal residue - valleculae;Pharyngeal residue - pyriform;Reduced pharyngeal peristalsis;Reduced epiglottic inversion;Reduced airway/laryngeal closure;Reduced tongue base retraction;Penetration/Apiration after swallow;Pharyngeal residue - posterior pharnyx;Pharyngeal residue - cp segment;Delayed swallow initiation-pyriform sinuses;Penetration/Aspiration before swallow;Penetration/Aspiration during swallow Pharyngeal Material enters airway, CONTACTS cords and not ejected out;Material enters airway, passes BELOW cords without attempt by patient to eject out (silent aspiration) Pharyngeal- Thin Cup -- Pharyngeal -- Pharyngeal- Thin Straw -- Pharyngeal -- Pharyngeal- Puree Delayed swallow initiation-vallecula;Pharyngeal residue - valleculae;Pharyngeal residue - pyriform;Reduced pharyngeal peristalsis;Reduced epiglottic inversion;Reduced airway/laryngeal closure;Reduced tongue base retraction;Penetration/Apiration after swallow;Pharyngeal residue - posterior pharnyx;Pharyngeal residue - cp segment Pharyngeal Material enters airway, remains ABOVE vocal cords and not ejected out Pharyngeal- Mechanical Soft -- Pharyngeal -- Pharyngeal- Regular -- Pharyngeal -- Pharyngeal- Multi-consistency -- Pharyngeal -- Pharyngeal- Pill -- Pharyngeal -- Pharyngeal Comment --  CHL IP CERVICAL ESOPHAGEAL PHASE 11/19/2016 Cervical Esophageal Phase Impaired Pudding Teaspoon -- Pudding Cup -- Honey Teaspoon -- Honey Cup -- Nectar Teaspoon -- Nectar Cup -- Nectar Straw -- Thin Teaspoon -- Thin Cup -- Thin Straw -- Puree -- Mechanical  Soft -- Regular -- Multi-consistency -- Pill -- Cervical Esophageal Comment decreased UES relaxation Leah McCoy MA, CCC-SLP [phone] No flowsheet data found. McCoy Leah [name] 11/19/2016, 1:46 PM                Assessment/Plan:   Procedure(s) (LRB): VIDEO BRONCHOSCOPY WITH INSERTION OF INTERBRONCHIAL VALVE (IBV) times three (N/A) Day Post-Op Procedure(s) (LRB): VIDEO BRONCHOSCOPY WITH INSERTION OF INTERBRONCHIAL VALVE (IBV) times three (N/A) Post-Op Procedure(s) (LRB): VIDEO BRONCHOSCOPY (N/A) RIGHT VIDEO ASSISTED THORACOSCOPY /RIGHT UPPER LOBE LUNG RESECTION and Right Thoracotomy (Right) LYMPH NODE DISSECTION (Right    1.CV-SR in the 80's this am. On Amiodarone and Heparin drips. On Heparin drip for DVTs. 2.Pulmonary-s/p RUL 1/18 and s/p EBV valves on 01/22 for persistent air leak. Chest tubes with 715 cc of yellow pleural fluid last 24 hours She has had 80 cc of output since 7 am.  Chest tubes to suction and with no air leak.On 5 liters of oxygen via [place]. CXR shows patient is rotated to the left, some improvement in aeration RUL. Chest tubes to remain for now. Continue flutter valve and incentive spirometer. 3. GI-Swallow study results Delayed oral transit due to generalized weakness and oral pain (buccal, labial ulcerations).  Severity of deficits with patient and recommendations discussed for continued NPO status. Nutrition recommended TFs at 60 ml/hr. IVF currently at 50 ml/hr. 4.  ABL anemia-H and H yesterday 9.1 and 29 5.ID-On Fluconazole and Tressie Ellis (concern for new HCAP) 6. Hypokalemia-potassium 3.4. Given runs of potassium-will order 2 more via tube   ZIMMERMAN,DONIELLE M PA-C  No air leak currently, change to water seal  Convert to po Cordarone vie feeding tube  I have seen and examined [name] and agree with the above assessment  and plan.  [name] MD Bee[facility] [phone] Office [phone] 11/20/2016 1:29 PM    11/20/2016 11:57 AM

## 2016-11-20 NOTE — Progress Notes (Signed)
Speech Language Pathology Treatment: Dysphagia  Patient Details Name: [name] MRN: [id number] DOB: 1946/05/14 Today's Date: 11/20/2016 Time: 1520-1540 SLP Time Calculation (min) (ACUTE ONLY): 20 min  Assessment / Plan / Recommendation Clinical Impression  Pt seen for swallowing therapy. SLP facilitated safety with trials with upright positioning and oral care. Min verbal cues given for 2-3 hard, effortful swallows with each ice chip. After first ice chip pt expectorated thick brown mucous. Mechanism still appears weak and delayed cough observed. She reports she has been allowed ice chips today, but was instructed not to swallow them. SLP reinforced that effortful swallows are important for progress, and if given in minimal amounts with oral care, quantity of aspiration will be small. Provided verbal and written instruction for patient and RN. Will f/u tomorrow.    HPI HPI: 71 y.o. 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 Initially was ambulatory in the ICU and out of bed without difficulty. She later had worsening hypoxia and respiratory status prompting initiation of BiPAP support and PCCM consultation.  Course complicated by shock thought to be sepsis from PNA but no positive cultures, AF with RVR, bilateral LE DVT.  Intubated for hypoxic respiratory failure 1/26.        SLP Plan  Continue with current plan of care     Recommendations  Diet recommendations: NPO (ice chips after oral care ok) Medication Administration: Via alternative means                Oral Care Recommendations: Oral care prior to ice chip/H20 Plan: Continue with current plan of care       GO               [facility], MA CCC-SLP [phone]  Lynann Beaver 11/20/2016, 3:46 PM

## 2016-11-20 NOTE — Progress Notes (Signed)
PHARMACY NOTE:  ANTIMICROBIAL RENAL DOSAGE ADJUSTMENT  Current antimicrobial regimen includes a mismatch between antimicrobial dosage and estimated renal function.  As per policy approved by the Pharmacy & Therapeutics and Medical Executive Committees, the antimicrobial dosage will be adjusted accordingly.  Current antimicrobial dosage:  Ceftazidime 1g IV q12h  Indication: possible PNA  Renal Function:  Estimated Creatinine Clearance: 53.1 mL/min (by C-G formula based on SCr of 0.95 mg/dL).    Antimicrobial dosage has been changed to:  Ceftazidime 1g IV q8h  Additional comments:  Will continue to monitor and adjust as appropriate.   Elicia Lamp, PharmD, BCPS Clinical Pharmacist 11/20/2016 8:26 AM

## 2016-11-20 NOTE — Progress Notes (Signed)
PULMONARY / CRITICAL CARE MEDICINE   Name: [name] MRN: [id number] DOB: [birth year]    ADMISSION DATE:  10/29/2016 CONSULTATION DATE:  11/07/2016  REFERRING MD:  Lanelle Bal, M.D. / CVTS  CHIEF COMPLAINT:  Acute Hypoxic Respiratory Failure  BRIEF SUMMARY:  71 y.o. 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 Initially was ambulatory in the ICU and out of bed without difficulty. She later had worsening hypoxia and  Intubated for hypoxic respiratory failure 3/01  Course complicated by shock thought to be sepsis from PNA but no positive cultures, AF with RVR, bilateral LE DVT.   SUBJECTIVE:  Pt reports she is anxious to drink water.  Reports weakness with transfer from bed to chair, mild SOB, generalized swelling.    VITAL SIGNS: BP 125/63   Pulse 65   Temp 97.7 F (36.5 C) (Oral)   Resp (!) 22   Ht '5\' 5"'$  (1.651 m)   Wt 152 lb 12.8 oz (69.3 kg)   SpO2 100%   BMI 25.43 kg/m   HEMODYNAMICS:    VENTILATOR SETTINGS:    INTAKE / OUTPUT: I/O last 3 completed shifts: In: 2755.8 [I.V.:2315.8; NG/GT:240; IV SWFUXNATF:573] Out: 2202 [Urine:2580; Stool:150; Chest Tube:1185]  PHYSICAL EXAMINATION: General:  Chronically ill appearing female up in chair in NAD HEENT: MM pink/moist, small bore feeding tube in place PSY: normal mood /affect Neuro: AAOx4, speech clear, MAE CV: s1s2 rrr, no m/r/g, CT x2 to water seal  PULM: even/non-labored, lungs bilaterally with rhonchi, diminished on R RK:YHCW, non-tender, bsx4 active  Extremities: warm/dry, generalized edema, pitting in BLE  Skin: no rashes or lesions    LABS:  Recent Labs Lab 11/17/16 0358 11/18/16 0430 11/20/16 0349  HGB 9.4* 9.1* 9.1*  HCT 29.3* 28.5* 29.0*  WBC 16.2* 13.2* 12.4*  PLT 368 370 363    Recent Labs Lab 11/14/16 0345  11/16/16 0450  11/17/16 0358 11/18/16 0430 11/19/16 0500 11/20/16 0349  NA 142  < > 153* 152* 151* 149* 146*  K 3.2*  < > 3.3* 3.4* 3.3* 3.3* 3.4*  CL 109  < > 117* 116* 113* 114* 109  CO2 24  < > '28 30 30 '$ 32 30  GLUCOSE 124*  < > 123* 115* 133* 112* 144*  BUN 54*  < > 46* 36* 33* 28* 24*  CREATININE 1.70*  < > 1.22* 1.08* 1.03* 1.00 0.95  CALCIUM 7.9*  < > 8.5* 8.4* 8.5* 8.2* 8.3*  MG 1.8  --   --   --   --   --   --   PHOS 2.8  --   --   --   --   --   --   < > = values in this interval not displayed. No results for input(s): PHART, PCO2ART, PO2ART, HCO3, TCO2, O2SAT in the last 168 hours.  Invalid input(s): PCO2, PO2  STUDIES:  PFT 08/24/16 >> FVC 2.54 L (89%) FEV1 1.22 L (57%) FEV1/FVC 0.48 FEF 25-75 0.44 L (24%) negative bronchial dilator response TLC 5.19 L (120%) RV 155% ERV 76% DLCO uncorrected 52% TTE 1/24 >>  Poor quality study. Unable to assess RV. No pericardial effusion. LV with mild LVH but normal in size. EF 60-65%. Unable to assess wall motion. Grade 1 diastolic dysfunction. BILAT LE VENOUS DUPLEX 1/24 >>  There is evidence of acute deep vein thrombosis involving the  peroneal veins of the right and left lower extremities. 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  Changed to argatroban. 1/30 - still with air leak, weaning well 1/31 - extubated 2/03 - ABX restarted  2/05 - CXR with R volume loss with rightward shift, chest tubes in place.  Pt  reports weakness  LINES/TUBES: Right Chest Tubes 1/15 >> R IJ CLV 1/24 >> Foley 1/24 >>   ASSESSMENT / PLAN:  71 y.o. female admitted for right upper lobectomy completed on 1/15. Patient had subsequent persistent air leak. Patient's shock and acute hypoxic respiratory failure likely due to evolving sepsis from PNA (NOS). Source likely from her lung given recent surgery.  Course complicated by BPF, AF-RVR & DVT  PULMONARY A: S/P Acute Hypoxic Respiratory Failure secondary to PNA, treated.  RUL NSCLCA - s/p RULobectomy  Concern for new HCAP 2/3 abx restarted Persistent Right Pneumothorax & Air Leak post-lobectomy - Chest tubes in place & Endobronchial valves placed 1/22 Significant R lung atx and volume loss Moderate to Severe COPD P:   Chest tubes to water seal > mgmt per CVTS Pulmonary hygiene - IS, mobilize  Intermittent CXR  May need BiPAP to assist with RLL aeration  Continue ABX, D4/x  Wean O2 for sats > 90% Lasix as below, goal even balance Continue mucomyst  CARDIOVASCULAR A:  S/P Septic Shock likely secondary to PNA, resolved Afib with RVR , converted sinus 1/31 Bilateral Calf Vein/Peroneal DVT H/O HTN, HLD  P:  Continue amiodarone  Lasix 40 mg IV x1 Heparin per pharmacy   RENAL A:   Acute Renal Failure Hypernatremia  Hypokalemia P:  Continue free water Trend BMP / UOP  Replace electrolytes as indicated   GASTROINTESTINAL A:   Nausea and Vomiting - resolved R/o dysphagia P:   Continue small bore feeding tube SLP following, appreciate input  Repeat evaluation on 2/7 pending  Continue TF for now  PPI for SUP  HEMATOLOGIC A:   Anticoagulation secondary to DVT  Thrombocytopenia 31-> resolved Changed to argatroban 1/28 due to thrombocytopenia, HIT neg ELISA so back on heparin drip P: Heparin gtt per pharmacy   INFECTIOUS A:   S/P Septic Shock secondary to PNA   Leucocytosis Small amt candida in sputum  P:   Continue Ceftaz, D4/x  Cultures  as above  Treated with 4 days antifungal   ENDOCRINE A:   Hyperglycemia controlled  P:   SSI with CBG monitoring   NEUROLOGIC A:   Acute Metabolic Encephalopathy - resolved Post Operative Pain   P:   Supportive care  PT efforts Minimize sedating medications  FAMILY  - Updates:  Husband and patient updated at bedside 2/6 am.    NP CC Time: 30 minutes   Noe Gens, NP-C [facility] Pgr: [phone] or if no answer [phone] 11/20/2016, 8:54 AM   Attending Note:  I have examined patient, reviewed labs, studies and notes. I have discussed the case with B Alfredo Martinez,  and I agree with the data and plans as amended above. 71 yo woman s/p RUL lobectomy for NSCLCA, c/b air leak and then severe R atx with associated resp failure. One-way valves were placed. She has been working hard with pulm hygiene. Her CXR today may be slightly improved R aeration today - still significant atx. Will continue current pulm hygiene, hopefully chest tubes will be able to come out soon - should help. Consider FOB if we believe that this will help w secretion clearance or with cx information. Will discuss daily w Dr Campbell Stall, MD, PhD 11/20/2016, 1:31 PM [facility] [phone] or if no answer [phone]

## 2016-11-21 ENCOUNTER — Inpatient Hospital Stay (HOSPITAL_COMMUNITY): Payer: Medicare Other

## 2016-11-21 LAB — GLUCOSE, CAPILLARY
Glucose-Capillary: 113 mg/dL — ABNORMAL HIGH (ref 65–99)
Glucose-Capillary: 117 mg/dL — ABNORMAL HIGH (ref 65–99)
Glucose-Capillary: 126 mg/dL — ABNORMAL HIGH (ref 65–99)
Glucose-Capillary: 135 mg/dL — ABNORMAL HIGH (ref 65–99)
Glucose-Capillary: 136 mg/dL — ABNORMAL HIGH (ref 65–99)
Glucose-Capillary: 139 mg/dL — ABNORMAL HIGH (ref 65–99)

## 2016-11-21 LAB — BASIC METABOLIC PANEL
Anion gap: 7 (ref 5–15)
BUN: 26 mg/dL — ABNORMAL HIGH (ref 6–20)
CO2: 32 mmol/L (ref 22–32)
Calcium: 8.1 mg/dL — ABNORMAL LOW (ref 8.9–10.3)
Chloride: 106 mmol/L (ref 101–111)
Creatinine, Ser: 0.97 mg/dL (ref 0.44–1.00)
GFR calc Af Amer: >60 mL/min (ref >60)
GFR calc non Af Amer: 57 mL/min — ABNORMAL LOW (ref >60)
Glucose, Bld: 133 mg/dL — ABNORMAL HIGH (ref 65–99)
Potassium: 3.3 mmol/L — ABNORMAL LOW (ref 3.5–5.1)
Sodium: 145 mmol/L (ref 135–145)

## 2016-11-21 LAB — HEPARIN LEVEL (UNFRACTIONATED): HEPARIN UNFRACTIONATED: 0.43 [IU]/mL (ref 0.30–0.70)

## 2016-11-21 LAB — MAGNESIUM: MAGNESIUM: 1.7 mg/dL (ref 1.7–2.4)

## 2016-11-21 MED ORDER — FUROSEMIDE 10 MG/ML IJ SOLN
40.0000 mg | Freq: Once | INTRAMUSCULAR | Status: AC
  Administered 2016-11-21: 40 mg via INTRAVENOUS
  Filled 2016-11-21: qty 4

## 2016-11-21 MED ORDER — OSMOLITE 1.5 CAL PO LIQD
1000.0000 mL | ORAL | Status: DC
  Administered 2016-11-21 – 2016-11-22 (×2): 1000 mL
  Filled 2016-11-21 (×6): qty 1000

## 2016-11-21 MED ORDER — POTASSIUM CHLORIDE 20 MEQ/15ML (10%) PO SOLN
ORAL | Status: AC
  Administered 2016-11-21: 40 meq
  Filled 2016-11-21: qty 30

## 2016-11-21 MED ORDER — PRO-STAT SUGAR FREE PO LIQD
30.0000 mL | Freq: Three times a day (TID) | ORAL | Status: DC
  Administered 2016-11-21 – 2016-11-23 (×6): 30 mL
  Filled 2016-11-21 (×6): qty 30

## 2016-11-21 MED ORDER — POTASSIUM CHLORIDE 20 MEQ/15ML (10%) PO SOLN
20.0000 meq | ORAL | Status: AC
  Administered 2016-11-21 (×2): 20 meq
  Filled 2016-11-21 (×3): qty 15

## 2016-11-21 MED ORDER — POTASSIUM CHLORIDE 20 MEQ/15ML (10%) PO SOLN
40.0000 meq | Freq: Once | ORAL | Status: AC
  Administered 2016-11-21: 40 meq

## 2016-11-21 NOTE — Progress Notes (Signed)
[facility] for Heparin Indication: DVT and atrial fibrillation  Allergies  Allergen Reactions  . Nickel Rash    Patient Measurements: Height: '5\' 5"'$  (165.1 cm) Weight: 151 lb 14.4 oz (68.9 kg) IBW/kg (Calculated) : 57 Heparin Dosing Weight: 68kg  Vital Signs: Temp: 98.6 F (37 C) (02/07 0400) Temp Source: Oral (02/07 0400) BP: 117/64 (02/07 0800) Pulse Rate: 70 (02/07 0800)  Labs:  Recent Labs  11/19/16 0500  11/19/16 2000 11/20/16 0349 11/21/16 0415  HGB  --   --   --  9.1*  --   HCT  --   --   --  29.0*  --   PLT  --   --   --  363  --   HEPARINUNFRC  --   < > 0.49 0.49 0.43  CREATININE 1.00  --   --  0.95 0.97  < > = values in this interval not displayed.  Estimated Creatinine Clearance: 51.9 mL/min (by C-G formula based on SCr of 0.97 mg/dL).   Assessment: 36 yoF with dopplers positive for bilateral peroneal DVT also with AFib. Argatroban stopped 2/1 and transitioned back to heparin as HIT negative. Noted, heparin decreased to 800 units/hr on 2/4 due to bleeding around the airway. CBC stable.  Heparin level remains therapeutic (0.43) on 1000 units/h. No further bleeding reported.   Goal of Therapy:  Heparin level 0.3- 0.7 Monitor platelets by anticoagulation protocol: Yes   Plan:  -Heparin at 1000 units/hr -Daily heparin level/CBC -Monitor for s/sx bleeding  Elicia Lamp, PharmD, BCPS Clinical Pharmacist 11/21/2016 8:41 AM

## 2016-11-21 NOTE — Progress Notes (Signed)
Patient ID: [name], female   DOB: 1945-12-01, 71 y.o.   MRN: [id number] EVENING ROUNDS NOTE :     [address]       [place] [address]             [phone]                 16 Days Post-Op Procedure(s) (LRB): VIDEO BRONCHOSCOPY WITH INSERTION OF INTERBRONCHIAL VALVE (IBV) times three (N/A)  Total Length of Stay:  LOS: 23 days  BP (!) 120/54   Pulse 75   Temp 98.5 F (36.9 C) (Oral)   Resp (!) 23   Ht '5\' 5"'$  (1.651 m)   Wt 151 lb 14.4 oz (68.9 kg)   SpO2 98%   BMI 25.28 kg/m   .Intake/Output      02/06 0701 - 02/07 0700 02/07 0701 - 02/08 0700   I.V. (mL/kg) 1720.2 (25) 560 (8.1)   Other 260    NG/GT 1580 1070   IV Piggyback 250 50   Total Intake(mL/kg) 3810.2 (55.3) 1680 (24.4)   Urine (mL/kg/hr) 2375 (1.4) 1630 (2)   Emesis/NG output 50 (0)    Stool 175 (0.1) 100 (0.1)   Chest Tube 250 (0.2) 340 (0.4)   Total Output 2850 2070   Net +960.2 -390          . sodium chloride Stopped (11/15/16 1500)  . dextrose 50 mL/hr at 11/21/16 0300  . heparin 1,000 Units/hr (11/21/16 0300)     Lab Results  Component Value Date   WBC 12.4 (H) 11/20/2016   HGB 9.1 (L) 11/20/2016   HCT 29.0 (L) 11/20/2016   PLT 363 11/20/2016   GLUCOSE 133 (H) 11/21/2016   TRIG 137 11/12/2016   ALT 25 11/11/2016   AST 32 11/11/2016   NA 145 11/21/2016   K 3.3 (L) 11/21/2016   CL 106 11/21/2016   CREATININE 0.97 11/21/2016   BUN 26 (H) 11/21/2016   CO2 32 11/21/2016   INR 1.04 11/11/2016   Stable day Swallow in am   [name] MD  Beeper [phone] Office [phone] 11/21/2016 6:51 PM

## 2016-11-21 NOTE — Progress Notes (Signed)
PULMONARY / CRITICAL CARE MEDICINE   Name: [name] MRN: [id number] DOB: August 12, 1946    ADMISSION DATE:  10/29/2016 CONSULTATION DATE:  11/07/2016  REFERRING MD:  Lanelle Bal, M.D. / CVTS  CHIEF COMPLAINT:  Acute Hypoxic Respiratory Failure  BRIEF SUMMARY:  71 y.o. 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 Initially was ambulatory in the ICU and out of bed without difficulty. She later had worsening hypoxia and  Intubated for hypoxic respiratory failure 4/09  Course complicated by shock thought to be sepsis from PNA but no positive cultures, AF with RVR, bilateral LE DVT.   SUBJECTIVE:   Working with Speech Therapy. No events overnight.   VITAL SIGNS: BP 117/64 (BP Location: Left Arm)   Pulse 70   Temp 98.8 F (37.1 C) (Oral)   Resp 17   Ht '5\' 5"'$  (1.651 m)   Wt 68.9 kg (151 lb 14.4 oz)   SpO2 99%   BMI 25.28 kg/m   HEMODYNAMICS:    VENTILATOR SETTINGS:    INTAKE / OUTPUT: I/O last 3 completed shifts: In: 5580.6 [I.V.:3120.6; Other:260; NG/GT:1850; IV Piggyback:350] Out: 8119 [JYNWG:9562; Emesis/NG output:50; Stool:325; Chest Tube:740]  PHYSICAL EXAMINATION: General:  Adult female, no distress, lying in bed  HEENT: Normocephalic, small bore tube in place in right nare  Neuro: Alert, oriented, follows commands, moves all extremities  CV: RRR, no MRG, NI S1/S2, right chest tube x2 to water seal  PULM: on [place], no distress, diminished breath sounds to right side, clear breath sounds to left  ZH:YQMV, non-tender, non-distended, active bowel sounds  Extremities: warm/dry, edema to BLE  Skin: Warm, Dry, Intact     LABS:  Recent Labs Lab 11/17/16 0358 11/18/16 0430 11/20/16 0349  HGB 9.4* 9.1* 9.1*  HCT 29.3* 28.5* 29.0*  WBC 16.2* 13.2* 12.4*  PLT 368 370 363    Recent Labs Lab 11/17/16 0358  11/18/16 0430 11/19/16 0500 11/20/16 0349 11/21/16 0415  NA 152* 151* 149* 146* 145  K 3.4* 3.3* 3.3* 3.4* 3.3*  CL 116* 113* 114* 109 106  CO2 30 30 32 30 32  GLUCOSE 115* 133* 112* 144* 133*  BUN 36* 33* 28* 24* 26*  CREATININE 1.08* 1.03* 1.00 0.95 0.97  CALCIUM 8.4* 8.5* 8.2* 8.3* 8.1*   No results for input(s): PHART, PCO2ART, PO2ART, HCO3, TCO2, O2SAT in the last 168 hours.  Invalid input(s): PCO2, PO2  STUDIES:  PFT 08/24/16 >> FVC 2.54 L (89%) FEV1 1.22 L (57%) FEV1/FVC 0.48 FEF 25-75 0.44 L (24%) negative bronchial dilator response TLC 5.19 L (120%) RV 155% ERV 76% DLCO uncorrected 52% TTE 1/24 >>  Poor quality study. Unable to assess RV. No pericardial effusion. LV with mild LVH but normal in size. EF 60-65%. Unable to assess wall motion. Grade 1 diastolic dysfunction. BILAT LE VENOUS DUPLEX 1/24 >>  There is evidence of acute deep vein thrombosis involving the peroneal veins of the right and left lower extremities. 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  Changed to argatroban. 1/30 - still with air leak, weaning well 1/31 - extubated 2/03 - ABX restarted  2/05 - CXR with R volume loss with rightward shift, chest tubes in place   LINES/TUBES: Right Chest Tubes 1/15 >> R IJ CLV 1/24 >> Foley 1/24 >>   ASSESSMENT / PLAN:  71 y.o. female admitted for right upper lobectomy completed on 1/15. Patient had subsequent persistent  air leak. Patient's shock and acute hypoxic respiratory failure likely due to evolving sepsis from PNA (NOS). Source likely from her lung given recent surgery.  Course complicated by BPF, AF-RVR & DVT  PULMONARY A: S/P Acute Hypoxic Respiratory Failure secondary to PNA, treated.  RUL NSCLCA - s/p RULobectomy  Concern for new HCAP 2/3 abx restarted Persistent Right Pneumothorax & Air Leak post-lobectomy - Chest tubes in place & Endobronchial valves placed 1/22 Significant R lung atx and volume loss Moderate to Severe COPD P:   Chest tubes to water seal > mgmt per CVTS Continue Atrovent q6h and xopenex q6h Pulmonary hygiene - IS, mobilize  CXR in AM   Maintain Saturation > 90%, BIPAP PRN  Lasix as below, goal even balance  CARDIOVASCULAR A:  S/P Septic Shock likely secondary to PNA, resolved Afib with RVR , converted sinus 1/31 Bilateral Calf Vein/Peroneal DVT H/O HTN, HLD  P:  Continue amiodarone 200 mg BID  Lasix 40 mg IV x1 Heparin per pharmacy   RENAL A:   Acute Renal Failure Hypernatremia - down-trending  Hypokalemia P:  Continue free water Continue d5 @ 43m/hr  Trend BMP / UOP  Replace electrolytes as indicated   GASTROINTESTINAL A:   Nausea and Vomiting - resolved R/o dysphagia P:   Continue TF per nutrition  SLP following - Repeat evaluation today  PPI for SUP  HEMATOLOGIC A:   Bilateral peroneal DVT Thrombocytopenia > resolved - argatroban 1/28 due to thrombocytopenia, HIT neg ELISA, back on heparin drip P: Heparin gtt per pharmacy  Monitor for signs and symptoms of bleeding  Trend CBC and HPTT per Pharmacy   INFECTIOUS A:   S/P Septic Shock secondary to PNA   Leucocytosis Small amt candida in sputum  - S/P 4 days antifungal  P:   Continue Ceftaz, Day 5   Trend Fever and WBC   ENDOCRINE A:   Hyperglycemia controlled  P:   SSI  Q4H Glucose Checks  NEUROLOGIC A:   Acute Metabolic Encephalopathy - resolved Post Operative Pain   P:    PT/OT/ST  Fentanyl PRN  Minimize sedating medications  FAMILY  - Updates:  Husband and patient updated at bedside 2/7  NP CC Time: 32 minutes   KHayden Pedro AG-ACNP [facility]  Pgr: [phone] PCCM Pgr: [phone] Attending Note:  I have examined patient, reviewed labs, studies and notes. I have discussed the case with KBeaulah Corin and I agree with the data and plans as amended above.  71year old woman status post right upper lobe lobectomy for non-small cell lung cancer. She had persistent air leak with endobronchial valve placement. She has continued to have significant right lower lobe atelectasis with difficulty clearing secretions. She was restarted on antibiotics for possible HCAP. She unfortunately did not pass her last swallowing evaluation, is said to be evaluated by speech therapy again today. We will continue to follow, continue to  push primary hygiene. I don't believe she needs bronchoscopy at this time.   35 minutes   Baltazar Apo, MD, PhD 11/21/2016, 10:27 AM [facility] [phone] or if no answer [phone]

## 2016-11-21 NOTE — Progress Notes (Signed)
[facility] ICU Electrolyte Replacement Protocol  Patient Name: [name] DOB: 03-02-1946 MRN: [id number]  Date of Service  11/21/2016   HPI/Events of Note    Recent Labs Lab 11/17/16 0358 11/18/16 0430 11/19/16 0500 11/20/16 0349 11/21/16 0415  NA 152* 151* 149* 146* 145  K 3.4* 3.3* 3.3* 3.4* 3.3*  CL 116* 113* 114* 109 106  CO2 30 30 32 30 32  GLUCOSE 115* 133* 112* 144* 133*  BUN 36* 33* 28* 24* 26*  CREATININE 1.08* 1.03* 1.00 0.95 0.97  CALCIUM 8.4* 8.5* 8.2* 8.3* 8.1*    Estimated Creatinine Clearance: 51.9 mL/min (by C-G formula based on SCr of 0.97 mg/dL).  Intake/Output      02/06 0701 - 02/07 0700   I.V. (mL/kg) 1540.2 (22.4)   Other 260   NG/GT 1400   IV Piggyback 200   Total Intake(mL/kg) 3400.2 (49.3)   Urine (mL/kg/hr) 2275 (1.4)   Emesis/NG output 50 (0)   Stool 175 (0.1)   Chest Tube 190 (0.1)   Total Output 2690   Net +710.2        - I/O DETAILED x24h    Total I/O In: 1640 [I.V.:540; Other:260; NG/GT:740; IV Piggyback:100] Out: 810 [Urine:525; Stool:175; Chest Tube:110] - I/O THIS SHIFT    ASSESSMENT   eICURN Interventions  Electrolytes replaced using ICU protocol   ASSESSMENT: [name], [name] 11/21/2016, 6:51 AM

## 2016-11-21 NOTE — Progress Notes (Signed)
Speech Language Pathology Treatment: Dysphagia  Patient Details Name: [name] MRN: [id number] DOB: [birth year] Today's Date: 11/21/2016 Time: 0569-7948 SLP Time Calculation (min) (ACUTE ONLY): 20 min  Assessment / Plan / Recommendation Clinical Impression  Treatment focused on readiness for repeat instrumental testing, progress with swallowing function. Patient alert and cooperative. Vocal quality improving, now only minimally hoarse and 100% audible. Oral cavity appearing much improved with no dried blood/secretions along lips or inside of mouth, patient with continued c/o oral pain however impacting oral transit of bolus.Po trials administered including thin liquids, nectar thick liquids, and pureed solids. Patient without overt s/s of aspiration today with SLP providing min-moderate verbal cues for use of multiple swallows to clear suspected pharyngeal residue based on most recent MBS. Encouraged use of effortful swallow in attempts to also better clear the pharynx while facilitating improved strength of swallowing musculature. Discussed progress and plan with patient and family. Based on severity of dysphagia 2/5, would not expect to see enough improvement today to initiate a po diet safely however clinically, patient appearing much improved and appearing to better protect her airway. Radiology unable however to complete MBS this date. Will plan for MBS in am 2/8. Patient and family in agreement.    HPI HPI: 71 y.o. 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 Initially was ambulatory in the ICU and out of bed without difficulty. She later had worsening hypoxia and respiratory status prompting initiation of BiPAP support and PCCM consultation.  Course complicated by shock thought to be sepsis from PNA but no positive cultures, AF with RVR,  bilateral LE DVT.  Intubated for hypoxic respiratory failure 1/26.        SLP Plan  MBS (2/8)     Recommendations  Diet recommendations: NPO (except ice chips after oral care) Medication Administration: Via alternative means                Oral Care Recommendations: Oral care prior to ice chip/H20;Oral care QID Plan: MBS (2/8)       [place], CCC-SLP [phone]    Vianna Venezia [name] 11/21/2016, 11:21 AM

## 2016-11-21 NOTE — Progress Notes (Signed)
11/21/2016 1130 Noted mild level 1 air leak with anterior CT. Dr. Servando Snare notified. Orders received to leave anterior CT in place.  Bert Ptacek, Arville Lime

## 2016-11-21 NOTE — Progress Notes (Addendum)
T[facility] DAILY ICU PROGRESS NOTE                   [address]            [place] [address]          [phone]   16 Days Post-Op Procedure(s) (LRB): VIDEO BRONCHOSCOPY WITH INSERTION OF INTERBRONCHIAL VALVE (IBV) times three (N/A) Procedure(s) (LRB): VIDEO BRONCHOSCOPY WITH INSERTION OF INTERBRONCHIAL VALVE (IBV) times three (N/A) Day 22 :Post-Op Procedure(s) (LRB): VIDEO BRONCHOSCOPY (N/A) RIGHT VIDEO ASSISTED THORACOSCOPY /RIGHT UPPER LOBE LUNG RESECTION and Right Thoracotomy (Right) LYMPH NODE DISSECTION (Right Total Length of Stay:  LOS: 23 days   Subjective: She is just waking up this am. She states her breathing is ok  Objective: Vital signs in last 24 hours: Temp:  [97.4 F (36.3 C)-98.6 F (37 C)] 98.6 F (37 C) (02/07 0400) Pulse Rate:  [66-83] 70 (02/07 0800) Cardiac Rhythm: Normal sinus rhythm (02/07 0800) Resp:  [17-27] 17 (02/07 0800) BP: (95-131)/(49-76) 117/64 (02/07 0800) SpO2:  [93 %-100 %] 99 % (02/07 0800) Weight:  [151 lb 14.4 oz (68.9 kg)] 151 lb 14.4 oz (68.9 kg) (02/07 0600)  Filed Weights   11/19/16 0500 11/20/16 0400 11/21/16 0600  Weight: 154 lb 5.2 oz (70 kg) 152 lb 12.8 oz (69.3 kg) 151 lb 14.4 oz (68.9 kg)    Weight change: -14.5 oz (-0.41 kg)      Intake/Output from previous day: 02/06 0701 - 02/07 0700 In: 3810.2 [I.V.:1720.2; NG/GT:1580; IV Piggyback:250] Out: 2850 [Urine:2375; Emesis/NG output:50; Stool:175; Chest Tube:250]  Intake/Output this shift: Total I/O In: 200 [I.V.:60; NG/GT:140] Out: 75 [Urine:75]  Current Meds: Scheduled Meds: . amiodarone  200 mg Per Tube BID  . cefTAZidime (FORTAZ)  IV  1 g Intravenous Q8H  . chlorhexidine  15 mL Mouth Rinse BID  . Chlorhexidine Gluconate Cloth  6 each Topical Daily  . free water  200 mL Per Tube Q6H  . Axil Copeman's butt cream   Topical BID  . insulin aspart  0-15 Units Subcutaneous Q4H  . ipratropium  0.5 mg Nebulization Q6H  . levalbuterol  0.63 mg  Nebulization Q6H  . magic mouthwash  5 mL Oral TID  . mouth rinse  15 mL Mouth Rinse q12n4p  . pantoprazole sodium  40 mg Per Tube Q1200  . potassium chloride  20 mEq Per Tube Q4H  . sodium chloride flush  10-40 mL Intracatheter Q12H   Continuous Infusions: . sodium chloride Stopped (11/15/16 1500)  . dextrose 50 mL/hr at 11/21/16 0300  . feeding supplement (VITAL AF 1.2 CAL) 1,000 mL (11/21/16 0300)  . heparin 1,000 Units/hr (11/21/16 0300)   PRN Meds:.acetaminophen, fentaNYL (SUBLIMAZE) injection, levalbuterol, lip balm, potassium chloride (KCL MULTIRUN) 30 mEq in 265 mL IVPB, sodium chloride flush  General appearance: Frail, alert, appears stated age and no distress.  Heart: RRR Lungs: Mostly clear on the left and nearly inaudible on the right base Abdomen: soft, non-tender; sporadic bowel sounds Extremities: Mild LE edema Wound: Clean and dry Chest tubes: to suction and no air leak  Lab Results: CBC:  Recent Labs  11/20/16 0349  WBC 12.4*  HGB 9.1*  HCT 29.0*  PLT 363   BMET:   Recent Labs  11/20/16 0349 11/21/16 0415  NA 146* 145  K 3.4* 3.3*  CL 109 106  CO2 30 32  GLUCOSE 144* 133*  BUN 24* 26*  CREATININE 0.95 0.97  CALCIUM 8.3* 8.1*  CMET: Lab Results  Component Value Date   WBC 12.4 (H) 11/20/2016   HGB 9.1 (L) 11/20/2016   HCT 29.0 (L) 11/20/2016   PLT 363 11/20/2016   GLUCOSE 133 (H) 11/21/2016   TRIG 137 11/12/2016   ALT 25 11/11/2016   AST 32 11/11/2016   NA 145 11/21/2016   K 3.3 (L) 11/21/2016   CL 106 11/21/2016   CREATININE 0.97 11/21/2016   BUN 26 (H) 11/21/2016   CO2 32 11/21/2016   INR 1.04 11/11/2016      PT/INR: No results for input(s): LABPROT, INR in the last 72 hours. Radiology: No results found.   Assessment/Plan:   Procedure(s) (LRB): VIDEO BRONCHOSCOPY WITH INSERTION OF INTERBRONCHIAL VALVE (IBV) times three (N/A) Day Post-Op Procedure(s) (LRB): VIDEO BRONCHOSCOPY WITH INSERTION OF INTERBRONCHIAL VALVE  (IBV) times three (N/A) Post-Op Procedure(s) (LRB): VIDEO BRONCHOSCOPY (N/A) RIGHT VIDEO ASSISTED THORACOSCOPY /RIGHT UPPER LOBE LUNG RESECTION and Right Thoracotomy (Right) LYMPH NODE DISSECTION (Right    1.CV-SR in the 80's this am. On Amiodarone 200 mg bid per tube. On Heparin drip for DVTs. 2.Pulmonary-s/p RUL 1/18 and s/p EBV valves on 01/22 for persistent air leak. Chest tubes with 250 cc of yellow pleural fluid last 24 hours .   Chest tubes to suction and with no air leak.On 6 liters of oxygen via [place]. CXR appears relatively stable. Hope to remove one tube soon. Continue flutter valve and incentive spirometer. 3. GI-Swallow study results Delayed oral transit due to generalized weakness and oral pain (buccal, labial ulcerations).  Severity of deficits with patient and recommendations discussed for continued NPO status. Nutrition recommended TFs at 60 ml/hr. IVF currently at 51 ml/hr-may be able to decrease IVF soon. 4. ABL anemia-Last H and H yesterday 9.1 and 29 5.ID-On Fluconazole and Tressie Ellis (concern for new HCAP) 6. Hypokalemia-potassium still 3.3 despite runs. Will check Magnesium   ZIMMERMAN,DONIELLE M PA-C 11/21/2016 8:16 AM   Still weak and respiratory status tenuous but slowly improving No air leak , will d/c anterior chest tube I have seen and examined [name] and agree with the above assessment  and plan.  [name] MD [facility] [phone] Office [phone] 11/21/2016 9:44 AM

## 2016-11-21 NOTE — Progress Notes (Signed)
Nutrition Brief Note  Spoke with Colletta Maryland, Therapist, sports.  Reports pt is having some discomfort and a "fullness" feeling with current TF regimen (Vital AF 1.2 at 60 ml/hr).  Will change to Osmolite 1.5 formula at 40 ml/hr with Prostat liquid protein 30 ml TID.  Provides 1740 kcals, 105 gm protein, 731 ml of free water.  Estimated Nutritional Needs:   Kcal:  6629-4765  Protein:  105-115 gm  Fluid:  1.7-1.9 L  Arthur Holms, RD, LDN Pager #: [phone] After-Hours Pager #: [phone]

## 2016-11-22 ENCOUNTER — Inpatient Hospital Stay (HOSPITAL_COMMUNITY): Payer: Medicare Other

## 2016-11-22 ENCOUNTER — Ambulatory Visit: Payer: Medicare Other | Admitting: Cardiothoracic Surgery

## 2016-11-22 LAB — CBC
HEMATOCRIT: 26.3 % — AB (ref 36.0–46.0)
HEMOGLOBIN: 8.3 g/dL — AB (ref 12.0–15.0)
MCH: 30.1 pg (ref 26.0–34.0)
MCHC: 31.6 g/dL (ref 30.0–36.0)
MCV: 95.3 fL (ref 78.0–100.0)
Platelets: 342 10*3/uL (ref 150–400)
RBC: 2.76 MIL/uL — ABNORMAL LOW (ref 3.87–5.11)
RDW: 17.6 % — ABNORMAL HIGH (ref 11.5–15.5)
WBC: 13.4 10*3/uL — AB (ref 4.0–10.5)

## 2016-11-22 LAB — GLUCOSE, CAPILLARY
Glucose-Capillary: 109 mg/dL — ABNORMAL HIGH (ref 65–99)
Glucose-Capillary: 123 mg/dL — ABNORMAL HIGH (ref 65–99)
Glucose-Capillary: 125 mg/dL — ABNORMAL HIGH (ref 65–99)
Glucose-Capillary: 136 mg/dL — ABNORMAL HIGH (ref 65–99)
Glucose-Capillary: 142 mg/dL — ABNORMAL HIGH (ref 65–99)
Glucose-Capillary: 146 mg/dL — ABNORMAL HIGH (ref 65–99)
Glucose-Capillary: 146 mg/dL — ABNORMAL HIGH (ref 65–99)

## 2016-11-22 LAB — BASIC METABOLIC PANEL
ANION GAP: 5 (ref 5–15)
BUN: 26 mg/dL — ABNORMAL HIGH (ref 6–20)
CHLORIDE: 102 mmol/L (ref 101–111)
CO2: 34 mmol/L — ABNORMAL HIGH (ref 22–32)
Calcium: 7.9 mg/dL — ABNORMAL LOW (ref 8.9–10.3)
Creatinine, Ser: 0.83 mg/dL (ref 0.44–1.00)
Glucose, Bld: 136 mg/dL — ABNORMAL HIGH (ref 65–99)
POTASSIUM: 3.3 mmol/L — AB (ref 3.5–5.1)
SODIUM: 141 mmol/L (ref 135–145)

## 2016-11-22 LAB — BLOOD GAS, ARTERIAL
Acid-Base Excess: 9.9 mmol/L — ABNORMAL HIGH (ref 0.0–2.0)
Bicarbonate: 35.1 mmol/L — ABNORMAL HIGH (ref 20.0–28.0)
Drawn by: 313941
O2 Content: 14 L/min
O2 Saturation: 90.4 %
Patient temperature: 98.7
pCO2 arterial: 59.3 mmHg — ABNORMAL HIGH (ref 32.0–48.0)
pH, Arterial: 7.389 (ref 7.350–7.450)
pO2, Arterial: 60.9 mmHg — ABNORMAL LOW (ref 83.0–108.0)

## 2016-11-22 LAB — MAGNESIUM: MAGNESIUM: 1.5 mg/dL — AB (ref 1.7–2.4)

## 2016-11-22 LAB — HEPARIN LEVEL (UNFRACTIONATED): HEPARIN UNFRACTIONATED: 0.34 [IU]/mL (ref 0.30–0.70)

## 2016-11-22 LAB — PHOSPHORUS: Phosphorus: 1.8 mg/dL — ABNORMAL LOW (ref 2.5–4.6)

## 2016-11-22 MED ORDER — SALINE SPRAY 0.65 % NA SOLN
1.0000 | NASAL | Status: DC | PRN
  Administered 2016-11-22: 1 via NASAL
  Filled 2016-11-22: qty 44

## 2016-11-22 MED ORDER — FUROSEMIDE 10 MG/ML IJ SOLN
40.0000 mg | Freq: Once | INTRAMUSCULAR | Status: AC
  Administered 2016-11-22: 40 mg via INTRAVENOUS

## 2016-11-22 MED ORDER — POTASSIUM CHLORIDE CRYS ER 20 MEQ PO TBCR: 20.0000 meq | EXTENDED_RELEASE_TABLET | Freq: Four times a day (QID) | ORAL | Status: AC

## 2016-11-22 MED ORDER — POTASSIUM CHLORIDE CRYS ER 20 MEQ PO TBCR: 20.0000 meq | EXTENDED_RELEASE_TABLET | Freq: Four times a day (QID) | ORAL | Status: DC

## 2016-11-22 MED ORDER — ACETYLCYSTEINE 20 % IN SOLN
2.0000 mL | Freq: Three times a day (TID) | RESPIRATORY_TRACT | Status: DC
  Administered 2016-11-22 – 2016-11-23 (×4): 2 mL via RESPIRATORY_TRACT
  Filled 2016-11-22 (×4): qty 4

## 2016-11-22 MED ORDER — POTASSIUM CHLORIDE 20 MEQ/15ML (10%) PO SOLN
20.0000 meq | Freq: Four times a day (QID) | ORAL | Status: AC
  Administered 2016-11-22 (×2): 20 meq
  Filled 2016-11-22 (×2): qty 15

## 2016-11-22 MED ORDER — FUROSEMIDE 10 MG/ML IJ SOLN
INTRAMUSCULAR | Status: AC
  Filled 2016-11-22: qty 4

## 2016-11-22 MED ORDER — POTASSIUM CHLORIDE 2 MEQ/ML IV SOLN
30.0000 meq | Freq: Once | INTRAVENOUS | Status: AC
  Administered 2016-11-22: 30 meq via INTRAVENOUS
  Filled 2016-11-22: qty 15

## 2016-11-22 MED ORDER — FUROSEMIDE 10 MG/ML IJ SOLN
40.0000 mg | Freq: Once | INTRAMUSCULAR | Status: AC
  Administered 2016-11-22: 40 mg via INTRAVENOUS
  Filled 2016-11-22: qty 4

## 2016-11-22 MED ORDER — POTASSIUM CHLORIDE 20 MEQ/15ML (10%) PO SOLN: 20.0000 meq | Freq: Four times a day (QID) | ORAL | Status: DC

## 2016-11-22 MED ORDER — ACETYLCYSTEINE 10 % IN SOLN
2.0000 mL | Freq: Three times a day (TID) | RESPIRATORY_TRACT | Status: DC
  Filled 2016-11-22 (×2): qty 2

## 2016-11-22 NOTE — Progress Notes (Deleted)
11/22/2016 1137 Pt. Back in NSR, bradycardic HR 50, BP 89/50. Placed back on AAI at 80 for BP support. Will continue to closely monitor patient.  Pallas Wahlert, Arville Lime

## 2016-11-22 NOTE — Progress Notes (Signed)
Speech Language Pathology Treatment: Dysphagia  Patient Details Name: [name] MRN: [id number] DOB: 05-24-1946 Today's Date: 11/22/2016 Time: 3967-2897 SLP Time Calculation (min) (ACUTE ONLY): 23 min  Assessment / Plan / Recommendation Clinical Impression  Planned for MBS this am to assess ability to advance to a po diet however patient with increase need for O2 this am, holding MBS per MD, SLP in agreement. Patient alert but with evidence of increase fatigue today, secretions again coating lips and teeth. SLP provided assistance with thorough oral care. Small amount of ice chips provided without overt indication of aspiration. Min verbal cueing provided for use of effortful swallow to maximize airway protection, pharyngeal clearance, and strength of swallowing musculature. Patient declined further ice chips after 2 boluses, lethargic. Will continue to f/u. Hopeful for ability to complete MBS next date if respiratory status improves.   HPI HPI: 71 y.o. 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 Initially was ambulatory in the ICU and out of bed without difficulty. She later had worsening hypoxia and respiratory status prompting initiation of BiPAP support and PCCM consultation.  Course complicated by shock thought to be sepsis from PNA but no positive cultures, AF with RVR, bilateral LE DVT.  Intubated for hypoxic respiratory failure 1/26.        SLP Plan  MBS (possibly 2/9 if improved)     Recommendations  Diet recommendations: NPO (except ice chips (limite 3-5) after oral care) Medication Administration: Via alternative means                Oral Care Recommendations: Oral care prior to ice chip/H20;Oral care QID Plan: MBS (possibly 2/9 if improved)       GO             Gabriel Rainwater MA,  CCC-SLP [phone]    [name] 11/22/2016, 9:23 AM

## 2016-11-22 NOTE — Progress Notes (Signed)
[facility] NOTE   Pharmacy Consult for:  Heparin Indication: DVT and atrial fibrillation  Allergies  Allergen Reactions  . Nickel Rash    Patient Measurements: Height: '5\' 5"'$  (165.1 cm) Weight: 151 lb 14.4 oz (68.9 kg) IBW/kg (Calculated) : 57 Heparin Dosing Weight: 69 kg  Vital Signs: Temp: 98.7 F (37.1 C) (02/08 1211) Temp Source: Oral (02/08 1211) BP: 133/69 (02/08 1200) Pulse Rate: 93 (02/08 1346)  Labs:  Recent Labs  11/20/16 0349 11/21/16 0415 11/22/16 0400  HGB 9.1*  --  8.3*  HCT 29.0*  --  26.3*  PLT 363  --  342  HEPARINUNFRC 0.49 0.43 0.34  CREATININE 0.95 0.97 0.83    Estimated Creatinine Clearance: 60.7 mL/min (by C-G formula based on SCr of 0.83 mg/dL).   Assessment: 38 yoF with dopplers positive for bilateral peroneal DVT also with AFib. Argatroban stopped 2/1 and transitioned back to heparin as HIT negative. Noted, heparin decreased to 800 units/hr on 2/4 due to bleeding around the airway, which resolved. Back up to 1000 units/hr on 2/5. No  Heparin level remains low therapeutic (0.34) on 1000 units/hour, though level trended down from 0.43-0.49 over the last 3 days on same rate . Hgb trended down to 8.3. No bleeding reported.   Goal of Therapy:  Heparin level 0.3- 0.7 Monitor platelets by anticoagulation protocol: Yes   Plan:   Continue Heparin drip at 1000 units/hour.  Daily heparin level and CBC, next in am.  Monitor for s/sx bleeding.  Arty Baumgartner, [place] Pager: [phone] 11/22/2016 1:47 PM

## 2016-11-22 NOTE — Progress Notes (Signed)
11/22/2016 0830 Flexiseal d/c per verbal order Dr. Servando Snare and per patient request. Will continue to closely monitor patient.  Imari Sivertsen, Arville Lime

## 2016-11-22 NOTE — Progress Notes (Signed)
PULMONARY / CRITICAL CARE MEDICINE   Name: [name] MRN: [id number] DOB: January 21, 1946    ADMISSION DATE:  10/29/2016 CONSULTATION DATE:  11/07/2016  REFERRING MD:  Lanelle Bal, M.D. / CVTS  CHIEF COMPLAINT:  Acute Hypoxic Respiratory Failure  BRIEF SUMMARY:  71 y.o. 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 Initially was ambulatory in the ICU and out of bed without difficulty. She later had worsening hypoxia and  Intubated for hypoxic respiratory failure 5/46  Course complicated by shock thought to be sepsis from PNA but no positive cultures, AF with RVR, bilateral LE DVT.   SUBJECTIVE:   Overall she believes she feels better She did have an acute desaturation event as she moved from bed to chair this am > now on higher flow o2  VITAL SIGNS: BP (!) 124/58 (BP Location: Left Arm)   Pulse 93   Temp 98.2 F (36.8 C) (Oral)   Resp (!) 22   Ht '5\' 5"'$  (1.651 m)   Wt 68.9 kg (151 lb 14.4 oz)   SpO2 93%   BMI 25.28 kg/m   HEMODYNAMICS:    VENTILATOR SETTINGS:    INTAKE / OUTPUT: I/O last 3 completed shifts: In: 101 [I.V.:2060; Other:300; NG/GT:3010; IV Piggyback:300] Out: 5681 [Urine:3040; Stool:525; Chest Tube:640]  PHYSICAL EXAMINATION: General:  Awake and alert, up to chair HEENT: OP clear, NGT inplace Neuro: Awake and alert, globally weak, non-focal CV: regular, no M, R chest tubes in place PULM: very decreased on R, no wheeze on the left GI: soft, benign, + BS Extremities: bilateral 1+ edema Skin: no rash    LABS:  Recent Labs Lab 11/18/16 0430 11/20/16 0349 11/22/16 0400  HGB 9.1* 9.1* 8.3*  HCT 28.5* 29.0* 26.3*  WBC 13.2* 12.4* 13.4*  PLT 370 363 342    Recent Labs Lab 11/18/16 0430 11/19/16 0500 11/20/16 0349 11/21/16 0415 11/22/16 0400  NA 151* 149* 146* 145 141  K 3.3* 3.3*  3.4* 3.3* 3.3*  CL 113* 114* 109 106 102  CO2 30 32 30 32 34*  GLUCOSE 133* 112* 144* 133* 136*  BUN 33* 28* 24* 26* 26*  CREATININE 1.03* 1.00 0.95 0.97 0.83  CALCIUM 8.5* 8.2* 8.3* 8.1* 7.9*  MG  --   --   --  1.7 1.5*  PHOS  --   --   --   --  1.8*   No results for input(s): PHART, PCO2ART, PO2ART, HCO3, TCO2, O2SAT in the last 168 hours.  Invalid input(s): PCO2, PO2  STUDIES:  PFT 08/24/16 >> FVC 2.54 L (89%) FEV1 1.22 L (57%) FEV1/FVC 0.48 FEF 25-75 0.44 L (24%) negative bronchial dilator response TLC 5.19 L (120%) RV 155% ERV 76% DLCO uncorrected 52% TTE 1/24 >>  Poor quality study. Unable to assess RV. No pericardial effusion. LV with mild LVH but normal in size. EF 60-65%. Unable to assess wall motion. Grade 1 diastolic dysfunction. BILAT LE VENOUS DUPLEX 1/24 >>  There is evidence of acute deep vein thrombosis involving the peroneal veins of the right and left lower extremities. 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  Changed to argatroban. 1/30 - still with air leak, weaning well 1/31 - extubated 2/03 - ABX restarted  2/05 - CXR with R volume loss with rightward shift, chest tubes in place   LINES/TUBES: Right Chest Tubes 1/15 >> R IJ CLV 1/24 >> Foley 1/24 >>   ASSESSMENT / PLAN:  71 y.o. female admitted for right upper lobectomy completed on 1/15. Patient had subsequent persistent air leak. Patient's shock and acute hypoxic  respiratory failure likely due to evolving sepsis from PNA (NOS). Source likely from her lung given recent surgery.  Course complicated by BPF, AF-RVR & DVT  PULMONARY A: S/P Acute Hypoxic Respiratory Failure secondary to PNA, treated.  RUL NSCLCA - s/p RULobectomy  Concern for new HCAP 2/3 abx restarted Persistent Right Pneumothorax & Air Leak post-lobectomy - Chest tubes in place & Endobronchial valves placed 1/22 Significant R lung atx and volume loss Moderate to Severe COPD P:   Chest tubes to water seal > mgmt per CVTS< possibly remove one on 2/8 BD's as ordered  Continue aggressive pulm hygiene Follow CXR Wean high flow O2 as able Agree with decrease diuresis on 2/8 given mild metabolic acidosis  CARDIOVASCULAR A:  S/P Septic Shock likely secondary to PNA, resolved Afib with RVR , converted sinus 1/31 Bilateral Calf Vein/Peroneal DVT H/O HTN, HLD  P:  Amiodarone '200mg'$  bid Heparin as ordered  RENAL A:   Acute Renal Failure, resolved Hypernatremia - down-trending  Hypokalemia P:  Free water Continue d5 @ 57m/hr  Agree with decreasing diuresis for now as metabolic alk may affect her ventilation Follow BMP  GASTROINTESTINAL A:   Nausea and Vomiting - resolved R/o dysphagia P:   Continue TF SLP evaluating for possible PO intake SUP > PPI  HEMATOLOGIC A:   Bilateral peroneal DVT Thrombocytopenia > resolved - argatroban 1/28 due to thrombocytopenia, HIT neg ELISA, back on heparin drip P: Heparin gtt Monitor for signs and symptoms of bleeding  Follow CBC  INFECTIOUS A:   S/P Septic Shock secondary to PNA   Leucocytosis Small amt candida in sputum  - S/P 4 days antifungal  P:   Continue Ceftaz, Day 6 > would commit to 8 days total  Follow CBC, temp curve.   ENDOCRINE A:   Hyperglycemia controlled  P:   Follow CBG, use SSI  NEUROLOGIC A:   Acute Metabolic Encephalopathy - resolved Post Operative Pain   P:   PT, OT, SLP Try to minimize  sedating meds as able  FAMILY  - Updates:  Husband and patient updated at bedside 2/7  Time spent 25 minutes  RBaltazar Apo MD, PhD 11/22/2016, 9:26 AM [facility] [phone] if no answer [phone]

## 2016-11-22 NOTE — Progress Notes (Signed)
   11/19/16 1400  SLP Visit Information  SLP Received On 11/19/16  General Information  Behavior/Cognition Alert;Cooperative  Patient Positioning Upright in bed  HPI 71 y.o. 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 Initially was ambulatory in the ICU and out of bed without difficulty. She later had worsening hypoxia and respiratory status prompting initiation of BiPAP support and PCCM consultation.  Course complicated by shock thought to be sepsis from PNA but no positive cultures, AF with RVR, bilateral LE DVT.  Intubated for hypoxic respiratory failure 1/26.    Temperature Spikes Noted No  Respiratory Status Supplemental O2 delivered via (comment) (nasal cannula)  Oral Cavity - Dentition Adequate natural dentition  Patient observed directly with PO's Yes  Treatment Provided  Treatment provided Dysphagia  Dysphagia Treatment  Treatment Methods Patient/caregiver education  Family/Caregiver Educated son, and spouse  Pain Assessment  Pain Assessment No/denies pain  SLP - End of Session  Patient left with call bell/phone within reach;in bed;with family/visitor present  Assessment / Recommendations / Plan  Plan Goals updated  Dysphagia Recommendations  Diet recommendations NPO  Medication Administration Via alternative means  General Recommendations  Oral Care Recommendations Oral care QID  Follow up Recommendations Inpatient Rehab  Progression Toward Goals  Progression toward goals Progressing toward goals   Late note entered for treatment complete 11/19/16  Patient and family seen for education following MBS. Family educated regarding results of MBS, prognosis, and plan, including recommendations for NPO including ice chips at this time given degree of aspiration noted on study and fragile nature of respiratory function. All  verbalized understanding. Will continue to f/u.   [place], [place] [phone]

## 2016-11-22 NOTE — Progress Notes (Signed)
Patient ID: CHEILA WICKSTROM, female   DOB: 07-08-1946, 71 y.o.   MRN: [id number] T[facility] DAILY ICU PROGRESS NOTE                   [address]            ,[place] [address]          [phone]   17 Days Post-Op Procedure(s) (LRB): VIDEO BRONCHOSCOPY WITH INSERTION OF INTERBRONCHIAL VALVE (IBV) times three (N/A)  Total Length of Stay:  LOS: 24 days   Subjective: Feels ok, but increased need for o2 this am   Objective: Vital signs in last 24 hours: Temp:  [97.6 F (36.4 C)-98.8 F (37.1 C)] 98 F (36.7 C) (02/08 0400) Pulse Rate:  [70-96] 96 (02/08 0700) Cardiac Rhythm: (P) Normal sinus rhythm (02/08 0730) Resp:  [16-30] 30 (02/08 0700) BP: (98-160)/(49-85) 160/80 (02/08 0700) SpO2:  [90 %-100 %] 94 % (02/08 0734)  Filed Weights   11/19/16 0500 11/20/16 0400 11/21/16 0600  Weight: 154 lb 5.2 oz (70 kg) 152 lb 12.8 oz (69.3 kg) 151 lb 14.4 oz (68.9 kg)    Weight change:    Hemodynamic parameters for last 24 hours:    Intake/Output from previous day: 02/07 0701 - 02/08 0700 In: 3620 [I.V.:1340; NG/GT:2090; IV Piggyback:150] Out: 3235 [Urine:2415; Stool:350; Chest Tube:470]  Intake/Output this shift: No intake/output data recorded.  Current Meds: Scheduled Meds: . acetylcysteine  2 mL Nebulization TID  . amiodarone  200 mg Per Tube BID  . cefTAZidime (FORTAZ)  IV  1 g Intravenous Q8H  . chlorhexidine  15 mL Mouth Rinse BID  . Chlorhexidine Gluconate Cloth  6 each Topical Daily  . feeding supplement (OSMOLITE 1.5 CAL)  1,000 mL Per Tube Q24H  . feeding supplement (PRO-STAT SUGAR FREE 64)  30 mL Per Tube TID  . free water  200 mL Per Tube Q6H  . furosemide  40 mg Intravenous Once  . Erico Stan's butt cream   Topical BID  . insulin aspart  0-15 Units Subcutaneous Q4H  . ipratropium  0.5 mg Nebulization Q6H  . levalbuterol  0.63 mg Nebulization Q6H  . magic mouthwash  5 mL Oral TID  . mouth rinse  15 mL Mouth Rinse q12n4p  . pantoprazole sodium  40 mg  Per Tube Q1200  . potassium chloride  20 mEq Per Tube Q6H   Or  . potassium chloride  20 mEq Oral Q6H  . sodium chloride flush  10-40 mL Intracatheter Q12H   Continuous Infusions: . sodium chloride Stopped (11/15/16 1500)  . dextrose 50 mL/hr at 11/22/16 0700  . heparin 1,000 Units/hr (11/22/16 0700)   PRN Meds:.acetaminophen, fentaNYL (SUBLIMAZE) injection, levalbuterol, lip balm, potassium chloride (KCL MULTIRUN) 30 mEq in 265 mL IVPB, sodium chloride flush  General appearance: alert and cooperative Neurologic: intact Heart: regular rate and rhythm, S1, S2 normal, no murmur, click, rub or gallop Lungs: diminished breath sounds bibasilar Abdomen: soft, non-tender; bowel sounds normal; no masses,  no organomegaly Extremities: extremities normal, atraumatic, no cyanosis or edema and Homans sign is negative, no sign of DVT Wound: intact, no air leak  Lab Results: CBC: Recent Labs  11/20/16 0349 11/22/16 0400  WBC 12.4* 13.4*  HGB 9.1* 8.3*  HCT 29.0* 26.3*  PLT 363 342   BMET:  Recent Labs  11/21/16 0415 11/22/16 0400  NA 145 141  K 3.3* 3.3*  CL 106 102  CO2 32 34*  GLUCOSE 133* 136*  BUN 26* 26*  CREATININE 0.97 0.83  CALCIUM 8.1* 7.9*    CMET: Lab Results  Component Value Date   WBC 13.4 (H) 11/22/2016   HGB 8.3 (L) 11/22/2016   HCT 26.3 (L) 11/22/2016   PLT 342 11/22/2016   GLUCOSE 136 (H) 11/22/2016   TRIG 137 11/12/2016   ALT 25 11/11/2016   AST 32 11/11/2016   NA 141 11/22/2016   K 3.3 (L) 11/22/2016   CL 102 11/22/2016   CREATININE 0.83 11/22/2016   BUN 26 (H) 11/22/2016   CO2 34 (H) 11/22/2016   INR 1.04 11/11/2016      PT/INR: No results for input(s): LABPROT, INR in the last 72 hours. Radiology: No results found.   Assessment/Plan: S/P Procedure(s) (LRB): VIDEO BRONCHOSCOPY WITH INSERTION OF INTERBRONCHIAL VALVE (IBV) times three (N/A) Mobilize Diuresis Wait on swallow today Still with significant edema  significant contraction  alkalosis , check abg - replace k    [name] 11/22/2016 8:03 AM

## 2016-11-22 NOTE — Progress Notes (Signed)
Physical Therapy Treatment Patient Details Name: [name] MRN: [id number] DOB: 1945/12/10 Today's Date: 11/22/2016    History of Present Illness 71 y.o. 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 Initially was ambulatory in the ICU and out of bed without difficulty. She later had worsening hypoxia and  Intubated for hypoxic respiratory failure 6/73  Course complicated by shock thought to be sepsis from PNA but no positive cultures, AF with RVR, bilateral LE DVT.  Patient extubated 11/14/16.    PT Comments    Pt fatigued today with hypoxia, increased O2 demand, and SOB. Pt declined OOB due to respiratory status and performed bed level exercises. Pt educated for and encouraged to perform HEP and continue to progress mobility. Will continue to follow.  Sats 92-94% on 14L HFNC  HR 93  Follow Up Recommendations  CIR;Supervision for mobility/OOB     Equipment Recommendations       Recommendations for Other Services       Precautions / Restrictions Precautions Precautions: Fall Precaution Comments: chest tube, catheter, flexiseal    Mobility  Bed Mobility               General bed mobility comments: pt declined attempts for any mobility today due to SOB  Transfers                    Ambulation/Gait                 Stairs            Wheelchair Mobility    Modified Rankin (Stroke Patients Only)       Balance                                    Cognition Arousal/Alertness: Awake/alert Behavior During Therapy: Anxious Overall Cognitive Status: Within Functional Limits for tasks assessed                      Exercises General Exercises - Lower Extremity Ankle Circles/Pumps: AROM;Both;Supine;20 reps Quad Sets: AROM;Both;20 reps;Supine Gluteal Sets:  AROM;Both;10 reps;Supine Short Arc Quad: AROM;Both;Supine;10 reps Heel Slides: AROM;Both;Supine;10 reps Hip ABduction/ADduction: AROM;Both;Supine;10 reps Straight Leg Raises: AROM;Supine;5 reps    General Comments        Pertinent Vitals/Pain Pain Assessment: No/denies pain    Home Living                      Prior Function            PT Goals (current goals can now be found in the care plan section) Progress towards PT goals: Not progressing toward goals - comment (limited to bed exercises today due to SOB)    Frequency           PT Plan Current plan remains appropriate    Co-evaluation             End of Session Equipment Utilized During Treatment: Oxygen Activity Tolerance: Patient limited by fatigue Patient left: in bed;with call bell/phone within reach;with bed alarm set     Time: 4193-7902 PT Time Calculation (min) (ACUTE ONLY): 18 min  Charges:  $Therapeutic Exercise: 8-22 mins  G Codes:      Chun Sellen B Tatem Holsonback 11/22/2016, 1:55 PM  [name], [place]

## 2016-11-22 NOTE — Progress Notes (Signed)
[facility] BRIEF SICU PROGRESS NOTE  17 Days Post-Op  S/P Procedure(s) (LRB): VIDEO BRONCHOSCOPY WITH INSERTION OF INTERBRONCHIAL VALVE (IBV) times three (N/A)   Stable day  Plan: Continue current plan  Rexene Alberts, MD 11/22/2016 7:17 PM

## 2016-11-23 ENCOUNTER — Inpatient Hospital Stay (HOSPITAL_COMMUNITY): Payer: Medicare Other

## 2016-11-23 LAB — GLUCOSE, CAPILLARY
Glucose-Capillary: 161 mg/dL — ABNORMAL HIGH (ref 65–99)
Glucose-Capillary: 126 mg/dL — ABNORMAL HIGH (ref 65–99)
Glucose-Capillary: 130 mg/dL — ABNORMAL HIGH (ref 65–99)
Glucose-Capillary: 143 mg/dL — ABNORMAL HIGH (ref 65–99)
Glucose-Capillary: 134 mg/dL — ABNORMAL HIGH (ref 65–99)
Glucose-Capillary: 121 mg/dL — ABNORMAL HIGH (ref 65–99)

## 2016-11-23 LAB — CBC
HCT: 25.3 % — ABNORMAL LOW (ref 36.0–46.0)
Hemoglobin: 8 g/dL — ABNORMAL LOW (ref 12.0–15.0)
MCH: 30.5 pg (ref 26.0–34.0)
MCHC: 31.6 g/dL (ref 30.0–36.0)
MCV: 96.6 fL (ref 78.0–100.0)
Platelets: 292 10*3/uL (ref 150–400)
RBC: 2.62 MIL/uL — ABNORMAL LOW (ref 3.87–5.11)
RDW: 17.8 % — ABNORMAL HIGH (ref 11.5–15.5)
WBC: 11.4 10*3/uL — ABNORMAL HIGH (ref 4.0–10.5)

## 2016-11-23 LAB — BASIC METABOLIC PANEL
Anion gap: 6 (ref 5–15)
BUN: 25 mg/dL — ABNORMAL HIGH (ref 6–20)
CO2: 36 mmol/L — ABNORMAL HIGH (ref 22–32)
Calcium: 7.9 mg/dL — ABNORMAL LOW (ref 8.9–10.3)
Chloride: 97 mmol/L — ABNORMAL LOW (ref 101–111)
Creatinine, Ser: 0.86 mg/dL (ref 0.44–1.00)
GFR calc Af Amer: >60 mL/min (ref >60)
GFR calc non Af Amer: >60 mL/min (ref >60)
Glucose, Bld: 146 mg/dL — ABNORMAL HIGH (ref 65–99)
Potassium: 3.5 mmol/L (ref 3.5–5.1)
Sodium: 139 mmol/L (ref 135–145)

## 2016-11-23 LAB — HEPARIN LEVEL (UNFRACTIONATED): HEPARIN UNFRACTIONATED: 0.35 [IU]/mL (ref 0.30–0.70)

## 2016-11-23 MED ORDER — POTASSIUM CHLORIDE 20 MEQ/15ML (10%) PO SOLN
40.0000 meq | Freq: Once | ORAL | Status: AC
Start: 2016-11-23 — End: 2016-11-23
  Administered 2016-11-23: 40 meq
  Filled 2016-11-23: qty 30

## 2016-11-23 MED ORDER — POTASSIUM CHLORIDE 20 MEQ/15ML (10%) PO SOLN
20.0000 meq | Freq: Four times a day (QID) | ORAL | Status: AC
  Administered 2016-11-23 (×2): 20 meq
  Filled 2016-11-23 (×2): qty 15

## 2016-11-23 MED ORDER — FUROSEMIDE 10 MG/ML IJ SOLN
40.0000 mg | Freq: Once | INTRAMUSCULAR | Status: AC
  Administered 2016-11-23: 40 mg via INTRAVENOUS
  Filled 2016-11-23: qty 4

## 2016-11-23 MED ORDER — OSMOLITE 1.5 CAL PO LIQD
1000.0000 mL | ORAL | Status: DC
  Administered 2016-11-23 – 2016-11-30 (×7): 1000 mL
  Filled 2016-11-23 (×17): qty 1000

## 2016-11-23 MED ORDER — ACETYLCYSTEINE 20 % IN SOLN
2.0000 mL | Freq: Four times a day (QID) | RESPIRATORY_TRACT | Status: DC
  Administered 2016-11-23 – 2016-11-27 (×16): 2 mL via RESPIRATORY_TRACT
  Filled 2016-11-23 (×14): qty 4

## 2016-11-23 MED ORDER — POTASSIUM CHLORIDE CRYS ER 20 MEQ PO TBCR: 20.0000 meq | EXTENDED_RELEASE_TABLET | Freq: Four times a day (QID) | ORAL | Status: AC

## 2016-11-23 NOTE — Progress Notes (Signed)
      [address]       [place] [address]             [phone]      PM Rounds  Resting quietly  BP 128/70   Pulse 83   Temp 98.3 F (36.8 C) (Oral)   Resp (!) 23   Ht '5\' 5"'$  (1.651 m)   Wt 148 lb 2.4 oz (67.2 kg)   SpO2 99%   BMI 24.65 kg/m    Intake/Output Summary (Last 24 hours) at 11/23/16 1811 Last data filed at 11/23/16 1800  Gross per 24 hour  Intake          3105.16 ml  Output             3710 ml  Net          -604.84 ml   No new issues  Remo Lipps C. Roxan Hockey, MD [facility] [phone]

## 2016-11-23 NOTE — Progress Notes (Signed)
[facility] [place] NOTE   Pharmacy Consult for:  Heparin Indication: DVT and atrial fibrillation  Allergies  Allergen Reactions  . Nickel Rash    Patient Measurements: Height: '5\' 5"'$  (165.1 cm) Weight: 148 lb 2.4 oz (67.2 kg) IBW/kg (Calculated) : 57 Heparin Dosing Weight: 69 kg  Vital Signs: Temp: 97.9 F (36.6 C) (02/09 0730) Temp Source: Oral (02/09 0730) BP: 123/63 (02/09 0800) Pulse Rate: 80 (02/09 0800)  Labs:  Recent Labs  11/21/16 0415 11/22/16 0400 11/23/16 0345  HGB  --  8.3* 8.0*  HCT  --  26.3* 25.3*  PLT  --  342 292  HEPARINUNFRC 0.43 0.34 0.35  CREATININE 0.97 0.83 0.86    Estimated Creatinine Clearance: 54 mL/min (by C-G formula based on SCr of 0.86 mg/dL).   Assessment: 4 yoF with dopplers positive for bilateral peroneal DVT also with AFib. Argatroban stopped 2/1 and transitioned back to heparin as HIT negative. Noted, heparin decreased to 800 units/hr on 2/4 due to bleeding around the airway - resolved. Back up to 1000 units/hr on 2/5.  Heparin level remains therapeutic (0.35) on 1000 units/hour. Hgb trended down to 8, plt WNL. No bleeding reported.   Goal of Therapy:  Heparin level 0.3- 0.7 Monitor platelets by anticoagulation protocol: Yes   Plan:  Continue Heparin drip at 1000 units/hour Daily heparin level and CBC Monitor for s/sx bleeding.   Elicia Lamp, PharmD, BCPS Clinical Pharmacist 11/23/2016 8:40 AM

## 2016-11-23 NOTE — Progress Notes (Signed)
Patient finished CPT. Patient with rhonchi. Nasal trumpet inserted due to NTS ordered TID. NTS done obtaining moderate amount of tan, bloody thick secretions. RN at bedside. Patient tolerated well.

## 2016-11-23 NOTE — Progress Notes (Signed)
SLP Cancellation Note  Patient Details Name: KADEY MIHALIC MRN: [id number] DOB: 11-22-1945   Cancelled treatment:       Reason Eval/Treat Not Completed: Patient not medically ready; respiratory status remains too precarious to proceed with MBS today per PA note.  Cancelled - will continue to follow and repeat study when appropriate. Thank you.   Juan Quam Laurice 11/23/2016, 9:34 AM

## 2016-11-23 NOTE — Progress Notes (Signed)
CPT completed at this time. Patient rhonchi throughout all lung fields. NTS copious amounts of tan, clear thick secretions. Patient tolerated well. Vitals remained stable. Lung fields clear, diminished post suctioning. Will continue to monitor.

## 2016-11-23 NOTE — Progress Notes (Addendum)
TCTS DAILY ICU PROGRESS NOTE                   [address]            [place] [address]          [phone]   18 Days Post-Op Procedure(s) (LRB): VIDEO BRONCHOSCOPY WITH INSERTION OF INTERBRONCHIAL VALVE (IBV) times three (N/A) Day 25:Post-Op Procedure(s) (LRB): VIDEO BRONCHOSCOPY (N/A) RIGHT VIDEO ASSISTED THORACOSCOPY /RIGHT UPPER LOBE LUNG RESECTION and Right Thoracotomy (Right) LYMPH NODE DISSECTION (Right  Total Length of Stay:  LOS: 25 days   Subjective: She states her breathing is not too good of late. She has multiple mouth sores.  Objective: Vital signs in last 24 hours: Temp:  [97.9 F (36.6 C)-98.7 F (37.1 C)] 97.9 F (36.6 C) (02/09 0730) Pulse Rate:  [74-95] 80 (02/09 0800) Cardiac Rhythm: Normal sinus rhythm (02/09 0742) Resp:  [18-31] 21 (02/09 0800) BP: (99-148)/(49-73) 123/63 (02/09 0800) SpO2:  [92 %-100 %] 96 % (02/09 0800) Weight:  [148 lb 2.4 oz (67.2 kg)] 148 lb 2.4 oz (67.2 kg) (02/09 0500)  Filed Weights   11/21/16 0600 11/22/16 0600 11/23/16 0500  Weight: 151 lb 14.4 oz (68.9 kg) 151 lb 0.2 oz (68.5 kg) 148 lb 2.4 oz (67.2 kg)    Weight change: -2 lb 13.9 oz (-1.3 kg)      Intake/Output from previous day: 02/08 0701 - 02/09 0700 In: 3285 [I.V.:1320; NG/GT:1600; IV Piggyback:365] Out: 3905 [Urine:3515; Chest Tube:390]  Intake/Output this shift: Total I/O In: 100 [I.V.:60; NG/GT:40] Out: -   Current Meds: Scheduled Meds: . acetylcysteine  2 mL Nebulization TID  . amiodarone  200 mg Per Tube BID  . cefTAZidime (FORTAZ)  IV  1 g Intravenous Q8H  . chlorhexidine  15 mL Mouth Rinse BID  . Chlorhexidine Gluconate Cloth  6 each Topical Daily  . feeding supplement (OSMOLITE 1.5 CAL)  1,000 mL Per Tube Q24H  . feeding supplement (PRO-STAT SUGAR FREE 64)  30 mL Per Tube TID  . free water  200 mL Per Tube Q6H  . Kendle Turbin's butt cream   Topical BID  . insulin aspart  0-15 Units Subcutaneous Q4H  . ipratropium  0.5 mg  Nebulization Q6H  . levalbuterol  0.63 mg Nebulization Q6H  . magic mouthwash  5 mL Oral TID  . mouth rinse  15 mL Mouth Rinse q12n4p  . pantoprazole sodium  40 mg Per Tube Q1200  . sodium chloride flush  10-40 mL Intracatheter Q12H   Continuous Infusions: . sodium chloride Stopped (11/15/16 1500)  . dextrose 50 mL/hr at 11/23/16 0800  . heparin 1,000 Units/hr (11/23/16 0800)   PRN Meds:.acetaminophen, fentaNYL (SUBLIMAZE) injection, levalbuterol, lip balm, potassium chloride (KCL MULTIRUN) 30 mEq in 265 mL IVPB, sodium chloride, sodium chloride flush  General appearance: Frail, alert, appears stated age and no distress.  Heart: RRR Lungs: Coarse breath sounds Abdomen: soft, non-tender; sporadic bowel sounds Extremities: Mild LE edema Wound: Clean and dry Chest tubes: to water seal and no air leak  Lab Results: CBC:  Recent Labs  11/22/16 0400 11/23/16 0345  WBC 13.4* 11.4*  HGB 8.3* 8.0*  HCT 26.3* 25.3*  PLT 342 292   BMET:   Recent Labs  11/22/16 0400 11/23/16 0345  NA 141 139  K 3.3* 3.5  CL 102 97*  CO2 34* 36*  GLUCOSE 136* 146*  BUN 26* 25*  CREATININE 0.83 0.86  CALCIUM 7.9* 7.9*  CMET: Lab Results  Component Value Date   WBC 11.4 (H) 11/23/2016   HGB 8.0 (L) 11/23/2016   HCT 25.3 (L) 11/23/2016   PLT 292 11/23/2016   GLUCOSE 146 (H) 11/23/2016   TRIG 137 11/12/2016   ALT 25 11/11/2016   AST 32 11/11/2016   NA 139 11/23/2016   K 3.5 11/23/2016   CL 97 (L) 11/23/2016   CREATININE 0.86 11/23/2016   BUN 25 (H) 11/23/2016   CO2 36 (H) 11/23/2016   INR 1.04 11/11/2016      PT/INR: No results for input(s): LABPROT, INR in the last 72 hours. Radiology: Dg Chest Port 1 View  Result Date: 11/23/2016 CLINICAL DATA:  Chest tube EXAM: PORTABLE CHEST 1 VIEW COMPARISON:  November 22, 2016 FINDINGS: The right-sided chest tube is stable. The right central line is stable. A feeding tube remains. No definitive pneumothorax in the right apex. No  pneumothorax on the left. Near complete opacification of the right lung remains. Mild opacity and effusion in the left base is stable. Volume loss on the right with shift of the heart mediastinum to the right, also stable. IMPRESSION: 1. The right chest tube is stable with no definitive pneumothorax. Near complete opacification of the right chest is unchanged. 2. Mild opacity and effusion in the left base is stable. Electronically Signed   By: Dorise Bullion III M.D   On: 11/23/2016 08:43     Assessment/Plan:   Procedure(s) (LRB): VIDEO BRONCHOSCOPY WITH INSERTION OF INTERBRONCHIAL VALVE (IBV) times three (N/A) Day Post-Op Procedure(s) (LRB): VIDEO BRONCHOSCOPY WITH INSERTION OF INTERBRONCHIAL VALVE (IBV) times three (N/A) Post-Op Procedure(s) (LRB): VIDEO BRONCHOSCOPY (N/A) RIGHT VIDEO ASSISTED THORACOSCOPY /RIGHT UPPER LOBE LUNG RESECTION and Right Thoracotomy (Right) LYMPH NODE DISSECTION (Right    1.CV-SR in the 80's this am. On Amiodarone 200 mg bid per tube. On Heparin drip for DVTs, previous a fib. 2.Pulmonary-s/p RUL 1/18 and s/p EBV valves on 01/22 for persistent air leak. Chest tubes with 390 cc of yellow pleural fluid last 24 hours .   Chest tubes to suction and with no air leak. On 8 liters of oxygen via [place]. CXR shows near complete whiteout of right chest. Continue flutter valve and incentive spirometer. She may need bronchoscopy.  3. GI-Swallow study results Delayed oral transit due to generalized weakness and oral pain (buccal, labial ulcerations).  Will need repeat swallow study eventually but will hold for now to try to optimize respiratory status. NPO status. Nutrition recommended TFs at 40 ml/hr. IVF currently at 50 ml/hr 4. ABL anemia-Last H and H this am down to 8 and 25.3 5.ID-On  Tressie Ellis (concern for new HCAP) 6. Hypokalemia-potassium 3.5 this am. Supplemented via IV 7. Continue with daily oral mouth care  ZIMMERMAN,DONIELLE M PA-C 11/23/2016 8:56 AM   Increase  suctioning , hold off on swallow  I have seen and examined [name] and agree with the above assessment  and plan.  [name] MD Beeper [phone] Office [phone] 11/23/2016 12:20 PM

## 2016-11-23 NOTE — Progress Notes (Signed)
Nutrition Follow Up  DOCUMENTATION CODES:   Not applicable  INTERVENTION:    Continue Osmolite 1.5 at goal rate of 40 ml/hr with Prostat 30 ml TID  TF regimen providing 1740 kcals, 105 gm protein, 731 ml of free water  NUTRITION DIAGNOSIS:   Inadequate oral intake related to inability to eat as evidenced by NPO status, ongoing  GOAL:   Patient will meet greater than or equal to 90% of their needs, met  MONITOR:   TF tolerance, Diet advancement, Labs, Weight trends, Skin, I & O's  ASSESSMENT:   71 y.o. Female with septic shock and acute hypoxic respiratory failure. Septic source is likely pneumonia. Patient now developing delirium and mild hypercarbia given recent ABG. Lactic acidosis is improving from procalcitonin remains significantly elevated.   Pt s/p procedures 1/22: VIDEO BRONCHOSCOPY WITH INSERTION OF INTERBRONCHIAL VALVE (IBV) x 3  Osmolite 1.5 formula infusing at goal rate of 40 ml/hr via CORTRAK feeding tube (placed 2/5). Pt nodded "yes" to RD when asked if she's feeling less full. Free water flushes at 200 ml q 6 hrs. CBG's [id number]. Weight stable.  Diet Order:  Diet NPO time specified  Skin:  Wound (see comment) (skin tear to R buttocks)  Last BM:  2/9  Height:   Ht Readings from Last 1 Encounters:  11/23/16 '5\' 5"'$  (1.651 m)   Weight:  Wt Readings from Last 1 Encounters:  11/23/16 148 lb 2.4 oz (67.2 kg)   BMI:  Body mass index is 24.65 kg/m.  Estimated Nutritional Needs:   Kcal:  0165-5374  Protein:  105-115 gm  Fluid:  1.7-1.9 L  EDUCATION NEEDS:   No education needs identified at this time  Arthur Holms, RD, LDN Pager #: [phone] After-Hours Pager #: [phone]

## 2016-11-23 NOTE — Progress Notes (Signed)
PULMONARY / CRITICAL CARE MEDICINE   Name: [name] MRN: [id number] DOB: 1946-04-03    ADMISSION DATE:  10/29/2016 CONSULTATION DATE:  11/07/2016  REFERRING MD:  Lanelle Bal, M.D. / CVTS  CHIEF COMPLAINT:  Acute Hypoxic Respiratory Failure  BRIEF SUMMARY:  71 y.o. 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 Initially was ambulatory in the ICU and out of bed without difficulty. She later had worsening hypoxia and  Intubated for hypoxic respiratory failure 4/09  Course complicated by shock thought to be sepsis from PNA but no positive cultures, AF with RVR, bilateral LE DVT.   SUBJECTIVE:  Pt reports feeling tired, having difficulty clearing secretions.  Denies fevers, chills.    VITAL SIGNS: BP (!) 156/74   Pulse 95   Temp 97.9 F (36.6 C) (Oral)   Resp (!) 32   Ht '5\' 5"'$  (1.651 m)   Wt 148 lb 2.4 oz (67.2 kg)   SpO2 94%   BMI 24.65 kg/m   HEMODYNAMICS:    VENTILATOR SETTINGS:    INTAKE / OUTPUT: I/O last 3 completed shifts: In: 8119 [I.V.:2040; Other:40; NG/GT:2590; IV Piggyback:465] Out: 1478 [Urine:4300; Stool:250; Chest Tube:520]  PHYSICAL EXAMINATION: General:  Elderly female in NAD, sitting in chair HEENT: MM pink/moist, right lip lesion with small amt bleeding Neuro: AAOx4, speech clear, MAE, generalized weakness CV: s1s2 rrr, no m/r/g PULM: even/non-labored, diminished on R, chest tube in place, clear on L GN:FAOZ, non-tender, bsx4 active  Extremities: warm/dry, generalized 1-2+ edema  Skin: no rashes or lesions     LABS:  Recent Labs Lab 11/20/16 0349 11/22/16 0400 11/23/16 0345  HGB 9.1* 8.3* 8.0*  HCT 29.0* 26.3* 25.3*  WBC 12.4* 13.4* 11.4*  PLT 363 342 292    Recent Labs Lab 11/19/16 0500 11/20/16 0349 11/21/16 0415 11/22/16 0400 11/23/16 0345  NA 149* 146* 145 141 139   K 3.3* 3.4* 3.3* 3.3* 3.5  CL 114* 109 106 102 97*  CO2 32 30 32 34* 36*  GLUCOSE 112* 144* 133* 136* 146*  BUN 28* 24* 26* 26* 25*  CREATININE 1.00 0.95 0.97 0.83 0.86  CALCIUM 8.2* 8.3* 8.1* 7.9* 7.9*  MG  --   --  1.7 1.5*  --   PHOS  --   --   --  1.8*  --     Recent Labs Lab 11/22/16 1415  PHART 7.389  PCO2ART 59.3*  PO2ART 60.9*  HCO3 35.1*  O2SAT 90.4    STUDIES:  PFT 08/24/16 >> FVC 2.54 L (89%) FEV1 1.22 L (57%) FEV1/FVC 0.48 FEF 25-75 0.44 L (24%) negative bronchial dilator response TLC 5.19 L (120%) RV 155% ERV 76% DLCO uncorrected 52% TTE 1/24 >>  Poor quality study. Unable to assess RV. No pericardial effusion. LV with mild LVH but normal in size. EF 60-65%. Unable to assess wall motion. Grade 1 diastolic dysfunction. BILAT LE VENOUS DUPLEX 1/24 >>  There is evidence of acute deep vein thrombosis involving the peroneal veins of the right and left lower extremities. Renal US 1/27 >> no hydronephrosis or stones  MICROBIOLOGY: MRSA PCR 1/11:  Negative Right Bronchial Washing 1/22:  Negative Blood Ctx x2 1/24 >> ng Urine Ctx 1/24 >> negative Sputum 2/3 >> few candida albicans  ANTIBIOTICS: Vancomycin 1/24 >>1/30 Zosyn 1/24 >>1/31 Ceftazidime 2/3  >> Diflucan 2/3 >>2/6  SIGNIFICANT EVENTS: 1/15 - Admit  w/ RUL Lobectomy for Stage IB squamous cell  1/22 - Bronchoscopy with 3 right-sided endobronchial valves for persistent air leak 1/26 - Hypoxic respiratory failure, intubated  1/28 - Heparin d/c'd due to thrombocytopenia, HIT panel pending.  Changed to argatroban. 1/30 - still with air leak, weaning well 1/31 - extubated 2/03 - ABX restarted  2/05 - CXR with R volume loss with rightward shift, chest tubes in place   LINES/TUBES: Right Chest Tubes 1/15 >> R IJ CLV 1/24 >> Foley 1/24 >>   ASSESSMENT / PLAN:  71 y.o. female admitted for right upper lobectomy completed on 1/15. Patient had subsequent persistent air leak. Patient's shock and acute hypoxic  respiratory failure likely due to evolving sepsis from PNA (NOS). Source likely from her lung given recent surgery.  Course complicated by BPF, AF-RVR & DVT  PULMONARY A: S/P Acute Hypoxic Respiratory Failure secondary to PNA, treated.  RUL NSCLCA - s/p RULobectomy  Concern for new HCAP 2/3 abx restarted Persistent Right Pneumothorax & Air Leak post-lobectomy - Chest tubes in place & Endobronchial valves placed 1/22 Significant R lung atx and volume loss Moderate to Severe COPD P:   Continue aggressive pulmonary hygiene - IS, mobilize, flutter  Continue mucomyst nebs to aide in secretion clearance Consider FOB for mechanical secretion removal > CXR with ongoing airspace disease on R Trend CXR Wean O2 as needed for sats > 90% Continue diuresis as renal function / BP permit Chest tube mgmt per CVTS  CARDIOVASCULAR A:  S/P Septic Shock likely secondary to PNA, resolved Afib with RVR , converted sinus 1/31 Bilateral Calf Vein/Peroneal DVT H/O HTN, HLD  P:  Amiodarone 200 mg BID  Heparin gtt per pharmacy   RENAL A:   Acute Renal Failure, resolved Hypernatremia - down-trending  Hypokalemia P:  Free water 200 ml Q6 Reduce D5w to 30 ml/hr Trend BMP / UOP  Lasix as above  GASTROINTESTINAL A:   Nausea and Vomiting - resolved R/o dysphagia P:   Continue TF  SLP following, appreciate input  PPI for SUP   HEMATOLOGIC A:   Bilateral peroneal DVT Thrombocytopenia - resolved, argatroban 1/28 due to thrombocytopenia, HIT neg ELISA, back on heparin drip P: Heparin gtt  Trend CBC  Monitor for evidence of bleeding  INFECTIOUS A:   S/P Septic Shock secondary to PNA   Leucocytosis Small amt candida in sputum - S/P 4 days antifungal  P:   Continue Ceftaz, D6/8  Follow CBC, temp curve  ENDOCRINE A:   Hyperglycemia controlled  P:   Trend CBG SSI   NEUROLOGIC A:   Acute Metabolic Encephalopathy - resolved Post Operative Pain   P:   PT, OT, SLP  Minimize sedation  as able    FAMILY  - Updates:  Patient, husband, son updated at bedside.    Noe Gens, NP-C  Pulmonary & Critical Care Pgr: [phone] or if no answer [phone] 11/23/2016, 10:52 AM   Attending Note:  I have examined patient, reviewed labs, studies and notes. I have discussed the case with B Ollis, and I agree with the data and plans as amended above. 71 year old woman with a right upper lobectomy for non-small cell lung cancer. She had persistent airway can is undergone endobronchial valve placement. She has 2 chest tubes in place. There is no longer an air leak. Largest issue for the last several days has been poor secretion clearance and severe right-sided atelectasis. On her chest x-ray today she has increased right-sided volume loss. We have  been trying to continue pulmonary hygiene aggressively but her oxygen need is higher in her chest x-ray looks worse. She may ultimately need repeat bronchoscopy to clear secretions.   Please call if we can help over the weekend. We will check on her on Monday.   Baltazar Apo, MD, PhD 11/23/2016, 2:39 PM [facility] [phone] or if no answer [phone]

## 2016-11-24 ENCOUNTER — Inpatient Hospital Stay (HOSPITAL_COMMUNITY): Payer: Medicare Other

## 2016-11-24 LAB — GLUCOSE, CAPILLARY
Glucose-Capillary: 152 mg/dL — ABNORMAL HIGH (ref 65–99)
Glucose-Capillary: 94 mg/dL (ref 65–99)
Glucose-Capillary: 118 mg/dL — ABNORMAL HIGH (ref 65–99)
Glucose-Capillary: 133 mg/dL — ABNORMAL HIGH (ref 65–99)
Glucose-Capillary: 133 mg/dL — ABNORMAL HIGH (ref 65–99)

## 2016-11-24 LAB — CBC
HCT: 26.4 % — ABNORMAL LOW (ref 36.0–46.0)
HEMOGLOBIN: 8.2 g/dL — AB (ref 12.0–15.0)
MCH: 30 pg (ref 26.0–34.0)
MCHC: 31.1 g/dL (ref 30.0–36.0)
MCV: 96.7 fL (ref 78.0–100.0)
Platelets: 307 10*3/uL (ref 150–400)
RBC: 2.73 MIL/uL — AB (ref 3.87–5.11)
RDW: 17.5 % — ABNORMAL HIGH (ref 11.5–15.5)
WBC: 9.6 10*3/uL (ref 4.0–10.5)

## 2016-11-24 LAB — BASIC METABOLIC PANEL
ANION GAP: 9 (ref 5–15)
BUN: 19 mg/dL (ref 6–20)
CHLORIDE: 91 mmol/L — AB (ref 101–111)
CO2: 37 mmol/L — ABNORMAL HIGH (ref 22–32)
CREATININE: 0.83 mg/dL (ref 0.44–1.00)
Calcium: 7.9 mg/dL — ABNORMAL LOW (ref 8.9–10.3)
GFR calc non Af Amer: >60 mL/min (ref >60)
Glucose, Bld: 158 mg/dL — ABNORMAL HIGH (ref 65–99)
POTASSIUM: 3.6 mmol/L (ref 3.5–5.1)
SODIUM: 137 mmol/L (ref 135–145)

## 2016-11-24 LAB — MAGNESIUM: Magnesium: 1.7 mg/dL (ref 1.7–2.4)

## 2016-11-24 LAB — HEPARIN LEVEL (UNFRACTIONATED): Heparin Unfractionated: 0.3 IU/mL (ref 0.30–0.70)

## 2016-11-24 LAB — PHOSPHORUS: PHOSPHORUS: 1.7 mg/dL — AB (ref 2.5–4.6)

## 2016-11-24 MED ORDER — POTASSIUM CHLORIDE CRYS ER 20 MEQ PO TBCR: 20.0000 meq | EXTENDED_RELEASE_TABLET | Freq: Four times a day (QID) | ORAL | Status: AC

## 2016-11-24 MED ORDER — POTASSIUM CHLORIDE 20 MEQ/15ML (10%) PO SOLN
20.0000 meq | Freq: Four times a day (QID) | ORAL | Status: AC
  Administered 2016-11-24 (×2): 20 meq
  Filled 2016-11-24 (×2): qty 15

## 2016-11-24 NOTE — Progress Notes (Signed)
      [address]       [place] [address]             [phone]      Feels a little better this afternoon  BP 130/63   Pulse 72   Temp 98.5 F (36.9 C) (Oral)   Resp (!) 26   Ht '5\' 5"'$  (1.651 m)   Wt 143 lb 4.8 oz (65 kg)   SpO2 100%   BMI 23.85 kg/m    Intake/Output Summary (Last 24 hours) at 11/24/16 1801 Last data filed at 11/24/16 1600  Gross per 24 hour  Intake          2324.23 ml  Output             2815 ml  Net          -490.77 ml   RT clearing some thick secretions with NTS  Remo Lipps C. Roxan Hockey, MD [facility] [phone]

## 2016-11-24 NOTE — Progress Notes (Signed)
Speech Language Pathology  Patient Details Name: [name] MRN: [id number] DOB: 1946/04/28 Today's Date: 11/24/2016 Time:  -     Checked in with RN and "eyeballed" pt. MBS cancelled yesterday due to respiratory concerns. She appears to be resting comfortably after walk with RN this afternoon. Has NGT for nutrition. Will follow up with MBS when able.   Houston Siren 11/24/2016, 4:31 PM   Orbie Pyo Colvin Caroli.[employer] [phone]

## 2016-11-24 NOTE — Progress Notes (Signed)
Upon arrival, patient with flail chest (right side with hardly any movement on inspiration) and this being new. She states she can't breath as good and she is super dry. Son at bedside concerned with breathing. CPT completed about 20 minutes prior. Patient NTS at this time obtaining copious amounts of thick, tan secretions. Vitals remained stable throughout procedure. Chest rise on the right side now with more movement and patient not in as much distress. RN at bedside. MD notified.

## 2016-11-24 NOTE — Progress Notes (Signed)
19 Days Post-Op Procedure(s) (LRB): VIDEO BRONCHOSCOPY WITH INSERTION OF INTERBRONCHIAL VALVE (IBV) times three (N/A) Subjective: Up in chair, feels worse in the mornings  Objective: Vital signs in last 24 hours: Temp:  [98.3 F (36.8 C)] 98.3 F (36.8 C) (02/10 0355) Pulse Rate:  [71-95] 71 (02/10 0700) Cardiac Rhythm: Normal sinus rhythm (02/10 0400) Resp:  [13-32] 27 (02/10 0700) BP: (108-156)/(63-89) 138/68 (02/10 0600) SpO2:  [94 %-100 %] 95 % (02/10 0814) Weight:  [143 lb 4.8 oz (65 kg)] 143 lb 4.8 oz (65 kg) (02/10 0600)  Hemodynamic parameters for last 24 hours:    Intake/Output from previous day: 02/09 0701 - 02/10 0700 In: 2975.2 [I.V.:714; EF/EO:7121.9; IV Piggyback:150] Out: 7588 [Urine:3720; Stool:5; Chest Tube:110] Intake/Output this shift: No intake/output data recorded.  General appearance: alert, cooperative and no distress Neurologic: intact Heart: regular rate and rhythm Lungs: diminished breath sounds on right, some air movement at apex Abdomen: normal findings: soft, non-tender small air leak with cough  Lab Results:  Recent Labs  11/23/16 0345 11/24/16 0415  WBC 11.4* 9.6  HGB 8.0* 8.2*  HCT 25.3* 26.4*  PLT 292 307   BMET:  Recent Labs  11/23/16 0345 11/24/16 0415  NA 139 137  K 3.5 3.6  CL 97* 91*  CO2 36* 37*  GLUCOSE 146* 158*  BUN 25* 19  CREATININE 0.86 0.83  CALCIUM 7.9* 7.9*    PT/INR: No results for input(s): LABPROT, INR in the last 72 hours. ABG    Component Value Date/Time   PHART 7.389 11/22/2016 1415   HCO3 35.1 (H) 11/22/2016 1415   TCO2 28 11/12/2016 1519   ACIDBASEDEF 5.0 (H) 11/11/2016 0423   O2SAT 90.4 11/22/2016 1415   CBG (last 3)   Recent Labs  11/23/16 2104 11/23/16 2332 11/24/16 0355  GLUCAP 134* 121* 152*    Assessment/Plan: S/P Procedure(s) (LRB): VIDEO BRONCHOSCOPY WITH INSERTION OF INTERBRONCHIAL VALVE (IBV) times three (N/A) -CV- in SR on amiodarone   RESP- slightly increased  atelectasis on right  Continue nebs, IS, flutter  Keep CT in place  On Fortaz for HCAP  RENAL- creatinine OK  ENDO- CBG controlled  GI/ Nutrition- Panda replaced- resume TF  NPO due to aspiration  Mobilize as tolerated   LOS: 26 days    Melrose Nakayama 11/24/2016

## 2016-11-24 NOTE — Progress Notes (Signed)
Post CPT. Patient NTS obtaining moderate amount of tan, clear secretions. Patient tolerated well. Vitals stable.

## 2016-11-24 NOTE — Progress Notes (Signed)
[place] NOTE   Pharmacy Consult for:  Heparin Indication: DVT and atrial fibrillation  Allergies  Allergen Reactions  . Nickel Rash    Patient Measurements: Height: '5\' 5"'$  (165.1 cm) Weight: 143 lb 4.8 oz (65 kg) IBW/kg (Calculated) : 57 Heparin Dosing Weight: 69 kg  Vital Signs: Temp: 98.1 F (36.7 C) (02/10 0900) Temp Source: Oral (02/10 0900) BP: 108/69 (02/10 0900) Pulse Rate: 77 (02/10 0900)  Labs:  Recent Labs  11/22/16 0400 11/23/16 0345 11/24/16 0415  HGB 8.3* 8.0* 8.2*  HCT 26.3* 25.3* 26.4*  PLT 342 292 307  HEPARINUNFRC 0.34 0.35 0.30  CREATININE 0.83 0.86 0.83    Estimated Creatinine Clearance: 55.9 mL/min (by C-G formula based on SCr of 0.83 mg/dL).   Assessment: 60 yoF with dopplers positive for bilateral peroneal DVT also with AFib. Argatroban stopped 2/1 and transitioned back to heparin as HIT negative. Noted, heparin decreased to 800 units/hr on 2/4 due to bleeding around the airway - resolved. Back up to 1000 units/hr on 2/5.  Heparin level remains low therapeutic (0.3) on 1000 units/hour. Hgb 8.2, plt WNL. No bleeding reported.   Goal of Therapy:  Heparin level 0.3- 0.7 Monitor platelets by anticoagulation protocol: Yes   Plan:  Increase Heparin drip slightly to 1050 units/hour to make sure stays in range Daily heparin level and CBC Monitor for s/sx bleeding   Elicia Lamp, PharmD, BCPS Clinical Pharmacist 11/24/2016 10:34 AM

## 2016-11-24 NOTE — Progress Notes (Signed)
Post CPT. Patient NTS obtaining small amount of tan, thick clumpy secretions. Vitals remained stable.

## 2016-11-24 NOTE — Progress Notes (Signed)
Patient back in bed, so CPT started at this time.

## 2016-11-25 ENCOUNTER — Inpatient Hospital Stay (HOSPITAL_COMMUNITY): Payer: Medicare Other

## 2016-11-25 LAB — GLUCOSE, CAPILLARY
Glucose-Capillary: 102 mg/dL — ABNORMAL HIGH (ref 65–99)
Glucose-Capillary: 112 mg/dL — ABNORMAL HIGH (ref 65–99)
Glucose-Capillary: 132 mg/dL — ABNORMAL HIGH (ref 65–99)
Glucose-Capillary: 136 mg/dL — ABNORMAL HIGH (ref 65–99)
Glucose-Capillary: 136 mg/dL — ABNORMAL HIGH (ref 65–99)
Glucose-Capillary: 142 mg/dL — ABNORMAL HIGH (ref 65–99)
Glucose-Capillary: 145 mg/dL — ABNORMAL HIGH (ref 65–99)

## 2016-11-25 LAB — HEPARIN LEVEL (UNFRACTIONATED): HEPARIN UNFRACTIONATED: 0.42 [IU]/mL (ref 0.30–0.70)

## 2016-11-25 NOTE — Progress Notes (Signed)
      [address]       [place] [address]             [phone]      Has had a pretty good day CXR this AM showed markedly improved aeration on rigth, although still far from ideal BP 130/65 (BP Location: Left Arm)   Pulse 74   Temp 98 F (36.7 C) (Oral)   Resp (!) 26   Ht '5\' 5"'$  (1.651 m)   Wt 141 lb 8.6 oz (64.2 kg)   SpO2 98%   BMI 23.55 kg/m    Intake/Output Summary (Last 24 hours) at 11/25/16 1817 Last data filed at 11/25/16 1800  Gross per 24 hour  Intake             2452 ml  Output             2310 ml  Net              142 ml   Continue present care  Remo Lipps C. Roxan Hockey, MD [facility] [phone]

## 2016-11-25 NOTE — Progress Notes (Signed)
20 Days Post-Op Procedure(s) (LRB): VIDEO BRONCHOSCOPY WITH INSERTION OF INTERBRONCHIAL VALVE (IBV) times three (N/A) Subjective: Some productive cough  Objective: Vital signs in last 24 hours: Temp:  [98.1 F (36.7 C)-98.5 F (36.9 C)] 98.3 F (36.8 C) (02/11 0410) Pulse Rate:  [71-90] 75 (02/11 0800) Cardiac Rhythm: Normal sinus rhythm (02/11 0800) Resp:  [17-30] 18 (02/11 0800) BP: (108-150)/(63-85) 135/74 (02/11 0800) SpO2:  [90 %-100 %] 100 % (02/11 0800) Weight:  [141 lb 8.6 oz (64.2 kg)] 141 lb 8.6 oz (64.2 kg) (02/11 0500)  Hemodynamic parameters for last 24 hours:    Intake/Output from previous day: 02/10 0701 - 02/11 0700 In: 2231.7 [I.V.:490.1; PJ/AS:5053.9; IV Piggyback:100] Out: 2235 [Urine:1655; Chest Tube:580] Intake/Output this shift: Total I/O In: 70.5 [I.V.:20.5; NG/GT:50] Out: 0   General appearance: alert, cooperative and no distress Neurologic: intact Heart: regular rate and rhythm Lungs: diminished breath sounds on right Abdomen: normal findings: soft, non-tender no air leak, serous drainage from CT  Lab Results:  Recent Labs  11/23/16 0345 11/24/16 0415  WBC 11.4* 9.6  HGB 8.0* 8.2*  HCT 25.3* 26.4*  PLT 292 307   BMET:  Recent Labs  11/23/16 0345 11/24/16 0415  NA 139 137  K 3.5 3.6  CL 97* 91*  CO2 36* 37*  GLUCOSE 146* 158*  BUN 25* 19  CREATININE 0.86 0.83  CALCIUM 7.9* 7.9*    PT/INR: No results for input(s): LABPROT, INR in the last 72 hours. ABG    Component Value Date/Time   PHART 7.389 11/22/2016 1415   HCO3 35.1 (H) 11/22/2016 1415   TCO2 28 11/12/2016 1519   ACIDBASEDEF 5.0 (H) 11/11/2016 0423   O2SAT 90.4 11/22/2016 1415   CBG (last 3)   Recent Labs  11/24/16 2020 11/25/16 0001 11/25/16 0408  GLUCAP 133* 145* 136*    Assessment/Plan: S/P Procedure(s) (LRB): VIDEO BRONCHOSCOPY WITH INSERTION OF INTERBRONCHIAL VALVE (IBV) times three (N/A) -CV- maintaining SR  RESP_ Right lung  atelectasis  Continue IS, flutter, nebs, NTS  Remains on Fortaz  No air leak but still relatively high output from CT- keep for now  RENAL- no issues at present  ENDO- CBG well controlled  GI/Nutrition- tolerating TF, NPO due to aspiration  Mobilize   LOS: 27 days    [name] 11/25/2016

## 2016-11-25 NOTE — Progress Notes (Signed)
[place] NOTE   Pharmacy Consult for:  Heparin Indication: DVT and atrial fibrillation  Allergies  Allergen Reactions  . Nickel Rash    Patient Measurements: Height: '5\' 5"'$  (165.1 cm) Weight: 141 lb 8.6 oz (64.2 kg) IBW/kg (Calculated) : 57 Heparin Dosing Weight: 69 kg  Vital Signs: Temp: 97.6 F (36.4 C) (02/11 0832) Temp Source: Oral (02/11 0832) BP: 125/67 (02/11 1000) Pulse Rate: 78 (02/11 1000)  Labs:  Recent Labs  11/23/16 0345 11/24/16 0415 11/25/16 0422  HGB 8.0* 8.2*  --   HCT 25.3* 26.4*  --   PLT 292 307  --   HEPARINUNFRC 0.35 0.30 0.42  CREATININE 0.86 0.83  --     Estimated Creatinine Clearance: 55.9 mL/min (by C-G formula based on SCr of 0.83 mg/dL).   Assessment: 32 yoF with dopplers positive for bilateral peroneal DVT also with AFib. Argatroban stopped 2/1 and transitioned back to heparin as HIT negative. Noted, heparin decreased to 800 units/hr on 2/4 due to bleeding around the airway - resolved. Back up to 1000 units/hr on 2/5.  Heparin level remains therapeutic (0.42) on 1000 units/hour. Hgb 8.2, plt WNL. No bleeding reported.   Goal of Therapy:  Heparin level 0.3- 0.7 Monitor platelets by anticoagulation protocol: Yes   Plan:  Heparin drip at 1050 units/hr Daily heparin level and CBC Monitor for s/sx bleeding   Elicia Lamp, PharmD, BCPS Clinical Pharmacist 11/25/2016 10:27 AM

## 2016-11-25 NOTE — Progress Notes (Signed)
SLP Cancellation Note  Patient Details Name: [name] MRN: [id number] DOB: 01/15/1946   Cancelled treatment:       Reason Eval/Treat Not Completed: Patient not medically ready. Planned for MBS today, but checked in with RN and pt still significantly weak, CXR not improved, and secretion management a problem. Will defer testing today. May benefit from consideration of a FEES at bedside next week. Will f/u.    Ayen Viviano, Katherene Ponto 11/25/2016, 9:52 AM

## 2016-11-26 ENCOUNTER — Inpatient Hospital Stay (HOSPITAL_COMMUNITY): Payer: Medicare Other

## 2016-11-26 LAB — GLUCOSE, CAPILLARY
Glucose-Capillary: 120 mg/dL — ABNORMAL HIGH (ref 65–99)
Glucose-Capillary: 120 mg/dL — ABNORMAL HIGH (ref 65–99)
Glucose-Capillary: 122 mg/dL — ABNORMAL HIGH (ref 65–99)
Glucose-Capillary: 129 mg/dL — ABNORMAL HIGH (ref 65–99)
Glucose-Capillary: 135 mg/dL — ABNORMAL HIGH (ref 65–99)
Glucose-Capillary: 157 mg/dL — ABNORMAL HIGH (ref 65–99)

## 2016-11-26 LAB — BASIC METABOLIC PANEL
ANION GAP: 6 (ref 5–15)
BUN: 13 mg/dL (ref 6–20)
CHLORIDE: 96 mmol/L — AB (ref 101–111)
CO2: 37 mmol/L — ABNORMAL HIGH (ref 22–32)
Calcium: 8.1 mg/dL — ABNORMAL LOW (ref 8.9–10.3)
Creatinine, Ser: 0.73 mg/dL (ref 0.44–1.00)
GFR calc non Af Amer: >60 mL/min (ref >60)
Glucose, Bld: 127 mg/dL — ABNORMAL HIGH (ref 65–99)
POTASSIUM: 3.7 mmol/L (ref 3.5–5.1)
SODIUM: 139 mmol/L (ref 135–145)

## 2016-11-26 LAB — CBC
HEMATOCRIT: 27.4 % — AB (ref 36.0–46.0)
Hemoglobin: 8.7 g/dL — ABNORMAL LOW (ref 12.0–15.0)
MCH: 30.4 pg (ref 26.0–34.0)
MCHC: 31.8 g/dL (ref 30.0–36.0)
MCV: 95.8 fL (ref 78.0–100.0)
Platelets: 312 10*3/uL (ref 150–400)
RBC: 2.86 MIL/uL — AB (ref 3.87–5.11)
RDW: 17.3 % — ABNORMAL HIGH (ref 11.5–15.5)
WBC: 8.7 10*3/uL (ref 4.0–10.5)

## 2016-11-26 LAB — HEPARIN LEVEL (UNFRACTIONATED): Heparin Unfractionated: 0.34 IU/mL (ref 0.30–0.70)

## 2016-11-26 NOTE — Progress Notes (Signed)
PT Cancellation Note  Patient Details Name: [name] MRN: [id number] DOB: Jan 08, 1946   Cancelled Treatment:    Reason Eval/Treat Not Completed: Patient not medically ready. Pt with report "I can't breathe" Respiratory to give breathing treatment. PT to return as able.   Kynadi Dragos M Demetres Prochnow 11/26/2016, 1:34 PM Kittie Plater, PT, DPT Pager #: [phone] Office #: [phone]

## 2016-11-26 NOTE — Progress Notes (Signed)
Speech Language Pathology Treatment: Dysphagia  Patient Details Name: VALERIA BOZA MRN: [id number] DOB: 24-Jul-1946 Today's Date: 11/26/2016 Time: 1188-6773 SLP Time Calculation (min) (ACUTE ONLY): 18 min  Assessment / Plan / Recommendation Clinical Impression  Reviewed MD notes over the past 48 hours. Patient continues to be tenous from a respiratory standpoint, requiring NT suctioning given inability to clear chest secretions. Upper airway appearing clear upon arrival to room with vocal quality remaining mild-moderately hoarse but not wet to indicate significant aspiration of secretions. Patient completed oral care with supervision cues for thoroughness, continues to complain on mouth pain due to oral sores. Diagnostic/therapeutic ice chips provided to assess swallowing function/readiness for repeat instrumental study and facilitate use of swallowing musculature. Min verbal cues provided for effortful swallow to strengthen musculature and facilitate pharyngeal clearance of bolus. Patient without overt s/s of aspiration with ice chip trials. Spouse present. Patient and spouse educated regarding progress and plan. Although patient appears to be making improvement in swallowing function since initial MBS 2/5, inability to clear the airway if aspiration were to occur, significantly increases risks with starting a po diet. Discussed with pulmonologist who recommends continuing NPO given reasons above. Will continue to f/u closely. Encouraged patient to continue using effortful swallow with secretions. Unfortunately, PTX makes use of RMT to facilitate improved swallowing function contraindicated. Given poor stamina, patient unlikely to be able to complete enough traditional pharyngeal strengthening exercises to make them beneficial at this time. Focus should be on pulmonary hygiene, improved respiratory function, and overall conditioning. Will continue to f/u.    HPI HPI: 71 y.o. 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 Initially was ambulatory in the ICU and out of bed without difficulty. She later had worsening hypoxia and respiratory status prompting initiation of BiPAP support and PCCM consultation.  Course complicated by shock thought to be sepsis from PNA but no positive cultures, AF with RVR, bilateral LE DVT.  Intubated for hypoxic respiratory failure 1/26.        SLP Plan  Continue with current plan of care     Recommendations  Diet recommendations: NPO Medication Administration: Via alternative means                Follow up Recommendations: Inpatient Rehab Plan: Continue with current plan of care       Peculiar Diaz, Mark [phone]    Gabriel Rainwater [name] 11/26/2016, 9:42 AM

## 2016-11-26 NOTE — Progress Notes (Signed)
CT surgery p.m. Rounds  Sitting up in bed comfortable Walking hallway 2-3 times today Remains on tube feedings No air leak but persistent clear chest tube drainage

## 2016-11-26 NOTE — Progress Notes (Addendum)
[facility] DAILY ICU PROGRESS NOTE                   [address]            [place] [address]          [phone]   21 Days Post-Op Procedure(s) (LRB): VIDEO BRONCHOSCOPY WITH INSERTION OF INTERBRONCHIAL VALVE (IBV) times three (N/A) Day 25:Post-Op Procedure(s) (LRB): VIDEO BRONCHOSCOPY (N/A) RIGHT VIDEO ASSISTED THORACOSCOPY /RIGHT UPPER LOBE LUNG RESECTION and Right Thoracotomy (Right) LYMPH NODE DISSECTION (Right  Total Length of Stay:  LOS: 28 days   Subjective: She states her breathing is not too good of late. She has multiple mouth sores.  Objective: Vital signs in last 24 hours: Temp:  [97.6 F (36.4 C)-99.3 F (37.4 C)] 98.8 F (37.1 C) (02/12 0258) Pulse Rate:  [69-90] 76 (02/12 0800) Cardiac Rhythm: Normal sinus rhythm (02/12 0736) Resp:  [11-32] 20 (02/12 0800) BP: (121-151)/(60-81) 139/69 (02/12 0800) SpO2:  [97 %-100 %] 100 % (02/12 0800) Weight:  [139 lb 8.8 oz (63.3 kg)] 139 lb 8.8 oz (63.3 kg) (02/12 0500)  Filed Weights   11/24/16 0600 11/25/16 0500 11/26/16 0500  Weight: 143 lb 4.8 oz (65 kg) 141 lb 8.6 oz (64.2 kg) 139 lb 8.8 oz (63.3 kg)    Weight change: -1 lb 15.8 oz (-0.9 kg)      Intake/Output from previous day: 02/11 0701 - 02/12 0700 In: 2202 [I.V.:492; NG/GT:1660; IV Piggyback:50] Out: 1995 [Urine:1695; Chest Tube:300]  Intake/Output this shift: Total I/O In: 70.5 [I.V.:20.5; NG/GT:50] Out: -   Current Meds: Scheduled Meds: . acetylcysteine  2 mL Nebulization Q6H  . amiodarone  200 mg Per Tube BID  . cefTAZidime (FORTAZ)  IV  1 g Intravenous Q8H  . chlorhexidine  15 mL Mouth Rinse BID  . Chlorhexidine Gluconate Cloth  6 each Topical Daily  . free water  200 mL Per Tube Q6H  . Gerhardt's butt cream   Topical BID  . insulin aspart  0-15 Units Subcutaneous Q4H  . ipratropium  0.5 mg Nebulization Q6H  . levalbuterol  0.63 mg Nebulization Q6H  . magic mouthwash  5 mL Oral TID  . mouth rinse  15 mL Mouth Rinse  [id number]  . pantoprazole sodium  40 mg Per Tube Q1200  . sodium chloride flush  10-40 mL Intracatheter Q12H   Continuous Infusions: . sodium chloride Stopped (11/15/16 1500)  . dextrose 10 mL/hr at 11/26/16 0800  . feeding supplement (OSMOLITE 1.5 CAL) 1,000 mL (11/26/16 0800)  . heparin 1,050 Units/hr (11/26/16 0800)   PRN Meds:.acetaminophen, fentaNYL (SUBLIMAZE) injection, levalbuterol, lip balm, potassium chloride (KCL MULTIRUN) 30 mEq in 265 mL IVPB, sodium chloride, sodium chloride flush  General appearance: Frail, alert, appears stated age and no distress.  Heart: RRR Lungs: Coarse breath sounds Abdomen: soft, non-tender; sporadic bowel sounds Extremities: Mild LE edema Wound: Clean and dry Chest tubes: to water seal and no air leak  Lab Results: CBC:  Recent Labs  11/24/16 0415 11/26/16 0437  WBC 9.6 8.7  HGB 8.2* 8.7*  HCT 26.4* 27.4*  PLT 307 312   BMET:   Recent Labs  11/24/16 0415 11/26/16 0437  NA 137 139  K 3.6 3.7  CL 91* 96*  CO2 37* 37*  GLUCOSE 158* 127*  BUN 19 13  CREATININE 0.83 0.73  CALCIUM 7.9* 8.1*    CMET: Lab Results  Component Value Date   WBC 8.7 11/26/2016   HGB  8.7 (L) 11/26/2016   HCT 27.4 (L) 11/26/2016   PLT 312 11/26/2016   GLUCOSE 127 (H) 11/26/2016   TRIG 137 11/12/2016   ALT 25 11/11/2016   AST 32 11/11/2016   NA 139 11/26/2016   K 3.7 11/26/2016   CL 96 (L) 11/26/2016   CREATININE 0.73 11/26/2016   BUN 13 11/26/2016   CO2 37 (H) 11/26/2016   INR 1.04 11/11/2016      PT/INR: No results for input(s): LABPROT, INR in the last 72 hours. Radiology: Dg Chest Port 1 View  Result Date: 11/26/2016 CLINICAL DATA:  Shortness of breath. EXAM: PORTABLE CHEST 1 VIEW COMPARISON:  11/25/2016. FINDINGS: Feeding tube, right IJ line, right chest tube in stable position. No pneumothorax. Postsurgical changes right lung. Persistent dense atelectasis right base. Small right pleural effusion. No change. No pneumothorax. IMPRESSION:  1. Lines and tubes including right chest tube in stable position. No pneumothorax. 2. Postsurgical changes right lung with persistent dense atelectasis right base. Small right pleural effusion again noted. No significant change from prior exam. Electronically Signed   By: Marcello Moores  Register   On: 11/26/2016 07:21   Dg Chest Port 1 View  Result Date: 11/25/2016 CLINICAL DATA:  Atelectasis EXAM: PORTABLE CHEST 1 VIEW COMPARISON:  11/24/2016 FINDINGS: RIGHT apical chest tube. Interval decrease in pleural fluid in the RIGHT hemithorax. There is persistent volume loss in the RIGHT hemithorax, airspace disease and apical capping. Feeding tube and central venous line are unchanged. LEFT lung clear. IMPRESSION: 1. Decreased pleural fluid on the RIGHT.  Chest tube in place. 2. Airspace disease and volume loss in the RIGHT hemithorax. Electronically Signed   By: Suzy Bouchard M.D.   On: 11/25/2016 09:15     Assessment/Plan:   Procedure(s) (LRB): VIDEO BRONCHOSCOPY WITH INSERTION OF INTERBRONCHIAL VALVE (IBV) times three (N/A) Day Post-Op Procedure(s) (LRB): VIDEO BRONCHOSCOPY WITH INSERTION OF INTERBRONCHIAL VALVE (IBV) times three (N/A) Post-Op Procedure(s) (LRB): VIDEO BRONCHOSCOPY (N/A) RIGHT VIDEO ASSISTED THORACOSCOPY /RIGHT UPPER LOBE LUNG RESECTION and Right Thoracotomy (Right) LYMPH NODE DISSECTION (Right    1.CV-SR in the 80's this am. On Amiodarone 200 mg bid per tube. On Heparin drip for DVTs, previous a fib. 2.Pulmonary-s/p RUL 1/18 and s/p EBV valves on 01/22 for persistent air leak. Chest tubes with 300 cc of yellow pleural fluid last 24 hours . Chest tubes to suction and with no air leak. On 2 liters of oxygen via [place]. CXR this am appears stable (no pneumothorax, atelectasis) Continue Mucomyst,flutter valve and incentive spirometer.  3. GI-Cortrak came out over weekend-it is now in stomach. Will need repeat swallow study eventually. NPO status.  TFs at 50 ml/hr.  4. ABL anemia-Last H  and H this am 8.7 and 27.4 5.ID-On  Tressie Ellis (concern for new HCAP) 6. Foley remains-will discuss with surgeon about when to remove  Arnoldo Lenis 11/26/2016 8:13 AM   Patient seen and examined, agree with above She is day 9 of IV antibiotics- continue for 10 day course Had a small air leak with cough when I examined her- keep CT to water seal CXR aeration not quite as good as yesterday. Encourage cough, flutter etc, continue mucomyst  Raeleen Winstanley C. Roxan Hockey, MD [facility] [phone]

## 2016-11-26 NOTE — Progress Notes (Signed)
PULMONARY / CRITICAL CARE MEDICINE   Name: [name] MRN: [id number] DOB: December 05, 1945    ADMISSION DATE:  10/29/2016 CONSULTATION DATE:  11/07/2016  REFERRING MD:  Lanelle Bal, M.D. / CVTS  CHIEF COMPLAINT:  Acute Hypoxic Respiratory Failure  BRIEF SUMMARY:  71 y.o. 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 Initially was ambulatory in the ICU and out of bed without difficulty. She later had worsening hypoxia and  Intubated for hypoxic respiratory failure 9/51  Course complicated by shock thought to be sepsis from PNA but no positive cultures, AF with RVR, bilateral LE DVT.   SUBJECTIVE:  CT to water seal couple of days. Still with some "leak" with coughing (-) inc in SOB Generalized weakness but better. Tolerating TF Cough is better.  Looks stronger compare to 1-2 weeks ago when I saw her last     VITAL SIGNS: BP 139/69   Pulse 76   Temp 98.8 F (37.1 C) (Oral)   Resp 20   Ht '5\' 5"'$  (1.651 m)   Wt 63.3 kg (139 lb 8.8 oz)   SpO2 100%   BMI 23.22 kg/m   HEMODYNAMICS:    VENTILATOR SETTINGS:    INTAKE / OUTPUT: I/O last 3 completed shifts: In: 3428.5 [I.V.:758.5; NG/GT:2570; IV Piggyback:100] Out: 8841 [Urine:2670; Chest Tube:590]  PHYSICAL EXAMINATION: General:  Elderly female in NAD, sitting in chair. Comfortable.  HEENT: MM pink/moist, right lip lesion with small amt bleeding Neuro: AAOx4, speech clear, MAE, generalized weakness CV: s1s2 rrr, no m/r/g PULM: even/non-labored, diminished on R, chest tube in place in water seal. Some rhonchi in RULF. Left lung clear.  YS:AYTK, non-tender, bsx4 active  Extremities: warm/dry, generalized 1-2+ edema  Skin: no rashes or lesions     LABS:  Recent Labs Lab 11/23/16 0345 11/24/16 0415 11/26/16 0437  HGB 8.0* 8.2* 8.7*  HCT 25.3* 26.4* 27.4*   WBC 11.4* 9.6 8.7  PLT 292 307 312    Recent Labs Lab 11/21/16 0415 11/22/16 0400 11/23/16 0345 11/24/16 0415 11/26/16 0437  NA 145 141 139 137 139  K 3.3* 3.3* 3.5 3.6 3.7  CL 106 102 97* 91* 96*  CO2 32 34* 36* 37* 37*  GLUCOSE 133* 136* 146* 158* 127*  BUN 26* 26* 25* 19 13  CREATININE 0.97 0.83 0.86 0.83 0.73  CALCIUM 8.1* 7.9* 7.9* 7.9* 8.1*  MG 1.7 1.5*  --  1.7  --   PHOS  --  1.8*  --  1.7*  --     Recent Labs Lab 11/22/16 1415  PHART 7.389  PCO2ART 59.3*  PO2ART 60.9*  HCO3 35.1*  O2SAT 90.4    STUDIES:  PFT 08/24/16 >> FVC 2.54 L (89%) FEV1 1.22 L (57%) FEV1/FVC 0.48 FEF 25-75 0.44 L (24%) negative bronchial dilator response TLC 5.19 L (120%) RV 155% ERV 76% DLCO uncorrected 52% TTE 1/24 >>  Poor quality study. Unable to assess RV. No pericardial effusion. LV with mild LVH but normal in size. EF 60-65%. Unable to assess wall motion. Grade 1 diastolic dysfunction. BILAT LE VENOUS DUPLEX 1/24 >>  There is evidence of acute deep vein thrombosis involving the peroneal veins of the right and left lower extremities. 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  Changed to argatroban. 1/30 - still with air leak, weaning well 1/31 - extubated 2/03 - ABX restarted  2/05 - CXR with R volume loss with rightward shift, chest tubes in place   LINES/TUBES: Right Chest Tubes 1/15 >> R IJ CLV 1/24 >> Foley 1/24 >>   ASSESSMENT /  PLAN:  72 y.o. female admitted for right upper lobectomy completed on 1/15. Patient had subsequent persistent air leak. Patient's shock and acute hypoxic respiratory failure likely due to evolving sepsis from PNA (NOS). Source likely from her lung given recent surgery.  Course complicated by BPF, AF-RVR & DVT.  Patient was extubated on 1/31 and is slow to improve.    PULMONARY A: S/P Acute Hypoxic Respiratory Failure secondary to PNA, treated.  Concern for new HCAP 2/3 RUL NSCLCA - s/p RULobectomy  Persistent Right Pneumothorax & Air Leak post-lobectomy - Chest tubes in place & Endobronchial valves placed 1/22 Significant R lung atx and volume loss Moderate to Severe COPD P:   Continue aggressive pulmonary hygiene - IS, mobilize, flutter  Continue mucomyst nebs to aide in secretion clearance Continue with atrovent and xopenex q6 hrs.  Trend CXR Wean O2 as needed for sats > 88- 90% Chest tube mgmt per CVTS Pt is on Ceftazidime for concern for new HCAP, started on 2/3.  CXR on 2/12 looks better compared to 2/5.  Less atelectasis. Continue to observe.  I am not sure if pt will need a rpt chest ct scan to better delineate her lung parenchyma.  I think will be a combination of atelectasis, mucus plug, PNA, and pleural effusion for which we are all addressing at the moment.  May need a "clean up" bronchoscopy but I am afraid she may not tolerate it and will end up on the ventilator.    CARDIOVASCULAR A:  Afib with RVR , converted sinus 1/31 Bilateral Calf Vein/Peroneal DVT H/O HTN, HLD  P:  Amiodarone 200 mg BID  Heparin gtt per pharmacy   RENAL A:   Acute Renal Failure, resolved Hypernatremia, improved.  P:  Free water 200 ml Q6 Suggest to d/c D5 water.   GASTROINTESTINAL A:   Nausea and Vomiting - resolved Dysphagia Severe malnutrition P:   Continue TF  PPI for SUP  Check LFTs and albumin in am.   HEMATOLOGIC A:   Bilateral peroneal DVT Thrombocytopenia - resolved,  argatroban 1/28 due to thrombocytopenia, HIT neg ELISA, back on heparin drip P: Heparin gtt  Trend CBC  Monitor for evidence of bleeding  INFECTIOUS A:   S/P Septic Shock secondary to PNA Concern for new HCAP (2/3) Leucocytosis Small amt candida in sputum - S/P 4 days antifungal  P:   Continue Ceftaz > defer to surgery re: stop date.  Follow CBC, temp curve  ENDOCRINE A:   Hyperglycemia controlled  P:   Trend CBG SSI   NEUROLOGIC A:   Acute Metabolic Encephalopathy - resolved Post Operative Pain   P:   PT, OT, SLP  Minimize sedation as able    FAMILY  - Updates:  Patient updated at bedside.  Monica Becton, MD 11/26/2016, 8:51 AM [facility] Pager [phone] After 3 pm or if no answer, call [phone]

## 2016-11-26 NOTE — Progress Notes (Signed)
ANTICOAGULATION CONSULT NOTE - FOLLOW UP  Pharmacy Consult for:  Heparin Indication: DVT and atrial fibrillation  Allergies  Allergen Reactions  . Nickel Rash    Patient Measurements: Height: '5\' 5"'$  (165.1 cm) Weight: 139 lb 8.8 oz (63.3 kg) IBW/kg (Calculated) : 57 Heparin Dosing Weight: 69 kg  Vital Signs: Temp: 98 F (36.7 C) (02/12 0800) Temp Source: Oral (02/12 0800) BP: 132/61 (02/12 1000) Pulse Rate: 67 (02/12 1000)  Labs:  Recent Labs  11/24/16 0415 11/25/16 0422 11/26/16 0437 11/26/16 0438  HGB 8.2*  --  8.7*  --   HCT 26.4*  --  27.4*  --   PLT 307  --  312  --   HEPARINUNFRC 0.30 0.42  --  0.34  CREATININE 0.83  --  0.73  --     Estimated Creatinine Clearance: 58 mL/min (by C-G formula based on SCr of 0.73 mg/dL).   Assessment: 32 yoF with new bilateral peroneal DVT and AFib to continue on IV heparin.  Negative for HIT.  Heparin level remains therapeutic.  CBC stable; no bleeding documented.   Goal of Therapy:  Heparin level 0.3- 0.7 units/mL Monitor platelets by anticoagulation protocol: Yes    Plan:  - Continue heparin drip at 1050 units/hr - Daily heparin level and CBC - Monitor for s/sx bleeding   Rhyder Koegel D. Mina Marble, PharmD, BCPS Pager:  [phone] 11/26/2016, 11:13 AM

## 2016-11-26 NOTE — Plan of Care (Signed)
Problem: Respiratory: Goal: Respiratory status will improve Outcome: Progressing Pt able to cough up secretions. Using IS, flutter valve, and NTS per RT for pulmonary toileting

## 2016-11-26 NOTE — Progress Notes (Signed)
Physical Therapy Treatment Patient Details Name: [name] MRN: [id number] DOB: 1946/07/02 Today's Date: 11/26/2016    History of Present Illness 71 y.o. 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 Initially was ambulatory in the ICU and out of bed without difficulty. She later had worsening hypoxia and  Intubated for hypoxic respiratory failure 2/53  Course complicated by shock thought to be sepsis from PNA but no positive cultures, AF with RVR, bilateral LE DVT.  Patient extubated 11/14/16.    PT Comments    Pt progressing well. Pt requires less physical assist with mobility, but continues to be limited by fatigue. Pt requires max v/c for energy conservation and proper breathing technique with mobility. Current d/c recommendation continues to be appropriate when medically ready. Will continue to follow.   Follow Up Recommendations  CIR;Supervision for mobility/OOB     Equipment Recommendations  None recommended by PT    Recommendations for Other Services       Precautions / Restrictions Precautions Precautions: Fall Restrictions Weight Bearing Restrictions: No    Mobility  Bed Mobility Overal bed mobility: Needs Assistance Bed Mobility: Supine to Sit     Supine to sit: Min assist     General bed mobility comments: HOB elevated; assist for line management; v/c for technique; increased time  Transfers Overall transfer level: Needs assistance Equipment used:  (eva walker) Transfers: Sit to/from Stand Sit to Stand: Min assist;+2 safety/equipment         General transfer comment: v/c for knees over toes; increased time; minA x 1 for physical assist  Ambulation/Gait Ambulation/Gait assistance: Min assist;+2 safety/equipment Ambulation Distance (Feet): 75 Feet Assistive device:  (eva walker) Gait  Pattern/deviations: Step-through pattern;Decreased stride length;Trunk flexed;Wide base of support Gait velocity: decreased Gait velocity interpretation: Below normal speed for age/gender General Gait Details: poor foot clearance bilaterally; slow speed; max v/c for breathing technique   Stairs            Wheelchair Mobility    Modified Rankin (Stroke Patients Only)       Balance Overall balance assessment: Needs assistance Sitting-balance support: No upper extremity supported;Feet supported Sitting balance-Leahy Scale: Good Sitting balance - Comments: pt sat EOB x 3 min; able to shift weight and move hips square to EOB   Standing balance support: Bilateral upper extremity supported Standing balance-Leahy Scale: Poor Standing balance comment: heavily relies upon eva walker                    Cognition Arousal/Alertness: Awake/alert Behavior During Therapy: Anxious Overall Cognitive Status: Within Functional Limits for tasks assessed                 General Comments: A&O x 4; anxious with SOB during ambulation    Exercises      General Comments General comments (skin integrity, edema, etc.): chest tube intact      Pertinent Vitals/Pain Pain Assessment: No/denies pain    Home Living                      Prior Function            PT Goals (current goals can now be found in the care plan section) Progress towards PT goals: Progressing toward goals    Frequency    Min 3X/week      PT Plan  Current plan remains appropriate    Co-evaluation             End of Session Equipment Utilized During Treatment: Oxygen Activity Tolerance: Patient limited by fatigue Patient left: in chair;with call bell/phone within reach;with family/visitor present     Time: 5848-3507 PT Time Calculation (min) (ACUTE ONLY): 30 min  Charges:  $Gait Training: 23-37 mins                    G CodesTracie Harrier 20-Dec-2016, 4:52  PM  Tracie Harrier, SPT Acute Rehab SPT [phone]

## 2016-11-27 ENCOUNTER — Inpatient Hospital Stay (HOSPITAL_COMMUNITY): Payer: Medicare Other

## 2016-11-27 LAB — GLUCOSE, CAPILLARY
Glucose-Capillary: 109 mg/dL — ABNORMAL HIGH (ref 65–99)
Glucose-Capillary: 124 mg/dL — ABNORMAL HIGH (ref 65–99)
Glucose-Capillary: 125 mg/dL — ABNORMAL HIGH (ref 65–99)
Glucose-Capillary: 133 mg/dL — ABNORMAL HIGH (ref 65–99)
Glucose-Capillary: 143 mg/dL — ABNORMAL HIGH (ref 65–99)
Glucose-Capillary: 94 mg/dL (ref 65–99)

## 2016-11-27 LAB — HEPATIC FUNCTION PANEL
ALBUMIN: 1.9 g/dL — AB (ref 3.5–5.0)
ALK PHOS: 56 U/L (ref 38–126)
ALT: 21 U/L (ref 14–54)
AST: 20 U/L (ref 15–41)
BILIRUBIN TOTAL: 0.6 mg/dL (ref 0.3–1.2)
Bilirubin, Direct: <0.1 mg/dL — ABNORMAL LOW (ref 0.1–0.5)
Total Protein: 4.9 g/dL — ABNORMAL LOW (ref 6.5–8.1)

## 2016-11-27 LAB — HEPARIN LEVEL (UNFRACTIONATED): Heparin Unfractionated: 0.45 IU/mL (ref 0.30–0.70)

## 2016-11-27 NOTE — Progress Notes (Signed)
Patient unavailable for CPT. RT will check back.

## 2016-11-27 NOTE — Progress Notes (Signed)
Patient ID: [name], female   DOB: 1945/11/20, 71 y.o.   MRN: [id number]  SICU Evening Rounds:  Hemodynamically stable sats 99%  Urine output ok  CT output decreasing.

## 2016-11-27 NOTE — Progress Notes (Signed)
ANTICOAGULATION CONSULT NOTE - FOLLOW UP  Pharmacy Consult for:  Heparin Indication: DVT and atrial fibrillation  Allergies  Allergen Reactions  . Nickel Rash    Patient Measurements: Height: '5\' 5"'$  (165.1 cm) Weight: 139 lb 12.4 oz (63.4 kg) IBW/kg (Calculated) : 57 Heparin Dosing Weight: 69 kg  Vital Signs: Temp: 98.9 F (37.2 C) (02/13 0800) Temp Source: Oral (02/13 0800) BP: 128/73 (02/13 0900) Pulse Rate: 85 (02/13 0900)  Labs:  Recent Labs  11/25/16 0422 11/26/16 0437 11/26/16 0438 11/27/16 0345  HGB  --  8.7*  --   --   HCT  --  27.4*  --   --   PLT  --  312  --   --   HEPARINUNFRC 0.42  --  0.34 0.45  CREATININE  --  0.73  --   --     Estimated Creatinine Clearance: 58 mL/min (by C-G formula based on SCr of 0.73 mg/dL).   Assessment: 16 yoF with new bilateral peroneal DVT and AFib to continue on IV heparin.  Negative for HIT.  Heparin level remains therapeutic and stable.  CBC stable; no bleeding documented.   Goal of Therapy:  Heparin level 0.3- 0.7 units/mL Monitor platelets by anticoagulation protocol: Yes    Plan:  - Continue heparin drip at 1050 units/hr - Daily heparin level and CBC - Monitor for s/sx bleeding   Cuca Benassi D. Mina Marble, PharmD, BCPS Pager:  [phone] 11/27/2016, 10:23 AM

## 2016-11-27 NOTE — Progress Notes (Signed)
PULMONARY / CRITICAL CARE MEDICINE   Name: LAYNA ROEPER MRN: [id number] DOB: 1945-11-27    ADMISSION DATE:  10/29/2016 CONSULTATION DATE:  11/07/2016  REFERRING MD:  Lanelle Bal, M.D. / CVTS  CHIEF COMPLAINT:  Acute Hypoxic Respiratory Failure  BRIEF SUMMARY:  71 y.o. 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 Initially was ambulatory in the ICU and out of bed without difficulty. She later had worsening hypoxia and  Intubated for hypoxic respiratory failure 4/00  Course complicated by shock thought to be sepsis from PNA but no positive cultures, AF with RVR, bilateral LE DVT.   SUBJECTIVE:  No issues overnight. Walked at the hallway yesterday and today.  Generalized weakness but better. Tolerating TF Cough is better.     VITAL SIGNS: BP (!) 132/101 (BP Location: Left Arm)   Pulse 80   Temp 97.9 F (36.6 C) (Oral)   Resp (!) 24   Ht '5\' 5"'$  (1.651 m)   Wt 63.4 kg (139 lb 12.4 oz)   SpO2 100%   BMI 23.26 kg/m   HEMODYNAMICS:    VENTILATOR SETTINGS:    INTAKE / OUTPUT: I/O last 3 completed shifts: In: 8676 [I.V.:758; NG/GT:2320; IV Piggyback:415] Out: 1950 [Urine:3470; Stool:1; Chest Tube:380]  PHYSICAL EXAMINATION: General:  Elderly female in NAD, sitting in chair. Comfortable.  HEENT: MM pink/moist, right lip lesion with small amt bleeding Neuro: AAOx4, speech clear, MAE, generalized weakness CV: s1s2 rrr, no m/r/g PULM: even/non-labored, diminished on R, chest tube in place in water seal. Less rhonchi in L lung.  DT:OIZT, non-tender, bsx4 active  Extremities: warm/dry, generalized 1-2+ edema  Skin: no rashes or lesions     LABS:  Recent Labs Lab 11/23/16 0345 11/24/16 0415 11/26/16 0437  HGB 8.0* 8.2* 8.7*  HCT 25.3* 26.4* 27.4*  WBC 11.4* 9.6 8.7  PLT 292 307 312    Recent Labs Lab  11/21/16 0415 11/22/16 0400 11/23/16 0345 11/24/16 0415 11/26/16 0437  NA 145 141 139 137 139  K 3.3* 3.3* 3.5 3.6 3.7  CL 106 102 97* 91* 96*  CO2 32 34* 36* 37* 37*  GLUCOSE 133* 136* 146* 158* 127*  BUN 26* 26* 25* 19 13  CREATININE 0.97 0.83 0.86 0.83 0.73  CALCIUM 8.1* 7.9* 7.9* 7.9* 8.1*  MG 1.7 1.5*  --  1.7  --   PHOS  --  1.8*  --  1.7*  --     Recent Labs Lab 11/22/16 1415  PHART 7.389  PCO2ART 59.3*  PO2ART 60.9*  HCO3 35.1*  O2SAT 90.4    STUDIES:  PFT 08/24/16 >> FVC 2.54 L (89%) FEV1 1.22 L (57%) FEV1/FVC 0.48 FEF 25-75 0.44 L (24%) negative bronchial dilator response TLC 5.19 L (120%) RV 155% ERV 76% DLCO uncorrected 52% TTE 1/24 >>  Poor quality study. Unable to assess RV. No pericardial effusion. LV with mild LVH but normal in size. EF 60-65%. Unable to assess wall motion. Grade 1 diastolic dysfunction. BILAT LE VENOUS DUPLEX 1/24 >>  There is evidence of acute deep vein thrombosis involving the peroneal veins of the right and left lower extremities. 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  Changed to argatroban. 1/30 - still with air leak, weaning well 1/31 - extubated 2/03 - ABX restarted  2/05 - CXR with R volume loss with rightward shift, chest tubes in place   LINES/TUBES: Right Chest Tubes 1/15 >> R IJ CLV 1/24 >> Foley 1/24 >>   ASSESSMENT / PLAN:  71 y.o. female admitted for right upper lobectomy  completed on 1/15. Patient had subsequent persistent air leak. Patient's shock and acute hypoxic respiratory failure likely due to evolving sepsis from PNA (NOS). Source likely from her lung given recent surgery.  Course complicated by BPF, AF-RVR & DVT.  Patient was extubated on 1/31 and is slow to improve.    PULMONARY A: S/P Acute Hypoxic Respiratory Failure secondary to PNA, treated.  Concern for new HCAP 2/3. Clinically improved.  RUL NSCLCA - s/p RULobectomy  Persistent Right Pneumothorax & Air Leak post-lobectomy - Chest tubes in place & Endobronchial valves placed 1/22 Significant R lung atx and volume loss Moderate to Severe COPD P:   Continue aggressive pulmonary hygiene - IS, mobilize, flutter  Continue mucomyst nebs to aide in secretion clearance Continue with atrovent and xopenex q6 hrs.  Wean O2 as needed for sats > 88- 90% Chest tube mgmt per CVTS Pt is on Ceftazidime for concern for new HCAP, started on 2/3. Noted plans to d/c today. Agree,  Slowly improving.   CARDIOVASCULAR A:  Afib with RVR , converted sinus 1/31 Bilateral Calf Vein/Peroneal DVT H/O HTN, HLD  P:  Amiodarone 200 mg BID  Heparin gtt per pharmacy   RENAL A:   Acute Renal Failure, resolved Hypernatremia, improved.  P:  Free water 200 ml Q6 Suggest to d/c D5 water.   GASTROINTESTINAL A:   Nausea and Vomiting - resolved Dysphagia Severe malnutrition P:   Continue TF  PPI for SUP    HEMATOLOGIC A:   Bilateral peroneal DVT Thrombocytopenia - resolved, argatroban 1/28 due to thrombocytopenia, HIT neg ELISA, back on heparin drip P: Heparin gtt  Trend CBC  Monitor for evidence of bleeding  INFECTIOUS A:   S/P Septic Shock secondary to PNA Concern for new HCAP (2/3) Leucocytosis Small amt candida in sputum - S/P 4 days antifungal  P:   Continue Ceftaz. Plan to d/c on 2/13. Follow CBC, temp curve  ENDOCRINE A:   Hyperglycemia controlled  P:   Trend CBG SSI    NEUROLOGIC A:   Acute Metabolic Encephalopathy - resolved Post Operative Pain   P:   PT, OT, SLP  Minimize sedation as able    FAMILY  - Updates:  Patient and husband updated at bedside.    Monica Becton, MD 11/27/2016, 8:42 AM [facility] Pager [phone] After 3 pm or if no answer, call [phone]

## 2016-11-27 NOTE — Progress Notes (Addendum)
TCTS DAILY ICU PROGRESS NOTE                   [address]            [place] [address]          [phone]   22 Days Post-Op Procedure(s) (LRB): VIDEO BRONCHOSCOPY WITH INSERTION OF INTERBRONCHIAL VALVE (IBV) times three (N/A) Day 25:Post-Op Procedure(s) (LRB): VIDEO BRONCHOSCOPY (N/A) RIGHT VIDEO ASSISTED THORACOSCOPY /RIGHT UPPER LOBE LUNG RESECTION and Right Thoracotomy (Right) LYMPH NODE DISSECTION (Right  Total Length of Stay:  LOS: 29 days   Subjective: For the most part, her breathing has slowly improved. She does has multiple mouth sores and has been getting oral care.  Objective: Vital signs in last 24 hours: Temp:  [97.9 F (36.6 C)-99 F (37.2 C)] 97.9 F (36.6 C) (02/13 0400) Pulse Rate:  [67-83] 78 (02/13 0700) Cardiac Rhythm: Normal sinus rhythm (02/13 0400) Resp:  [16-33] 25 (02/13 0700) BP: (113-143)/(58-107) 117/74 (02/13 0700) SpO2:  [96 %-100 %] 97 % (02/13 0746) Weight:  [139 lb 12.4 oz (63.4 kg)] 139 lb 12.4 oz (63.4 kg) (02/13 0500)  Filed Weights   11/25/16 0500 11/26/16 0500 11/27/16 0500  Weight: 141 lb 8.6 oz (64.2 kg) 139 lb 8.8 oz (63.3 kg) 139 lb 12.4 oz (63.4 kg)    Weight change: 3.5 oz (0.1 kg)      Intake/Output from previous day: 02/12 0701 - 02/13 0700 In: 2322 [I.V.:512; NG/GT:1660; IV Piggyback:150] Out: 2901 [Urine:2620; Stool:1; Chest Tube:280]  Intake/Output this shift: No intake/output data recorded.  Current Meds: Scheduled Meds: . acetylcysteine  2 mL Nebulization Q6H  . amiodarone  200 mg Per Tube BID  . cefTAZidime (FORTAZ)  IV  1 g Intravenous Q8H  . chlorhexidine  15 mL Mouth Rinse BID  . Chlorhexidine Gluconate Cloth  6 each Topical Daily  . free water  200 mL Per Tube Q6H  . Gerhardt's butt cream   Topical BID  . insulin aspart  0-15 Units Subcutaneous Q4H  . ipratropium  0.5 mg Nebulization Q6H  . levalbuterol  0.63 mg Nebulization Q6H  . magic mouthwash  5 mL Oral TID  . mouth  rinse  15 mL Mouth Rinse q12n4p  . pantoprazole sodium  40 mg Per Tube Q1200  . sodium chloride flush  10-40 mL Intracatheter Q12H   Continuous Infusions: . sodium chloride Stopped (11/15/16 1500)  . dextrose 10 mL/hr at 11/27/16 0700  . feeding supplement (OSMOLITE 1.5 CAL) 1,000 mL (11/27/16 0700)  . heparin 1,050 Units/hr (11/27/16 0700)   PRN Meds:.acetaminophen, fentaNYL (SUBLIMAZE) injection, levalbuterol, lip balm, potassium chloride (KCL MULTIRUN) 30 mEq in 265 mL IVPB, sodium chloride, sodium chloride flush   Heart: RRR Lungs: Diminished on the right and clear on the left Abdomen: soft, non-tender; sporadic bowel sounds Extremities: Mild LE edema Wound: Clean and dry Chest tube: to water seal and no air leak  Lab Results: CBC:  Recent Labs  11/26/16 0437  WBC 8.7  HGB 8.7*  HCT 27.4*  PLT 312   BMET:   Recent Labs  11/26/16 0437  NA 139  K 3.7  CL 96*  CO2 37*  GLUCOSE 127*  BUN 13  CREATININE 0.73  CALCIUM 8.1*    CMET: Lab Results  Component Value Date   WBC 8.7 11/26/2016   HGB 8.7 (L) 11/26/2016   HCT 27.4 (L) 11/26/2016   PLT 312 11/26/2016   GLUCOSE 127 (H) 11/26/2016  TRIG 137 11/12/2016   ALT 21 11/27/2016   AST 20 11/27/2016   NA 139 11/26/2016   K 3.7 11/26/2016   CL 96 (L) 11/26/2016   CREATININE 0.73 11/26/2016   BUN 13 11/26/2016   CO2 37 (H) 11/26/2016   INR 1.04 11/11/2016      PT/INR: No results for input(s): LABPROT, INR in the last 72 hours. Radiology: Dg Chest Port 1 View  Result Date: 11/27/2016 CLINICAL DATA:  71 year old female with chest tube. Shortness breath. Subsequent encounter. EXAM: PORTABLE CHEST 1 VIEW COMPARISON:  11/26/2016 FINDINGS: Right chest tube remains in place. Tiny amount a gas within right lung apical pleural changes may represent tiny loculated hydropneumothorax without change. Minimally improved aeration residual right lung base with persistent consolidation majority of residual right lung.  Heart size difficult adequately assess. Calcified aorta. Right central line tip unchanged in position. Feeding tube in place. IMPRESSION: Right chest tube remains in place. Tiny amount a gas within right lung apical pleural changes may represent tiny loculated hydropneumothorax without change. Minimally improved aeration residual right lung base. Electronically Signed   By: Genia Del M.D.   On: 11/27/2016 07:26     Assessment/Plan:   Procedure(s) (LRB): VIDEO BRONCHOSCOPY WITH INSERTION OF INTERBRONCHIAL VALVE (IBV) times three (N/A) Day Post-Op Procedure(s) (LRB): VIDEO BRONCHOSCOPY WITH INSERTION OF INTERBRONCHIAL VALVE (IBV) times three (N/A) Post-Op Procedure(s) (LRB): VIDEO BRONCHOSCOPY (N/A) RIGHT VIDEO ASSISTED THORACOSCOPY /RIGHT UPPER LOBE LUNG RESECTION and Right Thoracotomy (Right) LYMPH NODE DISSECTION (Right    1.CV-SR in the 80's this am. On Amiodarone 200 mg bid per tube. On Heparin drip for DVTs, previous a fib. 2.Pulmonary-s/p RUL 1/18 and s/p EBV valves on 01/22 for persistent air leak. Chest tubes with 280 cc of yellow pleural fluid last 24 hours . Chest tube to water seal  and with no air leak. On 2 liters of oxygen via [place]. CXR this am appears stable. Continue Mucomyst,flutter valve and incentive spirometer.  3. GI-Cortrak came out over weekend-it is now in stomach. Will order swallow study for today. NPO status.  TFs at 50 ml/hr.  4. ABL anemia-Last H and H this am 8.7 and 27.4 5.ID-On  Tressie Ellis (day10/10)-concern for new HCAP 6. Foley will be removed today  ZIMMERMAN,DONIELLE M PA-C 11/27/2016 8:04 AM   Patient seen and examined, agree with above She looks better this AM Good cough but less productive CXR about the same, but markedly better than over the weekend I think she is ready for a repeat swallow eval Last day of antibiotics, then stop and observe, will have a low threshold to restart  Remo Lipps C. Roxan Hockey, MD [facility] [phone]

## 2016-11-28 ENCOUNTER — Inpatient Hospital Stay (HOSPITAL_COMMUNITY): Payer: Medicare Other

## 2016-11-28 LAB — GLUCOSE, CAPILLARY
Glucose-Capillary: 112 mg/dL — ABNORMAL HIGH (ref 65–99)
Glucose-Capillary: 114 mg/dL — ABNORMAL HIGH (ref 65–99)
Glucose-Capillary: 127 mg/dL — ABNORMAL HIGH (ref 65–99)
Glucose-Capillary: 128 mg/dL — ABNORMAL HIGH (ref 65–99)
Glucose-Capillary: 154 mg/dL — ABNORMAL HIGH (ref 65–99)
Glucose-Capillary: 99 mg/dL (ref 65–99)

## 2016-11-28 LAB — CBC
HEMATOCRIT: 26.6 % — AB (ref 36.0–46.0)
HEMOGLOBIN: 8.5 g/dL — AB (ref 12.0–15.0)
MCH: 30.4 pg (ref 26.0–34.0)
MCHC: 32 g/dL (ref 30.0–36.0)
MCV: 95 fL (ref 78.0–100.0)
Platelets: 301 10*3/uL (ref 150–400)
RBC: 2.8 MIL/uL — AB (ref 3.87–5.11)
RDW: 17.5 % — ABNORMAL HIGH (ref 11.5–15.5)
WBC: 9.2 10*3/uL (ref 4.0–10.5)

## 2016-11-28 LAB — BASIC METABOLIC PANEL
ANION GAP: 8 (ref 5–15)
BUN: 11 mg/dL (ref 6–20)
CALCIUM: 8.4 mg/dL — AB (ref 8.9–10.3)
CHLORIDE: 97 mmol/L — AB (ref 101–111)
CO2: 33 mmol/L — AB (ref 22–32)
Creatinine, Ser: 0.79 mg/dL (ref 0.44–1.00)
GFR calc non Af Amer: >60 mL/min (ref >60)
GLUCOSE: 133 mg/dL — AB (ref 65–99)
Potassium: 3.7 mmol/L (ref 3.5–5.1)
Sodium: 138 mmol/L (ref 135–145)

## 2016-11-28 LAB — HEPARIN LEVEL (UNFRACTIONATED): Heparin Unfractionated: 0.41 IU/mL (ref 0.30–0.70)

## 2016-11-28 MED ORDER — RESOURCE THICKENUP CLEAR PO POWD
ORAL | Status: DC | PRN
  Filled 2016-11-28: qty 125

## 2016-11-28 NOTE — Progress Notes (Signed)
Modified Barium Swallow Progress Note  Patient Details  Name: [name] MRN: [id number] Date of Birth: 1946/05/15  Today's Date: 11/28/2016  Modified Barium Swallow completed.  Full report located under Chart Review in the Imaging Section.  Brief recommendations include the following:  Clinical Impression  Patient presents with a moderate dysphagia with oral, pharyngeal, and esophageal components, improving however from previous study. mildly delayed oral transit of bolus noted due to generalized weakness and oral pain (buccal, labial ulcerations). Patient with delayed swallow initiation, base of tongue weakness, decreased epiglottic deflection, laryngeal closure, and pharyngeal constriction s/p prolonged intubation with deconditioning resulting in pharyngeal residuals and penetration to the vocal cords initially with consistencies thinner than puree. Highly suspect dried secretions coating pharynx initially worsening clearance of pharynx. Use of left head turn significantly improving function (? bipassing NG tube and/or compensating for possible left sided laryngeal edema/trauma s/p intubation), increasing UES relaxation, decreasing pharyngeal residuals, and improving airway protection with all consistencies tested except thin liquids. Additionally, cued cough/throat clear however improving and successful when combined with multiple swallows to clear the airway. Patient verbalizing fatigue by the end of study. Although results of study much improved, concern remains for worsening of airway protection with fatigue as ability to consistently carryout rigid compensatory strategies. Risk of an aspiration related infection high in this patient with tenuous pulmonary function. Additonally, noted build up of stasis throughout esophagus post swallow (with solid bolus) raising concern for post prandial aspiration. Recommend initiation of pos via small amounts only initially to enforce use of compensatory  strategies and monitor for tolerance, 4 ounces of pureed solids and 4 ounces of nectar thick liquids, 3 x per day.    Swallow Evaluation Recommendations       SLP Diet Recommendations: Other (Comment) (4 oz puree and 4 oz nectar thick liquid 3 x/day)   Liquid Administration via: Cup   Medication Administration: Via alternative means   Supervision: Patient able to self feed;Full supervision/cueing for compensatory strategies   Compensations: Slow rate;Small sips/bites;Multiple dry swallows after each bite/sip;Other (Comment) (LEFT HEAD TURN WITH ALL BITES AND SIPS)   Postural Changes: Seated upright at 90 degrees;Remain semi-upright after after feeds/meals (Comment)   Oral Care Recommendations: Oral care before and after PO      Taeveon Keesling MA, CCC-SLP [phone]   [name] 11/28/2016,11:23 AM

## 2016-11-28 NOTE — Progress Notes (Signed)
23 Days Post-Op Procedure(s) (LRB): VIDEO BRONCHOSCOPY WITH INSERTION OF INTERBRONCHIAL VALVE (IBV) times three (N/A) Subjective: Up in chair In good spirits after swallow  Objective: Vital signs in last 24 hours: Temp:  [98.3 F (36.8 C)-99.1 F (37.3 C)] 98.6 F (37 C) (02/14 1100) Pulse Rate:  [73-98] 74 (02/14 0700) Cardiac Rhythm: Normal sinus rhythm (02/14 1300) Resp:  [19-36] 19 (02/14 0700) BP: (115-150)/(61-97) 135/70 (02/14 0600) SpO2:  [92 %-100 %] 95 % (02/14 0731) Weight:  [137 lb 2 oz (62.2 kg)] 137 lb 2 oz (62.2 kg) (02/14 0500)  Hemodynamic parameters for last 24 hours:    Intake/Output from previous day: 02/13 0701 - 02/14 0700 In: 2793.7 [I.V.:618.7; NG/GT:1810; IV Piggyback:365] Out: 815 [Urine:625; Chest Tube:190] Intake/Output this shift: Total I/O In: 823.5 [I.V.:213.5; NG/GT:610] Out: 20 [Chest Tube:20]  General appearance: alert, cooperative and no distress Neurologic: intact Heart: regular rate and rhythm Lungs: diminished breath sounds rigth base no air leak  Lab Results:  Recent Labs  11/26/16 0437 11/28/16 0411  WBC 8.7 9.2  HGB 8.7* 8.5*  HCT 27.4* 26.6*  PLT 312 301   BMET:  Recent Labs  11/26/16 0437 11/28/16 0411  NA 139 138  K 3.7 3.7  CL 96* 97*  CO2 37* 33*  GLUCOSE 127* 133*  BUN 13 11  CREATININE 0.73 0.79  CALCIUM 8.1* 8.4*    PT/INR: No results for input(s): LABPROT, INR in the last 72 hours. ABG    Component Value Date/Time   PHART 7.389 11/22/2016 1415   HCO3 35.1 (H) 11/22/2016 1415   TCO2 28 11/12/2016 1519   ACIDBASEDEF 5.0 (H) 11/11/2016 0423   O2SAT 90.4 11/22/2016 1415   CBG (last 3)   Recent Labs  11/28/16 0352 11/28/16 0944 11/28/16 1159  GLUCAP 128* 154* 99    Assessment/Plan: S/P Procedure(s) (LRB): VIDEO BRONCHOSCOPY WITH INSERTION OF INTERBRONCHIAL VALVE (IBV) times three (N/A) -Passed swallow- will start PO with precautions outlined by Speech -CXR OK, continue cough, depp  breathing, IS, nebs - completed 10 day course of antibiotics- follow - maintaining SR on amiodarone - mobilize as tolerated   LOS: 30 days    Melrose Nakayama 11/28/2016

## 2016-11-28 NOTE — Progress Notes (Signed)
Speech Language Pathology Treatment: Dysphagia  Patient Details Name: [name] MRN: [id number] DOB: 17-Jul-1946 Today's Date: 11/28/2016 Time: 4462-8638 SLP Time Calculation (min) (ACUTE ONLY): 12 min  Assessment / Plan / Recommendation Clinical Impression  Returned to patients room following MBS. Treatment focused on education. Patient, spouse, and RN educated regarding results of MBS and recommendations. Compensatory strategies reviewed. Signs placed at [facility]. All verbalized understanding. Will f/u closely to monitor for tolerance and potential to advance diet further.    HPI HPI: 71 y.o. 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 Initially was ambulatory in the ICU and out of bed without difficulty. She later had worsening hypoxia and respiratory status prompting initiation of BiPAP support and PCCM consultation.  Course complicated by shock thought to be sepsis from PNA but no positive cultures, AF with RVR, bilateral LE DVT.  Intubated for hypoxic respiratory failure 1/26.        SLP Plan  Goals updated     Recommendations  Diet recommendations: Other(comment) (4 oz puree and 4 oz nectar thick liquid 3x/day) Liquids provided via: Cup Medication Administration: Via alternative means Supervision: Patient able to self feed;Full supervision/cueing for compensatory strategies Compensations: Slow rate;Small sips/bites;Multiple dry swallows after each bite/sip;Other (Comment) (HEAD TURN TO LEFT WITH ALL BITE AND SIPS) Postural Changes and/or Swallow Maneuvers: Upright 30-60 min after meal;Seated upright 90 degrees;Head turn left during swallow                Oral Care Recommendations: Oral care before and after PO Follow up Recommendations: Inpatient Rehab Plan: Goals updated       [place],  [place] [phone]   [name] 11/28/2016, 11:28 AM

## 2016-11-28 NOTE — Progress Notes (Signed)
RT note:  NTS with small, white/bloody, thick secretion return.  Patient tolerated well.  Post procedure HR: 87 RR: 19 SpO2: 98%

## 2016-11-28 NOTE — Progress Notes (Signed)
PT Cancellation Note  Patient Details Name: [name] MRN: [id number] DOB: 16-Jul-1946   Cancelled Treatment:    Reason Eval/Treat Not Completed: Patient at procedure or test/unavailable;Fatigue/lethargy limiting ability to participate.  Pt at swallow study earlier this am and now fatigued per RN.  Will try back another time.     Thornton Papas Jag Lenz 11/28/2016, 9:37 AM

## 2016-11-28 NOTE — Progress Notes (Signed)
RT note: patient unable to perform CPT for 1200 round due to using bathroom.  Will get dose at 1600.

## 2016-11-28 NOTE — Progress Notes (Addendum)
TCTS DAILY ICU PROGRESS NOTE                   [address]            [place] [address]          [phone]   23 Days Post-Op Procedure(s) (LRB): VIDEO BRONCHOSCOPY WITH INSERTION OF INTERBRONCHIAL VALVE (IBV) times three (N/A) Day 25:Post-Op Procedure(s) (LRB): VIDEO BRONCHOSCOPY (N/A) RIGHT VIDEO ASSISTED THORACOSCOPY /RIGHT UPPER LOBE LUNG RESECTION and Right Thoracotomy (Right) LYMPH NODE DISSECTION (Right  Total Length of Stay:  LOS: 30 days   Subjective: Patient had swallow study earlier. She is tired this am.  Objective: Vital signs in last 24 hours: Temp:  [98.3 F (36.8 C)-99.1 F (37.3 C)] 98.8 F (37.1 C) (02/14 0400) Pulse Rate:  [73-98] 74 (02/14 0700) Cardiac Rhythm: Normal sinus rhythm (02/14 0400) Resp:  [14-36] 19 (02/14 0700) BP: (115-150)/(61-97) 135/70 (02/14 0600) SpO2:  [92 %-100 %] 95 % (02/14 0731) Weight:  [137 lb 2 oz (62.2 kg)] 137 lb 2 oz (62.2 kg) (02/14 0500)  Filed Weights   11/26/16 0500 11/27/16 0500 11/28/16 0500  Weight: 139 lb 8.8 oz (63.3 kg) 139 lb 12.4 oz (63.4 kg) 137 lb 2 oz (62.2 kg)    Weight change: -2 lb 10.3 oz (-1.2 kg)      Intake/Output from previous day: 02/13 0701 - 02/14 0700 In: 2793.7 [I.V.:618.7; NG/GT:1810; IV Piggyback:365] Out: 815 [Urine:625; Chest Tube:190]  Intake/Output this shift: No intake/output data recorded.  Current Meds: Scheduled Meds: . amiodarone  200 mg Per Tube BID  . cefTAZidime (FORTAZ)  IV  1 g Intravenous Q8H  . chlorhexidine  15 mL Mouth Rinse BID  . Chlorhexidine Gluconate Cloth  6 each Topical Daily  . free water  200 mL Per Tube Q6H  . Gerhardt's butt cream   Topical BID  . insulin aspart  0-15 Units Subcutaneous Q4H  . ipratropium  0.5 mg Nebulization Q6H  . levalbuterol  0.63 mg Nebulization Q6H  . magic mouthwash  5 mL Oral TID  . mouth rinse  15 mL Mouth Rinse q12n4p  . pantoprazole sodium  40 mg Per Tube Q1200  . sodium chloride flush  10-40 mL  Intracatheter Q12H   Continuous Infusions: . sodium chloride 10 mL/hr at 11/28/16 0700  . dextrose 10 mL/hr at 11/28/16 0700  . feeding supplement (OSMOLITE 1.5 CAL) 1,000 mL (11/28/16 0700)  . heparin 1,050 Units/hr (11/28/16 0800)   PRN Meds:.acetaminophen, fentaNYL (SUBLIMAZE) injection, levalbuterol, lip balm, potassium chloride (KCL MULTIRUN) 30 mEq in 265 mL IVPB, sodium chloride, sodium chloride flush   Physical Exam: Heart: RRR Lungs: Diminished on the right and clear on the left Abdomen: soft, non-tender; sporadic bowel sounds Extremities: Mild LE edema Wound: Clean and dry Chest tube: to water seal and no air leak  Lab Results: CBC:  Recent Labs  11/26/16 0437 11/28/16 0411  WBC 8.7 9.2  HGB 8.7* 8.5*  HCT 27.4* 26.6*  PLT 312 301   BMET:   Recent Labs  11/26/16 0437 11/28/16 0411  NA 139 138  K 3.7 3.7  CL 96* 97*  CO2 37* 33*  GLUCOSE 127* 133*  BUN 13 11  CREATININE 0.73 0.79  CALCIUM 8.1* 8.4*    CMET: Lab Results  Component Value Date   WBC 9.2 11/28/2016   HGB 8.5 (L) 11/28/2016   HCT 26.6 (L) 11/28/2016   PLT 301 11/28/2016   GLUCOSE 133 (  H) 11/28/2016   TRIG 137 11/12/2016   ALT 21 11/27/2016   AST 20 11/27/2016   NA 138 11/28/2016   K 3.7 11/28/2016   CL 97 (L) 11/28/2016   CREATININE 0.79 11/28/2016   BUN 11 11/28/2016   CO2 33 (H) 11/28/2016   INR 1.04 11/11/2016    PT/INR: No results for input(s): LABPROT, INR in the last 72 hours. Radiology: Dg Chest Port 1 View  Result Date: 11/28/2016 CLINICAL DATA:  Sore chest.  Chest tube. EXAM: PORTABLE CHEST 1 VIEW COMPARISON:  11/27/2016. FINDINGS: Feeding tube noted with tip below left hemidiaphragm. Right IJ line and right chest tube in stable position. No pneumothorax. Postsurgical changes right lung again noted with volume loss. Interbronchial valves noted. IMPRESSION: 1. Lines and tubes including right chest tube in stable position. No pneumothorax. 2. Postsurgical changes right  lung again noted. Interbronchial valves wall again noted. Persistent volume loss right lung, no change from prior exam . Electronically Signed   By: Silo   On: 11/28/2016 07:59     Assessment/Plan:   Procedure(s) (LRB): VIDEO BRONCHOSCOPY WITH INSERTION OF INTERBRONCHIAL VALVE (IBV) times three (N/A) Day Post-Op Procedure(s) (LRB): VIDEO BRONCHOSCOPY WITH INSERTION OF INTERBRONCHIAL VALVE (IBV) times three (N/A) Post-Op Procedure(s) (LRB): VIDEO BRONCHOSCOPY (N/A) RIGHT VIDEO ASSISTED THORACOSCOPY /RIGHT UPPER LOBE LUNG RESECTION and Right Thoracotomy (Right) LYMPH NODE DISSECTION (Right    1.CV-SR in the 80's this am. On Amiodarone 200 mg bid per tube. On Heparin drip for DVTs, previous a fib. 2.Pulmonary-s/p RUL 1/18 and s/p EBV valves on 01/22 for persistent air leak. Chest tubes with 190 cc of yellow pleural fluid last 24 hours . Chest tube to water seal and no air leak. Output continues to decrease. On 2 liters of oxygen via [place]. CXR this am appears stable. Continue Mucomyst,flutter valve and incentive spirometer.  3. GI-Cortrak came out over weekend-it is now in stomach. Await swallow study results and will follow recommendations accordingly.  TFs at 50 ml/hr.  4. ABL anemia-Last H and H this am 8.7 and 27.4 5.ID-stopped Tressie Ellis this am (had 10 days). 6. Hope to transfer in am  Arnoldo Lenis 11/28/2016 8:36 AM   Patient examined and chest x-ray personally reviewed Right lung aeration continues to be remarkably improved There is no air leak and serous drainage has also significantly reduced. She is ready to start a dysphagia diet as ordered by speech therapy following her swallow test today. She'll start 3 meals customized to 4 ounces of rate solid and 4 ounces of neck or thick liquids. She should continue her tube feeds until we are sure she will tolerate and comply with her recommended  oral intake. Remove chest tube in a.m. if drainage remains scant

## 2016-11-28 NOTE — Progress Notes (Signed)
RRT performed NTS x1 w/pt d/t inability for pt to clear all secretions on her own. Still has wet, junky cough after, but some blood-tinged secretions were suctioned out. Will continue to monitor and coach DB&C.

## 2016-11-28 NOTE — Progress Notes (Signed)
ANTICOAGULATION CONSULT NOTE - FOLLOW UP  Pharmacy Consult for:  Heparin Indication: DVT and atrial fibrillation  Allergies  Allergen Reactions  . Nickel Rash    Patient Measurements: Height: '5\' 5"'$  (165.1 cm) Weight: 137 lb 2 oz (62.2 kg) IBW/kg (Calculated) : 57 Heparin Dosing Weight: 69 kg  Vital Signs: Temp: 98.8 F (37.1 C) (02/14 0400) Temp Source: Oral (02/14 0400) BP: 135/70 (02/14 0600) Pulse Rate: 74 (02/14 0700)  Labs:  Recent Labs  11/26/16 0437 11/26/16 0438 11/27/16 0345 11/28/16 0410 11/28/16 0411  HGB 8.7*  --   --   --  8.5*  HCT 27.4*  --   --   --  26.6*  PLT 312  --   --   --  301  HEPARINUNFRC  --  0.34 0.45 0.41  --   CREATININE 0.73  --   --   --  0.79    Estimated Creatinine Clearance: 58 mL/min (by C-G formula based on SCr of 0.79 mg/dL).   Assessment: 83 yoF with new bilateral peroneal DVT and AFib to continue on IV heparin.  Negative for HIT.  Heparin level remains therapeutic and stable.  CBC stable; no bleeding documented.   Goal of Therapy:  Heparin level 0.3- 0.7 units/mL Monitor platelets by anticoagulation protocol: Yes    Plan:  - Continue heparin drip at 1050 units/hr - Daily heparin level and CBC - Monitor for s/sx bleeding - F/U PO anticoagulation plan when pass swallow eval    Jadarion Halbig D. Mina Marble, PharmD, BCPS Pager:  [phone] 11/28/2016, 11:16 AM

## 2016-11-29 ENCOUNTER — Ambulatory Visit: Payer: Medicare Other | Admitting: Emergency Medicine

## 2016-11-29 ENCOUNTER — Inpatient Hospital Stay (HOSPITAL_COMMUNITY): Payer: Medicare Other

## 2016-11-29 LAB — CBC
HEMATOCRIT: 27.2 % — AB (ref 36.0–46.0)
Hemoglobin: 8.7 g/dL — ABNORMAL LOW (ref 12.0–15.0)
MCH: 30.7 pg (ref 26.0–34.0)
MCHC: 32 g/dL (ref 30.0–36.0)
MCV: 96.1 fL (ref 78.0–100.0)
PLATELETS: 270 10*3/uL (ref 150–400)
RBC: 2.83 MIL/uL — ABNORMAL LOW (ref 3.87–5.11)
RDW: 17.9 % — AB (ref 11.5–15.5)
WBC: 8.2 10*3/uL (ref 4.0–10.5)

## 2016-11-29 LAB — BASIC METABOLIC PANEL
Anion gap: 6 (ref 5–15)
BUN: 13 mg/dL (ref 6–20)
CO2: 32 mmol/L (ref 22–32)
CREATININE: 0.76 mg/dL (ref 0.44–1.00)
Calcium: 8.4 mg/dL — ABNORMAL LOW (ref 8.9–10.3)
Chloride: 99 mmol/L — ABNORMAL LOW (ref 101–111)
GFR calc Af Amer: >60 mL/min (ref >60)
GFR calc non Af Amer: >60 mL/min (ref >60)
GLUCOSE: 122 mg/dL — AB (ref 65–99)
Potassium: 4 mmol/L (ref 3.5–5.1)
Sodium: 137 mmol/L (ref 135–145)

## 2016-11-29 LAB — GLUCOSE, CAPILLARY
Glucose-Capillary: 144 mg/dL — ABNORMAL HIGH (ref 65–99)
Glucose-Capillary: 156 mg/dL — ABNORMAL HIGH (ref 65–99)
Glucose-Capillary: 91 mg/dL (ref 65–99)
Glucose-Capillary: 125 mg/dL — ABNORMAL HIGH (ref 65–99)
Glucose-Capillary: 133 mg/dL — ABNORMAL HIGH (ref 65–99)

## 2016-11-29 LAB — HEPARIN LEVEL (UNFRACTIONATED): HEPARIN UNFRACTIONATED: 0.53 [IU]/mL (ref 0.30–0.70)

## 2016-11-29 NOTE — Progress Notes (Addendum)
Physical Therapy Treatment Patient Details Name: [name] MRN: [id number] DOB: [birth year] Today's Date: 11/29/2016    History of Present Illness 71 y.o. 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 Initially was ambulatory in the ICU and out of bed without difficulty. She later had worsening hypoxia and  Intubated for hypoxic respiratory failure 1/74  Course complicated by shock thought to be sepsis from PNA but no positive cultures, AF with RVR, bilateral LE DVT.  Patient extubated 11/14/16.    PT Comments    Pt pleasant and reports fatigue but motivated and willing to ambulate. Pt with increased ability with transfers, gait and activity tolerance. Pt on 2L for half of gait with drop to 86% and bump to 4L with sats 98%, however not a great pleth and unsure if she really desaturated with activity. Pt educated for tranfers and progression. CIR continues to remain an appropriate plan. Will continue to follow and encouraged increased mobility with nursing.   Hr 84 sats 96% on 2L   Follow Up Recommendations  CIR;Supervision for mobility/OOB     Equipment Recommendations       Recommendations for Other Services       Precautions / Restrictions Precautions Precautions: Fall Precaution Comments: chest tube Restrictions Weight Bearing Restrictions: No    Mobility  Bed Mobility Overal bed mobility: Needs Assistance Bed Mobility: Sit to Supine       Sit to supine: Min guard   General bed mobility comments: minguard for safety with assist for lines and increased time with use of rail   Transfers Overall transfer level: Needs assistance   Transfers: Sit to/from Stand Sit to Stand: Min guard         General transfer comment: cues for hand placement from chair and toilet  Ambulation/Gait Ambulation/Gait  assistance: Min guard Ambulation Distance (Feet): 150 Feet Assistive device: Rolling walker (2 wheeled) Gait Pattern/deviations: Step-through pattern;Decreased stride length;Trunk flexed   Gait velocity interpretation: Below normal speed for age/gender General Gait Details: cues for posture and breathing techhnique   Stairs            Wheelchair Mobility    Modified Rankin (Stroke Patients Only)       Balance     Sitting balance-Leahy Scale: Good       Standing balance-Leahy Scale: Fair                      Cognition Arousal/Alertness: Awake/alert Behavior During Therapy: WFL for tasks assessed/performed Overall Cognitive Status: Within Functional Limits for tasks assessed                      Exercises      General Comments        Pertinent Vitals/Pain Pain Assessment: 0-10 Pain Score: 2  Pain Intervention(s): Monitored during session;Limited activity within patient's tolerance    Home Living                      Prior Function            PT Goals (current goals can now be found in the care plan section) Acute Rehab PT Goals Time For Goal Achievement: 12/13/16 Progress towards PT goals: Goals met and updated - see care plan    Frequency  PT Plan Current plan remains appropriate    Co-evaluation             End of Session Equipment Utilized During Treatment: Oxygen;Gait belt Activity Tolerance: Patient tolerated treatment well Patient left: in bed;with call bell/phone within reach;with nursing/sitter in room     Time: 0931-0959 PT Time Calculation (min) (ACUTE ONLY): 28 min  Charges:  $Gait Training: 8-22 mins $Therapeutic Activity: 8-22 mins                    G Codes:      Samanvi Cuccia B Anja Neuzil 26-Dec-2016, 10:20 AM  [name], [place]

## 2016-11-29 NOTE — Care Management Note (Signed)
Case Management Note  Patient Details  Name: [name] MRN: [id number] Date of Birth: June 11, 1946  Subjective/Objective:    Pt lives with spouse, is hopeful that she will be admitted to inpatient rehab when medically stable. Is very pleased with progress during the last week and is very motivated to continue work on mobility.                     Expected Discharge Plan:  [place]  Discharge planning Services  CM Consult  Status of Service:  In process, will continue to follow  Girard Cooter, RN 11/29/2016, 1:07 PM

## 2016-11-29 NOTE — Progress Notes (Signed)
Speech Language Pathology Treatment: Dysphagia  Patient Details Name: [name] MRN: [id number] DOB: [birth year] Today's Date: 11/29/2016 Time: 7544-9201 SLP Time Calculation (min) (ACUTE ONLY): 16 min  Assessment / Plan / Recommendation Clinical Impression  Patient alert and cooperative, upright in chair. Reporting moderate fatigue this am. Upon patient request, SLP assisted with repositioning and oral care to maximize safety with po trials. Educated family regarding diet recommendations and demonstrated thickening process. Patient able to self feed 2 bites of pureed solid and 3-4 sips of nectar thick liquid with independent use of swallowing precautions (left head turn, multiple swallows, slow rate, small bolus size). Following intake, patient declining further pos, reporting increased fatigue. At this time, patient appears to be tolerating diet although note today's CXR :Increased consolidation right middle and lower lobes with volume loss, increased. Asymmetric pulmonary edema on the right. Will continue to f/u closely. Continue current plan. Patient will need to demonstrate increased stamina for po intake before tube feedings can be decreased and patient can initiate a full po diet.    HPI HPI: 71 y.o. 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 Initially was ambulatory in the ICU and out of bed without difficulty. She later had worsening hypoxia and respiratory status prompting initiation of BiPAP support and PCCM consultation.  Course complicated by shock thought to be sepsis from PNA but no positive cultures, AF with RVR, bilateral LE DVT.  Intubated for hypoxic respiratory failure 1/26.        SLP Plan  Continue with current plan of care     Recommendations  Diet recommendations:  (4 ounces puree and 4 oz nectar thick liquid  3x/day) Liquids provided via: Cup Medication Administration: Via alternative means Supervision: Patient able to self feed;Full supervision/cueing for compensatory strategies Compensations: Slow rate;Small sips/bites;Multiple dry swallows after each bite/sip;Other (Comment) (HEAD TURN TO THE LEFT WITH ALL BITES/SIPS) Postural Changes and/or Swallow Maneuvers: Head turn left during swallow;Seated upright 90 degrees;Upright 30-60 min after meal                Oral Care Recommendations: Oral care before and after PO Follow up Recommendations: Inpatient Rehab Plan: Continue with current plan of care       [place], [place] [phone]    [place] 11/29/2016, 9:42 AM

## 2016-11-29 NOTE — Progress Notes (Signed)
[facility] BRIEF SICU PROGRESS NOTE  24 Days Post-Op  S/P Procedure(s) (LRB): VIDEO BRONCHOSCOPY WITH INSERTION OF INTERBRONCHIAL VALVE (IBV) times three (N/A)   Stable day  Plan: Continue current plan  Rexene Alberts, MD 11/29/2016 5:52 PM

## 2016-11-29 NOTE — Progress Notes (Signed)
24 Days Post-Op Procedure(s) (LRB): VIDEO BRONCHOSCOPY WITH INSERTION OF INTERBRONCHIAL VALVE (IBV) times three (N/A) Subjective: Feels better with chest tube out Has been clearing some phlegm  Objective: Vital signs in last 24 hours: Temp:  [98 F (36.7 C)-99.1 F (37.3 C)] 98.5 F (36.9 C) (02/15 1141) Pulse Rate:  [70-92] 81 (02/15 1100) Cardiac Rhythm: Normal sinus rhythm (02/15 0800) Resp:  [16-30] 20 (02/15 1100) BP: (127-155)/(61-103) 140/76 (02/15 1100) SpO2:  [94 %-100 %] 99 % (02/15 1100) Weight:  [136 lb (61.7 kg)] 136 lb (61.7 kg) (02/15 0600)  Hemodynamic parameters for last 24 hours:    Intake/Output from previous day: 02/14 0701 - 02/15 0700 In: 2251.5 [I.V.:591.5; NG/GT:1660] Out: 510 [Urine:450; Chest Tube:60] Intake/Output this shift: Total I/O In: 242 [I.V.:92; NG/GT:150] Out: -   General appearance: alert, cooperative and no distress Neurologic: intact Heart: regular rate and rhythm Lungs: diminished breath sounds right base  Lab Results:  Recent Labs  11/28/16 0411 11/29/16 0455  WBC 9.2 8.2  HGB 8.5* 8.7*  HCT 26.6* 27.2*  PLT 301 270   BMET:  Recent Labs  11/28/16 0411 11/29/16 0455  NA 138 137  K 3.7 4.0  CL 97* 99*  CO2 33* 32  GLUCOSE 133* 122*  BUN 11 13  CREATININE 0.79 0.76  CALCIUM 8.4* 8.4*    PT/INR: No results for input(s): LABPROT, INR in the last 72 hours. ABG    Component Value Date/Time   PHART 7.389 11/22/2016 1415   HCO3 35.1 (H) 11/22/2016 1415   TCO2 28 11/12/2016 1519   ACIDBASEDEF 5.0 (H) 11/11/2016 0423   O2SAT 90.4 11/22/2016 1415   CBG (last 3)   Recent Labs  11/29/16 0353 11/29/16 0759 11/29/16 1136  GLUCAP 144* 156* 91    Assessment/Plan: S/P Procedure(s) (LRB): VIDEO BRONCHOSCOPY WITH INSERTION OF INTERBRONCHIAL VALVE (IBV) times three (N/A) -  CV- maintaining SR on Amiodarone DVT- on heparin, convert to PO when taking POs better RESP_ increased atelectasis at right base this  AM  IS, flutter  No air leak and minimal drainage from CT- dc CT RENAL- no issues GI- started on POs, intake limited so far, continue Tf until PO increases   LOS: 31 days    [place] Nakayama 11/29/2016

## 2016-11-29 NOTE — Progress Notes (Signed)
ANTICOAGULATION CONSULT NOTE - FOLLOW UP  Pharmacy Consult for:  Heparin Indication: DVT and atrial fibrillation  Allergies  Allergen Reactions  . Nickel Rash    Patient Measurements: Height: '5\' 5"'$  (165.1 cm) Weight: 136 lb (61.7 kg) IBW/kg (Calculated) : 57 Heparin Dosing Weight: 69 kg  Vital Signs: Temp: 98 F (36.7 C) (02/15 0801) Temp Source: Oral (02/15 0801) BP: 128/66 (02/15 0900) Pulse Rate: 76 (02/15 0900)  Labs:  Recent Labs  11/27/16 0345 11/28/16 0410 11/28/16 0411 11/29/16 0455 11/29/16 0500  HGB  --   --  8.5* 8.7*  --   HCT  --   --  26.6* 27.2*  --   PLT  --   --  301 270  --   HEPARINUNFRC 0.45 0.41  --   --  0.53  CREATININE  --   --  0.79 0.76  --     Estimated Creatinine Clearance: 58 mL/min (by C-G formula based on SCr of 0.76 mg/dL).   Assessment: 56 yoF with new bilateral peroneal DVT and AFib to continue on IV heparin.  Negative for HIT.  Heparin level remains therapeutic and stable.  CBC stable; no bleeding documented.   Goal of Therapy:  Heparin level 0.3- 0.7 units/mL Monitor platelets by anticoagulation protocol: Yes    Plan:  - Continue heparin drip at 1050 units/hr - Daily heparin level and CBC - Monitor for s/sx bleeding - F/U with transitioning to PO anticoagulation     Deronte Solis D. Mina Marble, PharmD, BCPS Pager:  [phone] 11/29/2016, 9:57 AM

## 2016-11-30 ENCOUNTER — Inpatient Hospital Stay (HOSPITAL_COMMUNITY): Payer: Medicare Other

## 2016-11-30 ENCOUNTER — Encounter: Payer: Self-pay | Admitting: *Deleted

## 2016-11-30 LAB — GLUCOSE, CAPILLARY
Glucose-Capillary: 102 mg/dL — ABNORMAL HIGH (ref 65–99)
Glucose-Capillary: 108 mg/dL — ABNORMAL HIGH (ref 65–99)
Glucose-Capillary: 118 mg/dL — ABNORMAL HIGH (ref 65–99)
Glucose-Capillary: 126 mg/dL — ABNORMAL HIGH (ref 65–99)
Glucose-Capillary: 133 mg/dL — ABNORMAL HIGH (ref 65–99)
Glucose-Capillary: 158 mg/dL — ABNORMAL HIGH (ref 65–99)

## 2016-11-30 LAB — CBC
HCT: 27.8 % — ABNORMAL LOW (ref 36.0–46.0)
HEMOGLOBIN: 8.8 g/dL — AB (ref 12.0–15.0)
MCH: 30.2 pg (ref 26.0–34.0)
MCHC: 31.7 g/dL (ref 30.0–36.0)
MCV: 95.5 fL (ref 78.0–100.0)
Platelets: 282 10*3/uL (ref 150–400)
RBC: 2.91 MIL/uL — ABNORMAL LOW (ref 3.87–5.11)
RDW: 17.7 % — ABNORMAL HIGH (ref 11.5–15.5)
WBC: 8.8 10*3/uL (ref 4.0–10.5)

## 2016-11-30 LAB — HEPARIN LEVEL (UNFRACTIONATED): Heparin Unfractionated: 0.32 IU/mL (ref 0.30–0.70)

## 2016-11-30 MED ORDER — WARFARIN - PHYSICIAN DOSING INPATIENT
Freq: Every day | Status: DC
  Administered 2016-12-01 – 2016-12-02 (×2)
  Administered 2016-12-03: 1

## 2016-11-30 MED ORDER — PRO-STAT SUGAR FREE PO LIQD
30.0000 mL | Freq: Every day | ORAL | Status: DC
  Administered 2016-11-30 – 2016-12-03 (×4): 30 mL
  Filled 2016-11-30 (×4): qty 30

## 2016-11-30 MED ORDER — HEPARIN (PORCINE) IN NACL 100-0.45 UNIT/ML-% IJ SOLN
1050.0000 [IU]/h | INTRAMUSCULAR | Status: DC
  Administered 2016-11-30 – 2016-12-04 (×3): 1100 [IU]/h via INTRAVENOUS
  Filled 2016-11-30 (×6): qty 250

## 2016-11-30 MED ORDER — WARFARIN SODIUM 2.5 MG PO TABS
2.5000 mg | ORAL_TABLET | Freq: Once | ORAL | Status: AC
  Administered 2016-11-30: 2.5 mg via ORAL
  Filled 2016-11-30: qty 1

## 2016-11-30 MED ORDER — ZOLPIDEM TARTRATE 5 MG PO TABS
5.0000 mg | ORAL_TABLET | Freq: Every evening | ORAL | Status: DC | PRN
  Administered 2016-11-30 – 2016-12-04 (×5): 5 mg via ORAL
  Filled 2016-11-30 (×5): qty 1

## 2016-11-30 NOTE — Progress Notes (Signed)
ANTICOAGULATION CONSULT NOTE - FOLLOW UP  Pharmacy Consult for:  Heparin Indication: DVT and atrial fibrillation  Allergies  Allergen Reactions  . Nickel Rash    Patient Measurements: Height: '5\' 5"'$  (165.1 cm) Weight: 135 lb 5.8 oz (61.4 kg) IBW/kg (Calculated) : 57 Heparin Dosing Weight: 69 kg  Vital Signs: Temp: 99.1 F (37.3 C) (02/16 0738) Temp Source: Oral (02/16 0738) BP: 124/92 (02/16 0800) Pulse Rate: 78 (02/16 0800)  Labs:  Recent Labs  11/28/16 0410  11/28/16 0411 11/29/16 0455 11/29/16 0500 11/30/16 0330  HGB  --   < > 8.5* 8.7*  --  8.8*  HCT  --   --  26.6* 27.2*  --  27.8*  PLT  --   --  301 270  --  282  HEPARINUNFRC 0.41  --   --   --  0.53 0.32  CREATININE  --   --  0.79 0.76  --   --   < > = values in this interval not displayed.  Estimated Creatinine Clearance: 58 mL/min (by C-G formula based on SCr of 0.76 mg/dL).  Medications: Heparin @ 1050 units/hr  Assessment: 42 yof continues on heparin for new bilateral peroneal DVTs and afib. Negative for HIT.  Heparin level is therapeutic at 0.32 but trending down. CBC stable; no bleeding documented.   Goal of Therapy:  Heparin level 0.3- 0.7 units/mL Monitor platelets by anticoagulation protocol: Yes    Plan:  1) Increase heparin to 1100 units/hr 2) Daily heparin level and CBC  Nena Jordan, PharmD, BCPS 11/30/2016, 8:20 AM

## 2016-11-30 NOTE — Progress Notes (Signed)
Oncology Nurse Navigator Documentation  Oncology Nurse Navigator Flowsheets 11/30/2016  Navigator Location [facility]  Navigator Encounter Type Other/I followed up on [name].  I noticed she is still in the hospital.  I called SICU to check on her.  I spoke with nurse and he states she is doing better very day.  I asked that he tell her to get well soon and I am thinking of her.  He stated he would.   Treatment Phase Other  Barriers/Navigation Needs (No Data)  Interventions Other  Acuity Level 1  Acuity Level 1 Minimal follow up required  Time Spent with Patient 15

## 2016-11-30 NOTE — Progress Notes (Signed)
Nutrition Follow Up  DOCUMENTATION CODES:   Not applicable  INTERVENTION:    Continue Osmolite 1.5 at goal rate of 50 ml/hr   Prostat liquid protein 30 ml daily  TF regimen to provide 1900 kcals, 90 gm protein, 914 ml of water  NUTRITION DIAGNOSIS:   Inadequate oral intake related to inability to eat as evidenced by NPO status, ongoing  GOAL:   Patient will meet greater than or equal to 90% of their needs, met  MONITOR:   TF tolerance, Diet advancement, Labs, Weight trends, Skin, I & O's  ASSESSMENT:   71 y.o. Female with septic shock and acute hypoxic respiratory failure. Septic source is likely pneumonia. Patient now developing delirium and mild hypercarbia given recent ABG. Lactic acidosis is improving from procalcitonin remains significantly elevated.   Pt s/p procedures 1/22: VIDEO BRONCHOSCOPY WITH INSERTION OF INTERBRONCHIAL VALVE (IBV) x 3  Osmolite 1.5 formula infusing at goal rate of 50 ml/hr via CORTRAK feeding tube. Providing 1800 kcals, 75 gm protein, 914 ml of free water. Prostat liquid protein discontinued 2/9.  Speech Path continues to follow for dysphagia. Free water flushes at 200 ml q 6 hrs. CBG's [phone].  Diet Order:  Diet NPO time specified  Skin:  Wound (see comment) (skin tear to R buttocks)  Last BM:  2/14  Height:   Ht Readings from Last 1 Encounters:  11/28/16 '5\' 5"'$  (1.651 m)   Weight:  Wt Readings from Last 1 Encounters:  11/30/16 135 lb 5.8 oz (61.4 kg)   BMI:  Body mass index is 22.53 kg/m.  Estimated Nutritional Needs:   Kcal:  0388-8280  Protein:  90-100 gm  Fluid:  1.7-1.9 L  EDUCATION NEEDS:   No education needs identified at this time  Arthur Holms, RD, LDN Pager #: [phone] After-Hours Pager #: [phone]

## 2016-11-30 NOTE — Progress Notes (Signed)
Speech Language Pathology Treatment: Dysphagia  Patient Details Name: [name] MRN: [id number] DOB: Jul 08, 1946 Today's Date: 11/30/2016 Time: 0071-2197 SLP Time Calculation (min) (ACUTE ONLY): 23 min  Assessment / Plan / Recommendation Clinical Impression  ST follow up for therapeutic/diagnostic feedings and swallowing therapy.  Patient independently recalled her safe swallow precautions (i.e. Head turn to left and double swallow for each bite/sip).  She reported that her intake of the recommended 4 ozs of dysphagia 1 and 4 ozs of nectar thick liquids 3x/day has been difficult as she gets full quickly.  The patient was observed using her recommended strategies well with intermittent throat clear/cough as well as some SHOB.  The patient was able to take in about 2 ozs of nectar thick liquids and 4-5 bites of dysphagia 1 material when she stated she was too full to have anymore.  She was encouraged to perform effortful swallow frequently throughout the day to assist with swallow recovery and was observed performing this exercise x5.  Handout was left in the room to assist with this.  Recommend continue with current plan.  ST will continue to follow.     HPI HPI: 71 y.o. 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 Initially was ambulatory in the ICU and out of bed without difficulty. She later had worsening hypoxia and respiratory status prompting initiation of BiPAP support and PCCM consultation.  Course complicated by shock thought to be sepsis from PNA but no positive cultures, AF with RVR, bilateral LE DVT.  Intubated for hypoxic respiratory failure 1/26.        SLP Plan  Continue with current plan of care     Recommendations  Diet recommendations: Other(comment) (4 oz dysphagia 1 and 4 ozs nectar thick liquids 3 times/day) Liquids  provided via: Cup Medication Administration: Via alternative means Supervision: Patient able to self feed Compensations: Slow rate;Small sips/bites;Multiple dry swallows after each bite/sip;Other (Comment) (LEFT HEAD TURN WITH ALL BITES/SIPS.) Postural Changes and/or Swallow Maneuvers: Head turn left during swallow;Seated upright 90 degrees;Upright 30-60 min after meal                Oral Care Recommendations: Oral care before and after PO Follow up Recommendations: Inpatient Rehab Plan: Continue with current plan of care       [place], MA, [place] [facility] SLP [phone]  Lamar Sprinkles 11/30/2016, 9:43 AM

## 2016-11-30 NOTE — Progress Notes (Signed)
25 Days Post-Op Procedure(s) (LRB): VIDEO BRONCHOSCOPY WITH INSERTION OF INTERBRONCHIAL VALVE (IBV) times three (N/A) Subjective: Feels better with CT out. Not able to take too much PO Requesting Ambien for sleep  Objective: Vital signs in last 24 hours: Temp:  [98.5 F (36.9 C)-99.1 F (37.3 C)] 99.1 F (37.3 C) (02/16 0738) Pulse Rate:  [70-91] 80 (02/16 0700) Cardiac Rhythm: Normal sinus rhythm (02/16 0000) Resp:  [14-26] 24 (02/16 0700) BP: (128-157)/(61-85) 145/68 (02/16 0700) SpO2:  [94 %-100 %] 100 % (02/16 0807) FiO2 (%):  [32 %-40 %] 32 % (02/16 0807) Weight:  [135 lb 5.8 oz (61.4 kg)] 135 lb 5.8 oz (61.4 kg) (02/16 0200)  Hemodynamic parameters for last 24 hours:    Intake/Output from previous day: 02/15 0701 - 02/16 0700 In: 2462 [I.V.:862; NG/GT:1600] Out: 811 [Urine:800; Stool:1; Chest Tube:10] Intake/Output this shift: No intake/output data recorded.  General appearance: alert, cooperative and no distress Neurologic: intact Heart: regular rate and rhythm Lungs: diminished breath sounds right base Abdomen: normal findings: soft, non-tender  Lab Results:  Recent Labs  11/29/16 0455 11/30/16 0330  WBC 8.2 8.8  HGB 8.7* 8.8*  HCT 27.2* 27.8*  PLT 270 282   BMET:  Recent Labs  11/28/16 0411 11/29/16 0455  NA 138 137  K 3.7 4.0  CL 97* 99*  CO2 33* 32  GLUCOSE 133* 122*  BUN 11 13  CREATININE 0.79 0.76  CALCIUM 8.4* 8.4*    PT/INR: No results for input(s): LABPROT, INR in the last 72 hours. ABG    Component Value Date/Time   PHART 7.389 11/22/2016 1415   HCO3 35.1 (H) 11/22/2016 1415   TCO2 28 11/12/2016 1519   ACIDBASEDEF 5.0 (H) 11/11/2016 0423   O2SAT 90.4 11/22/2016 1415   CBG (last 3)   Recent Labs  11/29/16 1939 11/30/16 0041 11/30/16 0736  GLUCAP 133* 126* 158*    Assessment/Plan: S/P Procedure(s) (LRB): VIDEO BRONCHOSCOPY WITH INSERTION OF INTERBRONCHIAL VALVE (IBV) times three (N/A) -  CV- Atrial fib- maintaining  SR-continue amiodarone  DVT - on heparin drip  Start coumadin today  RESP- CXR better than yesterday AM  Continue IS, flutter  Afebrile off antibiotics  RENAL- no issues  ENDO- CBG well controlled  Deconditioning- ambulated 300' this AM     LOS: 32 days    [place] Nakayama 11/30/2016

## 2016-11-30 NOTE — Progress Notes (Signed)
If you would like pt assessed for a possible inpt rehab admission, please place order for rehab consult to see if pt would be a candidate. [phone]

## 2016-11-30 NOTE — Progress Notes (Signed)
   [facility] PCCM  > I have been peripherally following this pt this week in 2S.  > Her CXR on 2/16 is much improved.  > CT are now out. > Agree with transferring pt  to a different unit (change of scenery) > cont IS, flutter valve.  > cont atrovent and xopenex QID > anticipate d/c to rehab next week  PCCM will sign off for now.  Pls call back if with issues.   Monica Becton, MD 11/30/2016, 11:46 AM [facility] Pager [phone] After 3 pm or if no answer, call [phone]

## 2016-11-30 NOTE — Care Management Important Message (Signed)
Important Message  Patient Details  Name: [name] MRN: [id number] Date of Birth: 05-07-1946   Medicare Important Message Given:  Yes    Erenest Rasher, RN 11/30/2016, 9:41 AM

## 2016-11-30 NOTE — Care Management Note (Signed)
Case Management Note  Patient Details  Name: [name] MRN: [id number] Date of Birth: [birth year]  Subjective/Objective:   Right VATS                 Action/Plan: Discharge Planning: NCM spoke to pt and husband at bedside. PT recommending IP rehab. Pt is agreeable to CIR. Scheduled transfer to stepdown. Will continue to follow up for dc needs. IP rehab coordinator aware of request. Will need insurance auth once established she is appropriate for CIR.   PCP [name] MD  Expected Discharge Date:                  Expected Discharge Plan:  [facility]  In-House Referral:  Clinical Social Work  Discharge planning Services  CM Consult  Post Acute Care Choice:  NA Choice offered to:  NA  DME Arranged:  N/A DME Agency:  NA  HH Arranged:  NA HH Agency:  NA  Status of Service:  In process, will continue to follow  If discussed at Long Length of Stay Meetings, dates discussed:    Additional Comments:  Erenest Rasher, RN 11/30/2016, 9:42 AM

## 2016-11-30 NOTE — Progress Notes (Signed)
Patient ID: [name], female   DOB: [birth year], 71 y.o.   MRN: [id number]  SICU Evening Rounds:  Hemodynamically stable  sats 99%  Ambulated twice today, 300 ft and 150 ft.  Awaiting bed on 4E

## 2016-12-01 ENCOUNTER — Inpatient Hospital Stay (HOSPITAL_COMMUNITY): Payer: Medicare Other

## 2016-12-01 DIAGNOSIS — G6281 Critical illness polyneuropathy: Secondary | ICD-10-CM

## 2016-12-01 DIAGNOSIS — R5381 Other malaise: Secondary | ICD-10-CM

## 2016-12-01 LAB — GLUCOSE, CAPILLARY
Glucose-Capillary: 154 mg/dL — ABNORMAL HIGH (ref 65–99)
Glucose-Capillary: 119 mg/dL — ABNORMAL HIGH (ref 65–99)
Glucose-Capillary: 137 mg/dL — ABNORMAL HIGH (ref 65–99)
Glucose-Capillary: 110 mg/dL — ABNORMAL HIGH (ref 65–99)
Glucose-Capillary: 123 mg/dL — ABNORMAL HIGH (ref 65–99)
Glucose-Capillary: 109 mg/dL — ABNORMAL HIGH (ref 65–99)

## 2016-12-01 LAB — CBC
HCT: 28.6 % — ABNORMAL LOW (ref 36.0–46.0)
Hemoglobin: 9 g/dL — ABNORMAL LOW (ref 12.0–15.0)
MCH: 30.4 pg (ref 26.0–34.0)
MCHC: 31.5 g/dL (ref 30.0–36.0)
MCV: 96.6 fL (ref 78.0–100.0)
Platelets: 273 10*3/uL (ref 150–400)
RBC: 2.96 MIL/uL — ABNORMAL LOW (ref 3.87–5.11)
RDW: 17.7 % — ABNORMAL HIGH (ref 11.5–15.5)
WBC: 8.8 10*3/uL (ref 4.0–10.5)

## 2016-12-01 LAB — BASIC METABOLIC PANEL
Anion gap: 7 (ref 5–15)
BUN: 14 mg/dL (ref 6–20)
CO2: 32 mmol/L (ref 22–32)
Calcium: 8.7 mg/dL — ABNORMAL LOW (ref 8.9–10.3)
Chloride: 100 mmol/L — ABNORMAL LOW (ref 101–111)
Creatinine, Ser: 0.77 mg/dL (ref 0.44–1.00)
GFR calc Af Amer: >60 mL/min (ref >60)
GFR calc non Af Amer: >60 mL/min (ref >60)
Glucose, Bld: 135 mg/dL — ABNORMAL HIGH (ref 65–99)
Potassium: 3.9 mmol/L (ref 3.5–5.1)
Sodium: 139 mmol/L (ref 135–145)

## 2016-12-01 LAB — PROTIME-INR
INR: 1.05
Prothrombin Time: 13.7 seconds (ref 11.4–15.2)

## 2016-12-01 LAB — HEPARIN LEVEL (UNFRACTIONATED): Heparin Unfractionated: 0.53 IU/mL (ref 0.30–0.70)

## 2016-12-01 MED ORDER — IPRATROPIUM BROMIDE 0.02 % IN SOLN
0.5000 mg | Freq: Four times a day (QID) | RESPIRATORY_TRACT | Status: DC
  Administered 2016-12-01 – 2016-12-03 (×8): 0.5 mg via RESPIRATORY_TRACT
  Filled 2016-12-01 (×8): qty 2.5

## 2016-12-01 MED ORDER — WARFARIN SODIUM 5 MG PO TABS
2.5000 mg | ORAL_TABLET | Freq: Once | ORAL | Status: AC
  Administered 2016-12-01: 2.5 mg via ORAL
  Filled 2016-12-01: qty 1
  Filled 2016-12-01: qty 0.5

## 2016-12-01 MED ORDER — LEVALBUTEROL HCL 0.63 MG/3ML IN NEBU
0.6300 mg | INHALATION_SOLUTION | Freq: Four times a day (QID) | RESPIRATORY_TRACT | Status: DC
  Administered 2016-12-01 – 2016-12-03 (×8): 0.63 mg via RESPIRATORY_TRACT
  Filled 2016-12-01 (×8): qty 3

## 2016-12-01 NOTE — Progress Notes (Signed)
ANTICOAGULATION CONSULT NOTE - FOLLOW UP  Pharmacy Consult for:  Heparin Indication: DVT and atrial fibrillation  Allergies  Allergen Reactions  . Nickel Rash    Patient Measurements: Height: 5' 3.5" (161.3 cm) Weight: 140 lb 10.5 oz (63.8 kg) IBW/kg (Calculated) : 53.55 Heparin Dosing Weight: 64.4 kg  Vital Signs: Temp: 98.9 F (37.2 C) (02/17 0817) Temp Source: Oral (02/17 0817) BP: 148/78 (02/17 0817) Pulse Rate: 90 (02/17 0340)  Labs:  Recent Labs  11/29/16 0455 11/29/16 0500 11/30/16 0330 12/01/16 0500  HGB 8.7*  --  8.8* 9.0*  HCT 27.2*  --  27.8* 28.6*  PLT 270  --  282 273  LABPROT  --   --   --  13.7  INR  --   --   --  1.05  HEPARINUNFRC  --  0.53 0.32 0.53  CREATININE 0.76  --   --  0.77    Estimated Creatinine Clearance: 54.6 mL/min (by C-G formula based on SCr of 0.77 mg/dL).  Medications: Heparin @ 1100 units/hr  Assessment: 58 yof continues on heparin for new bilateral peroneal DVTs and afib. Negative for HIT.  Heparin level is therapeutic at 0.53 this morning on 1100 units/hr.  CBC stable; no bleeding documented. Of note, warfarin started 2/16 per MD.    Goal of Therapy:  Heparin level 0.3- 0.7 units/mL Monitor platelets by anticoagulation protocol: Yes    Plan:  Continue  heparin at 1100 units/hr Daily heparin level and CBC  Uvaldo Bristle, PharmD PGY1 Pharmacy Resident Pager: [phone]  12/01/2016, 8:54 AM

## 2016-12-01 NOTE — Consult Note (Signed)
Physical Medicine and Rehabilitation Consult Reason for [facility] Referring Physician: Roxy Horseman   HPI: [name] is a 71 y.o. female with NSCLC s/p recent right lobectomy who developed a persistent airleak. Pt underwent endobronchial valve replacement x3 on 11/05/16 for treatment. Course complicated by respiratory failure and sepsis requiring intubation, bilateral LE DVT's. She was extubated on 11/14/16. Pt currently with NGT on for nutrition as she's had difficulty with stamina in swallowing/tolerating orals. She is taking puree and nectars in small amounts. Due to ongoing issues with stamina and swallowing. PM&R was asked to see the patient regarding recommendation for further rehab.    Review of Systems  Constitutional: Positive for weight loss. Negative for fever.  HENT: Negative for hearing loss.   Respiratory: Positive for cough and shortness of breath.   Cardiovascular: Positive for leg swelling.  Gastrointestinal: Positive for nausea. Negative for heartburn.  Genitourinary: Negative for dysuria.  Musculoskeletal: Positive for back pain.  Skin: Negative for itching.  Neurological: Positive for dizziness and tingling.  Psychiatric/Behavioral: Negative for memory loss.   Past Medical History:  Diagnosis Date  . Arthritis   . Asthma    asthmatic bronchitis  . COPD (chronic obstructive pulmonary disease) ([place])   . Dyspnea   . History of kidney stones   . Hyperlipidemia   . Hypertension   . Pneumonia   . PONV (postoperative nausea and vomiting)    none with her hip surgery  . Restless leg syndrome   . 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 ABDOMINAL HYSTERECTOMY    . BONE DENSIDEXAY  06/02/2013  . CARDIAC CATHETERIZATION    . COLONOSCOPY  08/05/2015  . CYST EXCISION     from back  . CYSTO    . DENTAL EXAMINATION UNDER ANESTHESIA    . DIAGNOSTIC MAMMOGRAM  07/28/2016  . EYE  SURGERY Left    cataract surgery with lens implant  . LYMPH NODE DISSECTION Right 10/29/2016   Procedure: LYMPH NODE DISSECTION;  Surgeon: [name], MD;  Location: [facility];  Service: Thoracic;  Laterality: Right;  . TONSILLECTOMY    . TOTAL HIP ARTHROPLASTY  10/31/2012   Procedure: TOTAL HIP ARTHROPLASTY ANTERIOR APPROACH;  Surgeon: Mcarthur Rossetti, MD;  Location: [facility];  Service: Orthopedics;  Laterality: Right;  Right Total Hip Replacement  . VIDEO ASSISTED THORACOSCOPY (VATS)/WEDGE RESECTION Right 10/29/2016   Procedure: RIGHT  VIDEO ASSISTED THORACOSCOPY /RIGHT UPPER LOBE LUNG RESECTION and Right Thoracotomy;  Surgeon: [name], MD;  Location: [facility];  Service: Thoracic;  Laterality: Right;  [name] N/A 10/29/2016   Procedure: VIDEO BRONCHOSCOPY;  Surgeon: [name], MD;  Location: [facility];  Service: Thoracic;  Laterality: N/A;  . VIDEO BRONCHOSCOPY WITH ENDOBRONCHIAL NAVIGATION N/A 08/29/2016   Procedure: VIDEO BRONCHOSCOPY WITH ENDOBRONCHIAL NAVIGATION;  Surgeon: Collene Gobble, MD;  Location: [facility];  Service: Thoracic;  Laterality: N/A;  . VIDEO BRONCHOSCOPY WITH INSERTION OF INTERBRONCHIAL VALVE (IBV) N/A 11/05/2016   Procedure: VIDEO BRONCHOSCOPY WITH INSERTION OF INTERBRONCHIAL VALVE (IBV) times three;  Surgeon: [name], MD;  Location: [facility];  Service: Thoracic;  Laterality: N/A;   History reviewed. No pertinent family history. Social History:  reports that she quit smoking about 3 months ago. She smoked 0.00 packs per day. She has never used smokeless tobacco. She reports that she does not drink alcohol or use drugs. Allergies:  Allergies  Allergen Reactions  . Nickel Rash   Medications Prior to Admission  Medication Sig Dispense Refill  . simvastatin (ZOCOR) 20 MG tablet Take 20 mg by mouth every morning.    . Tiotropium Bromide-Olodaterol (STIOLTO RESPIMAT) 2.5-2.5 MCG/ACT AERS Inhale 2 puffs into the lungs daily. 1 Inhaler 0  .  [DISCONTINUED] aspirin EC 81 MG tablet Take 81 mg by mouth every evening.     . [DISCONTINUED] Calcium Carbonate-Vitamin D (CALCIUM 600 + D PO) Take 1 tablet by mouth 2 (two) times daily.    . [DISCONTINUED] diltiazem (TIAZAC) 300 MG 24 hr capsule Take 300 mg by mouth every morning.    . [DISCONTINUED] diphenhydrAMINE (BENADRYL) 25 mg capsule Take 50 mg by mouth at bedtime.     . [DISCONTINUED] fluticasone (FLONASE) 50 MCG/ACT nasal spray Place 2 sprays into the nose daily as needed for allergies or rhinitis. For nasal decongestant     . [DISCONTINUED] ibuprofen (ADVIL,MOTRIN) 200 MG tablet Take 400 mg by mouth every 6 (six) hours as needed for mild pain.    . [DISCONTINUED] loratadine (CLARITIN) 10 MG tablet Take 10 mg by mouth daily.     . [DISCONTINUED] meclizine (ANTIVERT) 25 MG tablet Take 25 mg by mouth 3 (three) times daily as needed for dizziness.    . [DISCONTINUED] telmisartan (MICARDIS) 40 MG tablet Take 40 mg by mouth daily.     . [DISCONTINUED] triamterene-hydrochlorothiazide (MAXZIDE-25) 37.5-25 MG per tablet Take 1 tablet by mouth every morning.    . [DISCONTINUED] zolpidem (AMBIEN) 10 MG tablet Take 5 mg by mouth at bedtime as needed for sleep. For sleep     . albuterol (PROVENTIL HFA;VENTOLIN HFA) 108 (90 BASE) MCG/ACT inhaler Inhale 2 puffs into the lungs every 6 (six) hours as needed for wheezing or shortness of breath. For shortness of breath    . [DISCONTINUED] ALPRAZolam (XANAX) 1 MG tablet Take tablet 30 minutes prior to MRI. (Patient not taking: Reported on 10/18/2016) 1 tablet 0  . [DISCONTINUED] sodium chloride (OCEAN) 0.65 % nasal spray Place 2 sprays into the nose daily as needed for congestion. For nasal decongestant     . [DISCONTINUED] tetrahydrozoline 0.05 % ophthalmic solution Place 1 drop into both eyes daily as needed (dry eye).      Home: Home Living Family/patient expects to be discharged to:: Private residence Living Arrangements: Spouse/significant  other Available Help at Discharge: Family, Available 24 hours/day Type of Home: House Home Access: Stairs to enter [employer] of Steps: 5 Entrance Stairs-Rails: Right, Left Home Layout: Multi-level, Bed/bath upstairs (Enter on second floor with bedroom on third floor) Alternate Level Stairs-Number of Steps: flight Home Equipment: Environmental consultant - 2 wheels, [employer] - single point, Careers adviser History: Prior Function Level of Independence: Independent Comments: Driving Functional Status:  Mobility: Bed Mobility Overal bed mobility: Needs Assistance Bed Mobility: Sit to Supine Rolling: Min assist Sidelying to sit: Min assist, Mod assist Supine to sit: Min assist Sit to supine: Min guard Sit to sidelying: Mod assist General bed mobility comments: minguard for safety with assist for lines and increased time with use of rail  Transfers Overall transfer level: Needs assistance Equipment used:  (eva walker) Transfers: Sit to/from Stand Sit to Stand: Min guard General transfer comment: cues for hand placement from chair and toilet Ambulation/Gait Ambulation/Gait assistance: Min guard Ambulation Distance (Feet): 150 Feet Assistive device: Rolling walker (2 wheeled) Gait Pattern/deviations: Step-through pattern, Decreased stride length, Trunk flexed General Gait Details: cues for posture  and breathing techhnique Gait velocity: decreased Gait velocity interpretation: Below normal speed for age/gender    ADL:    Cognition: Cognition Overall Cognitive Status: Within Functional Limits for tasks assessed Orientation Level: Oriented X4 Cognition Arousal/Alertness: Awake/alert Behavior During Therapy: WFL for tasks assessed/performed Overall Cognitive Status: Within Functional Limits for tasks assessed General Comments: A&O x 4; anxious with SOB during ambulation  Blood pressure (!) 148/78, pulse 78, temperature 98.9 F (37.2 C), temperature source Oral, resp. rate  (!) 26, height 5' 3.5" (1.613 m), weight 63.8 kg (140 lb 10.5 oz), SpO2 96 %. Physical Exam  Constitutional: She is oriented to person, place, and time. No distress.  HENT:  Mouth/Throat: Oropharynx is clear and moist.  NGT in place  Eyes: Pupils are equal, round, and reactive to light.  Neck: No JVD present. No thyromegaly present.  Cardiovascular: Normal rate and regular rhythm.   Respiratory: She has wheezes.  Mild sob with speech  GI: She exhibits no distension. There is no tenderness.  Neurological: She is alert and oriented to person, place, and time.  UE motor 4/5 proximal to distal. LE: 3/5 HF, 3+ KE and 4-/5 ADF/PF. Decreased to LT in stocking glove distribution over bilateral feet, to a lesser extent right>left hands.  Skin: She is diaphoretic.  Psychiatric: She has a normal mood and affect. Her behavior is normal. Judgment and thought content normal.    Results for orders placed or performed during the hospital encounter of 10/29/16 (from the past 24 hour(s))  Glucose, capillary     Status: Abnormal   Collection Time: 11/30/16 11:58 AM  Result Value Ref Range   Glucose-Capillary 102 (H) 65 - 99 mg/dL   Comment 1 Capillary Specimen    Comment 2 Notify RN   Glucose, capillary     Status: Abnormal   Collection Time: 11/30/16  4:12 PM  Result Value Ref Range   Glucose-Capillary 133 (H) 65 - 99 mg/dL   Comment 1 Capillary Specimen    Comment 2 Notify RN   Glucose, capillary     Status: Abnormal   Collection Time: 11/30/16  7:29 PM  Result Value Ref Range   Glucose-Capillary 108 (H) 65 - 99 mg/dL   Comment 1 Capillary Specimen    Comment 2 Notify RN   Glucose, capillary     Status: Abnormal   Collection Time: 11/30/16 11:55 PM  Result Value Ref Range   Glucose-Capillary 118 (H) 65 - 99 mg/dL   Comment 1 Notify RN   Glucose, capillary     Status: Abnormal   Collection Time: 12/01/16  3:38 AM  Result Value Ref Range   Glucose-Capillary 154 (H) 65 - 99 mg/dL   Comment  1 Notify RN   Heparin level (unfractionated)     Status: None   Collection Time: 12/01/16  5:00 AM  Result Value Ref Range   Heparin Unfractionated 0.53 0.30 - 0.70 IU/mL  Basic metabolic panel     Status: Abnormal   Collection Time: 12/01/16  5:00 AM  Result Value Ref Range   Sodium 139 135 - 145 mmol/L   Potassium 3.9 3.5 - 5.1 mmol/L   Chloride 100 (L) 101 - 111 mmol/L   CO2 32 22 - 32 mmol/L   Glucose, Bld 135 (H) 65 - 99 mg/dL   BUN 14 6 - 20 mg/dL   Creatinine, Ser 0.77 0.44 - 1.00 mg/dL   Calcium 8.7 (L) 8.9 - 10.3 mg/dL   GFR calc non Af Amer >  60 >60 mL/min   GFR calc Af Amer >60 >60 mL/min   Anion gap 7 5 - 15  CBC     Status: Abnormal   Collection Time: 12/01/16  5:00 AM  Result Value Ref Range   WBC 8.8 4.0 - 10.5 K/uL   RBC 2.96 (L) 3.87 - 5.11 MIL/uL   Hemoglobin 9.0 (L) 12.0 - 15.0 g/dL   HCT 28.6 (L) 36.0 - 46.0 %   MCV 96.6 78.0 - 100.0 fL   MCH 30.4 26.0 - 34.0 pg   MCHC 31.5 30.0 - 36.0 g/dL   RDW 17.7 (H) 11.5 - 15.5 %   Platelets 273 150 - 400 K/uL  Protime-INR     Status: None   Collection Time: 12/01/16  5:00 AM  Result Value Ref Range   Prothrombin Time 13.7 11.4 - 15.2 seconds   INR 1.05   Glucose, capillary     Status: Abnormal   Collection Time: 12/01/16  8:21 AM  Result Value Ref Range   Glucose-Capillary 119 (H) 65 - 99 mg/dL   Dg Chest Port 1 View  Result Date: 12/01/2016 CLINICAL DATA:  Postoperative change with atelectasis EXAM: PORTABLE CHEST 1 VIEW COMPARISON:  November 30, 2016 FINDINGS: There is consolidation and volume loss on the right, stable. Asymmetric pulmonary edema on the right noted. Left lung is hyperexpanded and clear. Heart size and pulmonary vascularity are within normal limits. There is an apparent small pleural effusion on the right. Central catheter tip is in the superior vena cava. Feeding tube tip is below the diaphragm. There is aortic atherosclerosis. No demonstrable adenopathy. Bony structures appear intact.  IMPRESSION: Persistent postoperative change on the right with volume loss and asymmetric edema. There is an apparent small pleural effusion on the right. Left lung clear. Stable cardiac silhouette. There is aortic atherosclerosis. Electronically Signed   By: Lowella Grip III M.D.   On: 12/01/2016 07:49   Dg Chest Port 1 View  Result Date: 11/30/2016 CLINICAL DATA:  Chest tube removal.  Shortness of breath. EXAM: PORTABLE CHEST 1 VIEW COMPARISON:  11/29/2016 FINDINGS: Right jugular catheter terminates over the SVC, unchanged. Enteric tube courses into the left upper abdomen with tip not imaged. Postsurgical changes are again seen with right hemithorax volume loss and rightward mediastinal shift. No definite pneumothorax is identified. Parenchymal opacities in the right lung are greatest in the base and have slightly increased. The left lung remains clear. Apical pleural thickening or small volume fluid is stable to slightly decreased. IMPRESSION: 1. Slightly increased right lung volume loss. 2. No pneumothorax. Electronically Signed   By: Logan Bores M.D.   On: 11/30/2016 07:48   Dg Chest Port 1 View  Result Date: 11/29/2016 CLINICAL DATA:  71 year old female status post VATS and wedge resection on the right in January with subsequent bronchoscopy and interbronchial valve placement. Chest tube removal. Initial encounter. EXAM: PORTABLE CHEST 1 VIEW COMPARISON:  0544 hours today and earlier. FINDINGS: Portable AP semi upright view at at 1302 hours. The remaining right chest tube has been removed. No pneumothorax is identified. There is some apical pleural fluid which might in part be the sequelae of wedge resection. Right lung base ventilation is also improved. Less rightward shift of the mediastinum. Stable right IJ central line. Calcified aortic atherosclerosis. Allowing for portable technique the left lung remains clear. Feeding tube remains in place and courses to the abdomen, tip not included.  IMPRESSION: 1. No pneumothorax identified following right chest tube removal. 2.  Improved right lung base ventilation. 3. No new cardiopulmonary abnormality. Electronically Signed   By: Genevie Ann M.D.   On: 11/29/2016 13:21    Assessment/Plan: Diagnosis: Debility related to NSCLC/lobectomy and related complications. Also with critical illness neuropathy 1. Does the need for close, 24 hr/day medical supervision in concert with the patient's rehab needs make it unreasonable for this patient to be served in a less intensive setting? Yes 2. Co-Morbidities requiring supervision/potential complications: dysphagia/nutrition, pulmonary and oxygen management. Oncological considerations, pain control 3. Due to bladder management, bowel management, safety, skin/wound care, disease management, medication administration, pain management and patient education, does the patient require 24 hr/day rehab nursing? Yes 4. Does the patient require coordinated care of a physician, rehab nurse, PT (1-2 hrs/day, 5 days/week), OT (1-2 hrs/day, 5 days/week) and SLP (1-2 hrs/day, 5 days/week) to address physical and functional deficits in the context of the above medical diagnosis(es)? Yes Addressing deficits in the following areas: balance, endurance, locomotion, strength, transferring, bowel/bladder control, bathing, dressing, feeding, grooming, toileting, swallowing and psychosocial support 5. Can the patient actively participate in an intensive therapy program of at least 3 hrs of therapy per day at least 5 days per week? Yes 6. The potential for patient to make measurable gains while on inpatient rehab is excellent 7. Anticipated functional outcomes upon discharge from inpatient rehab are modified independent and supervision  with PT, modified independent and supervision with OT, modified independent and supervision with SLP. 8. Estimated rehab length of stay to reach the above functional goals is: 7-12 days 9. Does the  patient have adequate social supports and living environment to accommodate these discharge functional goals? Yes 10. Anticipated D/C setting: Home 11. Anticipated post D/C treatments: [facility] therapy 12. Overall Rehab/Functional Prognosis: good  RECOMMENDATIONS: This patient's condition is appropriate for continued rehabilitative care in the following setting: CIR when medically appropriate. Patient has agreed to participate in recommended program. Yes Note that insurance prior authorization may be required for reimbursement for recommended care.  Comment: Rehab Admissions Coordinator to follow up.  Thanks,  Meredith Staggers, MD, [facility] Drown    Meredith Staggers, MD 12/01/2016

## 2016-12-01 NOTE — Progress Notes (Addendum)
[id number]       [place] [id number]             [phone]      26 Days Post-Op Procedure(s) (LRB): VIDEO BRONCHOSCOPY WITH INSERTION OF INTERBRONCHIAL VALVE (IBV) times three (N/A) Subjective: Feels fair  Objective: Vital signs in last 24 hours: Temp:  [98.3 F (36.8 C)-98.9 F (37.2 C)] 98.9 F (37.2 C) (02/17 0817) Pulse Rate:  [78-90] 90 (02/17 0340) Cardiac Rhythm: Normal sinus rhythm (02/17 0700) Resp:  [18-35] 22 (02/17 0340) BP: (138-159)/(72-94) 148/78 (02/17 0817) SpO2:  [91 %-100 %] 96 % (02/17 0842) FiO2 (%):  [28 %] 28 % (02/16 1434) Weight:  [140 lb 10.5 oz (63.8 kg)-141 lb 15.6 oz (64.4 kg)] 140 lb 10.5 oz (63.8 kg) (02/17 0340)  Hemodynamic parameters for last 24 hours:    Intake/Output from previous day: 02/16 0701 - 02/17 0700 In: 1888.6 [I.V.:725.3; NG/GT:1163.3] Out: 1975 [Urine:1975] Intake/Output this shift: No intake/output data recorded.  General appearance: alert, cooperative, fatigued and no distress Heart: regular rate and rhythm Lungs: dim in right bse Abdomen: benign Extremities: no edema Wound: incis healing well  Lab Results:  Recent Labs  11/30/16 0330 12/01/16 0500  WBC 8.8 8.8  HGB 8.8* 9.0*  HCT 27.8* 28.6*  PLT 282 273   BMET:  Recent Labs  11/29/16 0455 12/01/16 0500  NA 137 139  K 4.0 3.9  CL 99* 100*  CO2 32 32  GLUCOSE 122* 135*  BUN 13 14  CREATININE 0.76 0.77  CALCIUM 8.4* 8.7*    PT/INR:  Recent Labs  12/01/16 0500  LABPROT 13.7  INR 1.05   ABG    Component Value Date/Time   PHART 7.389 11/22/2016 1415   HCO3 35.1 (H) 11/22/2016 1415   TCO2 28 11/12/2016 1519   ACIDBASEDEF 5.0 (H) 11/11/2016 0423   O2SAT 90.4 11/22/2016 1415   CBG (last 3)   Recent Labs  11/30/16 2355 12/01/16 0338 12/01/16 0821  GLUCAP 118* 154* 119*    Meds Scheduled Meds: . amiodarone  200 mg Per Tube BID  . chlorhexidine  15 mL Mouth Rinse BID  . Chlorhexidine Gluconate Cloth  6  each Topical Daily  . feeding supplement (PRO-STAT SUGAR FREE 64)  30 mL Per Tube Daily  . free water  200 mL Per Tube Q6H  . Gerhardt's butt cream   Topical BID  . insulin aspart  0-15 Units Subcutaneous Q4H  . ipratropium  0.5 mg Nebulization QID  . levalbuterol  0.63 mg Nebulization QID  . magic mouthwash  5 mL Oral TID  . mouth rinse  15 mL Mouth Rinse q12n4p  . pantoprazole sodium  40 mg Per Tube Q1200  . sodium chloride flush  10-40 mL Intracatheter Q12H  . Warfarin - Physician Dosing Inpatient   Does not apply q1800   Continuous Infusions: . sodium chloride 10 mL/hr at 11/30/16 0800  . dextrose 10 mL/hr at 11/30/16 0700  . feeding supplement (OSMOLITE 1.5 CAL) 1,000 mL (11/30/16 2255)  . heparin 1,100 Units/hr (11/30/16 1136)   PRN Meds:.acetaminophen, fentaNYL (SUBLIMAZE) injection, levalbuterol, lip balm, potassium chloride (KCL MULTIRUN) 30 mEq in 265 mL IVPB, RESOURCE THICKENUP CLEAR, sodium chloride, sodium chloride flush, zolpidem  Xrays Dg Chest Port 1 View  Result Date: 12/01/2016 CLINICAL DATA:  Postoperative change with atelectasis EXAM: PORTABLE CHEST 1 VIEW COMPARISON:  November 30, 2016 FINDINGS: There is consolidation and volume loss on the right, stable.  Asymmetric pulmonary edema on the right noted. Left lung is hyperexpanded and clear. Heart size and pulmonary vascularity are within normal limits. There is an apparent small pleural effusion on the right. Central catheter tip is in the superior vena cava. Feeding tube tip is below the diaphragm. There is aortic atherosclerosis. No demonstrable adenopathy. Bony structures appear intact. IMPRESSION: Persistent postoperative change on the right with volume loss and asymmetric edema. There is an apparent small pleural effusion on the right. Left lung clear. Stable cardiac silhouette. There is aortic atherosclerosis. Electronically Signed   By: Lowella Grip III M.D.   On: 12/01/2016 07:49   Dg Chest Port 1  View  Result Date: 11/30/2016 CLINICAL DATA:  Chest tube removal.  Shortness of breath. EXAM: PORTABLE CHEST 1 VIEW COMPARISON:  11/29/2016 FINDINGS: Right jugular catheter terminates over the SVC, unchanged. Enteric tube courses into the left upper abdomen with tip not imaged. Postsurgical changes are again seen with right hemithorax volume loss and rightward mediastinal shift. No definite pneumothorax is identified. Parenchymal opacities in the right lung are greatest in the base and have slightly increased. The left lung remains clear. Apical pleural thickening or small volume fluid is stable to slightly decreased. IMPRESSION: 1. Slightly increased right lung volume loss. 2. No pneumothorax. Electronically Signed   By: Logan Bores M.D.   On: 11/30/2016 07:48   Dg Chest Port 1 View  Result Date: 11/29/2016 CLINICAL DATA:  71 year old female status post VATS and wedge resection on the right in January with subsequent bronchoscopy and interbronchial valve placement. Chest tube removal. Initial encounter. EXAM: PORTABLE CHEST 1 VIEW COMPARISON:  0544 hours today and earlier. FINDINGS: Portable AP semi upright view at at 1302 hours. The remaining right chest tube has been removed. No pneumothorax is identified. There is some apical pleural fluid which might in part be the sequelae of wedge resection. Right lung base ventilation is also improved. Less rightward shift of the mediastinum. Stable right IJ central line. Calcified aortic atherosclerosis. Allowing for portable technique the left lung remains clear. Feeding tube remains in place and courses to the abdomen, tip not included. IMPRESSION: 1. No pneumothorax identified following right chest tube removal. 2. Improved right lung base ventilation. 3. No new cardiopulmonary abnormality. Electronically Signed   By: Genevie Ann M.D.   On: 11/29/2016 13:21    Assessment/Plan:  1 steady slow progress 2 in sinus rhythm on current rx 3 now on coumadin for  afib/DVT, with heparin gtt- INR 1.05- will give 2.5 coumadin again today 4 CXR stable 5 no fevers or leukocytosis 6 labs stable 7 CBG's adeq controlled 8 conts TF's  LOS: 33 days    GOLD,WAYNE E 12/01/2016   Chart reviewed, patient examined, agree with above. She has been seen by rehab physician and felt to be a candidate for CIR when medically stable if insurance approves.

## 2016-12-01 NOTE — Progress Notes (Signed)
Decreased FIO2 to RA due to stable sats

## 2016-12-01 NOTE — Discharge Instructions (Signed)
Thoracotomy, Care After This sheet gives you information about how to care for yourself after your procedure. Your health care provider may also give you more specific instructions. If you have problems or questions, contact your health care provider. What can I expect after the procedure? After your procedure, it is common to have:  Pain and swelling around the incision area.  Pain when you breathe in (inhale).  Constipation.  Fatigue.  Loss of appetite.  Trouble sleeping.  Mood swings and depression. Follow these instructions at home:    Information on my medicine - Coumadin   (Warfarin)  This medication education was reviewed with me or my healthcare representative as part of my discharge preparation.   Why was Coumadin prescribed for you? Coumadin was prescribed for you because you have a blood clot or a medical condition that can cause an increased risk of forming blood clots. Blood clots can cause serious health problems by blocking the flow of blood to the heart, lung, or brain. Coumadin can prevent harmful blood clots from forming. As a reminder your indication for Coumadin is:   Stroke Prevention Because Of Atrial Fibrillation and blood clots  What test will check on my response to Coumadin? While on Coumadin (warfarin) you will need to have an INR test regularly to ensure that your dose is keeping you in the desired range. The INR (international normalized ratio) number is calculated from the result of the laboratory test called prothrombin time (PT).  If an INR APPOINTMENT HAS NOT ALREADY BEEN MADE FOR YOU please schedule an appointment to have this lab work done by your health care provider within 7 days. Your INR goal is usually a number between:  2 to 3 or your provider may give you a more narrow range like 2-2.5.  Ask your health care provider during an office visit what your goal INR is.  What  do you need to  know  About  COUMADIN? Take Coumadin (warfarin)  exactly as prescribed by your healthcare provider about the same time each day.  DO NOT stop taking without talking to the doctor who prescribed the medication.  Stopping without other blood clot prevention medication to take the place of Coumadin may increase your risk of developing a new clot or stroke.  Get refills before you run out.  What do you do if you miss a dose? If you miss a dose, take it as soon as you remember on the same day then continue your regularly scheduled regimen the next day.  Do not take two doses of Coumadin at the same time.  Important Safety Information A possible side effect of Coumadin (Warfarin) is an increased risk of bleeding. You should call your healthcare provider right away if you experience any of the following: ? Bleeding from an injury or your nose that does not stop. ? Unusual colored urine (red or dark brown) or unusual colored stools (red or black). ? Unusual bruising for unknown reasons. ? A serious fall or if you hit your head (even if there is no bleeding).  Some foods or medicines interact with Coumadin (warfarin) and might alter your response to warfarin. To help avoid this: ? Eat a balanced diet, maintaining a consistent amount of Vitamin K. ? Notify your provider about major diet changes you plan to make. ? Avoid alcohol or limit your intake to 1 drink for women and 2 drinks for men per day. (1 drink is 5 oz. wine, 12 oz. beer, or 1.5  oz. liquor.)  Make sure that ANY health care provider who prescribes medication for you knows that you are taking Coumadin (warfarin).  Also make sure the healthcare provider who is monitoring your Coumadin knows when you have started a new medication including herbals and non-prescription products.  Coumadin (Warfarin)  Major Drug Interactions  Increased Warfarin Effect Decreased Warfarin Effect  Alcohol (large quantities) Antibiotics (esp. Septra/Bactrim, Flagyl, Cipro) Amiodarone (Cordarone) Aspirin  (ASA) Cimetidine (Tagamet) Megestrol (Megace) NSAIDs (ibuprofen, naproxen, etc.) Piroxicam (Feldene) Propafenone (Rythmol SR) Propranolol (Inderal) Isoniazid (INH) Posaconazole (Noxafil) Barbiturates (Phenobarbital) Carbamazepine (Tegretol) Chlordiazepoxide (Librium) Cholestyramine (Questran) Griseofulvin Oral Contraceptives Rifampin Sucralfate (Carafate) Vitamin K   Coumadin (Warfarin) Major Herbal Interactions  Increased Warfarin Effect Decreased Warfarin Effect  Garlic Ginseng Ginkgo biloba Coenzyme Q10 Green tea St. Johns wort    Coumadin (Warfarin) FOOD Interactions  Eat a consistent number of servings per week of foods HIGH in Vitamin K (1 serving =  cup)  Collards (cooked, or boiled & drained) Kale (cooked, or boiled & drained) Mustard greens (cooked, or boiled & drained) Parsley *serving size only =  cup Spinach (cooked, or boiled & drained) Swiss chard (cooked, or boiled & drained) Turnip greens (cooked, or boiled & drained)  Eat a consistent number of servings per week of foods MEDIUM-HIGH in Vitamin K (1 serving = 1 cup)  Asparagus (cooked, or boiled & drained) Broccoli (cooked, boiled & drained, or raw & chopped) Brussel sprouts (cooked, or boiled & drained) *serving size only =  cup Lettuce, raw (green leaf, endive, romaine) Spinach, raw Turnip greens, raw & chopped   These websites have more information on Coumadin (warfarin):  FailFactory.se; VeganReport.com.au;   Preventing pneumonia  Take deep breaths or do breathing exercises as instructed by your health care provider.  Cough frequently. Coughing may cause discomfort, but it is important to clear mucus (phlegm) and expand your lungs. If coughing hurts, hold a pillow against your chest or place both hands flat on top of the incision (splinting) when you cough. This may help relieve discomfort.  Continue to use an incentive spirometer as directed. This is a tool that  measures how well you fill your lungs with each breath.  Participate in pulmonary rehabilitation as directed. This is a program that combines education, exercise, and support from a team of specialists. The goal is to help you heal and return to normal activities as soon as possible. Medicines  Take over-the-counter or prescription medicines only as told by your health care provider.  If you have pain, take pain-relieving medicine before your pain becomes severe. This is important because if your pain is under control, you will be able to breathe and cough more comfortably.  If you were prescribed an antibiotic medicine, take it as told by your health care provider. Do not stop taking the antibiotic even if you start to feel better. Activity  Ask your health care provider what activities are safe for you.  Do not travel by airplane for 2 weeks after your chest tube is removed, or until your health care provider says that this is safe.  Do not lift anything that is heavier than 10 lb (4.5 kg), or the limit that your health care provider tells you, until he or she says that it is safe.  Do not drive until your health care provider approves.  Do not drive or use heavy machinery while taking prescription pain medicine. Incision care  Follow instructions from your health care provider about how to take  care of your incision. Make sure you:  Wash your hands with soap and water before you change your bandage (dressing). If soap and water are not available, use hand sanitizer.  Change your dressing as told by your health care provider.  Leave stitches (sutures), skin glue, or adhesive strips in place. These skin closures may need to stay in place for 2 weeks or longer. If adhesive strip edges start to loosen and curl up, you may trim the loose edges. Do not remove adhesive strips completely unless your health care provider tells you to do that.  Keep your dressing dry.  Check your incision  area every day for signs of infection. Check for:  More redness, swelling, or pain.  More fluid or blood.  Warmth.  Pus or a bad smell. Bathing  Do not take baths, swim, or use a hot tub until your health care provider approves. You may take showers.  After your dressing has been removed, use soap and water to gently wash your incision area. Do not use anything else to clean your incision unless your health care provider tells you to do that. Eating and drinking  Eat a healthy diet as instructed by your health care provider. A healthy diet includes plenty of fresh fruits and vegetables, whole grains, and low-fat (lean) proteins.  Drink enough fluid to keep your urine clear or pale yellow. General instructions  To prevent or treat constipation while you are taking prescription pain medicine, your health care provider may recommend that you:  Take over-the-counter or prescription medicines.  Eat foods that are high in fiber, such as fresh fruits and vegetables, whole grains, and beans.  Limit foods that are high in fat and processed sugars, such as fried and sweet foods.  Do not use any products that contain nicotine or tobacco, such as cigarettes and e-cigarettes. If you need help quitting, ask your health care provider.  Avoid secondhand smoke.  Wear compression stockings as told by your health care provider. These stockings help to prevent blood clots and reduce swelling in your legs.  If you have a chest tube, care for it as instructed.  Keep all follow-up visits as told by your health care provider. This is important. Contact a health care provider if:  You have more redness, swelling, or pain around your incision.  You have more fluid or blood coming from your incision.  Your incision feels warm to the touch.  You have pus or a bad smell coming from your incision.  You have a fever or chills.  Your heartbeat seems irregular.  You have nausea or vomiting.  You  have muscle aches.  You are constipated. This may mean that you have:  Fewer bowel movements in a week than normal.  Difficulty having a bowel movement.  Stools that are dry, hard, or larger than normal. Get help right away if:  You develop a rash.  You feel light-headed or feel like you are going to faint.  You have shortness of breath or trouble breathing.  You are confused.  You have trouble speaking.  You have vision problems.  You are not able to move.  You have numbness in your face, arms, or legs.  You lose consciousness.  You have a sudden, severe headache.  You feel weak.  You have chest pain.  You have pain that:  Is severe.  Gets worse, even with medicine. Summary  To prevent pneumonia, take deep breaths, do breathing exercises, and cough frequently,  as instructed by your health care provider.  Do not drive until your health care provider approves. Do not travel by airplane for 2 weeks after your chest tube is removed, or until your health care provider approves.  Check your incision area every day for signs of infection.  Eat a healthy diet that includes plenty of fresh fruits and vegetables, whole grains, and low-fat (lean) proteins. This information is not intended to replace advice given to you by your health care provider. Make sure you discuss any questions you have with your health care provider. Document Released: 03/16/2011 Document Revised: 06/25/2016 Document Reviewed: 06/25/2016 Elsevier Interactive Patient Education  2017 [employer].

## 2016-12-02 LAB — CBC
HCT: 27.9 % — ABNORMAL LOW (ref 36.0–46.0)
Hemoglobin: 9.1 g/dL — ABNORMAL LOW (ref 12.0–15.0)
MCH: 31.3 pg (ref 26.0–34.0)
MCHC: 32.6 g/dL (ref 30.0–36.0)
MCV: 95.9 fL (ref 78.0–100.0)
PLATELETS: 244 10*3/uL (ref 150–400)
RBC: 2.91 MIL/uL — ABNORMAL LOW (ref 3.87–5.11)
RDW: 17.6 % — AB (ref 11.5–15.5)
WBC: 10.5 10*3/uL (ref 4.0–10.5)

## 2016-12-02 LAB — GLUCOSE, CAPILLARY
Glucose-Capillary: 135 mg/dL — ABNORMAL HIGH (ref 65–99)
Glucose-Capillary: 124 mg/dL — ABNORMAL HIGH (ref 65–99)
Glucose-Capillary: 128 mg/dL — ABNORMAL HIGH (ref 65–99)
Glucose-Capillary: 127 mg/dL — ABNORMAL HIGH (ref 65–99)
Glucose-Capillary: 98 mg/dL (ref 65–99)
Glucose-Capillary: 135 mg/dL — ABNORMAL HIGH (ref 65–99)

## 2016-12-02 LAB — PROTIME-INR
INR: 1.17
PROTHROMBIN TIME: 15 s (ref 11.4–15.2)

## 2016-12-02 LAB — HEPARIN LEVEL (UNFRACTIONATED): HEPARIN UNFRACTIONATED: 0.5 [IU]/mL (ref 0.30–0.70)

## 2016-12-02 MED ORDER — WARFARIN SODIUM 5 MG PO TABS
5.0000 mg | ORAL_TABLET | Freq: Once | ORAL | Status: AC
  Administered 2016-12-02: 5 mg via ORAL
  Filled 2016-12-02: qty 1

## 2016-12-02 NOTE — Progress Notes (Signed)
Speech Language Pathology Treatment: Dysphagia  Patient Details Name: [name] MRN: [id number] DOB: 03-02-1946 Today's Date: 12/02/2016 Time: 7579-7282 SLP Time Calculation (min) (ACUTE ONLY): 23 min  Assessment / Plan / Recommendation Clinical Impression  ST follow-up for therapeutic/diagnostic feedings and therapeutic swallowing exercises at request of RN who states pt hopeful to be able to reduce tube feedings and increase PO intake. Patient states she has been trying to complete effortful swallows throughout the day, however "my mouth is so dry it's hard to do." Assisted patient with repositioning and oral care to maximize safety for PO trials. She is able to verbalize swallowing precautions independently when asked. Patient requests assistance for holding cup due to fatigue/weakness. With initial cup sip of nectar-thick liquid, patient with delayed cough despite L head turn, dry swallow. She states, "I think I got too much." Provided further trials of nectar-thick liquids and pureed solids via teaspoon with no further signs of reduced airway protection. Patient demonstrates good technique and adherence to swallowing precautions. She was able to take in approximately 1 oz nectar-thick liquids and 4 bites of pureed solids before stating she was too full to continue. Recommend continuing current plan of care, SLP to follow up to determine appropriateness for diet initiation.   HPI HPI: 71 y.o. 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 Initially was ambulatory in the ICU and out of bed without difficulty. She later had worsening hypoxia and respiratory status prompting initiation of BiPAP support and PCCM consultation.  Course complicated by shock thought to be sepsis from PNA but no positive cultures, AF with RVR, bilateral LE DVT.   Intubated for hypoxic respiratory failure 1/26.        SLP Plan  Continue with current plan of care     Recommendations  Diet recommendations: Other(comment) (4 oz dysphagia 1 and 4 ozs nectar thick liquids 3 times/day) Liquids provided via: Cup Medication Administration: Via alternative means Supervision: Patient able to self feed;Full supervision/cueing for compensatory strategies Compensations: Slow rate;Small sips/bites;Multiple dry swallows after each bite/sip;Other (Comment) Postural Changes and/or Swallow Maneuvers: Head turn left during swallow;Seated upright 90 degrees;Upright 30-60 min after meal                Oral Care Recommendations: Oral care before and after PO Follow up Recommendations: Inpatient Rehab Plan: Continue with current plan of care       [place], [place] CF-SLP Speech-Language Pathologist [phone] Aliene Altes 12/02/2016, 5:29 PM

## 2016-12-02 NOTE — Progress Notes (Addendum)
[address]       [place] [id number]             [phone]      27 Days Post-Op Procedure(s) (LRB): VIDEO BRONCHOSCOPY WITH INSERTION OF INTERBRONCHIAL VALVE (IBV) times three (N/A) Subjective: Feels a bit stronger   Objective: Vital signs in last 24 hours: Temp:  [97.3 F (36.3 C)-98.9 F (37.2 C)] 98 F (36.7 C) (02/18 0834) Pulse Rate:  [77-84] 77 (02/18 0834) Cardiac Rhythm: Normal sinus rhythm (02/18 0834) Resp:  [19-25] 24 (02/18 0834) BP: (137-168)/(73-86) 161/76 (02/18 0834) SpO2:  [95 %-100 %] 97 % (02/18 0834) Weight:  [133 lb 9.6 oz (60.6 kg)] 133 lb 9.6 oz (60.6 kg) (02/18 0306)  Hemodynamic parameters for last 24 hours:    Intake/Output from previous day: 02/17 0701 - 02/18 0700 In: 1655.1 [I.V.:468.4; NG/GT:1186.7] Out: 1050 [Urine:1050] Intake/Output this shift: No intake/output data recorded.  General appearance: alert, cooperative and no distress Heart: regular rate and rhythm Lungs: din right lower fields Abdomen: benign Extremities: min edema Wound: incis healing well  Lab Results:  Recent Labs  12/01/16 0500 12/02/16 0330  WBC 8.8 10.5  HGB 9.0* 9.1*  HCT 28.6* 27.9*  PLT 273 244   BMET:  Recent Labs  12/01/16 0500  NA 139  K 3.9  CL 100*  CO2 32  GLUCOSE 135*  BUN 14  CREATININE 0.77  CALCIUM 8.7*    PT/INR:  Recent Labs  12/02/16 0330  LABPROT 15.0  INR 1.17   ABG    Component Value Date/Time   PHART 7.389 11/22/2016 1415   HCO3 35.1 (H) 11/22/2016 1415   TCO2 28 11/12/2016 1519   ACIDBASEDEF 5.0 (H) 11/11/2016 0423   O2SAT 90.4 11/22/2016 1415   CBG (last 3)   Recent Labs  12/01/16 1944 12/01/16 2319 12/02/16 0303  GLUCAP 123* 109* 135*    Meds Scheduled Meds: . amiodarone  200 mg Per Tube BID  . chlorhexidine  15 mL Mouth Rinse BID  . Chlorhexidine Gluconate Cloth  6 each Topical Daily  . feeding supplement (PRO-STAT SUGAR FREE 64)  30 mL Per Tube Daily  . free water   200 mL Per Tube Q6H  . Gerhardt's butt cream   Topical BID  . insulin aspart  0-15 Units Subcutaneous Q4H  . ipratropium  0.5 mg Nebulization QID  . levalbuterol  0.63 mg Nebulization QID  . magic mouthwash  5 mL Oral TID  . mouth rinse  15 mL Mouth Rinse q12n4p  . pantoprazole sodium  40 mg Per Tube Q1200  . sodium chloride flush  10-40 mL Intracatheter Q12H  . Warfarin - Physician Dosing Inpatient   Does not apply q1800   Continuous Infusions: . sodium chloride 10 mL/hr at 12/02/16 0300  . dextrose 10 mL/hr at 11/30/16 0700  . feeding supplement (OSMOLITE 1.5 CAL) 1,000 mL (12/02/16 0600)  . heparin 1,100 Units/hr (12/02/16 0600)   PRN Meds:.acetaminophen, fentaNYL (SUBLIMAZE) injection, levalbuterol, lip balm, potassium chloride (KCL MULTIRUN) 30 mEq in 265 mL IVPB, RESOURCE THICKENUP CLEAR, sodium chloride, sodium chloride flush, zolpidem  Xrays Dg Chest Port 1 View  Result Date: 12/01/2016 CLINICAL DATA:  Postoperative change with atelectasis EXAM: PORTABLE CHEST 1 VIEW COMPARISON:  November 30, 2016 FINDINGS: There is consolidation and volume loss on the right, stable. Asymmetric pulmonary edema on the right noted. Left lung is hyperexpanded and clear. Heart size and pulmonary vascularity are within normal limits.  There is an apparent small pleural effusion on the right. Central catheter tip is in the superior vena cava. Feeding tube tip is below the diaphragm. There is aortic atherosclerosis. No demonstrable adenopathy. Bony structures appear intact. IMPRESSION: Persistent postoperative change on the right with volume loss and asymmetric edema. There is an apparent small pleural effusion on the right. Left lung clear. Stable cardiac silhouette. There is aortic atherosclerosis. Electronically Signed   By: Lowella Grip III M.D.   On: 12/01/2016 07:49    Assessment/Plan: S/P Procedure(s) (LRB): VIDEO BRONCHOSCOPY WITH INSERTION OF INTERBRONCHIAL VALVE (IBV) times three (N/A)  1  steady slow progress 2 sinus rhythm 3 conts coumadin/heparin for afib/DVT, INR rising 4 no fevers or leukocytosis, H/H is stable 5 CIR has evaluated, poss tx soon 6 cbg's adeq controlled  LOS: 34 days    GOLD,WAYNE E 12/02/2016   Chart reviewed, patient examined, agree with above. INR 1.17 after two days of 2.5 mg. Will give 5 mg Coumadin tonight. She is therapeutic on heparin.

## 2016-12-02 NOTE — Progress Notes (Signed)
ANTICOAGULATION CONSULT NOTE - FOLLOW UP  Pharmacy Consult for:  Heparin Indication: DVT and atrial fibrillation  Allergies  Allergen Reactions  . Nickel Rash    Patient Measurements: Height: 5' 3.5" (161.3 cm) Weight: 133 lb 9.6 oz (60.6 kg) IBW/kg (Calculated) : 53.55 Heparin Dosing Weight: 64.4 kg  Vital Signs: Temp: 98.3 F (36.8 C) (02/18 0306) Temp Source: Oral (02/18 0306) BP: 137/83 (02/18 0306) Pulse Rate: 84 (02/18 0306)  Labs:  Recent Labs  11/30/16 0330 12/01/16 0500 12/02/16 0330  HGB 8.8* 9.0* 9.1*  HCT 27.8* 28.6* 27.9*  PLT 282 273 244  LABPROT  --  13.7 15.0  INR  --  1.05 1.17  HEPARINUNFRC 0.32 0.53 0.50  CREATININE  --  0.77  --     Estimated Creatinine Clearance: 54.6 mL/min (by C-G formula based on SCr of 0.77 mg/dL).  Medications: Heparin @ 1100 units/hr  Assessment: 34 yof continues on heparin for new bilateral peroneal DVTs and afib. Negative for HIT.  Heparin level is therapeutic at 0.50 this morning on 1100 units/hr.  CBC stable; no bleeding documented. Of note, warfarin started 2/16 per MD.    Goal of Therapy:  Heparin level 0.3- 0.7 units/mL Monitor platelets by anticoagulation protocol: Yes    Plan:  Continue  heparin at 1100 units/hr Daily heparin level and CBC Monitor for signs and symptoms of bleeding   Uvaldo Bristle, PharmD PGY1 Pharmacy Resident Pager: [phone]  12/02/2016, 7:47 AM

## 2016-12-03 LAB — CBC
HEMATOCRIT: 29.9 % — AB (ref 36.0–46.0)
HEMOGLOBIN: 9.5 g/dL — AB (ref 12.0–15.0)
MCH: 30.5 pg (ref 26.0–34.0)
MCHC: 31.8 g/dL (ref 30.0–36.0)
MCV: 96.1 fL (ref 78.0–100.0)
Platelets: 266 10*3/uL (ref 150–400)
RBC: 3.11 MIL/uL — ABNORMAL LOW (ref 3.87–5.11)
RDW: 17.7 % — AB (ref 11.5–15.5)
WBC: 11.9 10*3/uL — AB (ref 4.0–10.5)

## 2016-12-03 LAB — GLUCOSE, CAPILLARY
Glucose-Capillary: 105 mg/dL — ABNORMAL HIGH (ref 65–99)
Glucose-Capillary: 138 mg/dL — ABNORMAL HIGH (ref 65–99)
Glucose-Capillary: 88 mg/dL (ref 65–99)
Glucose-Capillary: 113 mg/dL — ABNORMAL HIGH (ref 65–99)
Glucose-Capillary: 110 mg/dL — ABNORMAL HIGH (ref 65–99)
Glucose-Capillary: 147 mg/dL — ABNORMAL HIGH (ref 65–99)

## 2016-12-03 LAB — HEPARIN LEVEL (UNFRACTIONATED): Heparin Unfractionated: 0.59 IU/mL (ref 0.30–0.70)

## 2016-12-03 LAB — PROTIME-INR
INR: 1.2
Prothrombin Time: 15.3 seconds — ABNORMAL HIGH (ref 11.4–15.2)

## 2016-12-03 MED ORDER — IPRATROPIUM BROMIDE 0.02 % IN SOLN
0.5000 mg | Freq: Three times a day (TID) | RESPIRATORY_TRACT | Status: DC
  Administered 2016-12-03 – 2016-12-05 (×5): 0.5 mg via RESPIRATORY_TRACT
  Filled 2016-12-03 (×7): qty 2.5

## 2016-12-03 MED ORDER — LEVALBUTEROL HCL 0.63 MG/3ML IN NEBU
0.6300 mg | INHALATION_SOLUTION | Freq: Three times a day (TID) | RESPIRATORY_TRACT | Status: DC
  Administered 2016-12-03 – 2016-12-05 (×5): 0.63 mg via RESPIRATORY_TRACT
  Filled 2016-12-03 (×7): qty 3

## 2016-12-03 MED ORDER — OSMOLITE 1.5 CAL PO LIQD
1000.0000 mL | ORAL | Status: DC
  Administered 2016-12-03 – 2016-12-04 (×2): 1000 mL
  Filled 2016-12-03 (×4): qty 1000

## 2016-12-03 MED ORDER — OXYCODONE HCL 5 MG/5ML PO SOLN
5.0000 mg | ORAL | Status: DC | PRN
  Administered 2016-12-03: 5 mg
  Filled 2016-12-03: qty 5

## 2016-12-03 MED ORDER — WARFARIN SODIUM 5 MG PO TABS: 5.0000 mg | ORAL_TABLET | Freq: Once | ORAL | Status: DC

## 2016-12-03 MED ORDER — ACETAMINOPHEN 160 MG/5ML PO SOLN
325.0000 mg | ORAL | Status: DC | PRN
  Administered 2016-12-03: 325 mg
  Filled 2016-12-03: qty 20.3

## 2016-12-03 NOTE — Progress Notes (Signed)
ANTICOAGULATION CONSULT NOTE - FOLLOW UP  Pharmacy Consult for:  Heparin Indication: DVT and atrial fibrillation  Allergies  Allergen Reactions  . Nickel Rash    Patient Measurements: Height: 5' 3.5" (161.3 cm) Weight: 133 lb 13.1 oz (60.7 kg) IBW/kg (Calculated) : 53.55 Heparin Dosing Weight: 64.4 kg  Vital Signs: Temp: 97.9 F (36.6 C) (02/19 0728) Temp Source: Oral (02/19 0728) BP: 143/78 (02/19 0728) Pulse Rate: 81 (02/19 0728)  Labs:  Recent Labs  12/01/16 0500 12/02/16 0330 12/03/16 0247  HGB 9.0* 9.1* 9.5*  HCT 28.6* 27.9* 29.9*  PLT 273 244 266  LABPROT 13.7 15.0 15.3*  INR 1.05 1.17 1.20  HEPARINUNFRC 0.53 0.50 0.59  CREATININE 0.77  --   --     Estimated Creatinine Clearance: 54.6 mL/min (by C-G formula based on SCr of 0.77 mg/dL).  Medications: Heparin @ 1100 units/hr  Assessment: 9 yof continues on heparin for new bilateral peroneal DVTs and afib. Negative for HIT.  Heparin level is therapeutic at 0.59 this morning on 1100 units/hr.  CBC stable; no bleeding documented. Of note, warfarin started 2/16 per MD. INR 1.2 today - no warfarin dose entered by MD yet.  Goal of Therapy:  Heparin level 0.3- 0.7 units/mL Monitor platelets by anticoagulation protocol: Yes   Plan:  Continue  heparin at 1100 units/hr Daily heparin level and CBC Monitor for signs and symptoms of bleeding  Warfarin per MD - no dose entered yet today  Thank you for allowing pharmacy to be a part of this patient's care.  Alycia Rossetti, PharmD, BCPS Clinical Pharmacist Pager: [phone] Clinical phone for 12/03/2016 from 7a-3:30p: [phone] If after 3:30p, please call main pharmacy at: [phone] 12/03/2016 10:11 AM

## 2016-12-03 NOTE — Progress Notes (Addendum)
Nutrition Follow Up  DOCUMENTATION CODES:   Not applicable  INTERVENTION:    Osmolite 1.5 formula at 50 ml/hr x 12 hours (infuse from 7 PM to 7 AM)   D/C Prostat liquid protein   Above TF regimen to provide 900 kcals, 38 gm protein, 457 ml of free water Meets 50% of patient's re-estimated nutritional needs  NEW NUTRITION DIAGNOSIS:   Increased nutrient needs related to acute illness (debility) as evidenced by estimated needs, ongoing  GOAL:   Patient will meet greater than or equal to 90% of their needs, met  MONITOR:   TF tolerance, PO intake, Labs, Weight trends, I & O's  ASSESSMENT:   71 y.o. Female with septic shock and acute hypoxic respiratory failure. Septic source is likely pneumonia. Patient now developing delirium and mild hypercarbia given recent ABG. Lactic acidosis is improving from procalcitonin remains significantly elevated.   Pt s/p procedures 1/22: VIDEO BRONCHOSCOPY WITH INSERTION OF INTERBRONCHIAL VALVE (IBV) x 3  Pt s/p dysphagia treatment today >> SLP rec Dys 1-Nectar thick liquids. Osmolite 1.5 formula currently off via CORTRAK tube >> previously infusing at goal rate of 50 ml/hr. Pt c/o feeling full >> spoke with RN >> request to transition to nocturnal feeding regimen. Free water flushes at 200 ml q 6 hrs. CBG's [id number].  Diet Order:  DIET - DYS 1 Room service appropriate? Yes; Fluid consistency: Nectar Thick  Skin:  Wound (see comment) (skin tear to R buttocks)  Last BM:  2/19  Height:   Ht Readings from Last 1 Encounters:  11/30/16 5' 3.5" (1.613 m)   Weight:  Wt Readings from Last 1 Encounters:  12/03/16 133 lb 13.1 oz (60.7 kg)   BMI:  Body mass index is 23.33 kg/m.  Re-estimated Nutritional Needs:   Kcal:  1700-1900  Protein:  75-85 gm  Fluid:  1.7-1.9 L  EDUCATION NEEDS:   No education needs identified at this time  Arthur Holms, RD, LDN Pager #: [phone] After-Hours Pager #: [phone]

## 2016-12-03 NOTE — Progress Notes (Addendum)
      [address]       [place] [address]             [phone]       28 Days Post-Op Procedure(s) (LRB): VIDEO BRONCHOSCOPY WITH INSERTION OF INTERBRONCHIAL VALVE (IBV) times three (N/A)  Subjective: Patient hopes feeding tube is removed soon  Objective: Vital signs in last 24 hours: Temp:  [97.9 F (36.6 C)-98.8 F (37.1 C)] 97.9 F (36.6 C) (02/19 0728) Pulse Rate:  [79-91] 81 (02/19 0728) Cardiac Rhythm: Normal sinus rhythm (02/19 0711) Resp:  [16-25] 16 (02/19 0728) BP: (143-160)/(78-88) 143/78 (02/19 0728) SpO2:  [88 %-100 %] 97 % (02/19 0728) Weight:  [133 lb 13.1 oz (60.7 kg)] 133 lb 13.1 oz (60.7 kg) (02/19 0418)     Intake/Output from previous day: 02/18 0701 - 02/19 0700 In: 2073.5 [I.V.:530.2; NG/GT:1543.3] Out: 1850 [Urine:1850]   Physical Exam:  Cardiovascular: RRR Pulmonary: Diminished at right base Abdomen: Soft, non tender, bowel sounds present. Extremities: Trace bilateral lower extremity edema. Wounds: Clean and dry.     Lab Results: CBC:  Recent Labs  12/02/16 0330 12/03/16 0247  WBC 10.5 11.9*  HGB 9.1* 9.5*  HCT 27.9* 29.9*  PLT 244 266   BMET:   Recent Labs  12/01/16 0500  NA 139  K 3.9  CL 100*  CO2 32  GLUCOSE 135*  BUN 14  CREATININE 0.77  CALCIUM 8.7*    PT/INR:   Recent Labs  12/03/16 0247  LABPROT 15.3*  INR 1.20   ABG:  INR: Will add last result for INR, ABG once components are confirmed Will add last 4 CBG results once components are confirmed  Assessment/Plan:  1. CV - SR. On Heparin drip and Coumadin (bilateral DVTs, previous a fib). Continue with Coumadin 5 mg. 2.  Pulmonary - On 2 liters of oxygen via [place]. CXR not ordered for today. Will order for am. 3. GI-TFs at 50 ml/hr. SLP will help determine advancement of diet. 4. Anemia-H and H stable at 9.5 and 29.9 5. Will discuss whether to remove central line with Dr. Servando Snare.   Central line out soon  Change to night tube  feeding  Pa and lat chest xray in am Converting to coumadin inr 1.2 Hope to get to inpatient rehab  later this week I have seen and examined [name] and agree with the above assessment  and plan.  [name] MD Beeper [phone] Office [phone] 12/03/2016 11:55 AM    .

## 2016-12-03 NOTE — Care Management Important Message (Signed)
Important Message  Patient Details  Name: [name] MRN: [id number] Date of Birth: 04-Sep-1946   Medicare Important Message Given:  Yes    Nathen May 12/03/2016, 12:20 PM

## 2016-12-03 NOTE — Progress Notes (Signed)
Rehab admissions - I met with patient and gave her rehab booklets.  I explained inpatient rehab to her.  I spoke with Dr. Servando Snare who feels patient will not be ready for CIR until Wed or Thurs this week.  I will continue to follow for progress.  Call me for questions.  #[phone]

## 2016-12-03 NOTE — Progress Notes (Signed)
Speech Language Pathology Treatment: Dysphagia  Patient Details Name: [name] MRN: [id number] DOB: 18-May-1946 Today's Date: 12/03/2016 Time: 4944-9675 SLP Time Calculation (min) (ACUTE ONLY): 15 min  Assessment / Plan / Recommendation Clinical Impression  Patient alert and cooperative, able to self feed clinician provided po trials, puree and nectar thick liquids, with independent use of swallowing precautions including slow rate of intake, small bolus size, left head turn and multiple swallows with each bite/sip. Period delayed cough noted of questionable origin. Did not necessarily appear related to swallow as patient with baseline cough. Patient continues to have limited po intake (3-4 sips of nectar thick liquid and 3 bites of pureed solid) before reporting satiety. Fatigue continues to be a limiting factor however improving. Oral cavity also improving in appearance. At this time, appears to be tolerating limited pos. Recommend initiation of a po diet as noted below. Tube feeding will need to be adjusted in order to allow for po intake. Discussed with RN. Will continue to f/u.    HPI HPI: 71 y.o. 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 Initially was ambulatory in the ICU and out of bed without difficulty. She later had worsening hypoxia and respiratory status prompting initiation of BiPAP support and PCCM consultation.  Course complicated by shock thought to be sepsis from PNA but no positive cultures, AF with RVR, bilateral LE DVT.  Intubated for hypoxic respiratory failure 1/26.        SLP Plan  Continue with current plan of care     Recommendations  Diet recommendations: Dysphagia 1 (puree);Nectar-thick liquid Liquids provided via: Cup;No straw Medication Administration: Crushed with puree Supervision: Patient able to  self feed;Full supervision/cueing for compensatory strategies Compensations: Slow rate;Small sips/bites;Multiple dry swallows after each bite/sip;Other (Comment) (LEFT HEAD TURN WITH ALL BITES AND SIPS) Postural Changes and/or Swallow Maneuvers: Head turn left during swallow;Seated upright 90 degrees;Upright 30-60 min after meal                Oral Care Recommendations: Oral care before and after PO Follow up Recommendations: Inpatient Rehab Plan: Continue with current plan of care       [place], [place] [phone]    [place] 12/03/2016, 9:46 AM

## 2016-12-03 NOTE — Progress Notes (Signed)
Physical Therapy Treatment Patient Details Name: [name] MRN: [id number] DOB: Aug 16, 1946 Today's Date: 12/03/2016    History of Present Illness 71 y.o. 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 Initially was ambulatory in the ICU and out of bed without difficulty. She later had worsening hypoxia and  Intubated for hypoxic respiratory failure 2/75  Course complicated by shock thought to be sepsis from PNA but no positive cultures, AF with RVR, bilateral LE DVT.  Patient extubated 11/14/16.    PT Comments    Pt making good, steady progress. Continues to need to work toward [place] and functional activity tolerance. Continue to feel CIR appropriate.  Follow Up Recommendations  CIR;Supervision for mobility/OOB     Equipment Recommendations  None recommended by PT    Recommendations for Other Services       Precautions / Restrictions Precautions Precautions: Fall Restrictions Weight Bearing Restrictions: No    Mobility  Bed Mobility                  Transfers Overall transfer level: Needs assistance Equipment used: 4-wheeled walker Transfers: Sit to/from Stand Sit to Stand: Min guard         General transfer comment: Assist for balance and safety  Ambulation/Gait Ambulation/Gait assistance: Min guard Ambulation Distance (Feet): 140 Feet Assistive device: Rolling walker (2 wheeled) Gait Pattern/deviations: Step-through pattern;Decreased stride length;Trunk flexed Gait velocity: decr Gait velocity interpretation: Below normal speed for age/gender General Gait Details: Assist for balance and safety. Pt amb on RA with SpO2 >90% when good waveform detected   Stairs            Wheelchair Mobility    Modified Rankin (Stroke Patients Only)       Balance Overall balance  assessment: Needs assistance Sitting-balance support: No upper extremity supported;Feet supported Sitting balance-Leahy Scale: Good     Standing balance support: No upper extremity supported Standing balance-Leahy Scale: Fair                      Cognition Arousal/Alertness: Awake/alert Behavior During Therapy: WFL for tasks assessed/performed Overall Cognitive Status: Within Functional Limits for tasks assessed                      Exercises      General Comments        Pertinent Vitals/Pain Pain Assessment: No/denies pain    Home Living                      Prior Function            PT Goals (current goals can now be found in the care plan section) Progress towards PT goals: Progressing toward goals    Frequency    Min 3X/week      PT Plan Current plan remains appropriate    Co-evaluation             End of Session   Activity Tolerance: Patient tolerated treatment well Patient left: with call bell/phone within reach;in chair;Other (comment) (SLP in room)     Time: 1700-1749 PT Time Calculation (min) (ACUTE ONLY): 22 min  Charges:  $Gait Training: 8-22 mins                    G Codes:  Shary Decamp [place] 12/03/2016, 9:43 AM Suanne Marker PT [phone]

## 2016-12-03 NOTE — Care Management Note (Addendum)
Case Management Note  Patient Details  Name: [name] MRN: [id number] Date of Birth: 09-11-1946  Subjective/Objective:    28 days pos op Video Bronchoscopy with insertion of interbronchial valve  X 3, bil DVT's,  Per speech eval today, rec dysp 1 nectar thick diet and cortrak tube feeds has been cut off during the day and will be on during the night.  She conts on heparin drip and coumadin, on 2 liters o2, for cxr in am, plan is for CIR this week. She lives with spouse at home, she has a PCP, Dr. Reynaldo Minium, she has 3 walkers and a cane and  A shower chair at home.   NCM will cont to follow for dc needs.                  Action/Plan:   Expected Discharge Date:                  Expected Discharge Plan:  IP Rehab Facility  In-House Referral:  Clinical Social Work  Discharge planning Services  CM Consult  Post Acute Care Choice:  NA Choice offered to:  NA  DME Arranged:  N/A DME Agency:  NA  HH Arranged:  NA HH Agency:  NA  Status of Service:  In process, will continue to follow  If discussed at Long Length of Stay Meetings, dates discussed:    Additional Comments:  Zenon Mayo, RN 12/03/2016, 3:07 PM

## 2016-12-04 ENCOUNTER — Inpatient Hospital Stay (HOSPITAL_COMMUNITY): Payer: Medicare Other

## 2016-12-04 LAB — GLUCOSE, CAPILLARY
Glucose-Capillary: 113 mg/dL — ABNORMAL HIGH (ref 65–99)
Glucose-Capillary: 104 mg/dL — ABNORMAL HIGH (ref 65–99)
Glucose-Capillary: 98 mg/dL (ref 65–99)
Glucose-Capillary: 87 mg/dL (ref 65–99)
Glucose-Capillary: 135 mg/dL — ABNORMAL HIGH (ref 65–99)
Glucose-Capillary: 126 mg/dL — ABNORMAL HIGH (ref 65–99)

## 2016-12-04 LAB — CBC
HEMATOCRIT: 29.8 % — AB (ref 36.0–46.0)
Hemoglobin: 9.5 g/dL — ABNORMAL LOW (ref 12.0–15.0)
MCH: 30.5 pg (ref 26.0–34.0)
MCHC: 31.9 g/dL (ref 30.0–36.0)
MCV: 95.8 fL (ref 78.0–100.0)
PLATELETS: 244 10*3/uL (ref 150–400)
RBC: 3.11 MIL/uL — ABNORMAL LOW (ref 3.87–5.11)
RDW: 17.4 % — AB (ref 11.5–15.5)
WBC: 10.5 10*3/uL (ref 4.0–10.5)

## 2016-12-04 LAB — PROTIME-INR
INR: 1.72
PROTHROMBIN TIME: 20.3 s — AB (ref 11.4–15.2)

## 2016-12-04 LAB — HEPARIN LEVEL (UNFRACTIONATED): Heparin Unfractionated: 0.59 IU/mL (ref 0.30–0.70)

## 2016-12-04 MED ORDER — WARFARIN SODIUM 2 MG PO TABS
2.0000 mg | ORAL_TABLET | Freq: Every day | ORAL | Status: DC
Start: 2016-12-04 — End: 2016-12-05
  Administered 2016-12-04: 2 mg via ORAL
  Filled 2016-12-04 (×2): qty 1

## 2016-12-04 NOTE — Progress Notes (Signed)
SLP Cancellation Note  Patient Details Name: LAVERTA HARNISCH MRN: [id number] DOB: 03-17-1946   Cancelled treatment:       Reason Eval/Treat Not Completed: Patient at procedure or test/unavailable   Crytal Pensinger, Katherene Ponto 12/04/2016, 8:24 AM

## 2016-12-04 NOTE — Progress Notes (Addendum)
      [address]       [place] [address]             [phone]       29 Days Post-Op Procedure(s) (LRB): VIDEO BRONCHOSCOPY WITH INSERTION OF INTERBRONCHIAL VALVE (IBV) times three (N/A)  Subjective: Patient states breathing seems to be a little more labored this am.  Objective: Vital signs in last 24 hours: Temp:  [98.2 F (36.8 C)-99.5 F (37.5 C)] 98.3 F (36.8 C) (02/20 0418) Pulse Rate:  [84-89] 88 (02/20 0418) Cardiac Rhythm: Supraventricular tachycardia;Other (Comment) (02/20 0318) Resp:  [18-29] 29 (02/20 0418) BP: (122-154)/(73-94) 154/83 (02/20 0418) SpO2:  [90 %-99 %] 98 % (02/20 0418) Weight:  [134 lb 4.2 oz (60.9 kg)] 134 lb 4.2 oz (60.9 kg) (02/20 0500)     Intake/Output from previous day: 02/19 0701 - 02/20 0700 In: 1572.9 [I.V.:516.3; NG/GT:1056.7] Out: 300 [Urine:300]   Physical Exam:  Cardiovascular: RRR Pulmonary: Diminished throughout Abdomen: Soft, non tender, bowel sounds present. Extremities: No lower extremity edema. Wounds: Clean and dry   Lab Results: CBC:  Recent Labs  12/03/16 0247 12/04/16 0235  WBC 11.9* 10.5  HGB 9.5* 9.5*  HCT 29.9* 29.8*  PLT 266 244   BMET:  No results for input(s): NA, K, CL, CO2, GLUCOSE, BUN, CREATININE, CALCIUM in the last 72 hours.  PT/INR:   Recent Labs  12/04/16 0235  LABPROT 20.3*  INR 1.72   ABG:  INR: Will add last result for INR, ABG once components are confirmed Will add last 4 CBG results once components are confirmed  Assessment/Plan:  1. CV - SR. On Heparin drip and Coumadin (bilateral DVTs, previous a fib). INR increased from 1.2 to 1.72 on Coumadin 5 mg. Will give 2 mg tonight and stop Heparin drip in am. 2.  Pulmonary - On 2 liters of oxygen via [place]. CXR ordered for today but not taken yet.  3. GI-TFs at 50 ml/hr at night only now. On dysphagia 1 diet.  4. Anemia-H and H stable at 9.5 and 29.8 5. Central line removed yesterday 6. To CIR in am, if bed  available    Plan CIR in am I have seen and examined [name] and agree with the above assessment  and plan.  [name] MD Beeper [phone] Office [phone] 12/04/2016 1:06 PM    .

## 2016-12-05 ENCOUNTER — Inpatient Hospital Stay (HOSPITAL_COMMUNITY)
Admission: RE | Admit: 2016-12-05 | Discharge: 2016-12-12 | DRG: 092 | Disposition: A | Discharge status: Home Health | Payer: Medicare Other | Source: Intra-hospital | Attending: Physical Medicine & Rehabilitation | Admitting: Physical Medicine & Rehabilitation

## 2016-12-05 ENCOUNTER — Inpatient Hospital Stay (HOSPITAL_COMMUNITY): Payer: Medicare Other

## 2016-12-05 DIAGNOSIS — R471 Dysarthria and anarthria: Secondary | ICD-10-CM | POA: Diagnosis present

## 2016-12-05 DIAGNOSIS — R1310 Dysphagia, unspecified: Secondary | ICD-10-CM | POA: Diagnosis present

## 2016-12-05 DIAGNOSIS — F419 Anxiety disorder, unspecified: Secondary | ICD-10-CM | POA: Diagnosis present

## 2016-12-05 DIAGNOSIS — G6281 Critical illness polyneuropathy: Secondary | ICD-10-CM | POA: Diagnosis present

## 2016-12-05 DIAGNOSIS — D638 Anemia in other chronic diseases classified elsewhere: Secondary | ICD-10-CM

## 2016-12-05 DIAGNOSIS — Z79899 Other long term (current) drug therapy: Secondary | ICD-10-CM

## 2016-12-05 DIAGNOSIS — I82409 Acute embolism and thrombosis of unspecified deep veins of unspecified lower extremity: Secondary | ICD-10-CM

## 2016-12-05 DIAGNOSIS — Z87891 Personal history of nicotine dependence: Secondary | ICD-10-CM | POA: Diagnosis not present

## 2016-12-05 DIAGNOSIS — D62 Acute posthemorrhagic anemia: Secondary | ICD-10-CM

## 2016-12-05 DIAGNOSIS — C3411 Malignant neoplasm of upper lobe, right bronchus or lung: Secondary | ICD-10-CM | POA: Diagnosis present

## 2016-12-05 DIAGNOSIS — Z96641 Presence of right artificial hip joint: Secondary | ICD-10-CM | POA: Diagnosis present

## 2016-12-05 DIAGNOSIS — K5900 Constipation, unspecified: Secondary | ICD-10-CM | POA: Diagnosis present

## 2016-12-05 DIAGNOSIS — R5381 Other malaise: Secondary | ICD-10-CM | POA: Diagnosis not present

## 2016-12-05 DIAGNOSIS — I10 Essential (primary) hypertension: Secondary | ICD-10-CM | POA: Diagnosis present

## 2016-12-05 DIAGNOSIS — G7281 Critical illness myopathy: Principal | ICD-10-CM | POA: Diagnosis present

## 2016-12-05 DIAGNOSIS — Z91048 Other nonmedicinal substance allergy status: Secondary | ICD-10-CM | POA: Diagnosis not present

## 2016-12-05 DIAGNOSIS — I480 Paroxysmal atrial fibrillation: Secondary | ICD-10-CM

## 2016-12-05 DIAGNOSIS — E785 Hyperlipidemia, unspecified: Secondary | ICD-10-CM | POA: Diagnosis present

## 2016-12-05 DIAGNOSIS — I82403 Acute embolism and thrombosis of unspecified deep veins of lower extremity, bilateral: Secondary | ICD-10-CM | POA: Diagnosis present

## 2016-12-05 DIAGNOSIS — D649 Anemia, unspecified: Secondary | ICD-10-CM | POA: Diagnosis present

## 2016-12-05 DIAGNOSIS — Z961 Presence of intraocular lens: Secondary | ICD-10-CM | POA: Diagnosis present

## 2016-12-05 DIAGNOSIS — Z952 Presence of prosthetic heart valve: Secondary | ICD-10-CM

## 2016-12-05 DIAGNOSIS — Z90710 Acquired absence of both cervix and uterus: Secondary | ICD-10-CM | POA: Diagnosis not present

## 2016-12-05 DIAGNOSIS — R0602 Shortness of breath: Secondary | ICD-10-CM | POA: Diagnosis not present

## 2016-12-05 DIAGNOSIS — I82493 Acute embolism and thrombosis of other specified deep vein of lower extremity, bilateral: Secondary | ICD-10-CM

## 2016-12-05 DIAGNOSIS — C3491 Malignant neoplasm of unspecified part of right bronchus or lung: Secondary | ICD-10-CM

## 2016-12-05 DIAGNOSIS — I4891 Unspecified atrial fibrillation: Secondary | ICD-10-CM | POA: Diagnosis present

## 2016-12-05 DIAGNOSIS — G2581 Restless legs syndrome: Secondary | ICD-10-CM | POA: Diagnosis present

## 2016-12-05 DIAGNOSIS — K5901 Slow transit constipation: Secondary | ICD-10-CM | POA: Diagnosis not present

## 2016-12-05 DIAGNOSIS — J449 Chronic obstructive pulmonary disease, unspecified: Secondary | ICD-10-CM | POA: Diagnosis present

## 2016-12-05 LAB — HEPARIN LEVEL (UNFRACTIONATED): HEPARIN UNFRACTIONATED: 0.66 [IU]/mL (ref 0.30–0.70)

## 2016-12-05 LAB — CBC
HEMATOCRIT: 29.7 % — AB (ref 36.0–46.0)
Hemoglobin: 9.5 g/dL — ABNORMAL LOW (ref 12.0–15.0)
MCH: 30.4 pg (ref 26.0–34.0)
MCHC: 32 g/dL (ref 30.0–36.0)
MCV: 95.2 fL (ref 78.0–100.0)
Platelets: 244 10*3/uL (ref 150–400)
RBC: 3.12 MIL/uL — ABNORMAL LOW (ref 3.87–5.11)
RDW: 17.4 % — AB (ref 11.5–15.5)
WBC: 10.4 10*3/uL (ref 4.0–10.5)

## 2016-12-05 LAB — GLUCOSE, CAPILLARY
Glucose-Capillary: 145 mg/dL — ABNORMAL HIGH (ref 65–99)
Glucose-Capillary: 109 mg/dL — ABNORMAL HIGH (ref 65–99)

## 2016-12-05 LAB — PROTIME-INR
INR: 1.54
Prothrombin Time: 18.6 seconds — ABNORMAL HIGH (ref 11.4–15.2)

## 2016-12-05 MED ORDER — GERHARDT'S BUTT CREAM
TOPICAL_CREAM | Freq: Two times a day (BID) | CUTANEOUS | Status: DC
  Administered 2016-12-05 – 2016-12-11 (×11): via TOPICAL
  Filled 2016-12-05: qty 1

## 2016-12-05 MED ORDER — INSULIN ASPART 100 UNIT/ML ~~LOC~~ SOLN: 0.0000 [IU] | SUBCUTANEOUS | 11 refills | Status: DC

## 2016-12-05 MED ORDER — ONDANSETRON HCL 4 MG/2ML IJ SOLN: 4.0000 mg | Freq: Four times a day (QID) | INTRAMUSCULAR | Status: DC | PRN

## 2016-12-05 MED ORDER — LEVALBUTEROL HCL 0.63 MG/3ML IN NEBU
0.6300 mg | INHALATION_SOLUTION | Freq: Three times a day (TID) | RESPIRATORY_TRACT | Status: DC
Start: 2016-12-05 — End: 2016-12-12
  Administered 2016-12-05 – 2016-12-11 (×17): 0.63 mg via RESPIRATORY_TRACT
  Filled 2016-12-05 (×18): qty 3

## 2016-12-05 MED ORDER — ZOLPIDEM TARTRATE 5 MG PO TABS: 5.0000 mg | ORAL_TABLET | Freq: Every evening | ORAL | 0 refills | Status: DC | PRN

## 2016-12-05 MED ORDER — MAGIC MOUTHWASH: 5.0000 mL | Freq: Three times a day (TID) | ORAL | 0 refills | Status: DC

## 2016-12-05 MED ORDER — LEVALBUTEROL HCL 0.63 MG/3ML IN NEBU: 0.6300 mg | INHALATION_SOLUTION | Freq: Three times a day (TID) | RESPIRATORY_TRACT | 12 refills | Status: DC

## 2016-12-05 MED ORDER — LEVALBUTEROL HCL 0.63 MG/3ML IN NEBU: 0.6300 mg | INHALATION_SOLUTION | Freq: Four times a day (QID) | RESPIRATORY_TRACT | 12 refills | Status: DC | PRN

## 2016-12-05 MED ORDER — FENTANYL CITRATE (PF) 100 MCG/2ML IJ SOLN: 12.5000 ug | Freq: Four times a day (QID) | INTRAMUSCULAR | 0 refills | Status: DC | PRN

## 2016-12-05 MED ORDER — OXYCODONE HCL 5 MG PO TABS
5.0000 mg | ORAL_TABLET | ORAL | Status: DC | PRN
  Administered 2016-12-06 – 2016-12-11 (×8): 5 mg via ORAL
  Filled 2016-12-05 (×8): qty 1

## 2016-12-05 MED ORDER — OXYCODONE HCL 5 MG/5ML PO SOLN: 5.0000 mg | ORAL | 0 refills | Status: DC | PRN

## 2016-12-05 MED ORDER — SALINE SPRAY 0.65 % NA SOLN: 1.0000 | NASAL | 0 refills | Status: AC | PRN

## 2016-12-05 MED ORDER — IPRATROPIUM BROMIDE 0.02 % IN SOLN: 0.5000 mg | Freq: Three times a day (TID) | RESPIRATORY_TRACT | 12 refills | Status: DC

## 2016-12-05 MED ORDER — ENSURE ENLIVE PO LIQD: 237.0000 mL | Freq: Three times a day (TID) | ORAL | 12 refills | Status: DC

## 2016-12-05 MED ORDER — ACETAMINOPHEN 650 MG RE SUPP: 650.0000 mg | RECTAL | 0 refills | Status: DC | PRN

## 2016-12-05 MED ORDER — AMIODARONE HCL 200 MG PO TABS: 200.0000 mg | ORAL_TABLET | Freq: Two times a day (BID) | ORAL | Status: DC

## 2016-12-05 MED ORDER — BLISTEX MEDICATED EX OINT: 1.0000 "application " | TOPICAL_OINTMENT | CUTANEOUS | Status: DC | PRN

## 2016-12-05 MED ORDER — CHLORHEXIDINE GLUCONATE 0.12 % MT SOLN: 15.0000 mL | Freq: Two times a day (BID) | OROMUCOSAL | 0 refills | Status: DC

## 2016-12-05 MED ORDER — PANTOPRAZOLE SODIUM 40 MG PO PACK: 40.0000 mg | PACK | Freq: Every day | ORAL | Status: DC

## 2016-12-05 MED ORDER — ACETAMINOPHEN 160 MG/5ML PO SOLN
325.0000 mg | ORAL | 0 refills | Status: DC | PRN
Start: 2016-12-05 — End: 2016-12-12

## 2016-12-05 MED ORDER — WARFARIN SODIUM 2 MG PO TABS: 2.0000 mg | ORAL_TABLET | Freq: Every day | ORAL | Status: DC

## 2016-12-05 MED ORDER — WARFARIN SODIUM 2 MG PO TABS: 4.0000 mg | ORAL_TABLET | Freq: Once | ORAL | 0 refills | Status: DC

## 2016-12-05 MED ORDER — IPRATROPIUM BROMIDE 0.02 % IN SOLN
0.5000 mg | Freq: Three times a day (TID) | RESPIRATORY_TRACT | Status: DC
  Administered 2016-12-05 – 2016-12-11 (×17): 0.5 mg via RESPIRATORY_TRACT
  Filled 2016-12-05 (×18): qty 2.5

## 2016-12-05 MED ORDER — WARFARIN - PHARMACIST DOSING INPATIENT
Freq: Every day | Status: DC
  Administered 2016-12-08: 18:00:00

## 2016-12-05 MED ORDER — ACETAMINOPHEN 160 MG/5ML PO SOLN: 325.0000 mg | ORAL | 0 refills | Status: DC | PRN

## 2016-12-05 MED ORDER — PANTOPRAZOLE SODIUM 40 MG PO PACK
40.0000 mg | PACK | Freq: Every day | ORAL | Status: DC
  Administered 2016-12-06 – 2016-12-11 (×6): 40 mg via ORAL
  Filled 2016-12-05 (×6): qty 20

## 2016-12-05 MED ORDER — RESOURCE THICKENUP CLEAR PO POWD: 1.0000 | ORAL | Status: DC | PRN

## 2016-12-05 MED ORDER — HEPARIN (PORCINE) IN NACL 100-0.45 UNIT/ML-% IJ SOLN
1100.0000 [IU]/h | INTRAMUSCULAR | Status: DC
  Administered 2016-12-05 – 2016-12-07 (×3): 1100 [IU]/h via INTRAVENOUS
  Filled 2016-12-05 (×3): qty 250

## 2016-12-05 MED ORDER — OSMOLITE 1.5 CAL PO LIQD: 1000.0000 mL | ORAL | 0 refills | Status: DC

## 2016-12-05 MED ORDER — ONDANSETRON HCL 4 MG PO TABS
4.0000 mg | ORAL_TABLET | Freq: Four times a day (QID) | ORAL | Status: DC | PRN
  Administered 2016-12-08 – 2016-12-11 (×4): 4 mg via ORAL
  Filled 2016-12-05 (×4): qty 1

## 2016-12-05 MED ORDER — AMIODARONE HCL 200 MG PO TABS
200.0000 mg | ORAL_TABLET | Freq: Two times a day (BID) | ORAL | Status: DC
  Administered 2016-12-05 – 2016-12-12 (×14): 200 mg via ORAL
  Filled 2016-12-05 (×15): qty 1

## 2016-12-05 MED ORDER — HEPARIN (PORCINE) IN NACL 100-0.45 UNIT/ML-% IJ SOLN: 1050.0000 [IU]/h | INTRAMUSCULAR | Status: DC

## 2016-12-05 MED ORDER — WARFARIN SODIUM 2 MG PO TABS
2.0000 mg | ORAL_TABLET | Freq: Once | ORAL | Status: AC
  Administered 2016-12-05: 2 mg via ORAL
  Filled 2016-12-05: qty 1

## 2016-12-05 MED ORDER — ENSURE ENLIVE PO LIQD: 237.0000 mL | Freq: Three times a day (TID) | ORAL | Status: DC

## 2016-12-05 MED ORDER — ORAL CARE MOUTH RINSE: 15.0000 mL | Freq: Two times a day (BID) | OROMUCOSAL | 0 refills | Status: DC

## 2016-12-05 MED ORDER — HEPARIN (PORCINE) IN NACL 100-0.45 UNIT/ML-% IJ SOLN
1050.0000 [IU]/h | INTRAMUSCULAR | Status: DC
  Filled 2016-12-05: qty 250

## 2016-12-05 MED ORDER — GERHARDT'S BUTT CREAM: 1.0000 "application " | TOPICAL_CREAM | Freq: Two times a day (BID) | CUTANEOUS | Status: DC

## 2016-12-05 MED ORDER — SORBITOL 70 % SOLN
30.0000 mL | Freq: Every day | Status: DC | PRN
  Administered 2016-12-10: 30 mL via ORAL
  Filled 2016-12-05: qty 30

## 2016-12-05 MED ORDER — FREE WATER: 200.0000 mL | Freq: Four times a day (QID) | Status: DC

## 2016-12-05 NOTE — Progress Notes (Signed)
Physical Therapy Treatment Patient Details Name: [name] MRN: [id number] DOB: 1945/12/26 Today's Date: 12/05/2016    History of Present Illness 71 y.o. 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 Initially was ambulatory in the ICU and out of bed without difficulty. She later had worsening hypoxia and  Intubated for hypoxic respiratory failure 5/92  Course complicated by shock thought to be sepsis from PNA but no positive cultures, AF with RVR, bilateral LE DVT.  Patient extubated 11/14/16.    PT Comments    Pt presented sitting OOB in recliner when PT entered room. Pt continues to make good progress with mobility. Pt ambulated without AD this session, still mildly unsteady and benefited from one HHA. Pt on RA throughout with SPO2 maintaining >92%. Pt would continue to benefit from skilled physical therapy services at this time while admitted and after d/c to address her limitations in order to improve her overall safety and independence with functional mobility.   Follow Up Recommendations  CIR;Supervision for mobility/OOB     Equipment Recommendations  None recommended by PT    Recommendations for Other Services       Precautions / Restrictions Precautions Precautions: Fall Restrictions Weight Bearing Restrictions: No    Mobility  Bed Mobility               General bed mobility comments: pt sitting OOB in recliner when PT entered  Transfers Overall transfer level: Needs assistance Equipment used: None Transfers: Sit to/from Stand Sit to Stand: Min guard         General transfer comment: increased time, min guard for safety. pt performed sit-to-stand from recliner chair x2 with min guard and no AD  Ambulation/Gait Ambulation/Gait assistance: Min guard Ambulation Distance (Feet): 40 Feet Assistive  device: 1 person hand held assist Gait Pattern/deviations: Step-through pattern;Decreased stride length;Trunk flexed Gait velocity: decreased Gait velocity interpretation: Below normal speed for age/gender General Gait Details: Assist for balance and safety. Pt amb on RA with SpO2 >92%    Stairs            Wheelchair Mobility    Modified Rankin (Stroke Patients Only)       Balance Overall balance assessment: Needs assistance Sitting-balance support: No upper extremity supported;Feet supported Sitting balance-Leahy Scale: Good     Standing balance support: No upper extremity supported Standing balance-Leahy Scale: Fair                      Cognition Arousal/Alertness: Awake/alert Behavior During Therapy: WFL for tasks assessed/performed Overall Cognitive Status: Within Functional Limits for tasks assessed                      Exercises      General Comments        Pertinent Vitals/Pain Pain Assessment: No/denies pain    Home Living                      Prior Function            PT Goals (current goals can now be found in the care plan section) Acute Rehab PT Goals Patient Stated Goal: go to CIR/rehab to get stronger PT Goal Formulation: With patient Time For Goal Achievement: 12/13/16 Potential to Achieve Goals: Good Progress towards PT goals: Progressing toward goals  Frequency    Min 3X/week      PT Plan Current plan remains appropriate    Co-evaluation             End of Session Equipment Utilized During Treatment: Gait belt Activity Tolerance: Patient tolerated treatment well Patient left: in chair;with call bell/phone within reach;with family/visitor present Nurse Communication: Mobility status PT Visit Diagnosis: Other abnormalities of gait and mobility (R26.89);Unsteadiness on feet (R26.81)     Time: 1126-1140 PT Time Calculation (min) (ACUTE ONLY): 14 min  Charges:  $Gait Training: 8-22  mins                    G CodesClearnce Sorrel Shandy [place] 17-Dec-2016, 11:47 AM Sherie Don, PT, DPT [phone]

## 2016-12-05 NOTE — Progress Notes (Signed)
  Speech Language Pathology Treatment: Dysphagia  Patient Details Name: [name] MRN: [id number] DOB: 04/06/1946 Today's Date: 12/05/2016 Time: 4665-9935 SLP Time Calculation (min) (ACUTE ONLY): 20 min  Assessment / Plan / Recommendation Clinical Impression  F/u reveals what appears to be good tolerance of po diet from a pulmonary standpoint with stable temps and improving CXR. Patient independent with use of swallowing precautions during skilled observation with am meal (puree, nectar thick liquid). Overall vocal quality continuing to improve with increased intensity and only subtle hoarse vocal quality remaining. Patient with poor po intake however despite above, verbalizing decreased hunger, diet consistency,  and feeding tube as potential barriers. Discussed with both patient and PA. Recommend removal of NG tube for repeat MBS today to determine potential to advance diet and calorie count following to determine need for replacement. Patient and spouse verbalizing understanding that NG tube will need to be replaced if po intake not sufficient to meet nutritional needs. Plan for MBS today at 1200.    HPI HPI: 71 y.o. 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 Initially was ambulatory in the ICU and out of bed without difficulty. She later had worsening hypoxia and respiratory status prompting initiation of BiPAP support and PCCM consultation.  Course complicated by shock thought to be sepsis from PNA but no positive cultures, AF with RVR, bilateral LE DVT.  Intubated for hypoxic respiratory failure 1/26.        SLP Plan  MBS       Recommendations  Diet recommendations: Dysphagia 1 (puree);Nectar-thick liquid Liquids provided via: Cup;No straw Medication Administration: Crushed with puree Supervision: Patient able to self  feed;Full supervision/cueing for compensatory strategies Compensations: Slow rate;Small sips/bites;Multiple dry swallows after each bite/sip;Other (Comment) Postural Changes and/or Swallow Maneuvers: Head turn left during swallow;Seated upright 90 degrees;Upright 30-60 min after meal                Oral Care Recommendations: Oral care before and after PO Follow up Recommendations: Inpatient Rehab SLP Visit Diagnosis: Dysphagia, oropharyngeal phase (R13.12) Plan: MBS       GO             [name] [name], CCC-SLP [phone]    [name] [name] 12/05/2016, 9:08 AM

## 2016-12-05 NOTE — Progress Notes (Signed)
Rehab admission:  I spoke with Donielle with [facility] and patient has been cleared for acute inpatient rehab admission for today.  Bed available and will admit to acute inpatient rehab today.  Call me for questions.  #[phone]

## 2016-12-05 NOTE — Care Management Note (Signed)
Case Management Note  Patient Details  Name: [name] MRN: [id number] Date of Birth: [birth year]  Subjective/Objective:     Patient is for discharge to CIR today.               Action/Plan:   Expected Discharge Date:                  Expected Discharge Plan:  IP Rehab Facility  In-House Referral:  Clinical Social Work  Discharge planning Services  CM Consult  Post Acute Care Choice:  NA Choice offered to:  NA  DME Arranged:  N/A DME Agency:  NA  HH Arranged:  NA HH Agency:  NA  Status of Service:  Completed, signed off  If discussed at [employer] of Stay Meetings, dates discussed:    Additional Comments:  Zenon Mayo, RN 12/05/2016, 2:25 PM

## 2016-12-05 NOTE — Progress Notes (Signed)
Received verbal order from Hannah Saunders PA to discharge pt to [facility]. Called report to nurse. Pt will be taken to 4[facility] in wheelchair. Pt will continue on heparin gtt and remains on room air. All belongings packed and taken with pt. Hannah Pandy RN

## 2016-12-05 NOTE — Progress Notes (Signed)
Modified Barium Swallow Progress Note  Patient Details  Name: [name] MRN: [id number] Date of Birth: 1946/07/16  Today's Date: 12/05/2016  Modified Barium Swallow completed.  Full report located under Chart Review in the Imaging Section.  Brief recommendations include the following:  Clinical Impression  MBS complete. Patient with improving swallowing function. Mild dysphagia persists with oral, pharyngeal, and esophageal components. Combination of laryngeal weakness with decreased hyo-laryngeal elevation, epiglottic deflection, and UES relaxation continues to result in episodes of penetration and post swallow residuals, primarily at UES and along posterior pharynx. Both penetration episodes and residuals however prevented with use of left head turn and dry swallow, signficantly decreasing aspiration risk. Note that patient with appearance of small osteophytes along C-spine (MD not present to confirm) which now that layrngeal/pharyngeal strength is improving are suspected to be contributing to some degree in decreased passage of bolus through UES. These are chronic in nature and while patient likely compensating well prior to admission, with deconditioning, has been unable to compensate as effectively as she once was. Continue to note build up of solid bolus stasis throughout esophagus post swallow raising concern for post prandial aspiration. Liquid wash assisted to clear. Recommend advancing diet to dysphagia 3, thin liquids with continued use of strict precautions as risk of aspiration related infection continues to be a concern in thish patient with compromised (although improving) pulmonary function.  Discussed with patient and spouse.    Swallow Evaluation Recommendations   Recommended Consults: Consider esophageal assessment (in the future)   SLP Diet Recommendations: Dysphagia 3 (Mech soft) solids;Thin liquid   Liquid Administration via: Cup;No straw   Medication Administration:  Crushed with puree   Supervision: Patient able to self feed;Intermittent supervision to cue for compensatory strategies   Compensations: Slow rate;Small sips/bites;Multiple dry swallows after each bite/sip;Follow solids with liquid (LEFT HEAD TURN WITH ALL BITES AND SIPS)   Postural Changes: Seated upright at 90 degrees;Remain semi-upright after after feeds/meals (Comment)   Oral Care Recommendations: Oral care BID      Gabriel Rainwater MA, CCC-SLP [phone]   Krishana Lutze [name] 12/05/2016,3:15 PM

## 2016-12-05 NOTE — Progress Notes (Signed)
ANTICOAGULATION CONSULT NOTE - FOLLOW UP  Pharmacy Consult for:  Heparin Indication: DVT and atrial fibrillation  Allergies  Allergen Reactions  . Nickel Rash    Patient Measurements: Height: 5' 3.5" (161.3 cm) Weight: 134 lb 0.6 oz (60.8 kg) IBW/kg (Calculated) : 53.55 Heparin Dosing Weight: 64.4 kg  Vital Signs: Temp: 98.8 F (37.1 C) (02/21 0804) Temp Source: Oral (02/21 0804) BP: 159/89 (02/21 0804) Pulse Rate: 80 (02/21 0804)  Labs:  Recent Labs  12/03/16 0247 12/04/16 0235 12/05/16 0342  HGB 9.5* 9.5* 9.5*  HCT 29.9* 29.8* 29.7*  PLT 266 244 244  LABPROT 15.3* 20.3* 18.6*  INR 1.20 1.72 1.54  HEPARINUNFRC 0.59 0.59 0.66    Estimated Creatinine Clearance: 54.6 mL/min (by C-G formula based on SCr of 0.77 mg/dL).  Medications: Heparin @ 1100 units/hr  Assessment: 54 yof continues on heparin for new bilateral peroneal DVTs and afib. Negative for HIT.  Heparin level is therapeutic at 0.66 this morning on 1100 units/hr.  CBC stable; no bleeding documented. Of note, warfarin started 2/16 per MD. INR 1.54 << 1.72 after dose of 2 mg. Of noting - appears dose on 2/19 ordered by MD may have been missed which may explain INR drop. Will reduce the heparin drip slightly today to keep within range.   Goal of Therapy:  Heparin level 0.3- 0.7 units/mL Monitor platelets by anticoagulation protocol: Yes   Plan:  1. Reduce Heparin to 1050 units/hr (10.5 ml/hr) 2. Daily heparin level and CBC 3. Monitor for signs and symptoms of bleeding  4. Warfarin per MD - 2 mg ordered for this evening  Thank you for allowing pharmacy to be a part of this patient's care.  Alycia Rossetti, PharmD, BCPS Clinical Pharmacist Pager: [phone] Clinical phone for 12/05/2016 from 7a-3:30p: [phone] If after 3:30p, please call main pharmacy at: [phone] 12/05/2016 8:18 AM

## 2016-12-05 NOTE — H&P (Signed)
Physical Medicine and Rehabilitation Admission H&P    Chief complaint: Weakness  HPI: [name] is a 71 y.o. right handed  female with history of asthma, hypertension, tobacco abuse, hyperlipidemia and NSCLC followed by oncology services Dr. Earlie Server s/p recent right lobectomy/VATS 10/29/2016 who developed a persistent airleak followed closely by pulmonary services as well as cardiothoracic surgery. History taken from chart review and patient. Admitted 11/05/2016 and  underwent endobronchial valve replacement x3 on 11/05/16 for treatment per Dr. Servando Snare. Postoperative echocardiogram with ejection fraction of 56% grade 1 diastolic dysfunction. Course complicated by respiratory failure and sepsis requiring intubation as well as right sided chest tube, bilateral LE DVT's with acute DVT involving the peroneal veins on the right and left lower extremities requiring intravenous heparin and transitioned to Coumadin. She was extubated on 11/14/16. She was advanced to D3 thin diet. Acute on chronic anemia monitored. Intermittent atrial fibrillation postoperatively cardiac rate controlled currently maintained on amiodarone. CXR 2/20 reviewed, improved with right pleural effusion. Due to ongoing issues with stamina and swallowing PM&R was asked to see the patient regarding recommendation for further rehab. Physical and occupational therapy evaluations completed and ongoing. Patient was admitted for a comprehensive rehabilitation program  Review of Systems  Constitutional: Negative for chills and fever.  HENT: Negative for hearing loss and tinnitus.   Eyes: Negative for blurred vision and double vision.  Respiratory: Positive for cough and shortness of breath.   Cardiovascular: Positive for leg swelling. Negative for chest pain and palpitations.  Gastrointestinal: Positive for constipation and nausea. Negative for abdominal pain and vomiting.  Genitourinary: Negative for dysuria, flank pain and hematuria.   Musculoskeletal: Positive for joint pain and myalgias. Negative for falls.  Skin: Negative for rash.  Neurological: Positive for weakness. Negative for seizures.  All other systems reviewed and are negative.  Past Medical History:  Diagnosis Date  . Arthritis   . Asthma    asthmatic bronchitis  . COPD (chronic obstructive pulmonary disease) ([place])   . Dyspnea   . History of kidney stones   . Hyperlipidemia   . Hypertension   . Pneumonia   . PONV (postoperative nausea and vomiting)    none with her hip surgery  . Restless leg syndrome   . 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 ABDOMINAL HYSTERECTOMY    . BONE DENSIDEXAY  06/02/2013  . CARDIAC CATHETERIZATION    . COLONOSCOPY  08/05/2015  . CYST EXCISION     from back  . CYSTO    . DENTAL EXAMINATION UNDER ANESTHESIA    . DIAGNOSTIC MAMMOGRAM  07/28/2016  . EYE SURGERY Left    cataract surgery with lens implant  . LYMPH NODE DISSECTION Right 10/29/2016   Procedure: LYMPH NODE DISSECTION;  Surgeon: [name], MD;  Location: [facility];  Service: Thoracic;  Laterality: Right;  . TONSILLECTOMY    . TOTAL HIP ARTHROPLASTY  10/31/2012   Procedure: TOTAL HIP ARTHROPLASTY ANTERIOR APPROACH;  Surgeon: Mcarthur Rossetti, MD;  Location: [facility];  Service: Orthopedics;  Laterality: Right;  Right Total Hip Replacement  . VIDEO ASSISTED THORACOSCOPY (VATS)/WEDGE RESECTION Right 10/29/2016   Procedure: RIGHT  VIDEO ASSISTED THORACOSCOPY /RIGHT UPPER LOBE LUNG RESECTION and Right Thoracotomy;  Surgeon: [name], MD;  Location: [facility];  Service: Thoracic;  Laterality: Right;  [name] N/A 10/29/2016   Procedure: VIDEO BRONCHOSCOPY;  Surgeon: [name],  MD;  Location: [facility];  Service: Thoracic;  Laterality: N/A;  . VIDEO BRONCHOSCOPY WITH ENDOBRONCHIAL NAVIGATION N/A 08/29/2016   Procedure: VIDEO BRONCHOSCOPY WITH ENDOBRONCHIAL  NAVIGATION;  Surgeon: Collene Gobble, MD;  Location: [facility];  Service: Thoracic;  Laterality: N/A;  . VIDEO BRONCHOSCOPY WITH INSERTION OF INTERBRONCHIAL VALVE (IBV) N/A 11/05/2016   Procedure: VIDEO BRONCHOSCOPY WITH INSERTION OF INTERBRONCHIAL VALVE (IBV) times three;  Surgeon: [name], MD;  Location: [facility];  Service: Thoracic;  Laterality: N/A;   History reviewed. No pertinent family history of Lung CA. Social History:  reports that she quit smoking about 3 months ago. She has never used smokeless tobacco. She reports that she does not drink alcohol or use drugs. Allergies:  Allergies  Allergen Reactions  . Nickel Rash   Medications Prior to Admission  Medication Sig Dispense Refill  . simvastatin (ZOCOR) 20 MG tablet Take 20 mg by mouth every morning.    . Tiotropium Bromide-Olodaterol (STIOLTO RESPIMAT) 2.5-2.5 MCG/ACT AERS Inhale 2 puffs into the lungs daily. 1 Inhaler 0  . [DISCONTINUED] aspirin EC 81 MG tablet Take 81 mg by mouth every evening.     . [DISCONTINUED] Calcium Carbonate-Vitamin D (CALCIUM 600 + D PO) Take 1 tablet by mouth 2 (two) times daily.    . [DISCONTINUED] diltiazem (TIAZAC) 300 MG 24 hr capsule Take 300 mg by mouth every morning.    . [DISCONTINUED] diphenhydrAMINE (BENADRYL) 25 mg capsule Take 50 mg by mouth at bedtime.     . [DISCONTINUED] fluticasone (FLONASE) 50 MCG/ACT nasal spray Place 2 sprays into the nose daily as needed for allergies or rhinitis. For nasal decongestant     . [DISCONTINUED] ibuprofen (ADVIL,MOTRIN) 200 MG tablet Take 400 mg by mouth every 6 (six) hours as needed for mild pain.    . [DISCONTINUED] loratadine (CLARITIN) 10 MG tablet Take 10 mg by mouth daily.     . [DISCONTINUED] meclizine (ANTIVERT) 25 MG tablet Take 25 mg by mouth 3 (three) times daily as needed for dizziness.    . [DISCONTINUED] telmisartan (MICARDIS) 40 MG tablet Take 40 mg by mouth daily.     . [DISCONTINUED] triamterene-hydrochlorothiazide (MAXZIDE-25) 37.5-25  MG per tablet Take 1 tablet by mouth every morning.    . [DISCONTINUED] zolpidem (AMBIEN) 10 MG tablet Take 5 mg by mouth at bedtime as needed for sleep. For sleep     . albuterol (PROVENTIL HFA;VENTOLIN HFA) 108 (90 BASE) MCG/ACT inhaler Inhale 2 puffs into the lungs every 6 (six) hours as needed for wheezing or shortness of breath. For shortness of breath    . [DISCONTINUED] ALPRAZolam (XANAX) 1 MG tablet Take tablet 30 minutes prior to MRI. (Patient not taking: Reported on 10/18/2016) 1 tablet 0  . [DISCONTINUED] sodium chloride (OCEAN) 0.65 % nasal spray Place 2 sprays into the nose daily as needed for congestion. For nasal decongestant     . [DISCONTINUED] tetrahydrozoline 0.05 % ophthalmic solution Place 1 drop into both eyes daily as needed (dry eye).      Home: Home Living Family/patient expects to be discharged to:: Private residence Living Arrangements: Spouse/significant other Available Help at Discharge: Family, Available 24 hours/day Type of Home: House Home Access: Stairs to enter [employer] of Steps: 5 Entrance Stairs-Rails: Right, Left Home Layout: Multi-level, Bed/bath upstairs (Enter on second floor with bedroom on third floor) Alternate Level Stairs-Number of Steps: flight Home Equipment: Environmental consultant - 2 wheels, [employer] - single point, Industrial/product designer History: Prior Function  Level of Independence: Independent Comments: Driving  Functional Status:  Mobility: Bed Mobility Overal bed mobility: Needs Assistance Bed Mobility: Sit to Supine Rolling: Min assist Sidelying to sit: Min assist, Mod assist Supine to sit: Min assist Sit to supine: Min guard Sit to sidelying: Mod assist General bed mobility comments: minguard for safety with assist for lines and increased time with use of rail  Transfers Overall transfer level: Needs assistance Equipment used: 4-wheeled walker Transfers: Sit to/from Stand Sit to Stand: Min guard General transfer comment:  Assist for balance and safety Ambulation/Gait Ambulation/Gait assistance: Min guard Ambulation Distance (Feet): 140 Feet Assistive device: Rolling walker (2 wheeled) Gait Pattern/deviations: Step-through pattern, Decreased stride length, Trunk flexed General Gait Details: Assist for balance and safety. Pt amb on RA with SpO2 >90% when good waveform detected Gait velocity: decr Gait velocity interpretation: Below normal speed for age/gender    ADL:    Cognition: Cognition Overall Cognitive Status: Within Functional Limits for tasks assessed Orientation Level: Oriented X4 Cognition Arousal/Alertness: Awake/alert Behavior During Therapy: WFL for tasks assessed/performed Overall Cognitive Status: Within Functional Limits for tasks assessed General Comments: A&O x 4; anxious with SOB during ambulation  Physical Exam: Blood pressure (!) 143/67, pulse 85, temperature 98.9 F (37.2 C), temperature source Oral, resp. rate (!) 21, height 5' 3.5" (1.613 m), weight 60.8 kg (134 lb 0.6 oz), SpO2 97 %. Physical Exam  Vitals reviewed. Constitutional: She is oriented to person, place, and time. She appears well-developed and well-nourished.  HENT:  Head: Normocephalic and atraumatic.  Eyes: Conjunctivae and EOM are normal. Left eye exhibits no discharge.  Neck: Normal range of motion. Neck supple. No thyromegaly present.  Cardiovascular: Normal rate and regular rhythm.   Respiratory: Effort normal.  Fair inspiratory effort clear to auscultation without wheeze  GI: Soft. Bowel sounds are normal. She exhibits no distension.  Musculoskeletal: She exhibits no edema or tenderness.  Neurological: She is alert and oriented to person, place, and time.  Motor: 4/5 grossly throughout (right slightly stronger than left) Sensation diminished to light touch right hand >> bilateral feet  Skin: Skin is warm and dry.  Psychiatric: She has a normal mood and affect. Her behavior is normal.    Results for  orders placed or performed during the hospital encounter of 10/29/16 (from the past 48 hour(s))  Glucose, capillary     Status: Abnormal   Collection Time: 12/03/16  8:09 AM  Result Value Ref Range   Glucose-Capillary 138 (H) 65 - 99 mg/dL   Comment 1 Notify RN    Comment 2 Document in Chart   Glucose, capillary     Status: None   Collection Time: 12/03/16 12:21 PM  Result Value Ref Range   Glucose-Capillary 88 65 - 99 mg/dL   Comment 1 Notify RN    Comment 2 Document in Chart   Glucose, capillary     Status: Abnormal   Collection Time: 12/03/16  3:59 PM  Result Value Ref Range   Glucose-Capillary 113 (H) 65 - 99 mg/dL   Comment 1 Repeat Test    Comment 2 Document in Chart   Glucose, capillary     Status: Abnormal   Collection Time: 12/03/16  7:53 PM  Result Value Ref Range   Glucose-Capillary 110 (H) 65 - 99 mg/dL  Glucose, capillary     Status: Abnormal   Collection Time: 12/03/16 11:06 PM  Result Value Ref Range   Glucose-Capillary 147 (H) 65 - 99 mg/dL  Heparin level (unfractionated)  Status: None   Collection Time: 12/04/16  2:35 AM  Result Value Ref Range   Heparin Unfractionated 0.59 0.30 - 0.70 IU/mL    Comment:        IF HEPARIN RESULTS ARE BELOW EXPECTED VALUES, AND PATIENT DOSAGE HAS BEEN CONFIRMED, SUGGEST FOLLOW UP TESTING OF ANTITHROMBIN III LEVELS.   CBC     Status: Abnormal   Collection Time: 12/04/16  2:35 AM  Result Value Ref Range   WBC 10.5 4.0 - 10.5 K/uL   RBC 3.11 (L) 3.87 - 5.11 MIL/uL   Hemoglobin 9.5 (L) 12.0 - 15.0 g/dL   HCT 29.8 (L) 36.0 - 46.0 %   MCV 95.8 78.0 - 100.0 fL   MCH 30.5 26.0 - 34.0 pg   MCHC 31.9 30.0 - 36.0 g/dL   RDW 17.4 (H) 11.5 - 15.5 %   Platelets 244 150 - 400 K/uL  Protime-INR     Status: Abnormal   Collection Time: 12/04/16  2:35 AM  Result Value Ref Range   Prothrombin Time 20.3 (H) 11.4 - 15.2 seconds   INR 1.72   Glucose, capillary     Status: Abnormal   Collection Time: 12/04/16  4:20 AM  Result Value  Ref Range   Glucose-Capillary 113 (H) 65 - 99 mg/dL  Glucose, capillary     Status: Abnormal   Collection Time: 12/04/16  8:01 AM  Result Value Ref Range   Glucose-Capillary 104 (H) 65 - 99 mg/dL   Comment 1 Notify RN    Comment 2 Document in Chart   Glucose, capillary     Status: None   Collection Time: 12/04/16 12:40 PM  Result Value Ref Range   Glucose-Capillary 98 65 - 99 mg/dL   Comment 1 Notify RN    Comment 2 Document in Chart   Glucose, capillary     Status: None   Collection Time: 12/04/16  4:37 PM  Result Value Ref Range   Glucose-Capillary 87 65 - 99 mg/dL  Glucose, capillary     Status: Abnormal   Collection Time: 12/04/16  7:14 PM  Result Value Ref Range   Glucose-Capillary 135 (H) 65 - 99 mg/dL  Glucose, capillary     Status: Abnormal   Collection Time: 12/04/16 10:54 PM  Result Value Ref Range   Glucose-Capillary 126 (H) 65 - 99 mg/dL  Glucose, capillary     Status: Abnormal   Collection Time: 12/05/16  3:20 AM  Result Value Ref Range   Glucose-Capillary 145 (H) 65 - 99 mg/dL  Heparin level (unfractionated)     Status: None   Collection Time: 12/05/16  3:42 AM  Result Value Ref Range   Heparin Unfractionated 0.66 0.30 - 0.70 IU/mL    Comment:        IF HEPARIN RESULTS ARE BELOW EXPECTED VALUES, AND PATIENT DOSAGE HAS BEEN CONFIRMED, SUGGEST FOLLOW UP TESTING OF ANTITHROMBIN III LEVELS.   CBC     Status: Abnormal   Collection Time: 12/05/16  3:42 AM  Result Value Ref Range   WBC 10.4 4.0 - 10.5 K/uL   RBC 3.12 (L) 3.87 - 5.11 MIL/uL   Hemoglobin 9.5 (L) 12.0 - 15.0 g/dL   HCT 29.7 (L) 36.0 - 46.0 %   MCV 95.2 78.0 - 100.0 fL   MCH 30.4 26.0 - 34.0 pg   MCHC 32.0 30.0 - 36.0 g/dL   RDW 17.4 (H) 11.5 - 15.5 %   Platelets 244 150 - 400 K/uL  Protime-INR  Status: Abnormal   Collection Time: 12/05/16  3:42 AM  Result Value Ref Range   Prothrombin Time 18.6 (H) 11.4 - 15.2 seconds   INR 1.54    Dg Chest 2 View  Result Date: 12/04/2016 CLINICAL  DATA:  71 year old female with history of pleural effusions. COPD. Asthma. History of pneumonia. EXAM: CHEST  2 VIEW COMPARISON:  Chest ray 12/01/2016. FINDINGS: Postoperative changes of right upper lobectomy are noted. Bronchial occlusion devices are noted in the right mid lung. Patchy interstitial and airspace opacities are noted throughout the remaining right middle and lower lobes, most evident throughout the mid to lower lung. Elevation of the right hemidiaphragm. Small right pleural effusion, with some pleural fluid loculated in the apex of the right hemithorax. Left lung is clear. No left pleural effusion. No evidence of pulmonary edema. Heart size is normal. Mediastinal contours are grossly distorted by patient positioning and mediastinal shift to the right related to chronic right-sided volume loss, similar to prior examinations. Aortic atherosclerosis. Feeding tube extends into the stomach (tip below the lower margin of the image). Previously noted right IJ central venous catheter has been withdrawn. IMPRESSION: 1. Overall, there appears to be improved aeration in the right lung, likely related to resolving areas of atelectasis and airspace consolidation in the right middle and lower lobes. 2. Small right pleural effusion similar to the prior study. 3. Aortic atherosclerosis. 4. Support apparatus, as above. Electronically Signed   By: Vinnie Langton M.D.   On: 12/04/2016 09:07    Medical Problem List and Plan: 1.  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  DVT Prophylaxis/Anticoagulation: DVT peroneal veins of the right and left lower extremities.. Heparin/Coumadin therapy. Monitor for any bleeding episodes 3 . Pain Management: oxycodone as needed 4. Mood: provide emotional support 5. Neuropsych: This patient is  capable of making decisions on her own behalf. 6. Skin/Wound Care: routine skin checks 7. Fluids/Electrolytes/Nutrition: routine I&O with follow-up chemistries 8. Acute on chronic anemia. Follow-up CBC 9. Atrial fibrillation. Amiodarone 200 mg twice a day. Cardiac rate controlled 10. Dysphagia. Dysphagia #3 thin liquids. Follow-up speech therapy 11. Decreased nutritional storage. Calorie counts  Post Admission Physician Evaluation: 1. Preadmission assessment reviewed and changes made below. 2. Functional deficits secondary  to NSCLC/lobectomy. 3. Patient is admitted to receive collaborative, interdisciplinary care between the physiatrist, rehab nursing staff, and therapy team. 4. Patient's level of medical complexity and substantial therapy needs in context of that medical necessity cannot be provided at a lesser intensity of care such as a SNF. 5. Patient has experienced substantial functional loss from his/her baseline which was documented above under the "Functional History" and "Functional Status" headings.  Judging by the patient's diagnosis, physical exam, and functional history, the patient has potential for functional progress which will result in measurable gains while on inpatient rehab.  These gains will be of substantial and practical use upon discharge  in facilitating mobility and self-care at the household level. 6. Physiatrist will provide 24 hour management of medical needs as well as oversight of the therapy plan/treatment and provide guidance as appropriate regarding the interaction of the two. 7. The Preadmission Screening has been reviewed and patient status is unchanged unless otherwise stated above. 8. 24 hour rehab nursing will assist with safety, disease management and patient education  and help integrate therapy concepts, techniques,education, etc. 9. PT will assess and treat for/with: Lower  extremity strength, range of motion, stamina, balance, functional mobility,  safety, adaptive techniques and equipment,  coping skills, pain control, education.   Goals are: Mod I. 10. OT will assess and treat for/with: ADL's, functional mobility, safety, upper extremity strength, adaptive techniques and equipment, ego support, and community reintegration.   Goals are: Mod I. Therapy may proceed with showering this patient. 11. SLP will assess and treat for/with: swallowing.  Goals are: Ind. 12. Case Management and Social Worker will assess and treat for psychological issues and discharge planning. 74. Team conference will be held weekly to assess progress toward goals and to determine barriers to discharge. 14. Patient will receive at least 3 hours of therapy per day at least 5 days per week. 15. ELOS: 5-9 days.       16. Prognosis:  good  Delice Lesch, MD, Mellody Drown Cathlyn Parsons., PA-C 12/05/2016

## 2016-12-05 NOTE — Progress Notes (Signed)
Nutrition Consult / Follow-up  DOCUMENTATION CODES:   Not applicable  INTERVENTION:    Ensure Enlive po TID, each supplement provides 350 kcal and 20 grams of protein  NUTRITION DIAGNOSIS:   Increased nutrient needs related to acute illness (debility) as evidenced by estimated needs.  Ongoing  GOAL:   Patient will meet greater than or equal to 90% of their needs  Unmet  MONITOR:   PO intake, Labs, Skin, I & O's  REASON FOR ASSESSMENT:   Consult Calorie Count  ASSESSMENT:   71 y.o. Female with septic shock and acute hypoxic respiratory failure. Septic source is likely pneumonia. Patient now developing delirium and mild hypercarbia given recent ABG. Lactic acidosis is improving from procalcitonin remains significantly elevated.  Calorie count ordered this morning. At time of RD visit to patient's room, calorie count had not been initiated.  SLP following; diet advanced to dysphagia 3 with thin liquids today. TF has been discontinued. Plans for transfer to CIR today. Can re-order calorie count on CIR unit if desired.  RD will order PO supplements to maximize oral intake. Patient has only been consuming 10-25% of meals when on dysphagia 1 diet with nectar thick liquids.  Diet Order:  DIET DYS 3 Room service appropriate? Yes; Fluid consistency: Thin  Skin:  Wound (see comment) (skin tear to R buttocks)  Last BM:  2/21  Height:   Ht Readings from Last 1 Encounters:  11/30/16 5' 3.5" (1.613 m)    Weight:   Wt Readings from Last 1 Encounters:  12/05/16 134 lb 0.6 oz (60.8 kg)    Ideal Body Weight:  53.4 kg  BMI:  Body mass index is 23.37 kg/m.  Estimated Nutritional Needs:   Kcal:  1700-1900  Protein:  75-85 gm  Fluid:  1.7-1.9 L  EDUCATION NEEDS:   No education needs identified at this time  Molli Barrows, [place], [place], [place] Pager [phone] After Hours Pager [phone]

## 2016-12-05 NOTE — Progress Notes (Addendum)
      [address]       ,[place] [address]             [phone]       30 Days Post-Op Procedure(s) (LRB): VIDEO BRONCHOSCOPY WITH INSERTION OF INTERBRONCHIAL VALVE (IBV) times three (N/A)  Subjective: Patient hopes to get Cortrak out. She wants "real food".  Objective: Vital signs in last 24 hours: Temp:  [97.8 F (36.6 C)-98.9 F (37.2 C)] 98.8 F (37.1 C) (02/21 0804) Pulse Rate:  [80-88] 80 (02/21 0804) Cardiac Rhythm: (P) Normal sinus rhythm (02/21 0810) Resp:  [19-27] 24 (02/21 0804) BP: (143-159)/(67-89) 159/89 (02/21 0804) SpO2:  [91 %-98 %] 96 % (02/21 0804) Weight:  [134 lb 0.6 oz (60.8 kg)] 134 lb 0.6 oz (60.8 kg) (02/21 0340)     Intake/Output from previous day: 02/20 0701 - 02/21 0700 In: 1600 [P.O.:180; I.V.:420; NG/GT:1000] Out: 350 [Urine:350]   Physical Exam:  Cardiovascular: RRR Pulmonary: Diminished at bases Abdomen: Soft, non tender, bowel sounds present. Extremities: No lower extremity edema. Wounds: Clean and dry   Lab Results: CBC:  Recent Labs  12/04/16 0235 12/05/16 0342  WBC 10.5 10.4  HGB 9.5* 9.5*  HCT 29.8* 29.7*  PLT 244 244   BMET:  No results for input(s): NA, K, CL, CO2, GLUCOSE, BUN, CREATININE, CALCIUM in the last 72 hours.  PT/INR:   Recent Labs  12/05/16 0342  LABPROT 18.6*  INR 1.54   ABG:  INR: Will add last result for INR, ABG once components are confirmed Will add last 4 CBG results once components are confirmed  Assessment/Plan:  1. CV - SR. On Amiodarone 200 mg bid via tube, Heparin drip, and Coumadin (bilateral DVTs, previous a fib). INR decreased from 1.72 to 1.54 . Will stop Heparin drip once INR closer to 2. Will give 4 mg of Coumadin tonight. 2.  Pulmonary - On 2 liters of oxygen via [place]. CXR ordered for today but not taken yet.  3. GI-TFs at 50 ml/hr at night only now. On dysphagia 1 diet. As discussed with speech path, will try removal of Cortrak, obtain MBS, and calorie  count. The concern at this point is not so much aspiration as is obtaining sufficient caloric intake.  4. Anemia-H and H stable at 9.5 and 29.7 5. Will arrange follow up appointment  6. To CIR if bed available    Feeding tube out, hope can stop heparin tomorrow if inr close to 2 To rehab today I have seen and examined [name] and agree with the above assessment  and plan.  [name] MD [facility] [phone] Office [phone] 12/05/2016 1:57 PM     .

## 2016-12-05 NOTE — PMR Pre-admission (Signed)
PMR Admission Coordinator Pre-Admission Assessment  Patient: [name] is an 71 y.o., female MRN: [id number] DOB: Mar 23, 1946 Height: 5' 3.5" (161.3 cm) Weight: 60.8 kg (134 lb 0.6 oz)              Insurance Information HMO: No   PPO:       PCP:       IPA:       80/20:       OTHER:   PRIMARY:  Medicare A/B      Policy#:  [id number]      Subscriber:  [place] Name:        Phone#:       Fax#:   Pre-Cert#:        Employer: Retired Benefits:  Phone #:       Name: Checked in [place]. Date: 05/15/10     Deduct:  $1340      Out of Pocket Max:  none      Life Max: unlimited CIR: 100%      SNF: 100 days Outpatient: 80%     Co-Pay: 20% Home Health: 100%      Co-Pay: none DME: 80%     Co-Pay: 20% Providers: patient's choice  SECONDARY: [employer]      Policy#:  [id number]      Subscriber:  [place] Name:        Phone#:       Fax#:   Pre-Cert#:        Employer:  [employer] Benefits:  Phone #:  [phone]     Name:   Eff. Date: 05/15/10     Deduct:        Out of Pocket Max:        Life Max:   CIR:        SNF:   Outpatient:       Co-Pay:   Home Health:        Co-Pay:   DME:       Co-Pay:    Emergency Contact Information Contact Information    Name Relation Home Work [place] E Spouse [phone]      Current Medical History  Patient Admitting Diagnosis: Debility related to NSCLC/lobectomy and related complications. Also with critical illness neuropathy    History of Present Illness: A 71 y.o. right handed femalewith history of asthma, hypertension, tobacco abuse, hyperlipidemia and NSCLC s/p recent right lobectomy/VATS 10/29/2016 who developed a persistent airleak followed closely by pulmonary services as well as cardiothoracic surgery. Admitted 11/05/2016 and  underwent endobronchial valve replacement x3 on 11/05/16 for treatment per Dr. Servando Snare. Postoperative echocardiogram with ejection fraction of 94% grade 1 diastolic dysfunction. Course  complicated by respiratory failure and sepsis requiring intubation as well as right sided chest tube, bilateral LE DVT's with acute DVT involving the peroneal veins on the right and left lower extremities requiring intravenous heparin and transitioned to Coumadin. She was extubated on 11/14/16. Pt currently with NGT on for nutrition as she's had difficulty with stamina in swallowing/tolerating orals. She is taking puree and nectars in small amounts. She is scheduled for a MBS at 12 noon 12/05/16.  Acute on chronic anemia latest hemoglobin 9.5 and monitored. Intermittent atrial fibrillation postoperatively cardiac rate controlled currently maintained on amiodarone. Due to ongoing issues with stamina and swallowing. PM&R was asked to see the patient regarding recommendation for further rehab. Physical and occupational therapy evaluations completed and ongoing. Patient was admitted for a  comprehensive rehabilitation program   Past Medical History  Past Medical History:  Diagnosis Date  . Arthritis   . Asthma    asthmatic bronchitis  . COPD (chronic obstructive pulmonary disease) ([place])   . Dyspnea   . History of kidney stones   . Hyperlipidemia   . Hypertension   . Pneumonia   . PONV (postoperative nausea and vomiting)    none with her hip surgery  . Restless leg syndrome   . 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  Prior Rehab/Hospitalizations: Had a hip replacement in 2014.   Has the patient had major surgery during 100 days prior to admission? No  Current Medications   Current Facility-Administered Medications:  .  0.9 %  sodium chloride infusion, , Intravenous, Continuous, [name], MD, Last Rate: 10 mL/hr at 12/05/16 1000 .  oxyCODONE (ROXICODONE) 5 MG/5ML solution 5 mg, 5 mg, Per Tube, Q4H PRN, 5 mg at 12/03/16 1425 **AND** acetaminophen (TYLENOL) solution 325 mg, 325 mg, Per Tube, Q4H PRN, Nani Skillern, PA-C, 325 mg at 12/03/16 1425 .  acetaminophen (TYLENOL) suppository 650 mg, 650 mg, Rectal, Q4H PRN, [name], MD, 650 mg at 11/07/16 2112 .  amiodarone (PACERONE) tablet 200 mg, 200 mg, Per Tube, BID, Theone Murdoch Hammons, RPH, 200 mg at 12/04/16 2220 .  chlorhexidine (PERIDEX) 0.12 % solution 15 mL, 15 mL, Mouth Rinse, BID, [name], MD, 15 mL at 12/04/16 0853 .  Chlorhexidine Gluconate Cloth 2 % PADS 6 each, 6 each, Topical, Daily, [name], MD, 6 each at 12/03/16 1000 .  dextrose 5 % solution, , Intravenous, Continuous, [name], MD, Last Rate: 10 mL/hr at 11/30/16 0700 .  feeding supplement (OSMOLITE 1.5 CAL) liquid 1,000 mL, 1,000 mL, Per Tube, Continuous, [name], MD, Last Rate: 50 mL/hr at 12/04/16 1822, 1,000 mL at 12/04/16 1822 .  fentaNYL (SUBLIMAZE) injection 12.5-50 mcg, 12.5-50 mcg, Intravenous, Q6H PRN, Ivin Poot, MD, 50 mcg at 11/30/16 0025 .  free water 200 mL, 200 mL, Per Tube, Q6H, Kara Mead V, MD, 200 mL at 12/05/16 0345 .  Gerhardt's butt cream, , Topical, BID, [name], MD .  heparin ADULT infusion 100 units/mL (25000 units/271m sodium chloride 0.45%), 1,050 Units/hr, Intravenous, Continuous, ERolla Flatten [facility] Last Rate: 10.5 mL/hr at 12/05/16 0827, 1,050 Units/hr at 12/05/16 0827 .  insulin aspart (novoLOG) injection 0-15 Units, 0-15 Units, Subcutaneous, Q4H, KMarijean Heath NP, 2 Units at 12/05/16 [phone].  ipratropium (ATROVENT) nebulizer solution 0.5 mg, 0.5 mg, Nebulization, TID, EGrace Isaac MD, 0.5 mg at 12/05/16 03762.  levalbuterol (XOPENEX) nebulizer solution 0.63 mg, 0.63 mg, Nebulization, Q6H PRN, JJavier Glazier MD, 0.63 mg at 12/02/16 0500 .  levalbuterol (XOPENEX) nebulizer solution 0.63 mg, 0.63 mg, Nebulization, TID, EGrace Isaac MD, 0.63 mg at 12/05/16 0929 .  lip balm (BLISTEX) ointment, , Topical, PRN, EGrace Isaac MD .  magic mouthwash, 5 mL, Oral, TID,  PIvin Poot MD, 5 mL at 12/04/16 2221 .  MEDLINE mouth rinse, 15 mL, Mouth Rinse, q12n4p, EGrace Isaac MD, 15 mL at 12/04/16 1249 .  pantoprazole sodium (PROTONIX) 40 mg/20 mL oral suspension 40 mg, 40 mg, Per Tube, Q1200, RKara MeadV, MD, 40 mg at 12/04/16 1510 .  potassium chloride 30 mEq in sodium chloride 0.9 % 265 mL (KCL  MULTIRUN) IVPB, 30 mEq, Intravenous, Daily PRN, [name], PA-C, 30 mEq at 11/28/16 0555 .  RESOURCE THICKENUP CLEAR, , Oral, PRN, Ivin Poot, MD .  sodium chloride (OCEAN) 0.65 % nasal spray 1 spray, 1 spray, Each Nare, PRN, [name], MD, 1 spray at 11/22/16 2213 .  warfarin (COUMADIN) tablet 2 mg, 2 mg, Oral, q1800, Nani Skillern, PA-C, 2 mg at 12/04/16 1816 .  Warfarin - Physician Dosing Inpatient, , Does not apply, q1800, [name], MD, 1 each at 12/03/16 1800 .  zolpidem (AMBIEN) tablet 5 mg, 5 mg, Oral, QHS PRN, Melrose Nakayama, MD, 5 mg at 12/04/16 2220  Patients Current Diet: DIET - DYS 1 Room service appropriate? Yes; Fluid consistency: Nectar Thick  Precautions / Restrictions Precautions Precautions: Fall Precaution Comments: chest tube Restrictions Weight Bearing Restrictions: No   Has the patient had 2 or more falls or a fall with injury in the past year?No  Prior Activity Level Community (5-7x/wk): Went out in the yard daily.  Went out into community 4 X a week  Development worker, international aid / Equipment Home Assistive Devices/Equipment: Eyeglasses, Dentures (specify type), Environmental consultant (specify type), Cane (specify quad or straight), Shower chair without back, Blood pressure cuff Home Equipment: Environmental consultant - 2 wheels, Cane - single point, Shower seat  Prior Device Use: Indicate devices/aids used by the patient prior to current illness, exacerbation or injury? None  Prior Functional Level Prior Function Level of Independence: Independent Comments: Driving  Self Care: Did the patient need help bathing, dressing,  using the toilet or eating?  Independent  Indoor Mobility: Did the patient need assistance with walking from room to room (with or without device)? Independent  Stairs: Did the patient need assistance with internal or external stairs (with or without device)? Independent  Functional Cognition: Did the patient need help planning regular tasks such as shopping or remembering to take medications? Independent  Current Functional Level Cognition  Overall Cognitive Status: Within Functional Limits for tasks assessed Orientation Level: Oriented X4 General Comments: A&O x 4; anxious with SOB during ambulation    Extremity Assessment (includes Sensation/Coordination)  Upper Extremity Assessment: Generalized weakness  Lower Extremity Assessment: Generalized weakness    ADLs       Mobility  Overal bed mobility: Needs Assistance Bed Mobility: Sit to Supine Rolling: Min assist Sidelying to sit: Min assist, Mod assist Supine to sit: Min assist Sit to supine: Min guard Sit to sidelying: Mod assist General bed mobility comments: minguard for safety with assist for lines and increased time with use of rail     Transfers  Overall transfer level: Needs assistance Equipment used: 4-wheeled walker Transfers: Sit to/from Stand Sit to Stand: Min guard General transfer comment: Assist for balance and safety    Ambulation / Gait / Stairs / Wheelchair Mobility  Ambulation/Gait Ambulation/Gait assistance: Min guard Ambulation Distance (Feet): 140 Feet Assistive device: Rolling walker (2 wheeled) Gait Pattern/deviations: Step-through pattern, Decreased stride length, Trunk flexed General Gait Details: Assist for balance and safety. Pt amb on RA with SpO2 >90% when good waveform detected Gait velocity: decr Gait velocity interpretation: Below normal speed for age/gender    Posture / Balance Dynamic Sitting Balance Sitting balance - Comments: pt sat EOB x 3 min; able to shift weight and move  hips square to EOB Balance Overall balance assessment: Needs assistance Sitting-balance support: No upper extremity supported, Feet supported Sitting balance-Leahy Scale: Good Sitting balance - Comments: pt sat EOB x 3  min; able to shift weight and move hips square to EOB Standing balance support: No upper extremity supported Standing balance-Leahy Scale: Fair Standing balance comment: heavily relies upon eva walker    Special needs/care consideration BiPAP/CPAP No CPM No Continuous Drip IV Heparin drip Dialysis No      Life Vest No Oxygen No Special Bed No Trach Size No Wound Vac (area) No       Skin Has thin skin                               Bowel mgmt: Last BM 12/05/16 Bladder mgmt: WDL Diabetic mgmt No    Previous Home Environment Living Arrangements: Spouse/significant other Available Help at Discharge: Family, Available 24 hours/day Type of Home: House Home Layout: Multi-level, Bed/bath upstairs (Enter on second floor with bedroom on third floor) Alternate Level Stairs-Number of Steps: flight Home Access: Stairs to enter Entrance Stairs-Rails: Right, Left Entrance Stairs-Number of Steps: 5 Home Care Services: No  Discharge Living Setting Plans for Discharge Living Setting: Patient's home, House, Lives with (comment) (Lives with husband.) Type of Home at Discharge: House Discharge Home Layout: Multi-level Alternate Level Stairs-Number of Steps: 6 steps up to bedroom and bathroom; 10 steps down to den, bathroom and laundry area. Discharge Home Access: Stairs to enter Entrance Stairs-Number of Steps: 3 steps at front entry Does the patient have any problems obtaining your medications?: No  Social/Family/Support Systems Patient Roles: Spouse, Parent (Has a husband and a son.) Contact Information: Minha Fulco - spouse Anticipated Caregiver: Husband Anticipated Caregiver's Contact Information: [name] - husband - (h) [phone] Ability/Limitations of  Caregiver: Husband is retired and can assist. Building control surveyor Availability: 24/7 Discharge Plan Discussed with Primary Caregiver: Yes Is Caregiver In Agreement with Plan?: Yes Does Caregiver/Family have Issues with Lodging/Transportation while Pt is in Rehab?: No  Goals/Additional Needs Patient/Family Goal for Rehab: PT/OT/SLP mod I and supervision goals Expected length of stay: 7-12 days Cultural Considerations: [facility] Dietary Needs: Dys 1, nectar thick liquids Equipment Needs: TBD Pt/Family Agrees to Admission and willing to participate: Yes Program Orientation Provided & Reviewed with Pt/Caregiver Including Roles  & Responsibilities: Yes  Decrease burden of Care through IP rehab admission: N/A  Possible need for SNF placement upon discharge: No  Patient Condition: This patient's medical and functional status has changed since the consult dated: 12/01/16 in which the Rehabilitation Physician determined and documented that the patient's condition is appropriate for intensive rehabilitative care in an inpatient rehabilitation facility. See "History of Present Illness" (above) for medical update. Functional changes are: Currently requiring min guard assist to ambulate 140 feet RW. Patient's medical and functional status update has been discussed with the Rehabilitation physician and patient remains appropriate for inpatient rehabilitation. Will admit to inpatient rehab today.  Preadmission Screen Completed By:  Retta Diones, 12/05/2016 10:44 AM ______________________________________________________________________   Discussed status with Dr. Posey Pronto on 12/05/16 at 1043 and received telephone approval for admission today.  Admission Coordinator:  Retta Diones, time1044/Date02/21/18

## 2016-12-05 NOTE — Progress Notes (Signed)
Received pt. As a transfer from 4 E.Pt. Has been oriented to the unit routine and protocol.Safety plan was explained.Fall prevention plan was sign and explained.

## 2016-12-06 ENCOUNTER — Inpatient Hospital Stay (HOSPITAL_COMMUNITY): Payer: Medicare Other | Admitting: Speech Pathology

## 2016-12-06 ENCOUNTER — Inpatient Hospital Stay (HOSPITAL_COMMUNITY): Payer: Medicare Other | Admitting: Physical Therapy

## 2016-12-06 ENCOUNTER — Inpatient Hospital Stay (HOSPITAL_COMMUNITY): Payer: Medicare Other | Admitting: Occupational Therapy

## 2016-12-06 DIAGNOSIS — R5381 Other malaise: Secondary | ICD-10-CM

## 2016-12-06 LAB — COMPREHENSIVE METABOLIC PANEL
ALT: 18 U/L (ref 14–54)
AST: 16 U/L (ref 15–41)
Albumin: 2.5 g/dL — ABNORMAL LOW (ref 3.5–5.0)
Alkaline Phosphatase: 55 U/L (ref 38–126)
Anion gap: 9 (ref 5–15)
BILIRUBIN TOTAL: 0.2 mg/dL — AB (ref 0.3–1.2)
BUN: 13 mg/dL (ref 6–20)
CO2: 30 mmol/L (ref 22–32)
CREATININE: 1.01 mg/dL — AB (ref 0.44–1.00)
Calcium: 8.9 mg/dL (ref 8.9–10.3)
Chloride: 98 mmol/L — ABNORMAL LOW (ref 101–111)
GFR calc Af Amer: >60 mL/min (ref >60)
GFR, EST NON AFRICAN AMERICAN: 55 mL/min — AB (ref >60)
Glucose, Bld: 111 mg/dL — ABNORMAL HIGH (ref 65–99)
Potassium: 3.9 mmol/L (ref 3.5–5.1)
Sodium: 137 mmol/L (ref 135–145)
TOTAL PROTEIN: 5.8 g/dL — AB (ref 6.5–8.1)

## 2016-12-06 LAB — CBC WITH DIFFERENTIAL/PLATELET
BASOS ABS: 0.1 10*3/uL (ref 0.0–0.1)
Basophils Relative: 1 %
Eosinophils Absolute: 0.4 10*3/uL (ref 0.0–0.7)
Eosinophils Relative: 4 %
HCT: 30.5 % — ABNORMAL LOW (ref 36.0–46.0)
Hemoglobin: 9.8 g/dL — ABNORMAL LOW (ref 12.0–15.0)
LYMPHS ABS: 2.4 10*3/uL (ref 0.7–4.0)
LYMPHS PCT: 26 %
MCH: 30.3 pg (ref 26.0–34.0)
MCHC: 32.1 g/dL (ref 30.0–36.0)
MCV: 94.4 fL (ref 78.0–100.0)
MONO ABS: 0.9 10*3/uL (ref 0.1–1.0)
Monocytes Relative: 10 %
NEUTROS ABS: 5.4 10*3/uL (ref 1.7–7.7)
Neutrophils Relative %: 59 %
Platelets: 242 10*3/uL (ref 150–400)
RBC: 3.23 MIL/uL — AB (ref 3.87–5.11)
RDW: 17.4 % — ABNORMAL HIGH (ref 11.5–15.5)
WBC: 9.1 10*3/uL (ref 4.0–10.5)

## 2016-12-06 LAB — PROTIME-INR
INR: 1.58
Prothrombin Time: 19 seconds — ABNORMAL HIGH (ref 11.4–15.2)

## 2016-12-06 LAB — HEPARIN LEVEL (UNFRACTIONATED): HEPARIN UNFRACTIONATED: 0.63 [IU]/mL (ref 0.30–0.70)

## 2016-12-06 MED ORDER — BOOST / RESOURCE BREEZE PO LIQD
1.0000 | Freq: Three times a day (TID) | ORAL | Status: DC
  Administered 2016-12-06 – 2016-12-11 (×8): 1 via ORAL

## 2016-12-06 MED ORDER — WARFARIN SODIUM 2 MG PO TABS
4.0000 mg | ORAL_TABLET | Freq: Once | ORAL | Status: AC
  Administered 2016-12-06: 4 mg via ORAL
  Filled 2016-12-06: qty 2

## 2016-12-06 NOTE — Evaluation (Signed)
Speech Language Pathology Assessment and Plan  Patient Details  Name: [name] MRN: [id number] Date of Birth: 04-08-1946  SLP Diagnosis: Dysphagia  Rehab Potential: Good ELOS: 5-7 days     Today's Date: 12/06/2016 SLP Individual Time: 2778-2423 SLP Individual Time Calculation (min): 55 min   Problem List:  Patient Active Problem List   Diagnosis Date Noted  . 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 Debility   . Non-small cell cancer of right lung ([place])   . Deep venous thrombosis ([place])   . Acute blood loss anemia   . Anemia of chronic disease   . PAF (paroxysmal atrial fibrillation) ([place])   . Dysphagia   . Atelectasis   . Acute renal failure (ARF) ([place])   . Surgery follow-up   . Chest tube in place   . Acute respiratory failure with hypoxia ([place])   . Hypotension   . S/P lobectomy of lung 11/02/2016  . Malignant neoplasm of upper lobe of right lung ([place]) 10/29/2016  . COPD (chronic obstructive pulmonary disease) ([place]) 09/27/2016  . Lung mass 08/21/2016  . Tobacco use disorder 08/21/2016  . Shortness of breath 08/21/2016  . Degenerative arthritis of hip 10/31/2012   Past Medical History:  Past Medical History:  Diagnosis Date  . Arthritis   . Asthma    asthmatic bronchitis  . COPD (chronic obstructive pulmonary disease) ([place])   . Dyspnea   . History of kidney stones   . Hyperlipidemia   . Hypertension   . Pneumonia   . PONV (postoperative nausea and vomiting)    none with her hip surgery  . Restless leg syndrome   . 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 ABDOMINAL HYSTERECTOMY    . BONE DENSIDEXAY  06/02/2013  . CARDIAC CATHETERIZATION    . COLONOSCOPY  08/05/2015  . CYST EXCISION     from back  . CYSTO    . DENTAL EXAMINATION UNDER ANESTHESIA    . DIAGNOSTIC MAMMOGRAM  07/28/2016  . EYE SURGERY Left    cataract surgery with lens implant   . LYMPH NODE DISSECTION Right 10/29/2016   Procedure: LYMPH NODE DISSECTION;  Surgeon: [name], MD;  Location: [facility];  Service: Thoracic;  Laterality: Right;  . TONSILLECTOMY    . TOTAL HIP ARTHROPLASTY  10/31/2012   Procedure: TOTAL HIP ARTHROPLASTY ANTERIOR APPROACH;  Surgeon: Mcarthur Rossetti, MD;  Location: [facility];  Service: Orthopedics;  Laterality: Right;  Right Total Hip Replacement  . VIDEO ASSISTED THORACOSCOPY (VATS)/WEDGE RESECTION Right 10/29/2016   Procedure: RIGHT  VIDEO ASSISTED THORACOSCOPY /RIGHT UPPER LOBE LUNG RESECTION and Right Thoracotomy;  Surgeon: [name], MD;  Location: [facility];  Service: Thoracic;  Laterality: Right;  [name] N/A 10/29/2016   Procedure: VIDEO BRONCHOSCOPY;  Surgeon: [name], MD;  Location: [facility];  Service: Thoracic;  Laterality: N/A;  . VIDEO BRONCHOSCOPY WITH ENDOBRONCHIAL NAVIGATION N/A 08/29/2016   Procedure: VIDEO BRONCHOSCOPY WITH ENDOBRONCHIAL NAVIGATION;  Surgeon: Collene Gobble, MD;  Location: [facility];  Service: Thoracic;  Laterality: N/A;  . VIDEO BRONCHOSCOPY WITH INSERTION OF INTERBRONCHIAL VALVE (IBV) N/A 11/05/2016   Procedure: VIDEO BRONCHOSCOPY WITH INSERTION OF INTERBRONCHIAL VALVE (IBV) times three;  Surgeon: [name], MD;  Location: [facility];  Service: Thoracic;  Laterality: N/A;    Assessment / Plan / Recommendation Clinical Impression Patient is a 71  y.o. right handed femalewith history of asthma, hypertension, tobacco abuse, hyperlipidemia and NSCLC followed by oncology services Dr. Earlie Server s/p recent right lobectomy/VATS 10/29/2016 who developed a persistent airleak followed closely by pulmonary services as well as cardiothoracic surgery. History taken from chart review and patient. Admitted 11/05/2016 and  underwent endobronchial valve replacement x3 on 11/05/16 for treatment per Dr. Servando Snare. Postoperative echocardiogram with ejection fraction of 40% grade 1 diastolic dysfunction.  Course complicated by respiratory failure and sepsis requiring intubation as well as right sided chest tube, bilateral LE DVT's with acute DVT involving the peroneal veins on the right and left lower extremities requiring intravenous heparin and transitioned to Coumadin. She was extubated on 11/14/16. She was advanced to D3 thin diet. Acute on chronic anemia monitored. Intermittent atrial fibrillation postoperatively cardiac rate controlled currently maintained on amiodarone. CXR 2/20 reviewed, improved with right pleural effusion. Due to ongoing issues with stamina and swallowing PM&R was asked to see the patient regarding recommendation for further rehab. Physical and occupational therapy evaluations completed and ongoing. Patient was admitted for a comprehensive rehabilitation program 12/05/16.  Patient's cognitive-linguisitc function appeared [facility] for all tasks assessed. Patient demonstrates a mild hoarse vocal quality but her oral-motor strength and ROM appear WFL. Patient consumed Dys. 1 textures (with medications) with thin liquids via cup without overt s/s of aspiration with head turn to the left with Mod I. Patient declined trials of solid textures due to just completing breakfast. Recommend patient continue current diet. Patient with most recent MBS on 12/05/16 which showed pharyngeal weakness. Therefore, SLP educated patient on how to appropriately perform pharyngeal strengthening exercises with overall Min A verbal cues needed. Patient would benefit from skilled SLP intervention to maximize her swallowing function with the least restrictive diet prior to discharge.    Skilled Therapeutic Interventions          Administered a cognitive-linguistic evaluation and BSE. Please see above for details. Also provided verbal and demonstration cues to accurately complete pharyngeal strengthening exercises. Patient completed exercises with extra time and overall Min A verbal cues. Patient left upright in recliner  with all needs within reach. Continue with current plan of care.   SLP Assessment  Patient will need skilled Speech Lanaguage Pathology Services during CIR admission    Recommendations  SLP Diet Recommendations: Dysphagia 3 (Mech soft);Thin Liquid Administration via: Cup;No straw Medication Administration: Crushed with puree Supervision: Patient able to self feed;Intermittent supervision to cue for compensatory strategies Compensations: Slow rate;Small sips/bites;Multiple dry swallows after each bite/sip;Follow solids with liquid Postural Changes and/or Swallow Maneuvers: Head turn left during swallow;Seated upright 90 degrees;Upright 30-60 min after meal Oral Care Recommendations: Oral care before and after PO Patient destination: Home Follow up Recommendations: Outpatient SLP (TBD) Equipment Recommended: None recommended by SLP    SLP Frequency 3 to 5 out of 7 days   SLP Duration  SLP Intensity  SLP Treatment/Interventions 5-7 days   Minumum of 1-2 x/day, 30 to 90 minutes  Patient/family education;Therapeutic Activities;Therapeutic Exercise;Dysphagia/aspiration precaution training    Pain Pain Assessment Pain Assessment: No/denies pain Pain Score: 0-No pain  Prior Functioning Type of Home: House Available Help at Discharge: Family;Available 24 hours/day  Function:  Eating Eating   Modified Consistency Diet: Yes Eating Assist Level: Swallowing techniques: self managed           Cognition Comprehension Comprehension assist level: Follows complex conversation/direction with no assist  Expression   Expression assist level: Expresses complex ideas: With no assist  Radio producer  assist level: Interacts appropriately with others - No medications needed.  Problem Solving Problem solving assist level: Solves complex problems: Recognizes & self-corrects  Memory Memory assist level: More than reasonable amount of time   Short Term Goals: Week 1:  SLP Short Term Goal 1 (Week 1): STGs=LTGs  Refer to Care Plan for Long Term Goals  Recommendations for other services: None   Discharge Criteria: Patient will be discharged from SLP if patient refuses treatment 3 consecutive times without medical reason, if treatment goals not met, if there is a change in medical status, if patient makes no progress towards goals or if patient is discharged from hospital.  The above assessment, treatment plan, treatment alternatives and goals were discussed and mutually agreed upon: by patient and by family  Jadan Rouillard 12/06/2016, 12:11 PM

## 2016-12-06 NOTE — Progress Notes (Signed)
ANTICOAGULATION CONSULT NOTE - FOLLOW UP  Pharmacy Consult for:  Heparin Indication: DVT and atrial fibrillation  Allergies  Allergen Reactions  . Nickel Rash    Patient Measurements: Height: 5' 3.5" (161.3 cm) Weight: 128 lb (58.1 kg) IBW/kg (Calculated) : 53.55 Heparin Dosing Weight: 64.4 kg  Vital Signs: Temp: 98 F (36.7 C) (02/22 0435) Temp Source: Oral (02/22 0435) BP: 111/53 (02/22 0435) Pulse Rate: 86 (02/22 0920)  Labs:  Recent Labs  12/04/16 0235 12/05/16 0342 12/06/16 0404  HGB 9.5* 9.5* 9.8*  HCT 29.8* 29.7* 30.5*  PLT 244 244 242  LABPROT 20.3* 18.6* 19.0*  INR 1.72 1.54 1.58  HEPARINUNFRC 0.59 0.66 0.63  CREATININE  --   --  1.01*    Estimated Creatinine Clearance: 43.2 mL/min (by C-G formula based on SCr of 1.01 mg/dL (H)).  Medications: Heparin @ 1100 units/hr  Assessment: 1 yof continues on heparin for new bilateral peroneal DVTs and afib. Negative for HIT.  Heparin level is therapeutic at 0.63 today on 1100 units/hr.  CBC stable; no bleeding documented. Of note, warfarin started 2/16 per MD.   Prior to transfer to inpatient rehab INR 1.54 << 1.72 after dose of 2 mg. Of noting - appears dose on 2/19 ordered by MD may have been missed which may explain INR drop. Will reduce the heparin drip slightly today to keep within range.  Today INR =1.58 , day #7 heparin/warfarin overlap    Goal of Therapy:  Heparin level 0.3- 0.7 units/mL INR 2-3 Monitor platelets by anticoagulation protocol: Yes   Plan:  1. Continue heparin drip 1100 units/hr (11 ml/hr) 2. Warfarin '4mg'$  today x1 3. Monitor for signs and symptoms of bleeding  4. Daily heparin level, CBC and INR  Thank you for allowing pharmacy to be a part of this patient's care.  Nicole Cella, RPh Clinical Pharmacist Pager: [phone] phone 7a-3:30p: [phone] If after 3:30p, please call main pharmacy at: [phone] 12/06/2016 1:20 PM

## 2016-12-06 NOTE — Progress Notes (Signed)
Patient information reviewed and entered into eRehab system by Quyen Cutsforth, RN, CRRN, PPS Coordinator.  Information including medical coding and functional independence measure will be reviewed and updated through discharge.     Per nursing patient was given "Data Collection Information Summary for Patients in Inpatient Rehabilitation Facilities with attached "Privacy Act Statement-Health Care Records" upon admission.  

## 2016-12-06 NOTE — Progress Notes (Signed)
Initial Nutrition Assessment  DOCUMENTATION CODES:   Not applicable  INTERVENTION:  48 hour calorie count initiated.  Provide Boost Breeze po TID, each supplement provides 250 kcal and 9 grams of protein.  Provide Magic cup BID between meals, each supplement provides 290 kcal and 9 grams of protein.  Encourage adequate PO intake.   NUTRITION DIAGNOSIS:   Inadequate oral intake related to poor appetite as evidenced by per patient/family report.  GOAL:   Patient will meet greater than or equal to 90% of their needs  MONITOR:   PO intake, Supplement acceptance, Labs, Weight trends, Skin, I & O's  REASON FOR ASSESSMENT:   Consult Calorie Count  ASSESSMENT:   71 y.o. right handed  female with history of asthma, hypertension, tobacco abuse, hyperlipidemia and NSCLC s/p recent right lobectomy/VATS 10/29/2016 who developed a persistent airleak followed closely by pulmonary services as well as cardiothoracic surgery. Admitted 11/05/2016 and  underwent endobronchial valve replacement x3 on 11/05/16 for treatment per Dr. Servando Snare. Postoperative echocardiogram with ejection fraction of 92% grade 1 diastolic dysfunction. Course complicated by respiratory failure and sepsis requiring intubation as well as right sided chest tube, bilateral LE DVT's with acute DVT involving the peroneal veins on the right and left lower extremities requiring intravenous heparin and transitioned to Coumadin. She was extubated on 11/14/16. NGT out 2/21.   Pt is currently on a dysphagia 3 diet with thin liquids. Pt reports appetite has been improving however has been experiencing early satiety at meals. Meal completion has been 25-50% with 50 % intake today. Usual body weight reported to be ~130 lbs. Pt is agreeable to nutritional supplements to aid in caloric and protein needs. Pt reports favoring Magic cup and is willing to try Boost Breeze as she refused Ensure. RD to order. Pt encouraged to eat her foods at meals.  RD additionally consulted for calorie count. RN made aware of new orders. RD to follow up tomorrow for Day 1 calorie count results.   Nutrition-Focused physical exam completed. Findings are no fat depletion, mild to moderate muscle depletion, and mild edema.   Labs and medications reviewed.   Diet Order:  DIET DYS 3 Room service appropriate? Yes; Fluid consistency: Thin  Skin:  Wound (see comment) (wound on buttocks)  Last BM:  2/21  Height:   Ht Readings from Last 1 Encounters:  12/05/16 5' 3.5" (1.613 m)    Weight:   Wt Readings from Last 1 Encounters:  12/06/16 128 lb (58.1 kg)    Ideal Body Weight:  53.4 kg  BMI:  Body mass index is 22.32 kg/m.  Estimated Nutritional Needs:   Kcal:  1700-1900  Protein:  70-80 grams  Fluid:  1.7 - 1.9 L/day  EDUCATION NEEDS:   Education needs addressed  Corrin Parker, MS, RD, LDN Pager # [phone] After hours/ weekend pager # [phone]

## 2016-12-06 NOTE — Evaluation (Signed)
Occupational Therapy Assessment and Plan  Patient Details  Name: [name] MRN: [id number] Date of Birth: May 07, 1946  OT Diagnosis: muscle weakness (generalized) Rehab Potential: Rehab Potential (ACUTE ONLY): Excellent ELOS: 5-7 days   Today's Date: 12/06/2016 OT Individual Time: 0623-7628 OT Individual Time Calculation (min): 70 min     Problem List:  Patient Active Problem List   Diagnosis Date Noted  . 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 Debility   . Non-small cell cancer of right lung ([place])   . Deep venous thrombosis ([place])   . Acute blood loss anemia   . Anemia of chronic disease   . PAF (paroxysmal atrial fibrillation) ([place])   . Dysphagia   . Atelectasis   . Acute renal failure (ARF) ([place])   . Surgery follow-up   . Chest tube in place   . Acute respiratory failure with hypoxia ([place])   . Hypotension   . S/P lobectomy of lung 11/02/2016  . Malignant neoplasm of upper lobe of right lung ([place]) 10/29/2016  . COPD (chronic obstructive pulmonary disease) ([place]) 09/27/2016  . Lung mass 08/21/2016  . Tobacco use disorder 08/21/2016  . Shortness of breath 08/21/2016  . Degenerative arthritis of hip 10/31/2012    Past Medical History:  Past Medical History:  Diagnosis Date  . Arthritis   . Asthma    asthmatic bronchitis  . COPD (chronic obstructive pulmonary disease) ([place])   . Dyspnea   . History of kidney stones   . Hyperlipidemia   . Hypertension   . Pneumonia   . PONV (postoperative nausea and vomiting)    none with her hip surgery  . Restless leg syndrome   . 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 ABDOMINAL HYSTERECTOMY    . BONE DENSIDEXAY  06/02/2013  . CARDIAC CATHETERIZATION    . COLONOSCOPY  08/05/2015  . CYST EXCISION     from back  . CYSTO    . DENTAL EXAMINATION UNDER ANESTHESIA    . DIAGNOSTIC MAMMOGRAM  07/28/2016  . EYE  SURGERY Left    cataract surgery with lens implant  . LYMPH NODE DISSECTION Right 10/29/2016   Procedure: LYMPH NODE DISSECTION;  Surgeon: [name], MD;  Location: [facility];  Service: Thoracic;  Laterality: Right;  . TONSILLECTOMY    . TOTAL HIP ARTHROPLASTY  10/31/2012   Procedure: TOTAL HIP ARTHROPLASTY ANTERIOR APPROACH;  Surgeon: Mcarthur Rossetti, MD;  Location: [facility];  Service: Orthopedics;  Laterality: Right;  Right Total Hip Replacement  . VIDEO ASSISTED THORACOSCOPY (VATS)/WEDGE RESECTION Right 10/29/2016   Procedure: RIGHT  VIDEO ASSISTED THORACOSCOPY /RIGHT UPPER LOBE LUNG RESECTION and Right Thoracotomy;  Surgeon: [name], MD;  Location: [facility];  Service: Thoracic;  Laterality: Right;  [name] N/A 10/29/2016   Procedure: VIDEO BRONCHOSCOPY;  Surgeon: [name], MD;  Location: [place] [facility];  Service: Thoracic;  Laterality: N/A;  . VIDEO BRONCHOSCOPY WITH ENDOBRONCHIAL NAVIGATION N/A 08/29/2016   Procedure: VIDEO BRONCHOSCOPY WITH ENDOBRONCHIAL NAVIGATION;  Surgeon: Collene Gobble, MD;  Location: [facility];  Service: Thoracic;  Laterality: N/A;  . VIDEO BRONCHOSCOPY WITH INSERTION OF INTERBRONCHIAL VALVE (IBV) N/A 11/05/2016   Procedure: VIDEO BRONCHOSCOPY WITH INSERTION OF INTERBRONCHIAL VALVE (IBV) times three;  Surgeon: [name], MD;  Location: [facility];  Service: Thoracic;  Laterality: N/A;    Assessment & Plan Clinical Impression: Patient  is a 71 y.o. year old female with recent admission to the hospital on 11/05/2016 and  underwent endobronchial valve replacement x3 on 11/05/16 for treatment per Dr. Servando Snare. Postoperative echocardiogram with ejection fraction of 67% grade 1 diastolic dysfunction. Course complicated by respiratory failure and sepsis requiring intubation as well as right sided chest tube, bilateral LE DVT's with acute DVT involving the peroneal veins on the right and left lower extremities requiring intravenous heparin and transitioned  to Coumadin. She was extubated on 11/14/16   Patient transferred to CIR on 12/05/2016 .    Patient currently requires min with basic self-care skills secondary to muscle weakness, decreased cardiorespiratoy endurance and decreased standing balance and decreased balance strategies.  Prior to hospitalization, patient could complete ADLs with independent .  Patient will benefit from skilled intervention to decrease level of assist with basic self-care skills and increase independence with basic self-care skills prior to discharge home with care partner.  Anticipate patient will require 24 hour supervision and follow up outpatient.  OT - End of Session Activity Tolerance: Tolerates 10 - 20 min activity with multiple rests Endurance Deficit: Yes OT Assessment Rehab Potential (ACUTE ONLY): Excellent OT Patient demonstrates impairments in the following area(s): Balance;Endurance;Safety OT Basic ADL's Functional Problem(s): Grooming;Bathing;Dressing;Toileting OT Advanced ADL's Functional Problem(s): Simple Meal Preparation;Light Housekeeping OT Transfers Functional Problem(s): Tub/Shower;Toilet OT Additional Impairment(s): Fuctional Use of Upper Extremity OT Plan OT Intensity: Minimum of 1-2 x/day, 45 to 90 minutes OT Frequency: 5 out of 7 days OT Duration/Estimated Length of Stay: 5-7 days OT Treatment/Interventions: Balance/vestibular training;Community reintegration;Discharge planning;Patient/family education;Functional mobility training;Neuromuscular re-education;DME/adaptive equipment instruction;UE/LE Strength taining/ROM;Therapeutic Exercise;Therapeutic Activities;Self Care/advanced ADL retraining;UE/LE Coordination activities OT Self Feeding Anticipated Outcome(s): independent OT Basic Self-Care Anticipated Outcome(s): modified independent OT Toileting Anticipated Outcome(s): modified independent OT Bathroom Transfers Anticipated Outcome(s): modified independent OT Recommendation Patient  destination: Home Follow Up Recommendations: Outpatient OT Equipment Recommended: To be determined   Skilled Therapeutic Intervention Began working on endurance building through completion of selfcare tasks sit to stand at the sink.  Pt wanting to take shower, but unable secondary to IV heparin running at this time.  Min guard for sit to stand with LB selfcare.  She does fatigue easily after standing for 2 mins or more, requiring sitting rest breaks.  HR increasing to 104 with O2 sats at 94% on room air.  Pt stood for therapist to assist with rinsing her hair but she was able to wash it.  Noted moderate scapular winging on the right side with all shoulder movements throughout session.  Dressing sit to stand at min guard assist as well.  She completed session with ambulation from the sink back to the bedside recliner without assistive device and min assist.  Pt left with spouse at end of session.  Discussed goals of supervision to modified independence and ELOS of approximately 1 week.  Pt and spouse in agreement with plan.   OT Evaluation Precautions/Restrictions  Precautions Precautions: Fall Restrictions Weight Bearing Restrictions: No  Pain Pain Assessment Pain Assessment: No/denies pain Pain Score: 0-No pain Home Living/Prior Functioning Home Living Family/patient expects to be discharged to:: Private residence Living Arrangements: Spouse/significant other Available Help at Discharge: Family, Available 24 hours/day Type of Home: House Home Access: Stairs to enter [employer] of Steps: 5 Entrance Stairs-Rails: Right, Left Home Layout: Multi-level, Bed/bath upstairs (Enter on second floor with bedroom on third floor) Alternate Level Stairs-Number of Steps: 11 and 6 with one rail Alternate Level Stairs-Rails: Right Bathroom Shower/Tub: Walk-in shower  Bathroom Toilet: Programmer, systems: Yes  Lives With: Spouse IADL History Homemaking Responsibilities:  Yes Meal Prep Responsibility: Primary Laundry Responsibility: Primary Cleaning Responsibility: Primary Current License: Yes Mode of Transportation: Car Occupation: Retired Prior Function Level of Independence: Independent with basic ADLs, Independent with homemaking with wheelchair, Independent with gait  Able to Take Stairs?: Yes Driving: Yes Comments: Driving ADL  See Function Section of chart for details  Vision/Perception  Vision- History Baseline Vision/History: Wears glasses Wears Glasses: Reading only;At all times Patient Visual Report: Blurring of vision Vision- Assessment Vision Assessment?: Yes Ocular Range of Motion: Within Functional Limits Alignment/Gaze Preference: Within Defined Limits Tracking/Visual Pursuits: Able to track stimulus in all quads without difficulty Saccades: Within functional limits Convergence: Within functional limits Visual Fields: No apparent deficits Perception Comments: WFL  Cognition Overall Cognitive Status: Within Functional Limits for tasks assessed Arousal/Alertness: Awake/alert Orientation Level: Person;Place;Situation Person: Oriented Place: Oriented Situation: Oriented Year: 2018 Month: February Day of Week: Correct Memory: Appears intact Immediate Memory Recall: Sock;Blue;Bed Memory Recall: Sock;Bed;Blue Memory Recall Sock: Without Cue Memory Recall Blue: Without Cue Memory Recall Bed: Without Cue Awareness: Appears intact Problem Solving: Appears intact Safety/Judgment: Appears intact Sensation Sensation Light Touch: Appears Intact Stereognosis: Appears Intact Proprioception: Appears Intact Additional Comments: Pt does report numbness in the right hand as if she was "wearing a glove", but she was able to detect light touch accurately. Coordination Gross Motor Movements are Fluid and Coordinated: Yes (Noted BUE tremors during functional use) Fine Motor Movements are Fluid and Coordinated: No Heel Shin Test:  [facility] Motor  Motor Motor: Other (comment) Motor - Skilled Clinical Observations: Generalized weakness Mobility  Bed Mobility Bed Mobility: Rolling Right;Rolling Left;Supine to Sit;Sit to Supine Rolling Right: 5: Supervision Rolling Right Details: Verbal cues for precautions/safety Rolling Left: 5: Supervision Rolling Left Details: Verbal cues for precautions/safety Supine to Sit: 5: Supervision Supine to Sit Details: Verbal cues for precautions/safety Sit to Supine: 5: Supervision Sit to Supine - Details: Verbal cues for precautions/safety Transfers Transfers: Sit to Stand;Stand to Sit Sit to Stand: 4: Min guard;With armrests;From chair/3-in-1;With upper extremity assist Sit to Stand Details: Verbal cues for precautions/safety Stand to Sit: 4: Min guard;With upper extremity assist;With armrests;To chair/3-in-1  Trunk/Postural Assessment  Cervical Assessment Cervical Assessment: Within Functional Limits Thoracic Assessment Thoracic Assessment: Within Functional Limits Lumbar Assessment Lumbar Assessment: Within Functional Limits Postural Control Postural Control: Within Functional Limits  Balance Balance Balance Assessed: Yes Standardized Balance Assessment Standardized Balance Assessment: Berg Balance Test Berg Balance Test Sit to Stand: Able to stand  independently using hands Standing Unsupported: Able to stand safely 2 minutes Sitting with Back Unsupported but Feet Supported on Floor or Stool: Able to sit safely and securely 2 minutes Stand to Sit: Controls descent by using hands Transfers: Able to transfer safely, definite need of hands Standing Unsupported with Eyes Closed: Able to stand 10 seconds with supervision Standing Ubsupported with Feet Together: Able to place feet together independently and stand for 1 minute with supervision From Standing, Reach Forward with Outstretched Arm: Can reach forward >12 cm safely (5") From Standing Position, Pick up Object from  Floor: Able to pick up shoe safely and easily From Standing Position, Turn to Look Behind Over each Shoulder: Looks behind from both sides and weight shifts well Turn 360 Degrees: Able to turn 360 degrees safely one side only in 4 seconds or less Standing Unsupported, Alternately Place Feet on Step/Stool: Able to complete >2 steps/needs minimal assist Standing Unsupported, One Foot in Front:  Able to plae foot ahead of the other independently and hold 30 seconds Standing on One Leg: Tries to lift leg/unable to hold 3 seconds but remains standing independently Total Score: 42 Static Sitting Balance Static Sitting - Balance Support: Feet supported Static Sitting - Level of Assistance: 6: Modified independent (Device/Increase time) Dynamic Sitting Balance Dynamic Sitting - Balance Support: No upper extremity supported Dynamic Sitting - Level of Assistance: 5: Stand by assistance Static Standing Balance Static Standing - Balance Support: During functional activity Static Standing - Level of Assistance: 5: Stand by assistance Dynamic Standing Balance Dynamic Standing - Balance Support: No upper extremity supported Dynamic Standing - Level of Assistance: 4: Min assist Dynamic Standing - Balance Activities: Reaching for objects Extremity/Trunk Assessment RUE Assessment RUE Assessment: Exceptions to [facility] RUE Strength RUE Overall Strength Comments: Pt with moderate scapular winging noted with all shoulder movements.  She could demonstrate full AROM for flexion with strength 3+/5.  All other joints AROM WFLS with strength 4/5. LUE Assessment LUE Assessment: Exceptions to [facility] (AROM WFLS shoulder strength 3+/5, all other joints 4/5)   See Function Navigator for Current Functional Status.   Refer to Care Plan for Long Term Goals  Recommendations for other services: None    Discharge Criteria: Patient will be discharged from OT if patient refuses treatment 3 consecutive times without medical  reason, if treatment goals not met, if there is a change in medical status, if patient makes no progress towards goals or if patient is discharged from hospital.  The above assessment, treatment plan, treatment alternatives and goals were discussed and mutually agreed upon: by patient and by family  Renard Caperton OTR/L 12/06/2016, 12:49 PM

## 2016-12-06 NOTE — Progress Notes (Signed)
Hannah Staggers, MD Physician Signed Physical Medicine and Rehabilitation  Consult Note Date of Service: 12/01/2016 11:14 AM  Related encounter: Admission (Discharged) from 10/29/2016 in [facility] 4E CV SURGICAL PROGRESSIVE CARE     Expand All Collapse All   '[]'$ Hide copied text '[]'$ Hover for attribution information      Physical Medicine and Rehabilitation Consult Reason for [place] Referring Physician: Roxy Horseman   HPI: [name] is a 71 y.o. female with NSCLC s/p recent right lobectomy who developed a persistent airleak. Pt underwent endobronchial valve replacement x3 on 11/05/16 for treatment. Course complicated by respiratory failure and sepsis requiring intubation, bilateral LE DVT's. She was extubated on 11/14/16. Pt currently with NGT on for nutrition as she's had difficulty with stamina in swallowing/tolerating orals. She is taking puree and nectars in small amounts. Due to ongoing issues with stamina and swallowing. PM&R was asked to see the patient regarding recommendation for further rehab.    Review of Systems  Constitutional: Positive for weight loss. Negative for fever.  HENT: Negative for hearing loss.   Respiratory: Positive for cough and shortness of breath.   Cardiovascular: Positive for leg swelling.  Gastrointestinal: Positive for nausea. Negative for heartburn.  Genitourinary: Negative for dysuria.  Musculoskeletal: Positive for back pain.  Skin: Negative for itching.  Neurological: Positive for dizziness and tingling.  Psychiatric/Behavioral: Negative for memory loss.       Past Medical History:  Diagnosis Date  . Arthritis   . Asthma    asthmatic bronchitis  . COPD (chronic obstructive pulmonary disease) ([place])   . Dyspnea   . History of kidney stones   . Hyperlipidemia   . Hypertension   . Pneumonia   . PONV (postoperative nausea and vomiting)    none with her hip surgery  . Restless leg syndrome   . 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 ABDOMINAL HYSTERECTOMY    . BONE DENSIDEXAY  06/02/2013  . CARDIAC CATHETERIZATION    . COLONOSCOPY  08/05/2015  . CYST EXCISION     from back  . CYSTO    . DENTAL EXAMINATION UNDER ANESTHESIA    . DIAGNOSTIC MAMMOGRAM  07/28/2016  . EYE SURGERY Left    cataract surgery with lens implant  . LYMPH NODE DISSECTION Right 10/29/2016   Procedure: LYMPH NODE DISSECTION;  Surgeon: [name], MD;  Location: [facility];  Service: Thoracic;  Laterality: Right;  . TONSILLECTOMY    . TOTAL HIP ARTHROPLASTY  10/31/2012   Procedure: TOTAL HIP ARTHROPLASTY ANTERIOR APPROACH;  Surgeon: Mcarthur Rossetti, MD;  [facility];  Service: Orthopedics;  Laterality: Right;  Right Total Hip Replacement  . VIDEO ASSISTED THORACOSCOPY (VATS)/WEDGE RESECTION Right 10/29/2016   Procedure: RIGHT  VIDEO ASSISTED THORACOSCOPY /RIGHT UPPER LOBE LUNG RESECTION and Right Thoracotomy;  Surgeon: [name], MD;  Location: [facility];  Service: Thoracic;  Laterality: Right;  [name] N/A 10/29/2016   Procedure: VIDEO BRONCHOSCOPY;  Surgeon: [name], MD;  Location: [facility];  Service: Thoracic;  Laterality: N/A;  . VIDEO BRONCHOSCOPY WITH ENDOBRONCHIAL NAVIGATION N/A 08/29/2016   Procedure: VIDEO BRONCHOSCOPY WITH ENDOBRONCHIAL NAVIGATION;  Surgeon: Collene Gobble, MD;  Location: [facility];  Service: Thoracic;  Laterality: N/A;  . VIDEO BRONCHOSCOPY WITH INSERTION OF INTERBRONCHIAL VALVE (IBV) N/A 11/05/2016   Procedure: VIDEO BRONCHOSCOPY WITH INSERTION OF INTERBRONCHIAL VALVE (IBV) times three;  Surgeon: [name], MD;  Location: [facility];  Service: Thoracic;  Laterality: N/A;   History reviewed. No pertinent family history. Social History:  reports that she quit smoking about 3 months ago. She smoked 0.00 packs per day. She has never used smokeless tobacco. She  reports that she does not drink alcohol or use drugs. Allergies:      Allergies  Allergen Reactions  . Nickel Rash         Medications Prior to Admission  Medication Sig Dispense Refill  . simvastatin (ZOCOR) 20 MG tablet Take 20 mg by mouth every morning.    . Tiotropium Bromide-Olodaterol (STIOLTO RESPIMAT) 2.5-2.5 MCG/ACT AERS Inhale 2 puffs into the lungs daily. 1 Inhaler 0  . [DISCONTINUED] aspirin EC 81 MG tablet Take 81 mg by mouth every evening.     . [DISCONTINUED] Calcium Carbonate-Vitamin D (CALCIUM 600 + D PO) Take 1 tablet by mouth 2 (two) times daily.    . [DISCONTINUED] diltiazem (TIAZAC) 300 MG 24 hr capsule Take 300 mg by mouth every morning.    . [DISCONTINUED] diphenhydrAMINE (BENADRYL) 25 mg capsule Take 50 mg by mouth at bedtime.     . [DISCONTINUED] fluticasone (FLONASE) 50 MCG/ACT nasal spray Place 2 sprays into the nose daily as needed for allergies or rhinitis. For nasal decongestant     . [DISCONTINUED] ibuprofen (ADVIL,MOTRIN) 200 MG tablet Take 400 mg by mouth every 6 (six) hours as needed for mild pain.    . [DISCONTINUED] loratadine (CLARITIN) 10 MG tablet Take 10 mg by mouth daily.     . [DISCONTINUED] meclizine (ANTIVERT) 25 MG tablet Take 25 mg by mouth 3 (three) times daily as needed for dizziness.    . [DISCONTINUED] telmisartan (MICARDIS) 40 MG tablet Take 40 mg by mouth daily.     . [DISCONTINUED] triamterene-hydrochlorothiazide (MAXZIDE-25) 37.5-25 MG per tablet Take 1 tablet by mouth every morning.    . [DISCONTINUED] zolpidem (AMBIEN) 10 MG tablet Take 5 mg by mouth at bedtime as needed for sleep. For sleep     . albuterol (PROVENTIL HFA;VENTOLIN HFA) 108 (90 BASE) MCG/ACT inhaler Inhale 2 puffs into the lungs every 6 (six) hours as needed for wheezing or shortness of breath. For shortness of breath    . [DISCONTINUED] ALPRAZolam (XANAX) 1 MG tablet Take tablet 30 minutes prior to MRI. (Patient not taking: Reported on  10/18/2016) 1 tablet 0  . [DISCONTINUED] sodium chloride (OCEAN) 0.65 % nasal spray Place 2 sprays into the nose daily as needed for congestion. For nasal decongestant     . [DISCONTINUED] tetrahydrozoline 0.05 % ophthalmic solution Place 1 drop into both eyes daily as needed (dry eye).      Home: Home Living Family/patient expects to be discharged to:: Private residence Living Arrangements: Spouse/significant other Available Help at Discharge: Family, Available 24 hours/day Type of Home: House Home Access: Stairs to enter [employer] of Steps: 5 Entrance Stairs-Rails: Right, Left Home Layout: Multi-level, Bed/bath upstairs (Enter on second floor with bedroom on third floor) Alternate Level Stairs-Number of Steps: flight Home Equipment: Environmental consultant - 2 wheels, [employer] - single point, Careers adviser History: Prior Function Level of Independence: Independent Comments: Driving Functional Status:  Mobility: Bed Mobility Overal bed mobility: Needs Assistance Bed Mobility: Sit to Supine Rolling: Min assist Sidelying to sit: Min assist, Mod assist Supine to sit: Min assist Sit to supine: Min guard Sit to sidelying: Mod assist General bed mobility comments: minguard for safety with assist for lines  and increased time with use of rail  Transfers Overall transfer level: Needs assistance Equipment used:  (eva walker) Transfers: Sit to/from Stand Sit to Stand: Min guard General transfer comment: cues for hand placement from chair and toilet Ambulation/Gait Ambulation/Gait assistance: Min guard Ambulation Distance (Feet): 150 Feet Assistive device: Rolling walker (2 wheeled) Gait Pattern/deviations: Step-through pattern, Decreased stride length, Trunk flexed General Gait Details: cues for posture and breathing techhnique Gait velocity: decreased Gait velocity interpretation: Below normal speed for age/gender  ADL:  Cognition: Cognition Overall Cognitive Status:  Within Functional Limits for tasks assessed Orientation Level: Oriented X4 Cognition Arousal/Alertness: Awake/alert Behavior During Therapy: WFL for tasks assessed/performed Overall Cognitive Status: Within Functional Limits for tasks assessed General Comments: A&O x 4; anxious with SOB during ambulation  Blood pressure (!) 148/78, pulse 78, temperature 98.9 F (37.2 C), temperature source Oral, resp. rate (!) 26, height 5' 3.5" (1.613 m), weight 63.8 kg (140 lb 10.5 oz), SpO2 96 %. Physical Exam  Constitutional: She is oriented to person, place, and time. No distress.  HENT:  Mouth/Throat: Oropharynx is clear and moist.  NGT in place  Eyes: Pupils are equal, round, and reactive to light.  Neck: No JVD present. No thyromegaly present.  Cardiovascular: Normal rate and regular rhythm.   Respiratory: She has wheezes.  Mild sob with speech  GI: She exhibits no distension. There is no tenderness.  Neurological: She is alert and oriented to person, place, and time.  UE motor 4/5 proximal to distal. LE: 3/5 HF, 3+ KE and 4-/5 ADF/PF. Decreased to LT in stocking glove distribution over bilateral feet, to a lesser extent right>left hands.  Skin: She is diaphoretic.  Psychiatric: She has a normal mood and affect. Her behavior is normal. Judgment and thought content normal.    Lab Results Last 24 Hours       Results for orders placed or performed during the hospital encounter of 10/29/16 (from the past 24 hour(s))  Glucose, capillary     Status: Abnormal   Collection Time: 11/30/16 11:58 AM  Result Value Ref Range   Glucose-Capillary 102 (H) 65 - 99 mg/dL   Comment 1 Capillary Specimen    Comment 2 Notify RN   Glucose, capillary     Status: Abnormal   Collection Time: 11/30/16  4:12 PM  Result Value Ref Range   Glucose-Capillary 133 (H) 65 - 99 mg/dL   Comment 1 Capillary Specimen    Comment 2 Notify RN   Glucose, capillary     Status: Abnormal   Collection Time:  11/30/16  7:29 PM  Result Value Ref Range   Glucose-Capillary 108 (H) 65 - 99 mg/dL   Comment 1 Capillary Specimen    Comment 2 Notify RN   Glucose, capillary     Status: Abnormal   Collection Time: 11/30/16 11:55 PM  Result Value Ref Range   Glucose-Capillary 118 (H) 65 - 99 mg/dL   Comment 1 Notify RN   Glucose, capillary     Status: Abnormal   Collection Time: 12/01/16  3:38 AM  Result Value Ref Range   Glucose-Capillary 154 (H) 65 - 99 mg/dL   Comment 1 Notify RN   Heparin level (unfractionated)     Status: None   Collection Time: 12/01/16  5:00 AM  Result Value Ref Range   Heparin Unfractionated 0.53 0.30 - 0.70 IU/mL  Basic metabolic panel     Status: Abnormal   Collection Time: 12/01/16  5:00 AM  Result Value Ref  Range   Sodium 139 135 - 145 mmol/L   Potassium 3.9 3.5 - 5.1 mmol/L   Chloride 100 (L) 101 - 111 mmol/L   CO2 32 22 - 32 mmol/L   Glucose, Bld 135 (H) 65 - 99 mg/dL   BUN 14 6 - 20 mg/dL   Creatinine, Ser 0.77 0.44 - 1.00 mg/dL   Calcium 8.7 (L) 8.9 - 10.3 mg/dL   GFR calc non Af Amer >60 >60 mL/min   GFR calc Af Amer >60 >60 mL/min   Anion gap 7 5 - 15  CBC     Status: Abnormal   Collection Time: 12/01/16  5:00 AM  Result Value Ref Range   WBC 8.8 4.0 - 10.5 K/uL   RBC 2.96 (L) 3.87 - 5.11 MIL/uL   Hemoglobin 9.0 (L) 12.0 - 15.0 g/dL   HCT 28.6 (L) 36.0 - 46.0 %   MCV 96.6 78.0 - 100.0 fL   MCH 30.4 26.0 - 34.0 pg   MCHC 31.5 30.0 - 36.0 g/dL   RDW 17.7 (H) 11.5 - 15.5 %   Platelets 273 150 - 400 K/uL  Protime-INR     Status: None   Collection Time: 12/01/16  5:00 AM  Result Value Ref Range   Prothrombin Time 13.7 11.4 - 15.2 seconds   INR 1.05   Glucose, capillary     Status: Abnormal   Collection Time: 12/01/16  8:21 AM  Result Value Ref Range   Glucose-Capillary 119 (H) 65 - 99 mg/dL      Imaging Results (Last 48 hours)  Dg Chest Port 1 View  Result Date: 12/01/2016 CLINICAL DATA:   Postoperative change with atelectasis EXAM: PORTABLE CHEST 1 VIEW COMPARISON:  November 30, 2016 FINDINGS: There is consolidation and volume loss on the right, stable. Asymmetric pulmonary edema on the right noted. Left lung is hyperexpanded and clear. Heart size and pulmonary vascularity are within normal limits. There is an apparent small pleural effusion on the right. Central catheter tip is in the superior vena cava. Feeding tube tip is below the diaphragm. There is aortic atherosclerosis. No demonstrable adenopathy. Bony structures appear intact. IMPRESSION: Persistent postoperative change on the right with volume loss and asymmetric edema. There is an apparent small pleural effusion on the right. Left lung clear. Stable cardiac silhouette. There is aortic atherosclerosis. Electronically Signed   By: Lowella Grip III M.D.   On: 12/01/2016 07:49   Dg Chest Port 1 View  Result Date: 11/30/2016 CLINICAL DATA:  Chest tube removal.  Shortness of breath. EXAM: PORTABLE CHEST 1 VIEW COMPARISON:  11/29/2016 FINDINGS: Right jugular catheter terminates over the SVC, unchanged. Enteric tube courses into the left upper abdomen with tip not imaged. Postsurgical changes are again seen with right hemithorax volume loss and rightward mediastinal shift. No definite pneumothorax is identified. Parenchymal opacities in the right lung are greatest in the base and have slightly increased. The left lung remains clear. Apical pleural thickening or small volume fluid is stable to slightly decreased. IMPRESSION: 1. Slightly increased right lung volume loss. 2. No pneumothorax. Electronically Signed   By: Logan Bores M.D.   On: 11/30/2016 07:48   Dg Chest Port 1 View  Result Date: 11/29/2016 CLINICAL DATA:  71 year old female status post VATS and wedge resection on the right in January with subsequent bronchoscopy and interbronchial valve placement. Chest tube removal. Initial encounter. EXAM: PORTABLE CHEST 1 VIEW  COMPARISON:  0544 hours today and earlier. FINDINGS: Portable AP semi upright view  at at 1302 hours. The remaining right chest tube has been removed. No pneumothorax is identified. There is some apical pleural fluid which might in part be the sequelae of wedge resection. Right lung base ventilation is also improved. Less rightward shift of the mediastinum. Stable right IJ central line. Calcified aortic atherosclerosis. Allowing for portable technique the left lung remains clear. Feeding tube remains in place and courses to the abdomen, tip not included. IMPRESSION: 1. No pneumothorax identified following right chest tube removal. 2. Improved right lung base ventilation. 3. No new cardiopulmonary abnormality. Electronically Signed   By: Genevie Ann M.D.   On: 11/29/2016 13:21     Assessment/Plan: Diagnosis: Debility related to NSCLC/lobectomy and related complications. Also with critical illness neuropathy 1. Does the need for close, 24 hr/day medical supervision in concert with the patient's rehab needs make it unreasonable for this patient to be served in a less intensive setting? Yes 2. Co-Morbidities requiring supervision/potential complications: dysphagia/nutrition, pulmonary and oxygen management. Oncological considerations, pain control 3. Due to bladder management, bowel management, safety, skin/wound care, disease management, medication administration, pain management and patient education, does the patient require 24 hr/day rehab nursing? Yes 4. Does the patient require coordinated care of a physician, rehab nurse, PT (1-2 hrs/day, 5 days/week), OT (1-2 hrs/day, 5 days/week) and SLP (1-2 hrs/day, 5 days/week) to address physical and functional deficits in the context of the above medical diagnosis(es)? Yes Addressing deficits in the following areas: balance, endurance, locomotion, strength, transferring, bowel/bladder control, bathing, dressing, feeding, grooming, toileting, swallowing and  psychosocial support 5. Can the patient actively participate in an intensive therapy program of at least 3 hrs of therapy per day at least 5 days per week? Yes 6. The potential for patient to make measurable gains while on inpatient rehab is excellent 7. Anticipated functional outcomes upon discharge from inpatient rehab are modified independent and supervision  with PT, modified independent and supervision with OT, modified independent and supervision with SLP. 8. Estimated rehab length of stay to reach the above functional goals is: 7-12 days 9. Does the patient have adequate social supports and living environment to accommodate these discharge functional goals? Yes 10. Anticipated D/C setting: Home 11. Anticipated post D/C treatments: [place] therapy 12. Overall Rehab/Functional Prognosis: good  RECOMMENDATIONS: This patient's condition is appropriate for continued rehabilitative care in the following setting: CIR when medically appropriate. Patient has agreed to participate in recommended program. Yes Note that insurance prior authorization may be required for reimbursement for recommended care.  Comment: Rehab Admissions Coordinator to follow up.  Thanks,  Hannah Staggers, MD, Mellody Drown    Hannah Staggers, MD 12/01/2016

## 2016-12-06 NOTE — Progress Notes (Signed)
Social Work Assessment and Plan Social Work Assessment and Plan  Patient Details  Name: [name] MRN: [id number] Date of Birth: 12-14-1945  Today's Date: 12/06/2016  Problem List:  Patient Active Problem List   Diagnosis Date Noted  . 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 Debility   . Non-small cell cancer of right lung ([place])   . Deep venous thrombosis ([place])   . Acute blood loss anemia   . Anemia of chronic disease   . PAF (paroxysmal atrial fibrillation) ([place])   . Dysphagia   . Atelectasis   . Acute renal failure (ARF) ([place])   . Surgery follow-up   . Chest tube in place   . Acute respiratory failure with hypoxia ([place])   . Hypotension   . S/P lobectomy of lung 11/02/2016  . Malignant neoplasm of upper lobe of right lung ([place]) 10/29/2016  . COPD (chronic obstructive pulmonary disease) ([place]) 09/27/2016  . Lung mass 08/21/2016  . Tobacco use disorder 08/21/2016  . Shortness of breath 08/21/2016  . Degenerative arthritis of hip 10/31/2012   Past Medical History:  Past Medical History:  Diagnosis Date  . Arthritis   . Asthma    asthmatic bronchitis  . COPD (chronic obstructive pulmonary disease) ([place])   . Dyspnea   . History of kidney stones   . Hyperlipidemia   . Hypertension   . Pneumonia   . PONV (postoperative nausea and vomiting)    none with her hip surgery  . Restless leg syndrome   . Stage I squamous cell carcinoma of right lung ([place]) 09/11/2016   Past Surgical History:  Past Surgical History:  Procedure Laterality Date  . ABDOMINAL HYSTERECTOMY    . BONE DENSIDEXAY  06/02/2013  . CARDIAC CATHETERIZATION    . COLONOSCOPY  08/05/2015  . CYST EXCISION     from back  . CYSTO    . DENTAL EXAMINATION UNDER ANESTHESIA    . DIAGNOSTIC MAMMOGRAM  07/28/2016  . EYE SURGERY Left    cataract surgery with lens implant  . LYMPH NODE DISSECTION Right 10/29/2016   Procedure: LYMPH NODE DISSECTION;  Surgeon: [name], MD;  Location: [facility];   Service: Thoracic;  Laterality: Right;  . TONSILLECTOMY    . TOTAL HIP ARTHROPLASTY  10/31/2012   Procedure: TOTAL HIP ARTHROPLASTY ANTERIOR APPROACH;  Surgeon: Mcarthur Rossetti, MD;  [facility];  Service: Orthopedics;  Laterality: Right;  Right Total Hip Replacement  . VIDEO ASSISTED THORACOSCOPY (VATS)/WEDGE RESECTION Right 10/29/2016   Procedure: RIGHT  VIDEO ASSISTED THORACOSCOPY /RIGHT UPPER LOBE LUNG RESECTION and Right Thoracotomy;  Surgeon: [name], MD;  Location: [facility];  Service: Thoracic;  Laterality: Right;  [name] N/A 10/29/2016   Procedure: VIDEO BRONCHOSCOPY;  Surgeon: [name], MD;  Location: [facility];  Service: Thoracic;  Laterality: N/A;  . VIDEO BRONCHOSCOPY WITH ENDOBRONCHIAL NAVIGATION N/A 08/29/2016   Procedure: VIDEO BRONCHOSCOPY WITH ENDOBRONCHIAL NAVIGATION;  Surgeon: Collene Gobble, MD;  [facility];  Service: Thoracic;  Laterality: N/A;  . VIDEO BRONCHOSCOPY WITH INSERTION OF INTERBRONCHIAL VALVE (IBV) N/A 11/05/2016   Procedure: VIDEO BRONCHOSCOPY WITH INSERTION OF INTERBRONCHIAL VALVE (IBV) times three;  Surgeon: [name], MD;  Location: [facility];  Service: Thoracic;  Laterality: N/A;   Social History:  reports that she quit smoking about 3 months ago. She smoked 0.00 packs per day. She has never used smokeless tobacco. She reports that she does not drink alcohol or use  drugs.  Family / Support Systems Marital Status: Married Patient Roles: Spouse, Parent Spouse/Significant Other: [name] [phone]  [phone]  Children: Son in [place] was here but has gone back Other Supports: Friends and [employer] members Anticipated Caregiver: Husband Ability/Limitations of Caregiver: Husband is retired and can assist pt Caregiver Availability: 24/7 Family Dynamics: Close knit with son and friends/church members.  Husband is here and attending therapies with pt to see how well she is doing. Pt is motivated and will try to do  whatever she needs too to get better.   Social History Preferred language: English Religion: Methodist Cultural Background: No issues Education: [employer] Read: Yes Write: Yes Employment Status: Retired Freight forwarder Issues: No issues Guardian/Conservator: none-according to MD pt is capable of making her own decisions while here. Husband plans to be here daily and involved with her care   Abuse/Neglect Physical Abuse: Denies Verbal Abuse: Denies Sexual Abuse: Denies Exploitation of patient/patient's resources: Denies Self-Neglect: Denies  Emotional Status Pt's affect, behavior adn adjustment status: Pt is motivated and pleased with how well she is doing. She is somewhat surprised and glad. She is willing to do whatever the therapy team says to get well and get home. husband staters: " She has always been this way." Recent Psychosocial Issues: lung cancer diagnosis 08/2016, surgery in Jan 2018 and now this. Feels like she is on the right track. Reports MD feel he got all of the cancer and now only a few rad tx to make sure. Pyschiatric History: No issues deferred depression screen due to doing well and coping appropriately-open and verbalizes her concerns. Substance Abuse History: Quit tobacco three months ago, need to get husband to quit also. No other issues  Patient / Family Perceptions, Expectations & Goals Pt/Family understanding of illness & functional limitations: Pt and husband can explain her condition and surgerical procedures and see MD daily. Her surgeon came by today and both feel better about what he had to say. Both happy doing well and will be home soon. Premorbid pt/family roles/activities: Wife, Mother, retiree, church member, home owner, etc Anticipated changes in roles/activities/participation: resume Pt/family expectations/goals: Pt states: " I want to be as high level as possible before I go home." Husband states: " She is doing well and probably  will expceed your expectations here."  [employer]: Other (Comment) (since surgery) Premorbid Home Care/DME Agencies: Other (Comment) (has dme from past admissions) Transportation available at discharge: Husband Resource referrals recommended: Support group (specify)  Discharge Planning Living Arrangements: Spouse/significant other Support Systems: Spouse/significant other, Children, Friends/neighbors, Social worker community Type of Residence: Private residence Insurance Resources: [employer], Multimedia programmer (specify) ([place]) Museum/gallery curator Resources: [place], Family Support Financial Screen Referred: No Living Expenses: Own Money Management: Spouse, Patient Does the patient have any problems obtaining your medications?: No Home Management: Pt and husband, husband has been doing since pt has been ill Patient/Family Preliminary Plans: Return home with husband who can provide 24 hr care if needed. Pt is high level and will not be here long, due to progress. Husband is here today and observing in therapies. Awaiting team's evaluations and will work on discharge needs. Social Work Anticipated Follow Up Needs: HH/OP, Support Group  Clinical Impression Very pleasant couple who are motivated and willing to do whatever pt needs done. Pt is moving well and now her diet was upgraded with strategies. Pt seems to be coping appropriately and open regarding her concerns and questions. Husband is willing to assist her with  care. Will await team's evaluations and work on discharge plans.  Elease Hashimoto 12/06/2016, 2:08 PM

## 2016-12-06 NOTE — Progress Notes (Signed)
Retta Diones, RN Rehab Admission Coordinator Signed Physical Medicine and Rehabilitation  PMR Pre-admission Date of Service: 12/05/2016 10:22 AM  Related encounter: Admission (Discharged) from 10/29/2016 in [facility] 4E CV SURGICAL PROGRESSIVE CARE       '[]'$ Hide copied text PMR Admission Coordinator Pre-Admission Assessment  Patient: [name] is an 71 y.o., female MRN: [id number] DOB: Sep 13, 1946 Height: 5' 3.5" (161.3 cm) Weight: 60.8 kg (134 lb 0.6 oz)                                                                                                                                                  Insurance Information HMO: No   PPO:       PCP:       IPA:       80/20:       OTHER:   PRIMARY:  Medicare A/B      Policy#:  [id number]      Subscriber:  [employer] Name:        Phone#:       Fax#:   Pre-Cert#:        Employer: Retired Benefits:  Phone #:       Name: Checked in [place]. Date: 05/15/10     Deduct:  $1340      Out of Pocket Max:  none      Life Max: unlimited CIR: 100%      SNF: 100 days Outpatient: 80%     Co-Pay: 20% Home Health: 100%      Co-Pay: none DME: 80%     Co-Pay: 20% Providers: patient's choice  SECONDARY: [employer]      Policy#:  [id number]      Subscriber:  [place] Name:        Phone#:       Fax#:   Pre-Cert#:        Employer:  [employer] Benefits:  Phone #:  [phone]     Name:   Eff. Date: 05/15/10     Deduct:        Out of Pocket Max:        Life Max:   CIR:        SNF:   Outpatient:       Co-Pay:   Home Health:        Co-Pay:   DME:       Co-Pay:    Emergency Contact Information        Contact Information    Name Relation Home Work [place] Spouse [phone]      Current Medical History  Patient Admitting Diagnosis: Debility related to NSCLC/lobectomy and related complications. Also with critical illness neuropathy    History of Present Illness: A 71 y.o.right handed femalewithhistory of  asthma, hypertension, tobacco abuse, hyperlipidemia andNSCLC s/p  recent right lobectomy/VATS01/15/2018who developed a persistent airleakfollowed closely by pulmonary services as well as cardiothoracic surgery.Admitted 11/05/2016 andunderwent endobronchial valve replacement x3 on 11/05/16 for treatmentper Dr. Servando Snare.Postoperative echocardiogram with ejection fraction of 83% grade 1 diastolic dysfunction.Course complicated by respiratory failure and sepsis requiring intubationas well as right sided chest tube, bilateral LE DVT'swith acute DVT involving the peroneal veins on the right and left lower extremities requiring intravenous heparin and transitioned to Coumadin. She was extubated on 11/14/16. Pt currently with NGT on for nutrition as she's had difficulty with stamina in swallowing/tolerating orals. She is taking puree and nectars in small amounts.She is scheduled for a MBS at 12 noon 12/05/16.  Acute on chronic anemia latest hemoglobin 9.5 and monitored. Intermittent atrial fibrillation postoperatively cardiac rate controlled currently maintained on amiodarone.Due to ongoing issues with stamina and swallowing. PM&R was asked to see the patient regarding recommendation for further rehab. Physical and occupational therapy evaluations completed and ongoing. Patient was admitted for a comprehensive rehabilitation program   Past Medical History      Past Medical History:  Diagnosis Date  . Arthritis   . Asthma    asthmatic bronchitis  . COPD (chronic obstructive pulmonary disease) ([place])   . Dyspnea   . History of kidney stones   . Hyperlipidemia   . Hypertension   . Pneumonia   . PONV (postoperative nausea and vomiting)    none with her hip surgery  . Restless leg syndrome   . 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  Prior Rehab/Hospitalizations: Had a hip replacement in 2014.   Has the  patient had major surgery during 100 days prior to admission? No  Current Medications   Current Facility-Administered Medications:  .  0.9 %  sodium chloride infusion, , Intravenous, Continuous, [name], MD, Last Rate: 10 mL/hr at 12/05/16 1000 .  oxyCODONE (ROXICODONE) 5 MG/5ML solution 5 mg, 5 mg, Per Tube, Q4H PRN, 5 mg at 12/03/16 1425 **AND** acetaminophen (TYLENOL) solution 325 mg, 325 mg, Per Tube, Q4H PRN, Nani Skillern, PA-C, 325 mg at 12/03/16 1425 .  acetaminophen (TYLENOL) suppository 650 mg, 650 mg, Rectal, Q4H PRN, [name], MD, 650 mg at 11/07/16 2112 .  amiodarone (PACERONE) tablet 200 mg, 200 mg, Per Tube, BID, Theone Murdoch Hammons, RPH, 200 mg at 12/04/16 2220 .  chlorhexidine (PERIDEX) 0.12 % solution 15 mL, 15 mL, Mouth Rinse, BID, [name], MD, 15 mL at 12/04/16 0853 .  Chlorhexidine Gluconate Cloth 2 % PADS 6 each, 6 each, Topical, Daily, [name], MD, 6 each at 12/03/16 1000 .  dextrose 5 % solution, , Intravenous, Continuous, [name], MD, Last Rate: 10 mL/hr at 11/30/16 0700 .  feeding supplement (OSMOLITE 1.5 CAL) liquid 1,000 mL, 1,000 mL, Per Tube, Continuous, [name], MD, Last Rate: 50 mL/hr at 12/04/16 1822, 1,000 mL at 12/04/16 1822 .  fentaNYL (SUBLIMAZE) injection 12.5-50 mcg, 12.5-50 mcg, Intravenous, Q6H PRN, Ivin Poot, MD, 50 mcg at 11/30/16 0025 .  free water 200 mL, 200 mL, Per Tube, Q6H, Kara Mead V, MD, 200 mL at 12/05/16 0345 .  Gerhardt's butt cream, , Topical, BID, [name], MD .  heparin ADULT infusion 100 units/mL (25000 units/276m sodium chloride 0.45%), 1,050 Units/hr, Intravenous, Continuous, ERolla Flatten [facility] Last Rate: 10.5 mL/hr at 12/05/16 0827, 1,050 Units/hr at 12/05/16 0827 .  insulin aspart (  novoLOG) injection 0-15 Units, 0-15 Units, Subcutaneous, Q4H, Marijean Heath, NP, 2 Units at 12/05/16 [phone] .  ipratropium (ATROVENT) nebulizer solution 0.5 mg,  0.5 mg, Nebulization, TID, [name], MD, 0.5 mg at 12/05/16 9147 .  levalbuterol (XOPENEX) nebulizer solution 0.63 mg, 0.63 mg, Nebulization, Q6H PRN, Javier Glazier, MD, 0.63 mg at 12/02/16 0500 .  levalbuterol (XOPENEX) nebulizer solution 0.63 mg, 0.63 mg, Nebulization, TID, [name], MD, 0.63 mg at 12/05/16 0929 .  lip balm (BLISTEX) ointment, , Topical, PRN, [name], MD .  magic mouthwash, 5 mL, Oral, TID, Ivin Poot, MD, 5 mL at 12/04/16 2221 .  MEDLINE mouth rinse, 15 mL, Mouth Rinse, q12n4p, [name], MD, 15 mL at 12/04/16 1249 .  pantoprazole sodium (PROTONIX) 40 mg/20 mL oral suspension 40 mg, 40 mg, Per Tube, Q1200, Kara Mead V, MD, 40 mg at 12/04/16 1510 .  potassium chloride 30 mEq in sodium chloride 0.9 % 265 mL (KCL MULTIRUN) IVPB, 30 mEq, Intravenous, Daily PRN, [name], PA-C, 30 mEq at 11/28/16 0555 .  RESOURCE THICKENUP CLEAR, , Oral, PRN, Ivin Poot, MD .  sodium chloride (OCEAN) 0.65 % nasal spray 1 spray, 1 spray, Each Nare, PRN, [name], MD, 1 spray at 11/22/16 2213 .  warfarin (COUMADIN) tablet 2 mg, 2 mg, Oral, q1800, Nani Skillern, PA-C, 2 mg at 12/04/16 1816 .  Warfarin - Physician Dosing Inpatient, , Does not apply, q1800, [name], MD, 1 each at 12/03/16 1800 .  zolpidem (AMBIEN) tablet 5 mg, 5 mg, Oral, QHS PRN, Melrose Nakayama, MD, 5 mg at 12/04/16 2220  Patients Current Diet: DIET - DYS 1 Room service appropriate? Yes; Fluid consistency: Nectar Thick  Precautions / Restrictions Precautions Precautions: Fall Precaution Comments: chest tube Restrictions Weight Bearing Restrictions: No   Has the patient had 2 or more falls or a fall with injury in the past year?No  Prior Activity Level Community (5-7x/wk): Went out in the yard daily.  Went out into community 4 X a week  Development worker, international aid / Equipment Home Assistive Devices/Equipment: Eyeglasses, Dentures (specify  type), Environmental consultant (specify type), Cane (specify quad or straight), Shower chair without back, Blood pressure cuff Home Equipment: Environmental consultant - 2 wheels, Cane - single point, Shower seat  Prior Device Use: Indicate devices/aids used by the patient prior to current illness, exacerbation or injury? None  Prior Functional Level Prior Function Level of Independence: Independent Comments: Driving  Self Care: Did the patient need help bathing, dressing, using the toilet or eating?  Independent  Indoor Mobility: Did the patient need assistance with walking from room to room (with or without device)? Independent  Stairs: Did the patient need assistance with internal or external stairs (with or without device)? Independent  Functional Cognition: Did the patient need help planning regular tasks such as shopping or remembering to take medications? Independent  Current Functional Level Cognition  Overall Cognitive Status: Within Functional Limits for tasks assessed Orientation Level: Oriented X4 General Comments: A&O x 4; anxious with SOB during ambulation    Extremity Assessment (includes Sensation/Coordination)  Upper Extremity Assessment: Generalized weakness  Lower Extremity Assessment: Generalized weakness    ADLs       Mobility  Overal bed mobility: Needs Assistance Bed Mobility: Sit to Supine Rolling: Min assist Sidelying to sit: Min assist, Mod assist Supine to sit: Min assist Sit to supine: Min guard Sit to sidelying: Mod assist General bed mobility  comments: minguard for safety with assist for lines and increased time with use of rail     Transfers  Overall transfer level: Needs assistance Equipment used: 4-wheeled walker Transfers: Sit to/from Stand Sit to Stand: Min guard General transfer comment: Assist for balance and safety    Ambulation / Gait / Stairs / Wheelchair Mobility  Ambulation/Gait Ambulation/Gait assistance: Min guard Ambulation Distance  (Feet): 140 Feet Assistive device: Rolling walker (2 wheeled) Gait Pattern/deviations: Step-through pattern, Decreased stride length, Trunk flexed General Gait Details: Assist for balance and safety. Pt amb on RA with SpO2 >90% when good waveform detected Gait velocity: decr Gait velocity interpretation: Below normal speed for age/gender    Posture / Balance Dynamic Sitting Balance Sitting balance - Comments: pt sat EOB x 3 min; able to shift weight and move hips square to EOB Balance Overall balance assessment: Needs assistance Sitting-balance support: No upper extremity supported, Feet supported Sitting balance-Leahy Scale: Good Sitting balance - Comments: pt sat EOB x 3 min; able to shift weight and move hips square to EOB Standing balance support: No upper extremity supported Standing balance-Leahy Scale: Fair Standing balance comment: heavily relies upon eva walker    Special needs/care consideration BiPAP/CPAP No CPM No Continuous Drip IV Heparin drip Dialysis No      Life Vest No Oxygen No Special Bed No Trach Size No Wound Vac (area) No       Skin Has thin skin                               Bowel mgmt: Last BM 12/05/16 Bladder mgmt: WDL Diabetic mgmt No    Previous Home Environment Living Arrangements: Spouse/significant other Available Help at Discharge: Family, Available 24 hours/day Type of Home: House Home Layout: Multi-level, Bed/bath upstairs (Enter on second floor with bedroom on third floor) Alternate Level Stairs-Number of Steps: flight Home Access: Stairs to enter Entrance Stairs-Rails: Right, Left Entrance Stairs-Number of Steps: 5 Home Care Services: No  Discharge Living Setting Plans for Discharge Living Setting: Patient's home, House, Lives with (comment) (Lives with husband.) Type of Home at Discharge: House Discharge Home Layout: Multi-level Alternate Level Stairs-Number of Steps: 6 steps up to bedroom and bathroom; 10 steps down to den,  bathroom and laundry area. Discharge Home Access: Stairs to enter Entrance Stairs-Number of Steps: 3 steps at front entry Does the patient have any problems obtaining your medications?: No  Social/Family/Support Systems Patient Roles: Spouse, Parent (Has a husband and a son.) Contact Information: Dinorah Masullo - spouse Anticipated Caregiver: Husband Anticipated Caregiver's Contact Information: [name] - husband - (h) [phone] Ability/Limitations of Caregiver: Husband is retired and can assist. Building control surveyor Availability: 24/7 Discharge Plan Discussed with Primary Caregiver: Yes Is Caregiver In Agreement with Plan?: Yes Does Caregiver/Family have Issues with Lodging/Transportation while Pt is in Rehab?: No  Goals/Additional Needs Patient/Family Goal for Rehab: PT/OT/SLP mod I and supervision goals Expected length of stay: 7-12 days Cultural Considerations: [facility] Dietary Needs: Dys 1, nectar thick liquids Equipment Needs: TBD Pt/Family Agrees to Admission and willing to participate: Yes Program Orientation Provided & Reviewed with Pt/Caregiver Including Roles  & Responsibilities: Yes  Decrease burden of Care through IP rehab admission: N/A  Possible need for SNF placement upon discharge: No  Patient Condition: This patient's medical and functional status has changed since the consult dated: 12/01/16 in which the Rehabilitation Physician determined and documented that the patient's condition is appropriate for intensive  rehabilitative care in an inpatient rehabilitation facility. See "History of Present Illness" (above) for medical update. Functional changes are: Currently requiring min guard assist to ambulate 140 feet RW. Patient's medical and functional status update has been discussed with the Rehabilitation physician and patient remains appropriate for inpatient rehabilitation. Will admit to inpatient rehab today.  Preadmission Screen Completed By:  Retta Diones, 12/05/2016 10:44 AM ______________________________________________________________________   Discussed status with Dr. Posey Pronto on 12/05/16 at 1043 and received telephone approval for admission today.  Admission Coordinator:  Retta Diones, time1044/Date02/21/18       Cosigned by

## 2016-12-06 NOTE — Progress Notes (Signed)
Subjective/Complaints:   Objective: Vital Signs: Blood pressure (!) 111/53, pulse 70, temperature 98 F (36.7 C), temperature source Oral, resp. rate 18, height 5' 3.5" (1.613 m), weight 58.1 kg (128 lb), SpO2 100 %. Dg Chest 2 View  Result Date: 12/04/2016 CLINICAL DATA:  71 year old female with history of pleural effusions. COPD. Asthma. History of pneumonia. EXAM: CHEST  2 VIEW COMPARISON:  Chest ray 12/01/2016. FINDINGS: Postoperative changes of right upper lobectomy are noted. Bronchial occlusion devices are noted in the right mid lung. Patchy interstitial and airspace opacities are noted throughout the remaining right middle and lower lobes, most evident throughout the mid to lower lung. Elevation of the right hemidiaphragm. Small right pleural effusion, with some pleural fluid loculated in the apex of the right hemithorax. Left lung is clear. No left pleural effusion. No evidence of pulmonary edema. Heart size is normal. Mediastinal contours are grossly distorted by patient positioning and mediastinal shift to the right related to chronic right-sided volume loss, similar to prior examinations. Aortic atherosclerosis. Feeding tube extends into the stomach (tip below the lower margin of the image). Previously noted right IJ central venous catheter has been withdrawn. IMPRESSION: 1. Overall, there appears to be improved aeration in the right lung, likely related to resolving areas of atelectasis and airspace consolidation in the right middle and lower lobes. 2. Small right pleural effusion similar to the prior study. 3. Aortic atherosclerosis. 4. Support apparatus, as above. Electronically Signed   By: Vinnie Langton M.D.   On: 12/04/2016 09:07   Dg Swallowing Func-speech Pathology  Result Date: 12/05/2016 Objective Swallowing Evaluation: Type of Study: MBS-Modified Barium Swallow Study Patient Details Name: [name] MRN: [id number] Date of Birth: 08-29-1946 Today's Date: 12/05/2016 Time:  SLP Start Time (ACUTE ONLY): 1200-SLP Stop Time (ACUTE ONLY): 1228 SLP Time Calculation (min) (ACUTE ONLY): 28 min Past Medical History: Past Medical History: Diagnosis Date . Arthritis  . Asthma   asthmatic bronchitis . COPD (chronic obstructive pulmonary disease) ([place])  . Dyspnea  . History of kidney stones  . Hyperlipidemia  . Hypertension  . Pneumonia  . PONV (postoperative nausea and vomiting)   none with her hip surgery . Restless leg syndrome  . 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 ABDOMINAL HYSTERECTOMY   . BONE DENSIDEXAY  06/02/2013 . CARDIAC CATHETERIZATION   . COLONOSCOPY  08/05/2015 . CYST EXCISION    from back . CYSTO   . DENTAL EXAMINATION UNDER ANESTHESIA   . DIAGNOSTIC MAMMOGRAM  07/28/2016 . EYE SURGERY Left   cataract surgery with lens implant . LYMPH NODE DISSECTION Right 10/29/2016  Procedure: LYMPH NODE DISSECTION;  Surgeon: [name], MD;  Location: [facility];  Service: Thoracic;  Laterality: Right; . TONSILLECTOMY   . TOTAL HIP ARTHROPLASTY  10/31/2012  Procedure: TOTAL HIP ARTHROPLASTY ANTERIOR APPROACH;  Surgeon: Mcarthur Rossetti, MD;  Location: [facility];  Service: Orthopedics;  Laterality: Right;  Right Total Hip Replacement . VIDEO ASSISTED THORACOSCOPY (VATS)/WEDGE RESECTION Right 10/29/2016  Procedure: RIGHT  VIDEO ASSISTED THORACOSCOPY /RIGHT UPPER LOBE LUNG RESECTION and Right Thoracotomy;  Surgeon: [name], MD;  Location: [facility];  Service: Thoracic;  Laterality: Right; [name] N/A 10/29/2016  Procedure: VIDEO BRONCHOSCOPY;  Surgeon: [name], MD;  Location: [facility];  Service: Thoracic;  Laterality: N/A; . VIDEO BRONCHOSCOPY WITH ENDOBRONCHIAL NAVIGATION N/A 08/29/2016  Procedure: VIDEO BRONCHOSCOPY WITH ENDOBRONCHIAL NAVIGATION;  Surgeon: Herbie Baltimore  Agustina Caroli, MD;  Location: [facility];  Service: Thoracic;  Laterality: N/A; . VIDEO BRONCHOSCOPY WITH INSERTION OF  INTERBRONCHIAL VALVE (IBV) N/A 11/05/2016  Procedure: VIDEO BRONCHOSCOPY WITH INSERTION OF INTERBRONCHIAL VALVE (IBV) times three;  Surgeon: [name], MD;  Location: [facility] OR;  Service: Thoracic;  Laterality: N/A; HPI: 71 y.o. 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 Initially was ambulatory in the ICU and out of bed without difficulty. She later had worsening hypoxia and respiratory status prompting initiation of BiPAP support and PCCM consultation.  Course complicated by shock thought to be sepsis from PNA but no positive cultures, AF with RVR, bilateral LE DVT.  Intubated for hypoxic respiratory failure 1/26.   No Data Recorded Assessment / Plan / Recommendation CHL IP CLINICAL IMPRESSIONS 12/05/2016 Clinical Impression MBS complete. Patient with improving swallowing function. Mild dysphagia persists with oral, pharyngeal, and esophageal components. Combination of laryngeal weakness with decreased hyo-laryngeal elevation, epiglottic deflection, and UES relaxation continues to result in episodes of penetration and post swallow residuals, primarily at UES and along posterior pharynx. Both penetration episodes and residuals however prevented with use of left head turn and dry swallow, signficantly decreasing aspiration risk. Note that patient with appearance of small osteophytes along C-spine (MD not present to confirm) which now that layrngeal/pharyngeal strength is improving are suspected to be contributing to some degree in decreased passage of bolus through UES. These are chronic in nature and while patient likely compensating well prior to admission, with deconditioning, has been unable to compensate as effectively as she once was. Continue to note build up of solid bolus stasis throughout esophagus post swallow raising concern for post prandial  aspiration. Liquid wash assisted to clear. Recommend advancing diet to dysphagia 3, thin liquids with continued use of strict precautions as risk of aspiration related infection continues to be a concern in thish patient with compromised (although improving) pulmonary function.  Discussed with patient and spouse.  SLP Visit Diagnosis Dysphagia, oropharyngeal phase (R13.12) Attention and concentration deficit following -- Frontal lobe and executive function deficit following -- Impact on safety and function Mild aspiration risk;Moderate aspiration risk   CHL IP TREATMENT RECOMMENDATION 12/05/2016 Treatment Recommendations Therapy as outlined in treatment plan below   Prognosis 12/05/2016 Prognosis for Safe Diet Advancement Good Barriers to Reach Goals -- Barriers/Prognosis Comment -- CHL IP DIET RECOMMENDATION 12/05/2016 SLP Diet Recommendations Dysphagia 3 (Mech soft) solids;Thin liquid Liquid Administration via Cup;No straw Medication Administration Crushed with puree Compensations Slow rate;Small sips/bites;Multiple dry swallows after each bite/sip;Follow solids with liquid Postural Changes Seated upright at 90 degrees;Remain semi-upright after after feeds/meals (Comment)   CHL IP OTHER RECOMMENDATIONS 12/05/2016 Recommended Consults Consider esophageal assessment Oral Care Recommendations Oral care BID Other Recommendations --   CHL IP FOLLOW UP RECOMMENDATIONS 12/05/2016 Follow up Recommendations Inpatient Rehab   CHL IP FREQUENCY AND DURATION 12/05/2016 Speech Therapy Frequency (ACUTE ONLY) min 3x week Treatment Duration 2 weeks      CHL IP ORAL PHASE 12/05/2016 Oral Phase WFL Oral - Pudding Teaspoon -- Oral - Pudding Cup -- Oral - Honey Teaspoon -- Oral - Honey Cup -- Oral - Nectar Teaspoon -- Oral - Nectar Cup -- Oral - Nectar Straw -- Oral - Thin Teaspoon -- Oral - Thin Cup -- Oral - Thin Straw -- Oral - Puree -- Oral - Mech Soft -- Oral - Regular -- Oral -  Multi-Consistency -- Oral - Pill -- Oral Phase - Comment  --  CHL IP PHARYNGEAL PHASE 12/05/2016 Pharyngeal Phase Impaired Pharyngeal- Pudding Teaspoon -- Pharyngeal -- Pharyngeal- Pudding Cup -- Pharyngeal -- Pharyngeal- Honey Teaspoon NT Pharyngeal -- Pharyngeal- Honey Cup -- Pharyngeal -- Pharyngeal- Nectar Teaspoon Reduced anterior laryngeal mobility;Reduced tongue base retraction;Reduced epiglottic inversion;Pharyngeal residue - posterior pharnyx;Pharyngeal residue - cp segment;Penetration/Aspiration during swallow Pharyngeal Material enters airway, remains ABOVE vocal cords and not ejected out Pharyngeal- Nectar Cup Reduced anterior laryngeal mobility;Reduced epiglottic inversion;Pharyngeal residue - posterior pharnyx;Pharyngeal residue - cp segment;Reduced tongue base retraction Pharyngeal Material does not enter airway Pharyngeal- Nectar Straw -- Pharyngeal -- Pharyngeal- Thin Teaspoon NT Pharyngeal -- Pharyngeal- Thin Cup Reduced anterior laryngeal mobility;Reduced tongue base retraction;Reduced epiglottic inversion;Pharyngeal residue - posterior pharnyx;Pharyngeal residue - cp segment Pharyngeal Material enters airway, remains ABOVE vocal cords and not ejected out Pharyngeal- Thin Straw -- Pharyngeal -- Pharyngeal- Puree Reduced anterior laryngeal mobility;Reduced tongue base retraction;Reduced epiglottic inversion;Pharyngeal residue - posterior pharnyx;Pharyngeal residue - cp segment;Pharyngeal residue - valleculae Pharyngeal Material does not enter airway Pharyngeal- Mechanical Soft Reduced anterior laryngeal mobility;Reduced tongue base retraction;Reduced epiglottic inversion;Pharyngeal residue - posterior pharnyx;Pharyngeal residue - cp segment;Pharyngeal residue - valleculae Pharyngeal -- Pharyngeal- Regular -- Pharyngeal -- Pharyngeal- Multi-consistency -- Pharyngeal -- Pharyngeal- Pill Pharyngeal residue - pyriform;Reduced epiglottic inversion;Reduced anterior laryngeal mobility Pharyngeal -- Pharyngeal Comment --  CHL IP CERVICAL ESOPHAGEAL PHASE 12/05/2016  Cervical Esophageal Phase Impaired Pudding Teaspoon -- Pudding Cup -- Honey Teaspoon -- Honey Cup -- Nectar Teaspoon -- Nectar Cup -- Nectar Straw -- Thin Teaspoon -- Thin Cup -- Thin Straw -- Puree -- Mechanical Soft -- Regular -- Multi-consistency -- Pill -- Cervical Esophageal Comment decreased UES relaxation Ferdinand Lango MA, CCC-SLP [phone] McCoy Leah [name] 12/05/2016, 3:16 PM              Results for orders placed or performed during the hospital encounter of 12/05/16 (from the past 72 hour(s))  CBC WITH DIFFERENTIAL     Status: Abnormal   Collection Time: 12/06/16  4:04 AM  Result Value Ref Range   WBC 9.1 4.0 - 10.5 K/uL   RBC 3.23 (L) 3.87 - 5.11 MIL/uL   Hemoglobin 9.8 (L) 12.0 - 15.0 g/dL   HCT 87.8 (L) 67.6 - 72.0 %   MCV 94.4 78.0 - 100.0 fL   MCH 30.3 26.0 - 34.0 pg   MCHC 32.1 30.0 - 36.0 g/dL   RDW 94.7 (H) 09.6 - 28.3 %   Platelets 242 150 - 400 K/uL   Neutrophils Relative % 59 %   Neutro Abs 5.4 1.7 - 7.7 K/uL   Lymphocytes Relative 26 %   Lymphs Abs 2.4 0.7 - 4.0 K/uL   Monocytes Relative 10 %   Monocytes Absolute 0.9 0.1 - 1.0 K/uL   Eosinophils Relative 4 %   Eosinophils Absolute 0.4 0.0 - 0.7 K/uL   Basophils Relative 1 %   Basophils Absolute 0.1 0.0 - 0.1 K/uL  Comprehensive metabolic panel     Status: Abnormal   Collection Time: 12/06/16  4:04 AM  Result Value Ref Range   Sodium 137 135 - 145 mmol/L   Potassium 3.9 3.5 - 5.1 mmol/L   Chloride 98 (L) 101 - 111 mmol/L   CO2 30 22 - 32 mmol/L   Glucose, Bld 111 (H) 65 - 99 mg/dL   BUN 13 6 - 20 mg/dL   Creatinine, Ser 6.62 (H) 0.44 - 1.00 mg/dL   Calcium 8.9 8.9 - 10.3  mg/dL   Total Protein 5.8 (L) 6.5 - 8.1 g/dL   Albumin 2.5 (L) 3.5 - 5.0 g/dL   AST 16 15 - 41 U/L   ALT 18 14 - 54 U/L   Alkaline Phosphatase 55 38 - 126 U/L   Total Bilirubin 0.2 (L) 0.3 - 1.2 mg/dL   GFR calc non Af Amer 55 (L) >60 mL/min   GFR calc Af Amer >60 >60 mL/min    Comment: (NOTE) The eGFR has been calculated using the  CKD EPI equation. This calculation has not been validated in all clinical situations. eGFR's persistently <60 mL/min signify possible Chronic Kidney Disease.    Anion gap 9 5 - 15  Heparin level (unfractionated)     Status: None   Collection Time: 12/06/16  4:04 AM  Result Value Ref Range   Heparin Unfractionated 0.63 0.30 - 0.70 IU/mL    Comment:        IF HEPARIN RESULTS ARE BELOW EXPECTED VALUES, AND PATIENT DOSAGE HAS BEEN CONFIRMED, SUGGEST FOLLOW UP TESTING OF ANTITHROMBIN III LEVELS.   Protime-INR     Status: Abnormal   Collection Time: 12/06/16  4:04 AM  Result Value Ref Range   Prothrombin Time 19.0 (H) 11.4 - 15.2 seconds   INR 1.58      HEENT: normal Cardio: RRR Resp: CTA B/L GI: BS positive and NT, ND Extremity:  BS positive and NT, ND Skin:   Other dry skin,  Neuro: Alert/Oriented, Abnormal Sensory reduced in RIght third finger, Abnormal Motor 4/5 in BUE and BLE and Dysarthric Musc/Skel:  Normal and Other no pain with UE or LE ROM Gen NAD   Assessment/Plan: 1. Functional deficits secondary to debilty which require 3+ hours per day of interdisciplinary therapy in a comprehensive inpatient rehab setting. Physiatrist is providing close team supervision and 24 hour management of active medical problems listed below. Physiatrist and rehab team continue to assess barriers to discharge/monitor patient progress toward functional and medical goals. FIM:                                  Medical Problem List and Plan: 1.  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  DVT Prophylaxis/Anticoagulation: DVT peroneal veins of the right and left lower extremities.. Heparin/Coumadin therapy. Monitor for any bleeding episodes 3 . Pain Management:  oxycodone as needed 4. Mood: provide emotional support 5. Neuropsych: This patient is capable of making decisions on her own behalf. 6. Skin/Wound Care: routine skin checks 7. Fluids/Electrolytes/Nutrition: routine I&O with follow-up chemistries 8. Acute on chronic anemia. Follow-up CBC, hgb 9.8 vs 9.5 on 2/21 9. Atrial fibrillation. Amiodarone 200 mg twice a day. Cardiac rate controlled Vitals:   12/05/16 1433 12/06/16 0435  BP: (!) 160/74 (!) 111/53  Pulse: 80 70  Resp: 18 18  Temp: 98.6 F (37 C) 98 F (36.7 C)   10. Dysphagia. Dysphagia #3 thin liquids. Follow-up speech therapy 11. Decreased nutritional storage. Calorie counts  LOS (Days) 1 A FACE TO FACE EVALUATION WAS PERFORMED  Hardeep Reetz E 12/06/2016, 7:37 AM

## 2016-12-06 NOTE — Care Management Note (Signed)
Inpatient Rehabilitation Center Individual Statement of Services  Patient Name:  [name]  Date:  12/06/2016  Welcome to the [facility].  Our goal is to provide you with an individualized program based on your diagnosis and situation, designed to meet your specific needs.  With this comprehensive rehabilitation program, you will be expected to participate in at least 3 hours of rehabilitation therapies Monday-Friday, with modified therapy programming on the weekends.  Your rehabilitation program will include the following services:  Physical Therapy (PT), Occupational Therapy (OT), Speech Therapy (ST), 24 hour per day rehabilitation nursing, Therapeutic Recreaction (TR), Neuropsychology, Case Management (Social Worker), Rehabilitation Medicine, Nutrition Services and Pharmacy Services  Weekly team conferences will be held on Wednesday to discuss your progress.  Your Social Worker will talk with you frequently to get your input and to update you on team discussions.  Team conferences with you and your family in attendance may also be held.  Expected length of stay: 5-7 days Overall anticipated outcome: mod/i level  Depending on your progress and recovery, your program may change. Your Social Worker will coordinate services and will keep you informed of any changes. Your Social Worker's name and contact numbers are listed  below.  The following services may also be recommended but are not provided by the [facility] will be made to provide these services after discharge if needed.  Arrangements include referral to agencies that provide these services.  Your insurance has been verified to be:  [place] primary doctor is:  [name]  Pertinent information will be shared with your doctor and your insurance  company.  Social Worker:  Ovidio Kin, [place] or ([phone]  Information discussed with and copy given to patient by: Elease Hashimoto, 12/06/2016, 10:33 AM

## 2016-12-06 NOTE — Evaluation (Signed)
Physical Therapy Assessment and Plan  Patient Details  Name: [name] MRN: [id number] Date of Birth: 19-Aug-1946  PT Diagnosis: Difficulty walking and Muscle weakness Rehab Potential: Excellent ELOS: 5-7 days    Today's Date: 12/06/2016 PT Individual Time: 1101-1203 PT Individual Time Calculation (min): 62 min    Problem List:  Patient Active Problem List   Diagnosis Date Noted  . Critical illness neuropathy ([place]) 12/05/2016  . Debility   . Non-small cell cancer of right lung ([place])   . Deep venous thrombosis ([place])   . Acute blood loss anemia   . Anemia of chronic disease   . PAF (paroxysmal atrial fibrillation) ([place])   . Dysphagia   . Atelectasis   . Acute renal failure (ARF) ([place])   . Surgery follow-up   . Chest tube in place   . Acute respiratory failure with hypoxia ([place])   . Hypotension   . S/P lobectomy of lung 11/02/2016  . Malignant neoplasm of upper lobe of right lung ([place]) 10/29/2016  . COPD (chronic obstructive pulmonary disease) ([address]) 09/27/2016  . Lung mass 08/21/2016  . Tobacco use disorder 08/21/2016  . Shortness of breath 08/21/2016  . Degenerative arthritis of hip 10/31/2012    Past Medical History:  Past Medical History:  Diagnosis Date  . Arthritis   . Asthma    asthmatic bronchitis  . COPD (chronic obstructive pulmonary disease) ([place])   . Dyspnea   . History of kidney stones   . Hyperlipidemia   . Hypertension   . Pneumonia   . PONV (postoperative nausea and vomiting)    none with her hip surgery  . Restless leg syndrome   . 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 ABDOMINAL HYSTERECTOMY    . BONE DENSIDEXAY  06/02/2013  . CARDIAC CATHETERIZATION    . COLONOSCOPY  08/05/2015  . CYST EXCISION     from back  . CYSTO    . DENTAL EXAMINATION UNDER ANESTHESIA    . DIAGNOSTIC MAMMOGRAM  07/28/2016  . EYE SURGERY Left    cataract  surgery with lens implant  . LYMPH NODE DISSECTION Right 10/29/2016   Procedure: LYMPH NODE DISSECTION;  Surgeon: [name], MD;  Location: [facility];  Service: Thoracic;  Laterality: Right;  . TONSILLECTOMY    . TOTAL HIP ARTHROPLASTY  10/31/2012   Procedure: TOTAL HIP ARTHROPLASTY ANTERIOR APPROACH;  Surgeon: Mcarthur Rossetti, MD;  Location: [facility];  Service: Orthopedics;  Laterality: Right;  Right Total Hip Replacement  . VIDEO ASSISTED THORACOSCOPY (VATS)/WEDGE RESECTION Right 10/29/2016   Procedure: RIGHT  VIDEO ASSISTED THORACOSCOPY /RIGHT UPPER LOBE LUNG RESECTION and Right Thoracotomy;  Surgeon: [name], MD;  Location: [facility];  Service: Thoracic;  Laterality: Right;  [name] N/A 10/29/2016   Procedure: VIDEO BRONCHOSCOPY;  Surgeon: [name], MD;  Location: [facility];  Service: Thoracic;  Laterality: N/A;  . VIDEO BRONCHOSCOPY WITH ENDOBRONCHIAL NAVIGATION N/A 08/29/2016   Procedure: VIDEO BRONCHOSCOPY WITH ENDOBRONCHIAL NAVIGATION;  Surgeon: Collene Gobble, MD;  Location: [facility];  Service: Thoracic;  Laterality: N/A;  . VIDEO BRONCHOSCOPY WITH INSERTION OF INTERBRONCHIAL VALVE (IBV) N/A 11/05/2016   Procedure: VIDEO BRONCHOSCOPY WITH INSERTION OF INTERBRONCHIAL VALVE (IBV) times three;  Surgeon: [name], MD;  Location: [facility];  Service: Thoracic;  Laterality: N/A;    Assessment & Plan Clinical Impression: Patient is a  71 y.o. right handed femalewith history of asthma, hypertension, tobacco abuse, hyperlipidemia and NSCLC followed by oncology services Dr. Earlie Server s/p recent right lobectomy/VATS 10/29/2016 who developed a persistent airleak followed closely by pulmonary services as well as cardiothoracic surgery. History taken from chart review and patient. Admitted 11/05/2016 and  underwent endobronchial valve replacement x3 on 11/05/16 for treatment per Dr. Servando Snare. Postoperative echocardiogram with ejection fraction of 86% grade 1 diastolic  dysfunction. Course complicated by respiratory failure and sepsis requiring intubation as well as right sided chest tube, bilateral LE DVT's with acute DVT involving the peroneal veins on the right and left lower extremities requiring intravenous heparin and transitioned to Coumadin. She was extubated on 11/14/16. She was advanced to D3 thin diet. Acute on chronic anemia monitored. Intermittent atrial fibrillation postoperatively cardiac rate controlled currently maintained on amiodarone. CXR 2/20 reviewed, improved with right pleural effusion. Due to ongoing issues with stamina and swallowing PM&R was asked to see the patient regarding recommendation for further rehab.  Patient transferred to CIR on 12/05/2016 .   Patient currently requires min with mobility secondary to muscle weakness, decreased cardiorespiratoy endurance and decreased oxygen support and decreased standing balance and decreased balance strategies.  Prior to hospitalization, patient was independent  with mobility and lived with   in a House home.  Home access is 5Stairs to enter.  Patient will benefit from skilled PT intervention to maximize safe functional mobility, minimize fall risk and decrease caregiver burden for planned discharge home with intermittent assist.  Anticipate patient will benefit from follow up [facility] at discharge.  PT - End of Session Activity Tolerance: Tolerates 10 - 20 min activity with multiple rests Endurance Deficit: Yes PT Assessment Rehab Potential (ACUTE/IP ONLY): Excellent Barriers to Discharge: Inaccessible home environment PT Patient demonstrates impairments in the following area(s): Balance;Endurance;Safety PT Transfers Functional Problem(s): Bed Mobility;Bed to Chair;Car;Furniture;Floor PT Locomotion Functional Problem(s): Ambulation;Wheelchair Mobility;Stairs PT Plan PT Intensity: Minimum of 1-2 x/day ,45 to 90 minutes PT Frequency: 5 out of 7 days PT Duration Estimated Length of Stay: 5-7 days  PT  Treatment/Interventions: Ambulation/gait training;Balance/vestibular training;Community reintegration;Discharge planning;Disease management/prevention;DME/adaptive equipment instruction;Functional mobility training;Neuromuscular re-education;Pain management;Patient/family education;Psychosocial support;Skin care/wound management;Stair training;Therapeutic Activities;Therapeutic Exercise;UE/LE Strength taining/ROM;UE/LE Coordination activities;Visual/perceptual remediation/compensation;Wheelchair propulsion/positioning PT Transfers Anticipated Outcome(s): mod I with LRAD  PT Locomotion Anticipated Outcome(s): Mod I with LRAD  PT Recommendation Follow Up Recommendations: Home health PT Patient destination: Home Equipment Recommended: To be determined Equipment Details: has RW   Skilled Therapeutic Intervention PT instructed patient in PT Evaluation and initiated treatment intervention; see below for results. PT educated patient in [place], rehab potential, rehab goals, and discharge recommendations. As stated, pt requires min assist for all transfers and gait without AD also demonstrated supervision assist with RW. PT instructed patient in berg balance test; see below for details. Patient demonstrates increased fall risk as noted by score of  42/56 on Berg Balance Scale.  (<36= high risk for falls, close to 100%; 37-45 significant >80%; 46-51 moderate >50%; 52-55 lower >25%) Patient returned to room and left sitting in [facility] with call bell in reach and all needs met.       PT Evaluation Precautions/Restrictions Restrictions Weight Bearing Restrictions: No General   Vital SignsTherapy Vitals Pulse Rate: 86 Resp: 18 Patient Position (if appropriate): Lying Oxygen Therapy SpO2: 93 % O2 Device: Not Delivered Pain Pain Assessment Pain Assessment: No/denies pain Pain Score: 0-No pain Home Living/Prior Functioning Home Living Available Help at Discharge: Family;Available 24 hours/day Type of Home:  House  Home Access: Stairs to enter Entrance Stairs-Number of Steps: 5 Entrance Stairs-Rails: Right;Left Home Layout: Multi-level;Bed/bath upstairs (Enter on second floor with bedroom on third floor) Alternate Level Stairs-Number of Steps: 11 and 6 with one rail Alternate Level Stairs-Rails: Right Prior Function Level of Independence: Independent with basic ADLs;Independent with homemaking with wheelchair;Independent with gait  Able to Take Stairs?: Yes Driving: Yes Comments: Driving Vision/Perception  Perception Comments: WFL  Cognition Overall Cognitive Status: Within Functional Limits for tasks assessed Orientation Level: Oriented X4 Memory: Appears intact Awareness: Appears intact Problem Solving: Appears intact Safety/Judgment: Appears intact Sensation Sensation Light Touch: Appears Intact Proprioception: Appears Intact Coordination Gross Motor Movements are Fluid and Coordinated: Yes Fine Motor Movements are Fluid and Coordinated: Yes Heel Shin Test: [facility] Motor  Motor Motor: Other (comment) Motor - Skilled Clinical Observations: Generalized weakness  Mobility Bed Mobility Bed Mobility: Rolling Right;Rolling Left;Supine to Sit;Sit to Supine Rolling Right: 5: Supervision Rolling Right Details: Verbal cues for precautions/safety Rolling Left: 5: Supervision Rolling Left Details: Verbal cues for precautions/safety Supine to Sit: 5: Supervision Supine to Sit Details: Verbal cues for precautions/safety Sit to Supine: 5: Supervision Sit to Supine - Details: Verbal cues for precautions/safety Transfers Transfers: Yes Sit to Stand: 4: Min assist Sit to Stand Details: Verbal cues for precautions/safety Squat Pivot Transfers: 4: Min Risk manager Details: Verbal cues for precautions/safety Locomotion  Ambulation Ambulation: Yes Ambulation/Gait Assistance: 4: Min assist Ambulation Distance (Feet): 150 Feet Assistive device: None Gait Gait: Yes Gait  Pattern: Impaired Gait Pattern: Narrow base of support;Decreased step length - right;Decreased step length - left Stairs / Additional Locomotion Stairs: Yes Stairs Assistance: 4: Min assist Stair Management Technique: One rail Right;Two rails Number of Stairs: 8 Height of Stairs: 6 Ramp: 4: Min assist (with RW) Wheelchair Mobility Wheelchair Mobility: No  Trunk/Postural Assessment  Cervical Assessment Cervical Assessment: Within Functional Limits Thoracic Assessment Thoracic Assessment: Within Functional Limits Lumbar Assessment Lumbar Assessment: Within Functional Limits Postural Control Postural Control: Within Functional Limits  Balance Balance Balance Assessed: Yes Standardized Balance Assessment Standardized Balance Assessment: Berg Balance Test Berg Balance Test Sit to Stand: Able to stand  independently using hands Standing Unsupported: Able to stand safely 2 minutes Sitting with Back Unsupported but Feet Supported on Floor or Stool: Able to sit safely and securely 2 minutes Stand to Sit: Controls descent by using hands Transfers: Able to transfer safely, definite need of hands Standing Unsupported with Eyes Closed: Able to stand 10 seconds with supervision Standing Ubsupported with Feet Together: Able to place feet together independently and stand for 1 minute with supervision From Standing, Reach Forward with Outstretched Arm: Can reach forward >12 cm safely (5") From Standing Position, Pick up Object from Floor: Able to pick up shoe safely and easily From Standing Position, Turn to Look Behind Over each Shoulder: Looks behind from both sides and weight shifts well Turn 360 Degrees: Able to turn 360 degrees safely one side only in 4 seconds or less Standing Unsupported, Alternately Place Feet on Step/Stool: Able to complete >2 steps/needs minimal assist Standing Unsupported, One Foot in Front: Able to plae foot ahead of the other independently and hold 30  seconds Standing on One Leg: Tries to lift leg/unable to hold 3 seconds but remains standing independently Total Score: 42 Static Sitting Balance Static Sitting - Balance Support: No upper extremity supported Static Sitting - Level of Assistance: 6: Modified independent (Device/Increase time) Dynamic Sitting Balance Dynamic Sitting - Balance Support: No upper extremity supported Dynamic  Sitting - Level of Assistance: 5: Stand by assistance Static Standing Balance Static Standing - Balance Support: No upper extremity supported Static Standing - Level of Assistance: 5: Stand by assistance Dynamic Standing Balance Dynamic Standing - Balance Support: No upper extremity supported Dynamic Standing - Level of Assistance: 4: Min assist Dynamic Standing - Balance Activities: Reaching for objects Extremity Assessment      RLE Assessment RLE Assessment: Within Functional Limits (grossly 4/5 to 4+/5 proximal to distal) LLE Assessment LLE Assessment: Within Functional Limits (grossly 4+/5 to 5/5 proximal to distal )   See Function Navigator for Current Functional Status.   Refer to Care Plan for Long Term Goals  Recommendations for other services: None   Discharge Criteria: Patient will be discharged from PT if patient refuses treatment 3 consecutive times without medical reason, if treatment goals not met, if there is a change in medical status, if patient makes no progress towards goals or if patient is discharged from hospital.  The above assessment, treatment plan, treatment alternatives and goals were discussed and mutually agreed upon: by patient  Lorie Phenix 12/06/2016, 12:02 PM

## 2016-12-06 NOTE — Progress Notes (Addendum)
      [address]       [place] [address]             [phone]           Subjective: Patient just finished washing hands at sink. She states all her fingers on her right hand have a different sensation than those of the left hand. She states the fingers are not completely numb. She is very happy that Cortrak was removed yesterday. Tolerating diet without problems.  Objective: Vital signs in last 24 hours: Temp:  [98 F (36.7 C)-98.6 F (37 C)] 98 F (36.7 C) (02/22 0435) Pulse Rate:  [70-80] 70 (02/22 0435) Cardiac Rhythm: Normal sinus rhythm (02/21 1353) Resp:  [17-18] 18 (02/22 0435) BP: (111-163)/(53-84) 111/53 (02/22 0435) SpO2:  [95 %-100 %] 100 % (02/22 0435) Weight:  [128 lb (58.1 kg)-128 lb 1.6 oz (58.1 kg)] 128 lb (58.1 kg) (02/22 0435)     Intake/Output from previous day: 02/21 0701 - 02/22 0700 In: 340 [P.O.:340] Out: -    Physical Exam:  Cardiovascular: RRR Pulmonary: Slightly diminished at bases Abdomen: Soft, non tender, bowel sounds present. Extremities: No lower extremity edema. Wounds: Clean and dry   Lab Results: CBC:  Recent Labs  12/05/16 0342 12/06/16 0404  WBC 10.4 9.1  HGB 9.5* 9.8*  HCT 29.7* 30.5*  PLT 244 242   BMET:   Recent Labs  12/06/16 0404  NA 137  K 3.9  CL 98*  CO2 30  GLUCOSE 111*  BUN 13  CREATININE 1.01*  CALCIUM 8.9    PT/INR:   Recent Labs  12/06/16 0404  LABPROT 19.0*  INR 1.58   ABG:  INR: Will add last result for INR, ABG once components are confirmed Will add last 4 CBG results once components are confirmed  Assessment/Plan:  1. CV - SR. On Amiodarone 200 mg bid via tube, Heparin drip, and Coumadin (bilateral DVTs, previous a fib). INR with slight increase from 1.54 to 1.58 . Will stop Heparin drip once INR closer to 2. Will give 4 mg of Coumadin tonight. 2.  Pulmonary - On room air. 3. GI-Tolerating a dysphagia 3 diet, ensure.  4. Anemia-H and H stable at 9.5 and  29.7 5. Will arrange follow up appointment   I have seen and examined [name] and agree with the above assessment  and plan.  [name] MD Beeper [phone] Office [phone] 12/06/2016 8:33 AM

## 2016-12-07 ENCOUNTER — Inpatient Hospital Stay (HOSPITAL_COMMUNITY): Payer: Medicare Other | Admitting: Physical Therapy

## 2016-12-07 ENCOUNTER — Inpatient Hospital Stay (HOSPITAL_COMMUNITY): Payer: Medicare Other | Admitting: Occupational Therapy

## 2016-12-07 ENCOUNTER — Inpatient Hospital Stay (HOSPITAL_COMMUNITY): Payer: Medicare Other | Admitting: Speech Pathology

## 2016-12-07 LAB — CBC
HEMATOCRIT: 29.4 % — AB (ref 36.0–46.0)
Hemoglobin: 9.6 g/dL — ABNORMAL LOW (ref 12.0–15.0)
MCH: 30.6 pg (ref 26.0–34.0)
MCHC: 32.7 g/dL (ref 30.0–36.0)
MCV: 93.6 fL (ref 78.0–100.0)
PLATELETS: 256 10*3/uL (ref 150–400)
RBC: 3.14 MIL/uL — AB (ref 3.87–5.11)
RDW: 17.2 % — ABNORMAL HIGH (ref 11.5–15.5)
WBC: 8.6 10*3/uL (ref 4.0–10.5)

## 2016-12-07 LAB — HEPARIN LEVEL (UNFRACTIONATED): Heparin Unfractionated: 0.65 IU/mL (ref 0.30–0.70)

## 2016-12-07 LAB — PROTIME-INR
INR: 2.08
Prothrombin Time: 23.7 seconds — ABNORMAL HIGH (ref 11.4–15.2)

## 2016-12-07 MED ORDER — WARFARIN SODIUM 2 MG PO TABS
2.0000 mg | ORAL_TABLET | Freq: Every day | ORAL | Status: DC
  Administered 2016-12-07: 2 mg via ORAL
  Filled 2016-12-07: qty 1

## 2016-12-07 NOTE — Progress Notes (Signed)
Speech Language Pathology Daily Session Note  Patient Details  Name: [name] MRN: [id number] Date of Birth: 1946/06/15  Today's Date: 12/07/2016 SLP Individual Time: 4496-7591 SLP Individual Time Calculation (min): 15 min  Short Term Goals: Week 1: SLP Short Term Goal 1 (Week 1): STGs=LTGs  Skilled Therapeutic Interventions: Skilled treatment session focused on dysphagia goals. SLP facilitated session by providing supervision with use of compensatory swallow strategies. Pt consumed thin liquids via cup with supervision use of head turn. Pt able to attempt pharyngeal strengthening exercises, able to set up exercise but not able to perform any masako exercises. Pt to perform 5 with use of towel. Pt was left in care of RT and all needs within reach.        Function:  Eating Eating   Modified Consistency Diet: No Eating Assist Level: Swallowing techniques: self managed           Cognition Comprehension Comprehension assist level: Follows complex conversation/direction with extra time/assistive device  Expression   Expression assist level: Expresses complex 90% of the time/cues < 10% of the time  Social Interaction Social Interaction assist level: Interacts appropriately 90% of the time - Needs monitoring or encouragement for participation or interaction.  Problem Solving Problem solving assist level: Solves complex 90% of the time/cues < 10% of the time  Memory Memory assist level: More than reasonable amount of time    Pain Pain Assessment Pain Assessment: No/denies pain  Therapy/Group: Individual Therapy   Arav Bannister B. Rutherford Nail, M.S., CCC-SLP Speech-Language Pathologist   Shanie Mauzy 12/07/2016, 3:46 PM

## 2016-12-07 NOTE — Progress Notes (Addendum)
INR increased from 1.58 to 2.08. Will stop Heparin drip and continue with Coumadin 2 mg daily.   I have seen and examined [name] and agree with tplan to stop heparin drip .  [name] MD Beeper [phone] Office [phone] 12/07/2016 3:26 PM

## 2016-12-07 NOTE — Progress Notes (Signed)
Occupational Therapy Session Note  Patient Details  Name: [name] MRN: [id number] Date of Birth: [birth year]  Today's Date: 12/07/2016 OT Individual Time: 4035-2481 OT Individual Time Calculation (min): 78 min    Short Term Goals: Week 1:  OT Short Term Goal 1 (Week 1): STGs equal to LTGs set at modified independent level based on ELOS.  Skilled Therapeutic Interventions/Progress Updates:    Pt completed shower and dressing to begin session.  Close supervision for mobility to and from the bathroom, to the shower seat.  Supervision for all bathing sit to stand before transferring out to the EOB for dressing tasks.  She completed all dressing with close supervision except TEDs sit to stand as well.  Worked on [employer] tasks in standing and cleaning up dirty linens and towels, without assistive device.  Pt needing multiple rest breaks to complete this with oxygen sats at 93-96% and HR in the mid 90s as well.  Occasional LOB with turns requiring min assist to regain balance.  Next part of session had pt ambulate to the ADL apartment for work on simple living skills of making up the bed.  Min guard assist for mobility with occasional need for min assist with turns again.  Pt needed 3-4 rest breaks to complete the task before returning back to the room.  Pt left in bedside recliner with spouse present and call button in reach.    Therapy Documentation Precautions:  Precautions Precautions: Fall Restrictions Weight Bearing Restrictions: No  Pain: Pain Assessment Pain Assessment: No/denies pain ADL: See Function Navigator for Current Functional Status.   Therapy/Group: Individual Therapy  Teresa Lemmerman OTR/L 12/07/2016, 12:24 PM

## 2016-12-07 NOTE — Discharge Instructions (Addendum)
Inpatient Rehab Discharge Instructions  [name] Discharge date and time: No discharge date for patient encounter.   Activities/Precautions/ Functional Status: Activity: activity as tolerated Diet: soft Wound Care: none needed Functional status:  ___ No restrictions     ___ Walk up steps independently ___ 24/7 supervision/assistance   ___ Walk up steps with assistance ___ Intermittent supervision/assistance  ___ Bathe/dress independently ___ Walk with walker     _x__ Bathe/dress with assistance ___ Walk Independently    ___ Shower independently ___ Walk with assistance    ___ Shower with assistance ___ No alcohol     ___ Return to work/school ________  Special Instructions: [place] INR ON Monday 12/18/2016 RESULTS TO DR [name] [phone] SAY [phone]    COMMUNITY REFERRALS UPON DISCHARGE:    Home Health:   PT, OT, RN, SP  Agency:[facility] Phone:[phone]   Date of last service:12/12/2016  Medical Equipment/Items Ordered:NO NEEDS HAS FROM PREVIOUS ADMITS     My questions have been answered and I understand these instructions. I will adhere to these goals and the provided educational materials after my discharge from the hospital.  Patient/Caregiver Signature _______________________________ Date __________  Clinician Signature _______________________________________ Date __________  Please bring this form and your medication list with you to all your follow-up doctor's appointments. Thoracotomy, Care After This sheet gives you information about how to care for yourself after your procedure. Your doctor may also give you more specific instructions. If you have problems or questions, contact your doctor. Follow these instructions at home: Preventing lung infection ( pneumonia)  Take deep breaths or do breathing exercises as told by your doctor.  Cough often. Coughing is important to clear thick spit (phlegm) and open your lungs. If coughing  hurts, hold a pillow against your chest or place both hands flat on top of your cut (splinting) when you cough. This may help with discomfort.  Use an incentive spirometer as told. This is a tool that measures how well you fill your lungs with each breath.  Do lung therapy (pulmonary rehabilitation) as told. Medicines  Take over-the-counter or prescription medicines only as told by your doctor.  If you have pain, take pain-relieving medicine before your pain gets very bad. This will help you breathe and cough more comfortably.  If you were prescribed an antibiotic medicine, take it as told by your doctor. Do not stop taking the antibiotic even if you start to feel better. Activity  Ask your doctor what activities are safe for you.  Do not travel by airplane for 2 weeks after your chest tube is removed, or until your doctor says that this is safe.  Do not lift anything that is heavier than 10 lb (4.5 kg), or the limit that your doctor tells you, until he or she says that it is safe.  Do not drive until your doctor approves.  Do not drive or use heavy machinery while taking prescription pain medicine. Incision care  Follow instructions from your doctor about how to take care of your cut from surgery (incision). Make sure you:  Wash your hands with soap and water before you change your bandage (dressing). If you cannot use soap and water, use hand sanitizer.  Change your bandage as told by your doctor.  Leave stitches (sutures), skin glue, or skin tape (adhesive) strips in place. They may need to stay in place for 2 weeks or longer. If tape strips get loose and curl up, you may trim  the loose edges. Do not remove tape strips completely unless your doctor says it is okay.  Keep your bandage dry.  Check your cut from surgery every day for signs of infection. Check for:  More redness, swelling, or pain.  More fluid or blood.  Warmth.  Pus or a bad smell. Bathing  Do not take  baths, swim, or use a hot tub until your doctor approves. You may take showers.  After your bandage has been removed, use soap and water to gently wash your cut from surgery. Do not use anything else to clean your cut unless your doctor tells you to. Eating and drinking  Eat a healthy diet as told by your doctor. A healthy diet includes:  Fresh fruits and vegetables.  Whole grains.  Low-fat (lean) proteins.  Drink enough fluid to keep your pee (urine) clear or pale yellow. General instructions  To prevent or treat trouble pooping (constipation) while you are taking prescription pain medicine, your doctor may recommend that you:  Take over-the-counter or prescription medicines.  Eat foods that are high in fiber. These include fresh fruits and vegetables, whole grains, and beans.  Limit foods that are high in fat and processed sugars, such as fried and sweet foods.  Do not use any products that contain nicotine or tobacco. These include cigarettes and e-cigarettes. If you need help quitting, ask your doctor.  Avoid secondhand smoke.  Wear compression stockings as told. These help to prevent blood clots and reduce swelling in your legs.  If you have a chest tube, care for it as told.  Keep all follow-up visits as told by your doctor. This is important. Contact a doctor if:  You have more redness, swelling, or pain around your cut from surgery.  You have more fluid or blood coming from your cut from surgery.  Your cut from surgery feels warm to the touch.  You have pus or a bad smell coming from your cut from surgery.  You have a fever or chills.  Your heartbeat seems uneven.  You feel sick to your stomach (nauseous).  You throw up (vomit).  You have muscle aches.  You have trouble pooping (having a bowel movement). This may mean that you:  Poop fewer times in a week than normal.  Have a hard time pooping.  Have poop that is dry, hard, or bigger than  normal. Get help right away if:  You get a rash.  You feel light-headed.  You feel like you might pass out (faint).  You are short of breath.  You have trouble breathing.  You are confused.  You have trouble talking.  You have problems with your seeing (vision).  You are not able to move.  You lose feeling (have numbness) in your:  Face.  Arms.  Legs.  You pass out.  You have a sudden, bad headache.  You feel weak.  You have chest pain.  You have pain that:  Is very bad.  Gets worse, even with medicine. Summary  Take deep breaths, do breathing exercises, and cough often. This helps prevent lung infection (pneumonia).  Do not drive until your doctor approves. Do not travel by airplane for 2 weeks after your chest tube is removed, or until your doctor says that this is safe.  Check your cut from surgery every day for signs of infection.  Eat a healthy diet. This includes fresh fruits and vegetables, whole grains, and low-fat (lean) proteins. This information is not intended to  replace advice given to you by your health care provider. Make sure you discuss any questions you have with your health care provider. Document Released: 04/01/2012 Document Revised: 06/25/2016 Document Reviewed: 06/25/2016 Elsevier Interactive Patient Education  2017 [place] on my medicine - Coumadin   (Warfarin)  This medication education was reviewed with me or my healthcare representative as part of my discharge preparation.    Why was Coumadin prescribed for you? Coumadin was prescribed for you because you have a blood clot or a medical condition that can cause an increased risk of forming blood clots. Blood clots can cause serious health problems by blocking the flow of blood to the heart, lung, or brain. Coumadin can prevent harmful blood clots from forming. As a reminder your indication for Coumadin is:   Deep Vein Thrombosis Treatment  What test will  check on my response to Coumadin? While on Coumadin (warfarin) you will need to have an INR test regularly to ensure that your dose is keeping you in the desired range. The INR (international normalized ratio) number is calculated from the result of the laboratory test called prothrombin time (PT).  If an INR APPOINTMENT HAS NOT ALREADY BEEN MADE FOR YOU please schedule an appointment to have this lab work done by your health care provider within 7 days. Your INR goal is usually a number between:  2 to 3 or your provider may give you a more narrow range like 2-2.5.  Ask your health care provider during an office visit what your goal INR is.  What  do you need to  know  About  COUMADIN? Take Coumadin (warfarin) exactly as prescribed by your healthcare provider about the same time each day.  DO NOT stop taking without talking to the doctor who prescribed the medication.  Stopping without other blood clot prevention medication to take the place of Coumadin may increase your risk of developing a new clot or stroke.  Get refills before you run out.  What do you do if you miss a dose? If you miss a dose, take it as soon as you remember on the same day then continue your regularly scheduled regimen the next day.  Do not take two doses of Coumadin at the same time.  Important Safety Information A possible side effect of Coumadin (Warfarin) is an increased risk of bleeding. You should call your healthcare provider right away if you experience any of the following: ? Bleeding from an injury or your nose that does not stop. ? Unusual colored urine (red or dark brown) or unusual colored stools (red or black). ? Unusual bruising for unknown reasons. ? A serious fall or if you hit your head (even if there is no bleeding).  Some foods or medicines interact with Coumadin (warfarin) and might alter your response to warfarin. To help avoid this: ? Eat a balanced diet, maintaining a consistent amount of Vitamin  K. ? Notify your provider about major diet changes you plan to make. ? Avoid alcohol or limit your intake to 1 drink for women and 2 drinks for men per day. (1 drink is 5 oz. wine, 12 oz. beer, or 1.5 oz. liquor.)  Make sure that ANY health care provider who prescribes medication for you knows that you are taking Coumadin (warfarin).  Also make sure the healthcare provider who is monitoring your Coumadin knows when you have started a new medication including herbals and non-prescription products.  Coumadin (Warfarin)  Major Drug Interactions  Increased Warfarin Effect Decreased Warfarin Effect  Alcohol (large quantities) Antibiotics (esp. Septra/Bactrim, Flagyl, Cipro) Amiodarone (Cordarone) Aspirin (ASA) Cimetidine (Tagamet) Megestrol (Megace) NSAIDs (ibuprofen, naproxen, etc.) Piroxicam (Feldene) Propafenone (Rythmol SR) Propranolol (Inderal) Isoniazid (INH) Posaconazole (Noxafil) Barbiturates (Phenobarbital) Carbamazepine (Tegretol) Chlordiazepoxide (Librium) Cholestyramine (Questran) Griseofulvin Oral Contraceptives Rifampin Sucralfate (Carafate) Vitamin K   Coumadin (Warfarin) Major Herbal Interactions  Increased Warfarin Effect Decreased Warfarin Effect  Garlic Ginseng Ginkgo biloba Coenzyme Q10 Green tea St. Johns wort    Coumadin (Warfarin) FOOD Interactions  Eat a consistent number of servings per week of foods HIGH in Vitamin K (1 serving =  cup)  Collards (cooked, or boiled & drained) Kale (cooked, or boiled & drained) Mustard greens (cooked, or boiled & drained) Parsley *serving size only =  cup Spinach (cooked, or boiled & drained) Swiss chard (cooked, or boiled & drained) Turnip greens (cooked, or boiled & drained)  Eat a consistent number of servings per week of foods MEDIUM-HIGH in Vitamin K (1 serving = 1 cup)  Asparagus (cooked, or boiled & drained) Broccoli (cooked, boiled & drained, or raw & chopped) Brussel sprouts (cooked, or boiled &  drained) *serving size only =  cup Lettuce, raw (green leaf, endive, romaine) Spinach, raw Turnip greens, raw & chopped   These websites have more information on Coumadin (warfarin):  FailFactory.se; VeganReport.com.au;

## 2016-12-07 NOTE — Progress Notes (Signed)
Day 1 of 2: Calorie Count Note  48 hour calorie count ordered.  Diet: Dysphagia 3 diet with thin liquids.  Supplements:   Boost Breeze po TID, each supplement provides 250 kcal and 9 grams of protein.  Magic cup BID between meals, each supplement provides 290 kcal and 9 grams of protein.  Breakfast: 159 kcal, 5 grams of protein Lunch: 575 kcal, 21 grams of protein Dinner: no information provided Supplements: 500 kcal, 18 grams of protein  Day 1 Total intake: 1234 kcal (73% of minimum estimated needs)  44 grams of protein (63% of minimum estimated needs)  Estimated Nutritional Needs:  Kcal:  1700-1900 Protein:  70-80 grams Fluid:  1.7 - 1.9 L/day  Nutrition Dx:  Inadequate oral intake related to poor appetite as evidenced by per patient/family report; ongoing  Goal:  Patient will meet greater than or equal to 90% of their needs; progressing  Intervention:   Continue 48 hour calorie count.  Continue Boost Breeze po TID, each supplement provides 250 kcal and 9 grams of protein.  Continue Magic cup BID with meals, each supplement provides 290 kcal and 9 grams of protein.   RD to follow up Monday, 12/10/16, with day 2 calorie count results.  Corrin Parker, MS, RD, LDN Pager # [phone] After hours/ weekend pager # [phone]

## 2016-12-07 NOTE — Progress Notes (Signed)
Subjective/Complaints: Some SOB this am, O2 sat 98%  Received breathing treatment early, some anxiety ROS  No N/V/D, no leg pain, still with tingling in R fingers but no pain Objective: Vital Signs: Blood pressure (!) 144/77, pulse 88, temperature 97.6 F (36.4 C), temperature source Oral, resp. rate 18, height 5' 3.5" (1.613 m), weight 57.9 kg (127 lb 10.3 oz), SpO2 97 %. Dg Swallowing Func-speech Pathology  Result Date: 12/05/2016 Objective Swallowing Evaluation: Type of Study: MBS-Modified Barium Swallow Study Patient Details Name: [name] MRN: [id number] Date of Birth: Jun 04, 1946 Today's Date: 12/05/2016 Time: SLP Start Time (ACUTE ONLY): 1200-SLP Stop Time (ACUTE ONLY): 1228 SLP Time Calculation (min) (ACUTE ONLY): 28 min Past Medical History: Past Medical History: Diagnosis Date . Arthritis  . Asthma   asthmatic bronchitis . COPD (chronic obstructive pulmonary disease) ([place])  . Dyspnea  . History of kidney stones  . Hyperlipidemia  . Hypertension  . Pneumonia  . PONV (postoperative nausea and vomiting)   none with her hip surgery . Restless leg syndrome  . 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 ABDOMINAL HYSTERECTOMY   . BONE DENSIDEXAY  06/02/2013 . CARDIAC CATHETERIZATION   . COLONOSCOPY  08/05/2015 . CYST EXCISION    from back . CYSTO   . DENTAL EXAMINATION UNDER ANESTHESIA   . DIAGNOSTIC MAMMOGRAM  07/28/2016 . EYE SURGERY Left   cataract surgery with lens implant . LYMPH NODE DISSECTION Right 10/29/2016  Procedure: LYMPH NODE DISSECTION;  Surgeon: [name], MD;  Location: [facility];  Service: Thoracic;  Laterality: Right; . TONSILLECTOMY   . TOTAL HIP ARTHROPLASTY  10/31/2012  Procedure: TOTAL HIP ARTHROPLASTY ANTERIOR APPROACH;  Surgeon: Mcarthur Rossetti, MD;  Location: [facility];  Service: Orthopedics;  Laterality: Right;  Right Total Hip Replacement . VIDEO ASSISTED THORACOSCOPY  (VATS)/WEDGE RESECTION Right 10/29/2016  Procedure: RIGHT  VIDEO ASSISTED THORACOSCOPY /RIGHT UPPER LOBE LUNG RESECTION and Right Thoracotomy;  Surgeon: [name], MD;  Location: [place];  Service: Thoracic;  Laterality: Right; [name] N/A 10/29/2016  Procedure: VIDEO BRONCHOSCOPY;  Surgeon: [name], MD;  Location: [place];  Service: Thoracic;  Laterality: N/A; . VIDEO BRONCHOSCOPY WITH ENDOBRONCHIAL NAVIGATION N/A 08/29/2016  Procedure: VIDEO BRONCHOSCOPY WITH ENDOBRONCHIAL NAVIGATION;  Surgeon: Collene Gobble, MD;  Location: [facility];  Service: Thoracic;  Laterality: N/A; . VIDEO BRONCHOSCOPY WITH INSERTION OF INTERBRONCHIAL VALVE (IBV) N/A 11/05/2016  Procedure: VIDEO BRONCHOSCOPY WITH INSERTION OF INTERBRONCHIAL VALVE (IBV) times three;  Surgeon: [name], MD;  Location: [facility];  Service: Thoracic;  Laterality: N/A; HPI: 71 y.o. 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 Initially was ambulatory in the ICU and out of bed without difficulty. She later had worsening hypoxia and respiratory status prompting initiation of BiPAP support and PCCM consultation.  Course complicated by shock thought to be sepsis from PNA but no positive cultures, AF with RVR, bilateral LE DVT.  Intubated for hypoxic respiratory failure 1/26.   No Data Recorded Assessment / Plan / Recommendation CHL IP CLINICAL IMPRESSIONS 12/05/2016 Clinical Impression MBS complete. Patient with improving swallowing function. Mild dysphagia persists with oral, pharyngeal, and esophageal components. Combination of laryngeal weakness with decreased hyo-laryngeal elevation, epiglottic deflection, and UES relaxation continues to result in episodes of penetration and post swallow  residuals, primarily at UES and along posterior pharynx. Both penetration episodes and  residuals however prevented with use of left head turn and dry swallow, signficantly decreasing aspiration risk. Note that patient with appearance of small osteophytes along C-spine (MD not present to confirm) which now that layrngeal/pharyngeal strength is improving are suspected to be contributing to some degree in decreased passage of bolus through UES. These are chronic in nature and while patient likely compensating well prior to admission, with deconditioning, has been unable to compensate as effectively as she once was. Continue to note build up of solid bolus stasis throughout esophagus post swallow raising concern for post prandial aspiration. Liquid wash assisted to clear. Recommend advancing diet to dysphagia 3, thin liquids with continued use of strict precautions as risk of aspiration related infection continues to be a concern in thish patient with compromised (although improving) pulmonary function.  Discussed with patient and spouse.  SLP Visit Diagnosis Dysphagia, oropharyngeal phase (R13.12) Attention and concentration deficit following -- Frontal lobe and executive function deficit following -- Impact on safety and function Mild aspiration risk;Moderate aspiration risk   CHL IP TREATMENT RECOMMENDATION 12/05/2016 Treatment Recommendations Therapy as outlined in treatment plan below   Prognosis 12/05/2016 Prognosis for Safe Diet Advancement Good Barriers to Reach Goals -- Barriers/Prognosis Comment -- CHL IP DIET RECOMMENDATION 12/05/2016 SLP Diet Recommendations Dysphagia 3 (Mech soft) solids;Thin liquid Liquid Administration via Cup;No straw Medication Administration Crushed with puree Compensations Slow rate;Small sips/bites;Multiple dry swallows after each bite/sip;Follow solids with liquid Postural Changes Seated upright at 90 degrees;Remain semi-upright after after feeds/meals (Comment)   CHL IP OTHER RECOMMENDATIONS 12/05/2016 Recommended Consults Consider esophageal assessment Oral Care  Recommendations Oral care BID Other Recommendations --   CHL IP FOLLOW UP RECOMMENDATIONS 12/05/2016 Follow up Recommendations Inpatient Rehab   CHL IP FREQUENCY AND DURATION 12/05/2016 Speech Therapy Frequency (ACUTE ONLY) min 3x week Treatment Duration 2 weeks      CHL IP ORAL PHASE 12/05/2016 Oral Phase WFL Oral - Pudding Teaspoon -- Oral - Pudding Cup -- Oral - Honey Teaspoon -- Oral - Honey Cup -- Oral - Nectar Teaspoon -- Oral - Nectar Cup -- Oral - Nectar Straw -- Oral - Thin Teaspoon -- Oral - Thin Cup -- Oral - Thin Straw -- Oral - Puree -- Oral - Mech Soft -- Oral - Regular -- Oral - Multi-Consistency -- Oral - Pill -- Oral Phase - Comment --  CHL IP PHARYNGEAL PHASE 12/05/2016 Pharyngeal Phase Impaired Pharyngeal- Pudding Teaspoon -- Pharyngeal -- Pharyngeal- Pudding Cup -- Pharyngeal -- Pharyngeal- Honey Teaspoon NT Pharyngeal -- Pharyngeal- Honey Cup -- Pharyngeal -- Pharyngeal- Nectar Teaspoon Reduced anterior laryngeal mobility;Reduced tongue base retraction;Reduced epiglottic inversion;Pharyngeal residue - posterior pharnyx;Pharyngeal residue - cp segment;Penetration/Aspiration during swallow Pharyngeal Material enters airway, remains ABOVE vocal cords and not ejected out Pharyngeal- Nectar Cup Reduced anterior laryngeal mobility;Reduced epiglottic inversion;Pharyngeal residue - posterior pharnyx;Pharyngeal residue - cp segment;Reduced tongue base retraction Pharyngeal Material does not enter airway Pharyngeal- Nectar Straw -- Pharyngeal -- Pharyngeal- Thin Teaspoon NT Pharyngeal -- Pharyngeal- Thin Cup Reduced anterior laryngeal mobility;Reduced tongue base retraction;Reduced epiglottic inversion;Pharyngeal residue - posterior pharnyx;Pharyngeal residue - cp segment Pharyngeal Material enters airway, remains ABOVE vocal cords and not ejected out Pharyngeal- Thin Straw -- Pharyngeal -- Pharyngeal- Puree Reduced anterior laryngeal mobility;Reduced tongue base retraction;Reduced epiglottic  inversion;Pharyngeal residue - posterior pharnyx;Pharyngeal residue - cp segment;Pharyngeal residue - valleculae Pharyngeal Material does not enter airway Pharyngeal- Mechanical Soft Reduced anterior laryngeal mobility;Reduced tongue base retraction;Reduced epiglottic inversion;Pharyngeal residue -  posterior pharnyx;Pharyngeal residue - cp segment;Pharyngeal residue - valleculae Pharyngeal -- Pharyngeal- Regular -- Pharyngeal -- Pharyngeal- Multi-consistency -- Pharyngeal -- Pharyngeal- Pill Pharyngeal residue - pyriform;Reduced epiglottic inversion;Reduced anterior laryngeal mobility Pharyngeal -- Pharyngeal Comment --  CHL IP CERVICAL ESOPHAGEAL PHASE 12/05/2016 Cervical Esophageal Phase Impaired Pudding Teaspoon -- Pudding Cup -- Honey Teaspoon -- Honey Cup -- Nectar Teaspoon -- Nectar Cup -- Nectar Straw -- Thin Teaspoon -- Thin Cup -- Thin Straw -- Puree -- Mechanical Soft -- Regular -- Multi-consistency -- Pill -- Cervical Esophageal Comment decreased UES relaxation Gabriel Rainwater MA, CCC-SLP [phone] McCoy Leah [name] 12/05/2016, 3:16 PM              Results for orders placed or performed during the hospital encounter of 12/05/16 (from the past 72 hour(s))  CBC WITH DIFFERENTIAL     Status: Abnormal   Collection Time: 12/06/16  4:04 AM  Result Value Ref Range   WBC 9.1 4.0 - 10.5 K/uL   RBC 3.23 (L) 3.87 - 5.11 MIL/uL   Hemoglobin 9.8 (L) 12.0 - 15.0 g/dL   HCT 30.5 (L) 36.0 - 46.0 %   MCV 94.4 78.0 - 100.0 fL   MCH 30.3 26.0 - 34.0 pg   MCHC 32.1 30.0 - 36.0 g/dL   RDW 17.4 (H) 11.5 - 15.5 %   Platelets 242 150 - 400 K/uL   Neutrophils Relative % 59 %   Neutro Abs 5.4 1.7 - 7.7 K/uL   Lymphocytes Relative 26 %   Lymphs Abs 2.4 0.7 - 4.0 K/uL   Monocytes Relative 10 %   Monocytes Absolute 0.9 0.1 - 1.0 K/uL   Eosinophils Relative 4 %   Eosinophils Absolute 0.4 0.0 - 0.7 K/uL   Basophils Relative 1 %   Basophils Absolute 0.1 0.0 - 0.1 K/uL  Comprehensive metabolic panel     Status:  Abnormal   Collection Time: 12/06/16  4:04 AM  Result Value Ref Range   Sodium 137 135 - 145 mmol/L   Potassium 3.9 3.5 - 5.1 mmol/L   Chloride 98 (L) 101 - 111 mmol/L   CO2 30 22 - 32 mmol/L   Glucose, Bld 111 (H) 65 - 99 mg/dL   BUN 13 6 - 20 mg/dL   Creatinine, Ser 1.01 (H) 0.44 - 1.00 mg/dL   Calcium 8.9 8.9 - 10.3 mg/dL   Total Protein 5.8 (L) 6.5 - 8.1 g/dL   Albumin 2.5 (L) 3.5 - 5.0 g/dL   AST 16 15 - 41 U/L   ALT 18 14 - 54 U/L   Alkaline Phosphatase 55 38 - 126 U/L   Total Bilirubin 0.2 (L) 0.3 - 1.2 mg/dL   GFR calc non Af Amer 55 (L) >60 mL/min   GFR calc Af Amer >60 >60 mL/min    Comment: (NOTE) The eGFR has been calculated using the CKD EPI equation. This calculation has not been validated in all clinical situations. eGFR's persistently <60 mL/min signify possible Chronic Kidney Disease.    Anion gap 9 5 - 15  Heparin level (unfractionated)     Status: None   Collection Time: 12/06/16  4:04 AM  Result Value Ref Range   Heparin Unfractionated 0.63 0.30 - 0.70 IU/mL    Comment:        IF HEPARIN RESULTS ARE BELOW EXPECTED VALUES, AND PATIENT DOSAGE HAS BEEN CONFIRMED, SUGGEST FOLLOW UP TESTING OF ANTITHROMBIN III LEVELS.   Protime-INR     Status: Abnormal   Collection Time: 12/06/16  4:04 AM  Result Value Ref Range   Prothrombin Time 19.0 (H) 11.4 - 15.2 seconds   INR 1.58   Protime-INR     Status: Abnormal   Collection Time: 12/07/16  4:27 AM  Result Value Ref Range   Prothrombin Time 23.7 (H) 11.4 - 15.2 seconds   INR 2.08   CBC     Status: Abnormal   Collection Time: 12/07/16  4:27 AM  Result Value Ref Range   WBC 8.6 4.0 - 10.5 K/uL   RBC 3.14 (L) 3.87 - 5.11 MIL/uL   Hemoglobin 9.6 (L) 12.0 - 15.0 g/dL   HCT 29.4 (L) 36.0 - 46.0 %   MCV 93.6 78.0 - 100.0 fL   MCH 30.6 26.0 - 34.0 pg   MCHC 32.7 30.0 - 36.0 g/dL   RDW 17.2 (H) 11.5 - 15.5 %   Platelets 256 150 - 400 K/uL  Heparin level (unfractionated)     Status: None   Collection Time:  12/07/16  4:27 AM  Result Value Ref Range   Heparin Unfractionated 0.65 0.30 - 0.70 IU/mL    Comment:        IF HEPARIN RESULTS ARE BELOW EXPECTED VALUES, AND PATIENT DOSAGE HAS BEEN CONFIRMED, SUGGEST FOLLOW UP TESTING OF ANTITHROMBIN III LEVELS.      HEENT: normal Cardio: RRR Resp: CTA B/L GI: BS positive and NT, ND Extremity:  BS positive and NT, ND Skin:   Other dry skin,  Neuro: Alert/Oriented, Abnormal Sensory reduced in RIght third finger, Abnormal Motor 4/5 in BUE and BLE and Dysarthric Musc/Skel:  Normal and Other no pain with UE or LE ROM Gen NAD Right scapular winging, neg tinel's on R  Assessment/Plan: 1. Functional deficits secondary to debilty which require 3+ hours per day of interdisciplinary therapy in a comprehensive inpatient rehab setting. Physiatrist is providing close team supervision and 24 hour management of active medical problems listed below. Physiatrist and rehab team continue to assess barriers to discharge/monitor patient progress toward functional and medical goals. FIM: Function - Bathing Position: Wheelchair/chair at sink Body parts bathed by patient: Right arm, Left arm, Chest, Abdomen, Front perineal area, Buttocks, Right upper leg, Left upper leg, Right lower leg, Left lower leg Body parts bathed by helper: Back Assist Level: Touching or steadying assistance(Pt > 75%)  Function- Upper Body Dressing/Undressing What is the patient wearing?: Pull over shirt/dress Pull over shirt/dress - Perfomed by patient: Thread/unthread right sleeve, Thread/unthread left sleeve, Put head through opening, Pull shirt over trunk Function - Lower Body Dressing/Undressing What is the patient wearing?: Underwear, Non-skid slipper socks, Pants Position: Wheelchair/chair at sink Underwear - Performed by patient: Thread/unthread right underwear leg, Thread/unthread left underwear leg, Pull underwear up/down Pants- Performed by patient: Thread/unthread right pants  leg, Thread/unthread left pants leg, Pull pants up/down Non-skid slipper socks- Performed by patient: Don/doff right sock, Don/doff left sock Assist for lower body dressing: Touching or steadying assistance (Pt > 75%)  Function - Toileting Toileting steps completed by patient: Adjust clothing prior to toileting, Performs perineal hygiene, Adjust clothing after toileting Toileting Assistive Devices: Grab bar or rail Assist level: Set up/obtain supplies     Function - Chair/bed transfer Chair/bed transfer assist level: Touching or steadying assistance (Pt > 75%)  Function - Locomotion: Wheelchair Will patient use wheelchair at discharge?: No Wheelchair activity did not occur: N/A Wheel 50 feet with 2 turns activity did not occur: N/A Wheel 150 feet activity did not occur: N/A Function - Locomotion: Ambulation Assistive device:  No device Max distance: 147f  Assist level: Touching or steadying assistance (Pt > 75%) Assist level: Touching or steadying assistance (Pt > 75%) Assist level: Touching or steadying assistance (Pt > 75%) Assist level: Touching or steadying assistance (Pt > 75%)  Function - Comprehension Comprehension: Auditory Comprehension assist level: Follows complex conversation/direction with extra time/assistive device  Function - Expression Expression: Verbal Expression assist level: Expresses complex 90% of the time/cues < 10% of the time  Function - Social Interaction Social Interaction assist level: Interacts appropriately 90% of the time - Needs monitoring or encouragement for participation or interaction.  Function - Problem Solving Problem solving assist level: Solves complex 90% of the time/cues < 10% of the time  Function - Memory Memory assist level: More than reasonable amount of time Patient normally able to recall (first 3 days only): Current season, Location of own room, Staff names and faces, That he or she is in a hospital  Medical Problem List  and Plan: 1.  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  DVT Prophylaxis/Anticoagulation: DVT peroneal veins of the right and left lower extremities.. Heparin/Coumadin therapy. Monitor for any bleeding episodes, INR 2.08 today, may overlap another day to make sure INR >2 in am 3 . Pain Management: oxycodone as needed 4. Mood: provide emotional support 5. Neuropsych: This patient is capable of making decisions on her own behalf. 6. Skin/Wound Care: routine skin checks 7. Fluids/Electrolytes/Nutrition: routine I&O with follow-up chemistries 8. Acute on chronic anemia. Follow-up CBC, hgb 9.8 vs 9.5 on 2/21 9. Atrial fibrillation. Amiodarone 200 mg twice a day. Cardiac rate controlled, heparin drip Vitals:   12/06/16 2049 12/07/16 0402  BP: (!) 152/80 (!) 144/77  Pulse: 85 88  Resp:  18  Temp:  97.6 F (36.4 C)   10. Dysphagia. Dysphagia #3 thin liquids. Follow-up speech therapy 11. Decreased nutritional storage. Calorie counts 12.  SOB with normal sats, improves with relaxation, will ask Neuropsych to train relaxation techniques, no benzos for now to avoid sedation LOS (Days) 2  A FACE TO FACE EVALUATION WAS PERFORMED  KIRSTEINS,ANDREW E 12/07/2016, 7:27 AM

## 2016-12-07 NOTE — IPOC Note (Signed)
Overall Plan of Care 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[place])     Functional Problem List: Nursing Pain, Nutrition, Safety, Skin Integrity  PT Balance, Endurance, Safety  OT Balance, Endurance, Safety  SLP    TR         Basic ADL's: OT Grooming, Bathing, Dressing, Toileting     Advanced  ADL's: OT Simple Meal Preparation, Light Housekeeping     Transfers: PT Bed Mobility, Bed to Chair, Car, [employer], Floor  OT Tub/Shower, Agricultural engineer: PT Ambulation, Emergency planning/management officer, Stairs     Additional Impairments: OT Fuctional Use of Upper Extremity  SLP Swallowing      TR      Anticipated Outcomes Item Anticipated Outcome  Self Feeding independent  Swallowing  Mod I with least restrictive diet    Basic self-care  modified independent  Toileting  modified independent   Bathroom Transfers modified independent  Bowel/Bladder  Continent to bowel and bladder with min. assisst.  Transfers  mod I with LRAD   Locomotion  Mod I with LRAD   Communication     Cognition     Pain  Less than 3 on 1 to 10 scale.  Safety/Judgment  Free from falls during her stay in rehab   Therapy Plan: PT Intensity: Minimum of 1-2 x/day ,45 to 90 minutes PT Frequency: 5 out of 7 days PT Duration Estimated Length of Stay: 5-7 days  OT Intensity: Minimum of 1-2 x/day, 45 to 90 minutes OT Frequency: 5 out of 7 days OT Duration/Estimated Length of Stay: 5-7 days SLP Intensity: Minumum of 1-2 x/day, 30 to 90 minutes SLP Frequency: 3 to 5 out of 7 days SLP Duration/Estimated Length of Stay: 5-7 days        Team Interventions: Nursing Interventions Patient/Family Education, Disease Management/Prevention, Skin Care/Wound Management  PT interventions Ambulation/gait training, Training and development officer, Community  reintegration, Discharge planning, Disease management/prevention, DME/adaptive equipment instruction, Functional mobility training, Neuromuscular re-education, Pain management, Patient/family education, Psychosocial support, Skin care/wound management, Stair training, Therapeutic Activities, Therapeutic Exercise, UE/LE Strength taining/ROM, UE/LE Coordination activities, Visual/perceptual remediation/compensation, Wheelchair propulsion/positioning  OT Interventions Training and development officer, Academic librarian, Discharge planning, Patient/family education, Functional mobility training, Neuromuscular re-education, DME/adaptive equipment instruction, UE/LE Strength taining/ROM, Therapeutic Exercise, Therapeutic Activities, Self Care/advanced ADL retraining, UE/LE Coordination activities  SLP Interventions Patient/family education, Therapeutic Activities, Therapeutic Exercise, Dysphagia/aspiration precaution training  TR Interventions    SW/CM Interventions Discharge Planning, Psychosocial Support, Patient/Family Education    Team Discharge Planning: Destination: PT-Home ,OT- Home , SLP-Home Projected Follow-up: PT-Home health PT, OT-  Outpatient OT, SLP-Outpatient SLP (TBD) Projected Equipment Needs: PT-To be determined, OT- To be determined, SLP-None recommended by SLP Equipment Details: PT-has RW , OT-  Patient/family involved in discharge planning: PT- Patient,  OT-Patient, Family member/caregiver, SLP-Patient  MD ELOS: 5-7 Medical Rehab Prognosis:  Excellent Assessment: The patient has been admitted for CIR therapies with the diagnosis of critical illness myopathy. The team will be addressing functional mobility, strength, stamina, balance, safety, adaptive techniques and equipment, self-care, bowel and bladder mgt, patient and caregiver education, NMR, pain control, community reintegration. Goals have been set at mod I for mobility and self-care.    Meredith Staggers, MD,  FAAPMR      See Team Conference Notes for weekly updates to the plan of care

## 2016-12-07 NOTE — Progress Notes (Signed)
Physical Therapy Session Note  Patient Details  Name: [name] MRN: [id number] Date of Birth: Apr 15, 1946  Today's Date: 12/07/2016 PT Individual Time: 0902-1000 AND 1419-1446 PT Individual Time Calculation (min): 58 min AND 27 min   Short Term Goals: Week 1:  PT Short Term Goal 1 (Week 1): STG=LTG due to ELOS   Skilled Therapeutic Interventions/Progress Updates:   Pt received sitting EOB and agreeable to PT.   PT instructed patient in gait through hall in hospital 2x 259f with RW and supervision assist from PT for safety. Gait without AD 2x1064fwith supervision assist from PT with min cues for increased BOS to prevent lateral LOB.   PT instructed pt in DGI: 16. ( <19 indicated increased fall risk). See below for detailed results.   Standing balance training instructed by PT with min progressing to supervision assist to stand on wedge (3 and 6 inch) while playing connect four 2 bouts at each height x 5 minutes each. Alternating toe taps on 3 inch step x 12 BLE with 1 UE support and x 10 BLE without UE support. PT provided min assist throughout to prevent LOB and improve safety; patient noted to have 2 near LOB and appropriately demonstrated hip strategy to prevent LOB.     Patient returned to room and left sitting in recliner with call bell in reach and all needs met.     Session 2 Pt received sitting in recliner and agreeable to PT  Gait to and from rehab gym with RW and supervision assist from PT for [id number] 2  Nustep x 5 minutes, level 2>3, with one prolonged rest break due to fatigue and shortness of breath cues to maintain steps per minute less than 50 to prevent excessive fatigue. .   Seated therex with level 2 tband: hip abduction x 12 BLE, reciprocal marching x 12 BLE, and SAQ x 10 BLE. Min cues for set up and proper ROM throughout therex.   Patient returned to room and left sitting in recliner with call bell in reach and all needs met.           Therapy  Documentation Precautions:  Precautions Precautions: Fall Restrictions Weight Bearing Restrictions: No    Vital Signs: Therapy Vitals Pulse Rate: 86 Resp: 18 Oxygen Therapy O2 Device: Not Delivered Pain:   0/10 at rest Balance: Standardized Balance Assessment Standardized Balance Assessment: Dynamic Gait Index Dynamic Gait Index Level Surface: Normal Change in Gait Speed: Mild Impairment Gait with Horizontal Head Turns: Mild Impairment Gait with Vertical Head Turns: Moderate Impairment Gait and Pivot Turn: Mild Impairment Step Over Obstacle: Mild Impairment Step Around Obstacles: Mild Impairment Steps: Mild Impairment Total Score: 16   See Function Navigator for Current Functional Status.   Therapy/Group: Individual Therapy  AusLorie Phenix23/2018, 10:03 AM

## 2016-12-08 ENCOUNTER — Inpatient Hospital Stay (HOSPITAL_COMMUNITY): Payer: Medicare Other | Admitting: Occupational Therapy

## 2016-12-08 ENCOUNTER — Inpatient Hospital Stay (HOSPITAL_COMMUNITY): Payer: Medicare Other | Admitting: Speech Pathology

## 2016-12-08 ENCOUNTER — Inpatient Hospital Stay (HOSPITAL_COMMUNITY): Payer: Medicare Other

## 2016-12-08 ENCOUNTER — Inpatient Hospital Stay (HOSPITAL_COMMUNITY): Payer: Medicare Other | Admitting: Physical Therapy

## 2016-12-08 DIAGNOSIS — G6281 Critical illness polyneuropathy: Secondary | ICD-10-CM

## 2016-12-08 LAB — CBC
HCT: 29.7 % — ABNORMAL LOW (ref 36.0–46.0)
Hemoglobin: 9.7 g/dL — ABNORMAL LOW (ref 12.0–15.0)
MCH: 30.5 pg (ref 26.0–34.0)
MCHC: 32.7 g/dL (ref 30.0–36.0)
MCV: 93.4 fL (ref 78.0–100.0)
PLATELETS: 263 10*3/uL (ref 150–400)
RBC: 3.18 MIL/uL — ABNORMAL LOW (ref 3.87–5.11)
RDW: 17.1 % — AB (ref 11.5–15.5)
WBC: 8.7 10*3/uL (ref 4.0–10.5)

## 2016-12-08 LAB — PROTIME-INR
INR: 2.47
PROTHROMBIN TIME: 27.2 s — AB (ref 11.4–15.2)

## 2016-12-08 LAB — TROPONIN I: Troponin I: <0.03 ng/mL (ref <0.03)

## 2016-12-08 LAB — HEPARIN LEVEL (UNFRACTIONATED): HEPARIN UNFRACTIONATED: 0.1 [IU]/mL — AB (ref 0.30–0.70)

## 2016-12-08 MED ORDER — WARFARIN SODIUM 2 MG PO TABS
2.0000 mg | ORAL_TABLET | Freq: Once | ORAL | Status: AC
  Administered 2016-12-08: 2 mg via ORAL
  Filled 2016-12-08: qty 1

## 2016-12-08 NOTE — Progress Notes (Signed)
Occupational Therapy Session Note  Patient Details  Name: [name] MRN: [id number] Date of Birth: 03-14-1946  Today's Date: 12/08/2016 OT Individual Time: [phone] and [id number] OT Individual Time Calculation (min): 56 min and 29 min    Short Term Goals: Week 1:  OT Short Term Goal 1 (Week 1): STGs equal to LTGs set at modified independent level based on ELOS.  Skilled Therapeutic Interventions/Progress Updates:     Session 1: Upon entering the room, pt supine in bed with husband present in room. Pt with c/o nausea this session and SOB. O2 levels checked above 95% throughout session. Pt continues to be agreeable for OT intervention and requesting to shower this session. Pt obtained clothing items with use of RW and supervision. She ambulated to bathroom for toileting needs with supervision for balance during clothing management. Pt needing min verbal cues to doff clothing from seated position for safety. Pt bathing from shower level with overall supervision level for safety. Pt dressed self while seated on commode chair to simulate what she plans on doing in home environment. Pt seated in recliner chair at end of session to eat breakfast. Pt reports increased nausea and RN notified. Call bell and all needed items within reach.   Session 2: Upon entering the room, pt supine in bed with continued c/o nausea and declined OOB tasks this session. OT provided pt with paper handouts regarding energy conservation for ADLs and IADL tasks. OT described current techniques utilized in therapy sessions for safety and energy conservation as well. Pt asking questions as appropriate. Education to continued. Pt remained in bed at end of session. Call bell and all needed items within reach.    Therapy Documentation Precautions:  Precautions Precautions: Fall Restrictions Weight Bearing Restrictions: No General:   Vital Signs: Therapy Vitals Temp: 98 F (36.7 C) Temp Source: Oral Pulse Rate:  73 Resp: 17 BP: (!) 156/83 Patient Position (if appropriate): Lying Oxygen Therapy SpO2: 98 % O2 Device: Nasal Cannula O2 Flow Rate (L/min): 1 L/min  See Function Navigator for Current Functional Status.   Therapy/Group: Individual Therapy  Gypsy Decant 12/08/2016, 8:59 AM

## 2016-12-08 NOTE — Progress Notes (Signed)
[address]       [place] [address]             [phone]                     LOS: 3 days   Subjective: Says she was more sob during night, was able to do rehab today   Objective: Vital signs in last 24 hours: Patient Vitals for the past 24 hrs:  BP Temp Temp src Pulse Resp SpO2 Weight  12/08/16 1425 - - - - - 96 % -  12/08/16 0723 - - - - - 98 % -  12/08/16 0650 - - - - - 100 % -  12/08/16 0542 (!) 156/83 98 F (36.7 C) Oral 73 17 94 % -  12/08/16 0512 - - - - - - 126 lb 8 oz (57.4 kg)  12/08/16 0030 - - - - - 97 % -  12/07/16 2035 (!) 153/72 - - 78 - - -  12/07/16 2008 - - - - - 95 % -  12/07/16 1511 - - - 85 18 95 % -    Filed Weights   12/06/16 0435 12/07/16 0402 12/08/16 0512  Weight: 128 lb (58.1 kg) 127 lb 10.3 oz (57.9 kg) 126 lb 8 oz (57.4 kg)    Hemodynamic parameters for last 24 hours:    Intake/Output from previous day: 02/23 0701 - 02/24 0700 In: 1190 [P.O.:1190] Out: -  Intake/Output this shift: Total I/O In: 360 [P.O.:360] Out: -   Scheduled Meds: . amiodarone  200 mg Oral BID  . feeding supplement  1 Container Oral TID BM  . Lucylle Foulkes's butt cream   Topical BID  . ipratropium  0.5 mg Nebulization TID  . levalbuterol  0.63 mg Nebulization TID  . pantoprazole sodium  40 mg Oral Q1200  . warfarin  2 mg Oral ONCE-1800  . Warfarin - Pharmacist Dosing Inpatient   Does not apply q1800   Continuous Infusions: PRN Meds:.ondansetron **OR** ondansetron (ZOFRAN) IV, oxyCODONE, sorbitol  General appearance: alert, cooperative and appears stated age No wheezing, wounds intact   Lab Results: CBC: Recent Labs  12/07/16 0427 12/08/16 0503  WBC 8.6 8.7  HGB 9.6* 9.7*  HCT 29.4* 29.7*  PLT 256 263   BMET:  Recent Labs  12/06/16 0404  NA 137  K 3.9  CL 98*  CO2 30  GLUCOSE 111*  BUN 13  CREATININE 1.01*  CALCIUM 8.9    PT/INR:  Recent Labs  12/08/16 0503  LABPROT 27.2*  INR 2.47   Lab Results    Component Value Date   TROPONINI <0.03 12/08/2016      Radiology Dg Chest 2 View  Result Date: 12/08/2016 CLINICAL DATA:  Shortness of breath for 1 day. History of right lobectomy and endobronchial valve replacement. EXAM: CHEST  2 VIEW COMPARISON:  12/04/2016 and prior exams FINDINGS: The cardiomediastinal silhouette is unchanged. Postoperative changes and volume loss in the right hemithorax again noted. A right endobronchial valve is again identified. Streaky right lower lung opacity is unchanged. No airspace disease, pleural effusion or pneumothorax noted. No acute bony abnormalities are present. IMPRESSION: Unchanged appearance of the chest as described. Electronically Signed   By: Margarette Canada M.D.   On: 12/08/2016 11:03     Assessment/Plan: Right upper lobectomy with underlying copd, post op course complicated by pneumonia and sepsis, Afib and dvt.  Cancer Staging Malignant neoplasm of upper  lobe of right lung [facility]) Staging form: Lung, AJCC 8th Edition - Pathologic stage from 10/23/2016: Stage IIA (pT2b, pN0, cM0) - Signed by [name], MD on 10/30/2016  Complaint of sob during night , currently appears at base line , chest xray is improved from last week with better aeration troponin negative    [name] MD 12/08/2016 2:50 PM     Patient ID: [name], female   DOB: 06-06-1946, 71 y.o.   MRN: [id number]

## 2016-12-08 NOTE — Progress Notes (Signed)
   Subjective/Complaints: stil with SOB today She says "this feels different" Also some dizziness- states room is spinning (not associated with vertical posisiton). sxs worse with moving head.  Objective: Vital Signs: Blood pressure (!) 156/83, pulse 73, temperature 98 F (36.7 C), temperature source Oral, resp. rate 17, height 5' 3.5" (1.613 m), weight 126 lb 8 oz (57.4 kg), SpO2 98 %.  nad Chest- cta cv- reg rate abd- soft, + BS Ext- no edema HEENT- jvd- nl, no nystagmus   Assessment/Plan: 1. Functional deficits secondary to debilty   Medical Problem List and Plan: 1.  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  DVT Prophylaxis/Anticoagulation: DVT peroneal veins of the right and left lower extremities.. Warfarin therapy 3 . Pain Management: oxycodone as needed 4. Mood: provide emotional support 5. Neuropsych: This patient is capable of making decisions on her own behalf. 6. Skin/Wound Care: routine skin checks 7. Fluids/Electrolytes/Nutrition: routine I&O with follow-up chemistries 8. Acute on chronic anemia. hgb stable 9. Atrial fibrillation. Amiodarone 200 mg twice a day. warfarin Vitals:   12/07/16 2035 12/08/16 0542  BP: (!) 153/72 (!) 156/83  Pulse: 78 73  Resp:  17  Temp:  98 F (36.7 C)   10. Dysphagia. Dysphagia #3 thin liquids. Follow-up speech therapy 11. Decreased nutritional storage. Calorie counts 12.  SOB with normal sats,  Given that she states "this is different" I'll order a cxr, cycle enzymes, and order ekg.  LOS (Days) 3  A FACE TO FACE EVALUATION WAS PERFORMED  Yachet Mattson H Severina Sykora 12/08/2016, 7:50 AM

## 2016-12-08 NOTE — Progress Notes (Signed)
Speech Language Pathology Daily Session Note  Patient Details  Name: [name] MRN: [id number] Date of Birth: 03-Feb-1946  Today's Date: 12/08/2016 SLP Individual Time: 1300-1330 SLP Individual Time Calculation (min): 30 min  Short Term Goals: Week 1: SLP Short Term Goal 1 (Week 1): STGs=LTGs  Skilled Therapeutic Interventions: skilled treatment session focused on dysphagia goals. Pt reports that she isn't feeling well but able to demonstrate pharyngeal strengthening exercises x 1. Pt reports that she hasn't been practicing d/t overall fatigue and nausea. Pt with questions regarding compensatory swallow strategies and if she would need to use strategies at time of discharge. SLP answers all questions to pt and husband satisfaction. Continue to recommend Outpatient ST services at time of discharge. Pt is agreeable. Pt left upright in bed with all needs within reach. Continue per current plan of care.      Function:  Eating Eating   Modified Consistency Diet: No Eating Assist Level: Swallowing techniques: self managed           Cognition Comprehension Comprehension assist level: Follows complex conversation/direction with extra time/assistive device  Expression   Expression assist level: Expresses complex 90% of the time/cues < 10% of the time  Social Interaction Social Interaction assist level: Interacts appropriately 90% of the time - Needs monitoring or encouragement for participation or interaction.  Problem Solving Problem solving assist level: Solves complex 90% of the time/cues < 10% of the time  Memory Memory assist level: Recognizes or recalls 90% of the time/requires cueing < 10% of the time    Pain    Therapy/Group: Individual Therapy   Eljay Lave B. Rutherford Nail, M.S., CCC-SLP Speech-Language Pathologist   Jowana Thumma Rutherford Nail 12/08/2016, 2:32 PM

## 2016-12-08 NOTE — Progress Notes (Signed)
ANTICOAGULATION CONSULT NOTE - FOLLOW UP  Pharmacy Consult for:  warfarin Indication: DVT and atrial fibrillation  Allergies  Allergen Reactions  . Nickel Rash    Patient Measurements: Height: 5' 3.5" (161.3 cm) Weight: 126 lb 8 oz (57.4 kg) IBW/kg (Calculated) : 53.55 Heparin Dosing Weight: 64.4 kg  Vital Signs: Temp: 98 F (36.7 C) (02/24 0542) Temp Source: Oral (02/24 0542) BP: 156/83 (02/24 0542) Pulse Rate: 73 (02/24 0542)  Labs:  Recent Labs  12/06/16 0404 12/07/16 0427 12/08/16 0503 12/08/16 0916 12/08/16 1207  HGB 9.8* 9.6* 9.7*  --   --   HCT 30.5* 29.4* 29.7*  --   --   PLT 242 256 263  --   --   LABPROT 19.0* 23.7* 27.2*  --   --   INR 1.58 2.08 2.47  --   --   HEPARINUNFRC 0.63 0.65 0.10*  --   --   CREATININE 1.01*  --   --   --   --   TROPONINI  --   --   --  <0.03 <0.03    Estimated Creatinine Clearance: 43.2 mL/min (by C-G formula based on SCr of 1.01 mg/dL (H)).  Medications: Heparin @ 1100 units/hr  Assessment: 63 yof continues on heparin for new bilateral peroneal DVTs and afib.CBC stable; no bleeding documented. Warfarin started 2/16 per MD. INR therapeutic at 2.47. Pt with good PO intake,  On amiodarone but has been on since PTA.   Goal of Therapy:  INR 2-3 Monitor platelets by anticoagulation protocol: Yes   Plan:  2. Warfarin '2mg'$  today x1 3. Monitor for signs and symptoms of bleeding, DDI, PO intake 4. Daily CBC and INR  Thank you for allowing pharmacy to be a part of this patient's care.  Carlean Jews, Pharm.D. PGY1 Pharmacy Resident 2/24/20182:20 PM Pager [phone]

## 2016-12-08 NOTE — Progress Notes (Signed)
Physical Therapy Session Note  Patient Details  Name: SHANICE POZNANSKI MRN: [id number] Date of Birth: 1946-01-14  Today's Date: 12/08/2016 PT Individual Time: 6195-0932 PT Individual Time Calculation (min): 66 min   Short Term Goals: Week 1:  PT Short Term Goal 1 (Week 1): STG=LTG due to ELOS   Skilled Therapeutic Interventions/Progress Updates: Pt presented in bed agreeable to therapy. Pt indicating feeling increased SOB today and nausea. Pt ambulated with RW 232f with supervision demonstrating good safety. NuStep L2 x5 min with rest break 2/2 fatigue. SpO2 93%/HR 88 after NuStep. Participated in standing balance activities reaching hig/low outside BOS on compliant/non-compliant surfaces. Performed toe taps no UE support with x1 LOB which pt was able to self correct. Pt required rest breaks between bouts due to fatigue and increasing nausea. Sit to/from stand 2x5 with no UE for improved safety and LE strengthening able to perform with min guard and no compensation of bracing against table. Pt ambulated throughout unit for increased endurance; 1864fto day room with no rest breaks and [id number] to room with x1 seated rest due to increased nausea. Pt returned to bed but sitting at EOB per pt request. Pt left in room at EOB and husband present with nsg notified of increased nausea.      Therapy Documentation Precautions:  Precautions Precautions: Fall Restrictions Weight Bearing Restrictions: No  See Function Navigator for Current Functional Status.   Therapy/Group: Individual Therapy  Seva Chancy  Emarion Toral, PTA  12/08/2016, 12:41 PM

## 2016-12-09 LAB — PROTIME-INR
INR: 3.15
Prothrombin Time: 33 seconds — ABNORMAL HIGH (ref 11.4–15.2)

## 2016-12-09 NOTE — Progress Notes (Signed)
ANTICOAGULATION CONSULT NOTE - FOLLOW UP  Pharmacy Consult for:  warfarin Indication: DVT and atrial fibrillation  Allergies  Allergen Reactions  . Nickel Rash    Patient Measurements: Height: 5' 3.5" (161.3 cm) Weight: 128 lb 6.4 oz (58.2 kg) IBW/kg (Calculated) : 53.55 Heparin Dosing Weight: 64.4 kg  Vital Signs: Temp: 98.9 F (37.2 C) (02/25 0401) Temp Source: Oral (02/25 0401) BP: 148/76 (02/25 0401) Pulse Rate: 74 (02/25 0401)  Labs:  Recent Labs  12/07/16 0427 12/08/16 0503 12/08/16 0916 12/08/16 1207 12/08/16 1533 12/09/16 0350  HGB 9.6* 9.7*  --   --   --   --   HCT 29.4* 29.7*  --   --   --   --   PLT 256 263  --   --   --   --   LABPROT 23.7* 27.2*  --   --   --  33.0*  INR 2.08 2.47  --   --   --  3.15  HEPARINUNFRC 0.65 0.10*  --   --   --   --   TROPONINI  --   --  <0.03 <0.03 <0.03  --     Estimated Creatinine Clearance: 43.2 mL/min (by C-G formula based on SCr of 1.01 mg/dL (H)). Assessment: 15 yof continues on heparin for new bilateral peroneal DVTs and afib.CBC stable; no bleeding documented. Warfarin started 2/16 per MD. INR supratherapeutic today at 3.15. Pt with moderate PO intake,  On amiodarone but has been on since PTA. No bleeding noted. No new DDI  Goal of Therapy:  INR 2-3 Monitor platelets by anticoagulation protocol: Yes   Plan:  1. Hold warfarin tonight, consider resuming tomorrow 2. Monitor for signs and symptoms of bleeding, DDI, PO intake 3. Daily CBC and INR  Thank you for allowing pharmacy to be a part of this patient's care.  Carlean Jews, Pharm.D. PGY1 Pharmacy Resident 2/25/20182:01 PM Pager [phone]

## 2016-12-09 NOTE — Progress Notes (Signed)
   Subjective/Complaints: Feels much better today. Shortness of breath has resolved. She is no longer dizzy. She was able to participate in therapy yesterday. Objective: Vital Signs: Blood pressure (!) 148/76, pulse 74, temperature 98.9 F (37.2 C), temperature source Oral, resp. rate 17, height 5' 3.5" (1.613 m), weight 128 lb 6.4 oz (58.2 kg), SpO2 99 %.  nad Chest- cta cv- reg rate abd- soft, + BS Ext- no edema HEENT- jvd- nl, no nystagmus   Assessment/Plan: 1. Functional deficits secondary to debilty   Medical Problem List and Plan: 1.  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  DVT Prophylaxis/Anticoagulation: DVT peroneal veins of the right and left lower extremities.. Warfarin therapy 3 . Pain Management: controlled 4. Mood: provide emotional support 5. Neuropsych: This patient is capable of making decisions on her own behalf. 6. Skin/Wound Care: routine skin checks 7. Fluids/Electrolytes/Nutrition: routine I&O with follow-up chemistries Basic Metabolic Panel:    Component Value Date/Time   NA 137 12/06/2016 0404   NA 141 09/11/2016 1324   K 3.9 12/06/2016 0404   K 3.9 09/11/2016 1324   CL 98 (L) 12/06/2016 0404   CO2 30 12/06/2016 0404   CO2 30 (H) 09/11/2016 1324   BUN 13 12/06/2016 0404   BUN 19.7 09/11/2016 1324   CREATININE 1.01 (H) 12/06/2016 0404   CREATININE 0.9 09/11/2016 1324   GLUCOSE 111 (H) 12/06/2016 0404   GLUCOSE 106 09/11/2016 1324   CALCIUM 8.9 12/06/2016 0404   CALCIUM 10.1 09/11/2016 1324   8. Acute on chronic anemia. hgb stable 9. Atrial fibrillation. Amiodarone 200 mg twice a day. Warfarin 124/66-148/76 10. Dysphagia. Dysphagia #3 thin liquids. Follow-up speech therapy 11. Decreased nutritional storage. Calorie counts 12.  SOB resolved Troponin neg,  cxr stable/improved  LOS (Days) 4  A FACE TO FACE EVALUATION WAS PERFORMED  Leaha Cuervo H Reyne Falconi 12/09/2016, 8:35 AM

## 2016-12-10 ENCOUNTER — Inpatient Hospital Stay (HOSPITAL_COMMUNITY): Payer: Medicare Other | Admitting: Occupational Therapy

## 2016-12-10 ENCOUNTER — Inpatient Hospital Stay (HOSPITAL_COMMUNITY): Payer: Medicare Other | Admitting: Speech Pathology

## 2016-12-10 ENCOUNTER — Inpatient Hospital Stay (HOSPITAL_COMMUNITY): Payer: Medicare Other

## 2016-12-10 DIAGNOSIS — K5900 Constipation, unspecified: Secondary | ICD-10-CM

## 2016-12-10 LAB — PROTIME-INR
INR: 2.54
Prothrombin Time: 27.8 seconds — ABNORMAL HIGH (ref 11.4–15.2)

## 2016-12-10 MED ORDER — SENNOSIDES-DOCUSATE SODIUM 8.6-50 MG PO TABS
2.0000 | ORAL_TABLET | Freq: Two times a day (BID) | ORAL | Status: DC
  Administered 2016-12-10 – 2016-12-11 (×3): 2 via ORAL
  Filled 2016-12-10 (×5): qty 2

## 2016-12-10 MED ORDER — LEVALBUTEROL HCL 0.63 MG/3ML IN NEBU
0.6300 mg | INHALATION_SOLUTION | RESPIRATORY_TRACT | Status: DC | PRN
  Administered 2016-12-11 (×2): 0.63 mg via RESPIRATORY_TRACT
  Filled 2016-12-10 (×3): qty 3

## 2016-12-10 MED ORDER — PRO-STAT SUGAR FREE PO LIQD
30.0000 mL | Freq: Two times a day (BID) | ORAL | Status: DC
  Administered 2016-12-10 – 2016-12-12 (×4): 30 mL via ORAL
  Filled 2016-12-10 (×4): qty 30

## 2016-12-10 MED ORDER — WARFARIN SODIUM 2 MG PO TABS
2.0000 mg | ORAL_TABLET | Freq: Once | ORAL | Status: AC
  Administered 2016-12-10: 2 mg via ORAL
  Filled 2016-12-10: qty 1

## 2016-12-10 NOTE — Progress Notes (Signed)
Social Work Patient ID: [name], female   DOB: Sep 14, 1946, 71 y.o.   MRN: [id number]  Team feels pt will be ready for discharge on Wed 2/28. MD is aware and agreeable to this. Pt and husband aware of the plans and feel comfortable with this.

## 2016-12-10 NOTE — Progress Notes (Signed)
Subjective/Complaints: Constipated no BM x 3 d No abd pain ROS  No N/V/D, no leg pain,  Objective: Vital Signs: Blood pressure (!) 144/64, pulse 67, temperature 98.2 F (36.8 C), temperature source Oral, resp. rate 17, height 5' 3.5" (1.613 m), weight 59.6 kg (131 lb 6.3 oz), SpO2 100 %. Dg Chest 2 View  Result Date: 12/08/2016 CLINICAL DATA:  Shortness of breath for 1 day. History of right lobectomy and endobronchial valve replacement. EXAM: CHEST  2 VIEW COMPARISON:  12/04/2016 and prior exams FINDINGS: The cardiomediastinal silhouette is unchanged. Postoperative changes and volume loss in the right hemithorax again noted. A right endobronchial valve is again identified. Streaky right lower lung opacity is unchanged. No airspace disease, pleural effusion or pneumothorax noted. No acute bony abnormalities are present. IMPRESSION: Unchanged appearance of the chest as described. Electronically Signed   By: Margarette Canada M.D.   On: 12/08/2016 11:03   Results for orders placed or performed during the hospital encounter of 12/05/16 (from the past 72 hour(s))  Heparin level (unfractionated)     Status: Abnormal   Collection Time: 12/08/16  5:03 AM  Result Value Ref Range   Heparin Unfractionated 0.10 (L) 0.30 - 0.70 IU/mL    Comment:        IF HEPARIN RESULTS ARE BELOW EXPECTED VALUES, AND PATIENT DOSAGE HAS BEEN CONFIRMED, SUGGEST FOLLOW UP TESTING OF ANTITHROMBIN III LEVELS.   Protime-INR     Status: Abnormal   Collection Time: 12/08/16  5:03 AM  Result Value Ref Range   Prothrombin Time 27.2 (H) 11.4 - 15.2 seconds   INR 2.47   CBC     Status: Abnormal   Collection Time: 12/08/16  5:03 AM  Result Value Ref Range   WBC 8.7 4.0 - 10.5 K/uL   RBC 3.18 (L) 3.87 - 5.11 MIL/uL   Hemoglobin 9.7 (L) 12.0 - 15.0 g/dL   HCT 29.7 (L) 36.0 - 46.0 %   MCV 93.4 78.0 - 100.0 fL   MCH 30.5 26.0 - 34.0 pg   MCHC 32.7 30.0 - 36.0 g/dL   RDW 17.1 (H) 11.5 - 15.5 %   Platelets 263 150 - 400 K/uL   Troponin I     Status: None   Collection Time: 12/08/16  9:16 AM  Result Value Ref Range   Troponin I <0.03 <0.03 ng/mL  Troponin I     Status: None   Collection Time: 12/08/16 12:07 PM  Result Value Ref Range   Troponin I <0.03 <0.03 ng/mL  Troponin I     Status: None   Collection Time: 12/08/16  3:33 PM  Result Value Ref Range   Troponin I <0.03 <0.03 ng/mL  Protime-INR     Status: Abnormal   Collection Time: 12/09/16  3:50 AM  Result Value Ref Range   Prothrombin Time 33.0 (H) 11.4 - 15.2 seconds   INR 3.15   Protime-INR     Status: Abnormal   Collection Time: 12/10/16  3:58 AM  Result Value Ref Range   Prothrombin Time 27.8 (H) 11.4 - 15.2 seconds   INR 2.54      HEENT: normal Cardio: RRR Resp: CTA B/L GI: BS positive and NT, ND Extremity:  BS positive and NT, ND Skin:   Other dry skin,  Neuro: Alert/Oriented, Abnormal Sensory reduced in RIght third finger, Abnormal Motor 4/5 in BUE and BLE and Dysarthric Musc/Skel:  Normal and Other no pain with UE or LE ROM Gen NAD Right scapular winging,  neg tinel's on R  Assessment/Plan: 1. Functional deficits secondary to debilty which require 3+ hours per day of interdisciplinary therapy in a comprehensive inpatient rehab setting. Physiatrist is providing close team supervision and 24 hour management of active medical problems listed below. Physiatrist and rehab team continue to assess barriers to discharge/monitor patient progress toward functional and medical goals. FIM: Function - Bathing Position: Shower Body parts bathed by patient: Right arm, Left arm, Chest, Front perineal area, Buttocks, Right upper leg, Left upper leg, Right lower leg, Left lower leg, Abdomen Body parts bathed by helper: Back Assist Level: Supervision or verbal cues  Function- Upper Body Dressing/Undressing What is the patient wearing?: Pull over shirt/dress Pull over shirt/dress - Perfomed by patient: Thread/unthread right sleeve, Thread/unthread  left sleeve, Put head through opening, Pull shirt over trunk Assist Level: Supervision or verbal cues Function - Lower Body Dressing/Undressing What is the patient wearing?: Underwear, Pants, Shoes Position: Other (comment) (seated on commode) Underwear - Performed by patient: Thread/unthread right underwear leg, Thread/unthread left underwear leg, Pull underwear up/down Pants- Performed by patient: Thread/unthread right pants leg, Thread/unthread left pants leg, Pull pants up/down Non-skid slipper socks- Performed by patient: Don/doff right sock, Don/doff left sock Shoes - Performed by patient: Don/doff right shoe, Don/doff left shoe Assist for footwear: Supervision/touching assist Assist for lower body dressing: Supervision or verbal cues  Function - Toileting Toileting steps completed by patient: Adjust clothing prior to toileting, Performs perineal hygiene, Adjust clothing after toileting Toileting Assistive Devices: Grab bar or rail Assist level: Touching or steadying assistance (Pt.75%)  Function - Air cabin crew transfer assistive device: Grab bar, Walker Assist level to toilet: Supervision or verbal cues Assist level from toilet: Supervision or verbal cues  Function - Chair/bed transfer Chair/bed transfer assist level: Supervision or verbal cues Chair/bed transfer assistive device: Armrests, Walker  Function - Locomotion: Wheelchair Will patient use wheelchair at discharge?: No Wheelchair activity did not occur: N/A Wheel 50 feet with 2 turns activity did not occur: N/A Wheel 150 feet activity did not occur: N/A Function - Locomotion: Ambulation Assistive device: Walker-rolling Max distance: 254f Assist level: Supervision or verbal cues Assist level: Supervision or verbal cues Assist level: Supervision or verbal cues Assist level: Supervision or verbal cues Assist level: Touching or steadying assistance (Pt > 75%)  Function - Comprehension Comprehension:  Auditory Comprehension assist level: Follows complex conversation/direction with no assist  Function - Expression Expression: Verbal Expression assist level: Expresses complex ideas: With no assist  Function - Social Interaction Social Interaction assist level: Interacts appropriately with others - No medications needed.  Function - Problem Solving Problem solving assist level: Solves complex problems: Recognizes & self-corrects  Function - Memory Memory assist level: Complete Independence: No helper Patient normally able to recall (first 3 days only): Current season, Location of own room, Staff names and faces, That he or she is in a hospital  Medical Problem List and Plan: 1.  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  DVT Prophylaxis/Anticoagulation: DVT peroneal veins of the right and left lower extremities.. Heparin/Coumadin therapy. Monitor for any bleeding episodes, INR therapeutic , off heparin  3 . Pain Management: oxycodone as needed 4. Mood: provide emotional support 5. Neuropsych: This patient is capable of making decisions on her own behalf. 6. Skin/Wound Care: routine skin checks 7. Fluids/Electrolytes/Nutrition: routine I&O with follow-up chemistries  8. Acute on chronic anemia. Follow-up CBC, hgb 9.8 vs 9.5 on 2/21 9. Atrial fibrillation. Amiodarone 200 mg twice a day. Cardiac rate controlled, Vitals:   12/09/16 1432 12/10/16 0448  BP: 127/72 (!) 144/64  Pulse: 77 67  Resp: 18 17  Temp: 98.6 F (37 C) 98.2 F (36.8 C)   10. Dysphagia. Dysphagia #3 thin liquids. Follow-up speech therapy 11. Decreased nutritional storage. Calorie counts 12.  SOB with normal sats, improves with relaxation, will ask Neuropsych to train relaxation techniques, no benzos for  now to avoid sedation 13.  Constipation - will add senna LOS (Days) 5  A FACE TO FACE EVALUATION WAS PERFORMED  Devere Brem E 12/10/2016, 7:50 AM

## 2016-12-10 NOTE — Progress Notes (Signed)
ANTICOAGULATION CONSULT NOTE - Follow Up Consult  Pharmacy Consult for coumadin Indication: DVT  Allergies  Allergen Reactions  . Nickel Rash    Patient Measurements: Height: 5' 3.5" (161.3 cm) Weight: 131 lb 6.3 oz (59.6 kg) IBW/kg (Calculated) : 53.55 Heparin Dosing Weight:   Vital Signs: Temp: 98.2 F (36.8 C) (02/26 0448) Temp Source: Oral (02/26 0448) BP: 144/64 (02/26 0448) Pulse Rate: 67 (02/26 0448)  Labs:  Recent Labs  12/08/16 0503 12/08/16 0916 12/08/16 1207 12/08/16 1533 12/09/16 0350 12/10/16 0358  HGB 9.7*  --   --   --   --   --   HCT 29.7*  --   --   --   --   --   PLT 263  --   --   --   --   --   LABPROT 27.2*  --   --   --  33.0* 27.8*  INR 2.47  --   --   --  3.15 2.54  HEPARINUNFRC 0.10*  --   --   --   --   --   TROPONINI  --  <0.03 <0.03 <0.03  --   --     Estimated Creatinine Clearance: 43.2 mL/min (by C-G formula based on SCr of 1.01 mg/dL (H)).   Medications:  Scheduled:  . amiodarone  200 mg Oral BID  . feeding supplement  1 Container Oral TID BM  . Gerhardt's butt cream   Topical BID  . ipratropium  0.5 mg Nebulization TID  . levalbuterol  0.63 mg Nebulization TID  . pantoprazole sodium  40 mg Oral Q1200  . senna-docusate  2 tablet Oral BID  . Warfarin - Pharmacist Dosing Inpatient   Does not apply q1800   Infusions:    Assessment: 71 yo female with DVT is currently on therapeutic coumadin.  INR is back down to 2.54 after holding dose last night due to supratherapeutic level.   Goal of Therapy:  INR 2-3 Monitor platelets by anticoagulation protocol: Yes   Plan:  - coumadin 2 mg po x1 - INR in am  Ashtan Laton, Tsz-Yin 12/10/2016,8:18 AM

## 2016-12-10 NOTE — Progress Notes (Signed)
Occupational Therapy Session Note  Patient Details  Name: [name] MRN: [id number] Date of Birth: Oct 25, 1945  Today's Date: 12/10/2016 OT Individual Time: 7425-9563 OT Individual Time Calculation (min): 75 min    Short Term Goals: Week 1:  OT Short Term Goal 1 (Week 1): STGs equal to LTGs set at modified independent level based on ELOS.  Skilled Therapeutic Interventions/Progress Updates:    Pt seen this session for ADL training with a focus on activity tolerance, balance, safety awareness with use of RW and family education with spouse. Pt eager to shower and get ready to go home on Wednesday Pt has a tendency to move very quickly and needs cues to pace herself to ensure she is not having dizziness prior to ambulating with Rw Pt was able to complete all ADL transfers with RW with S and self care with setup/ mod I Pt ambulated to ADL apt - kitchen with RW with S and practiced methods of retrieving items from freezer to microwave to table  Pt stated she prepared multiple freezer meal in tupperware that is stored in bottom drawer freezer, simulated set up that she would use at home Pt able to complete meal prep in that manner with mod I Discussed using a walker tray at home. Returned to room to work on 3 home exercises for R shoulder as it is winging and weak: Serratus anterior with wall stabilization pushes Shoulder flexion with wall climbs Scapular retraction with light theraband ( pt could only repeat 3x at a time to not have pain) Will review these exercises tomorrow, pt in room with spouse and all needs met.  Therapy Documentation Precautions:  Precautions Precautions: Fall Restrictions Weight Bearing Restrictions: No     Pain: Pain Assessment Pain Assessment: No/denies pain ADL:    See Function Navigator for Current Functional Status.   Therapy/Group: Individual Therapy  [name] 12/10/2016, 12:28 PM

## 2016-12-10 NOTE — Progress Notes (Signed)
Speech Language Pathology Daily Session Notes  Patient Details  Name: [name] MRN: [id number] Date of Birth: 1945-12-26  Today's Date: 12/10/2016  Session 1 SLP Individual Time: 0830-0900 SLP Individual Time Calculation (min): 30 min Session 2 SLP Individual Time: 1430-1500 SLP Individual Time Calculation (min): 30 min   Short Term Goals: Week 1: SLP Short Term Goal 1 (Week 1): STGs=LTGs  Skilled Therapeutic Interventions: Session 1 Skilled treatment session focused on addressing swallow goals. Upon SLP arrival patient reporting feeling SOB and had already placed her nasal canula back on from over night.  RN made aware and respiratory called for a breathing treatment.  SpO2 reading at 100% and patient reported that there could be a anxiety component.  SLP facilitated session by providing Min verbal and visual cues to complete pursed lip breathing and refrain from talking.  Pharyngeal strengthening exercises deferred at this visit; however, spouse present and asking questions regarding source of dysphagia and if continued need for compensatory strategies are still needed.  SLP reviewed previous objective assessment with patient and spouse and given the sensory deficits along with respiratory history a repeat object assessment is warranted.  Will schedule for tomorrow, 2/27. Continue with current plan of care.   Session 2 Skilled treatment session focused on addressing swallow goals. SLP facilitated session by providing skilled observation of patient consuming thin liquids with left head turn postioning as well as head neutral positioning.  Head turn resulted in an audible swallow with what appeared to be an increased need to complete 2 swallow per bolus.  Patient also reports that is feels different now, and is easier to swallow with head neutral. Continue to recommend repeat objective assessment to definitively determine best head position prior to discharge home.  SLP also facilitated  session with Min verbal cues to accurately complete previously taught pharyngeal strengthening exercises to address base of tongue strength and overall hyolaryngeal complex strength.  Patient able to complete 2 at a time with need for thin liquid sips before continuing due to reports of dry mouth.  Patient educated on benefits of dry mouth oral care products for home management.  Continue with current plan of care.    Function:  Eating Eating   Modified Consistency Diet: Yes Eating Assist Level: More than reasonable amount of time           Cognition Comprehension Comprehension assist level: Follows complex conversation/direction with extra time/assistive device  Expression   Expression assist level: Expresses complex ideas: With extra time/assistive device  Social Interaction Social Interaction assist level: Interacts appropriately 90% of the time - Needs monitoring or encouragement for participation or interaction.  Problem Solving Problem solving assist level: Solves complex 90% of the time/cues < 10% of the time  Memory Memory assist level: Requires cues to use assistive device;Recognizes or recalls 25 - 49% of the time/requires cueing 50 - 75% of the time    Pain Pain Assessment Pain Assessment: No/denies pain x2  Therapy/Group: Individual Therapy x2  [name]., [place] [phone]  [place] 12/10/2016, 12:52 PM

## 2016-12-10 NOTE — Progress Notes (Signed)
Physical Therapy Session Note  Patient Details  Name: [name] MRN: [id number] Date of Birth: [birth year]  Today's Date: 12/10/2016 PT Individual Time: 1100-1210 PT Individual Time Calculation (min): 70 min   Short Term Goals: Week 1:  PT Short Term Goal 1 (Week 1): STG=LTG due to ELOS      Skilled Therapeutic Interventions/Progress Updates: family ed with husband [name] for simulated car transfer to SUV height, safety for hand placement using RW, up/down 2 steps without rail with HHA; up/down flight of 10 steps R rail, with supervision. Gait x  200' With RW on carpet and level tile. 10 MWT = 2.98'/sec.  Pt continues to need cues for safe hand placement with RW.  Neuromuscular re-education via forced use, multimodal cues for sustained stretch bil heel cords standing on wedge during bil uE activity of folding towels and pillow cases on table in front.  O2 sats 92: after standing 2 min during activity.  Gait with RW to return to room.  Instruction in diaphragmatic breathing as pt becomes fatigues and unsafely hurries to complete task. Pt left resting in recliner set up for lunch, with husband.     See funtion navigator for current status. Therapy Documentation Precautions:  Precautions Precautions: Fall Restrictions Weight Bearing Restrictions: No   Vital Signs: Therapy Vitals Patient Position (if appropriate): Sitting Oxygen Therapy SpO2: 92 % Room air Pain Assessment Pain Assessment: No/denies pain      See Function Navigator for Current Functional Status.   Therapy/Group: Individual Therapy  [name] 12/10/2016, 12:29 PM

## 2016-12-10 NOTE — Progress Notes (Signed)
Day 2 of 2: Calorie Count Note  48 hour calorie count ordered.  Diet: Dysphagia 3 diet with thin liquids.  Supplements:   Boost Breeze po TID, each supplement provides 250 kcal and 9 grams of protein.  Magic cup BID between meals, each supplement provides 290 kcal and 9 grams of protein.  Day 2: Breakfast: 100 kcal, 1 grams of protein Lunch: 297 kcal, 13 grams of protein Dinner: 160 kcal, 10 grams of protein Supplements: 250 kcal, 9 grams of protein  Day 1 Total intake: 1234 kcal (73% of minimum estimated needs)  44 grams of protein (63% of minimum estimated needs)  Day 2 Total intake: 807 kcal (47% of minimum estimated needs)  33 grams of protein (47% of minimum estimated needs)  Estimated Nutritional Needs:  Kcal:  1700-1900 Protein:  70-80 grams Fluid:  1.7 - 1.9 L/day  Meal completion has been varied from 10-100%. Pt currently has Boost Breeze ordered with varied consumption. RD to additionally order Prostat to aid in protein needs. Pt encouraged to eat her food at meals and to drink her supplements.   Nutrition Dx:  Inadequate oral intake related to poor appetite as evidenced by per patient/family report; ongoing  Goal:  Patient will meet greater than or equal to 90% of their needs; progressing  Intervention:   Discontinue calorie count.  Continue Boost Breeze po TID, each supplement provides 250 kcal and 9 grams of protein.  Continue Magic cup BID with meals, each supplement provides 290 kcal and 9 grams of protein.  Provide 30 ml Prostat po BID, each supplement provides 100 kcal and 15 grams of protein.    Corrin Parker, MS, RD, LDN Pager # [phone] After hours/ weekend pager # [phone]

## 2016-12-11 ENCOUNTER — Inpatient Hospital Stay (HOSPITAL_COMMUNITY): Payer: Medicare Other | Admitting: Speech Pathology

## 2016-12-11 ENCOUNTER — Inpatient Hospital Stay (HOSPITAL_COMMUNITY): Payer: Medicare Other | Admitting: Physical Therapy

## 2016-12-11 ENCOUNTER — Inpatient Hospital Stay (HOSPITAL_COMMUNITY): Payer: Medicare Other | Admitting: Occupational Therapy

## 2016-12-11 ENCOUNTER — Inpatient Hospital Stay (HOSPITAL_COMMUNITY): Payer: Medicare Other

## 2016-12-11 DIAGNOSIS — K5901 Slow transit constipation: Secondary | ICD-10-CM

## 2016-12-11 LAB — PROTIME-INR
INR: 2.35
Prothrombin Time: 26.1 seconds — ABNORMAL HIGH (ref 11.4–15.2)

## 2016-12-11 MED ORDER — WARFARIN SODIUM 3 MG PO TABS
3.0000 mg | ORAL_TABLET | Freq: Once | ORAL | Status: AC
  Administered 2016-12-11: 3 mg via ORAL
  Filled 2016-12-11: qty 1

## 2016-12-11 NOTE — Progress Notes (Signed)
Speech Language Pathology Discharge Summary  Patient Details  Name: [name] MRN: [id number] Date of Birth: April 14, 1946  Today's Date: 12/11/2016 SLP Individual Time: 1345-1420 SLP Individual Time Calculation (min): 35 min   Skilled Therapeutic Interventions:   Skilled treatment session focused on addressing swallow goals and education. Patient reported consuming lunch of regular textures and thin liquids with head neutral position and difficulty swallowing.  SLP facilitated session by providing skilled observation of thin liquids via straw with no overt s/s of aspiration.  SLP provided education regarding diet, compensatory strategies, and aspiration s/s for home management.  Patient report no questions at this time; SLP will sign off.    Patient has met 3 of 3 long term goals.  Patient to discharge at overall Independent level.  Reasons goals not met: n/a   Clinical Impression/Discharge Summary:    Patient has made functional gains during this rehab admission and has met 3 out of 3 long term goals due to improved functional abilities.  Patient is currently Independent for utilization of swallowing compensatory strategies to minimize risk of aspiration with regular textures and thin liquids with head neutral positioning.  Patient education has been completed and patient will discharge home with assist as needed from spouse.  Patient has no further skilled SLP needs at this time as she has likely returned to her baseline.  However, SLP did educate/recommend patient discuss her dysphagia history with her oncologist prior to potential radiation treatment given that impacts is has on swallow function in individuals without a history of dysphagia.      Care Partner:  Caregiver Able to Provide Assistance: Other (comment) (n/a)  Type of Caregiver Assistance:  (n/a)  Recommendation:  None     Equipment: none   Reasons for discharge: Treatment goals met;Discharged from hospital    Patient/Family Agrees with Progress Made and Goals Achieved: Yes   Function:  Eating Eating   Modified Consistency Diet: No Eating Assist Level: No help, No cues           Cognition Comprehension Comprehension assist level: Follows complex conversation/direction with no assist  Expression   Expression assist level: Expresses complex ideas: With no assist  Social Interaction Social Interaction assist level: Interacts appropriately with others - No medications needed.  Problem Solving Problem solving assist level: Solves complex problems: Recognizes & self-corrects  Memory Memory assist level: Complete Independence: No helper   Carmelia Roller., CCC-SLP [phone]  [name] 12/11/2016, 4:55 PM

## 2016-12-11 NOTE — Progress Notes (Signed)
MBSS complete. Full report located under chart review in imaging section.  Gunnar Fusi, M.A., CCC-SLP [phone]

## 2016-12-11 NOTE — Progress Notes (Signed)
Occupational Therapy Session Note  Patient Details  Name: [name] MRN: [id number] Date of Birth: 1946/01/02  Today's Date: 12/11/2016 OT Individual Time: 3419-6222 OT Individual Time Calculation (min): 28 min    Short Term Goals: Week 1:  OT Short Term Goal 1 (Week 1): STGs equal to LTGs set at modified independent level based on ELOS.  Skilled Therapeutic Interventions/Progress Updates:    Pt seen seated EOB with no report of pain and agreeable to tx. Pt ambulates to/from all therapeutic destinations with increased time. Pt requires seated rest breaks throughout session d/t shortness of breath. Focus of session on scapular mobility protraction/retraction through use of functional mobility and improving endurance. Pt seated on EOM and protracts retracts B scapulas while holding beach ball 4x10 reps with OT mobilizing scapula to improve ROM in R scapula/decrease winging. Pt opens/closes refrigerator, push/pull vacuum and wipes table focusing on protraction/retraction. Pt retrieve items from various shelves and returns them to cabinet prior to returning to room to eat dinner. Exited session with pt EOB with food tray in front and call light in reach.  Therapy Documentation Precautions:  Precautions Precautions: Fall Restrictions Weight Bearing Restrictions: No  See Function Navigator for Current Functional Status.   Therapy/Group: Individual Therapy  Tonny Branch 12/11/2016, 5:33 PM

## 2016-12-11 NOTE — Progress Notes (Signed)
Physical Therapy Discharge Summary  Patient Details  Name: [name] MRN: [id number] Date of Birth: Aug 03, 1946  Today's Date: 12/11/2016 PT Individual Time: 1102-1129 PT Individual Time Calculation (min): 27 min    Patient has met 9 of 9 long term goals due to improved activity tolerance, improved balance, improved postural control and increased strength.  Patient to discharge at an ambulatory level Modified Independent with RW.  Patient's husband participated in family training with primary PT. Pt voiced no concerns regarding d/c on this date.   Reasons goals not met: n/a  Recommendation:  Patient will benefit from ongoing skilled PT services in home health setting to continue to advance safe functional mobility, address ongoing impairments in decreased activity tolerance, decreased balance, and minimize fall risk.  Equipment: RW  Reasons for discharge: treatment goals met  Patient/family agrees with progress made and goals achieved: Yes  Skilled PT Treatment: Patient received in recliner & agreeable to tx. Pt declined c/o pain. Session focused on grad day activities; pt ambulated with RW with Mod I over even surface but supervision over uneven surface & ramp. Pt negotiated curb with RW & 24 steps (6" + 3") with single rail and supervision. Pt also completed transfers (car, sit<>stand) and bed mobility with Mod I. Educated pt on pursed lip breathing during ambulation to help maintain WNL SpO2 levels; pt with fair return demo. Pt's vitals signs WNL throughout session, please see below; pt required seated rest breaks as needed 2/2 fatigue & SOB. At end of session pt left sitting in recliner with all needs within reach & husband present to supervise.   PT Discharge Precautions/Restrictions Precautions Precautions: Fall  Vital Signs SpO2 = 89-97% HR =86-90 bpm   Pain Pain Assessment Pain Assessment: No/denies pain  Cognition Overall Cognitive Status: Within Functional Limits  for tasks assessed  Motor  Motor Motor:  (general weakness)   Mobility Bed Mobility Bed Mobility: Rolling Left;Supine to Sit;Sit to Supine Rolling Left: 6: Modified independent (Device/Increase time) Supine to Sit: 6: Modified independent (Device/Increase time) Sit to Supine: 6: Modified independent (Device/Increase time) Transfers Transfers: Yes Sit to Stand: 6: Modified independent (Device/Increase time) Stand to Sit: 6: Modified independent (Device/Increase time)  Locomotion  Ambulation Ambulation: Yes Ambulation/Gait Assistance: 6: Modified independent (Device/Increase time) Ambulation Distance (Feet): 150 Feet Assistive device: Rolling walker High Level Ambulation High Level Ambulation: Side stepping Side Stepping: RW & supervision Stairs / Additional Locomotion Stairs: Yes Stairs Assistance: 5: Supervision Stair Management Technique:  (1 rail) Number of Stairs: 24 Height of Stairs:  (6" + 3") Ramp: 5: Supervision (RW) Curb: 5: Supervision (RW) Product manager Mobility: No   Balance Balance Balance Assessed: Yes Standardized Balance Assessment Standardized Balance Assessment: Timed Up and Go Test Timed Up and Go Test TUG: Normal TUG Normal TUG (seconds): 20 (with RW) Static Sitting Balance Static Sitting - Balance Support: Bilateral upper extremity supported  Extremity Assessment  RLE Assessment RLE Assessment: Within Functional Limits LLE Assessment LLE Assessment: Within Functional Limits   See Function Navigator for Current Functional Status.  Waunita Schooner 12/11/2016, 11:49 AM

## 2016-12-11 NOTE — Patient Care Conference (Signed)
Inpatient RehabilitationTeam Conference and Plan of Care Update Date: 12/12/2016   Time: 12:40 PM    Patient Name: [name]      Medical Record Number: [id number]  Date of Birth: 1946/06/18 Sex: Female         Room/Bed: 4M06C/4M06C-01 Payor Info: Payor: MEDICARE / Plan: MEDICARE PART A AND B / Product Type: *No Product type* /    Admitting Diagnosis: Critical Illnesss Myopathy Debility  Admit Date/Time:  12/05/2016  4:38 PM Admission Comments: No comment available   Primary Diagnosis:  <principal problem not specified> Principal Problem: <principal problem not specified>  Patient Active Problem List   Diagnosis Date Noted  . 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 Debility   . Non-small cell cancer of right lung ([place])   . Deep venous thrombosis ([place])   . Acute blood loss anemia   . Anemia of chronic disease   . PAF (paroxysmal atrial fibrillation) ([place])   . Dysphagia   . Atelectasis   . Acute renal failure (ARF) ([place])   . Surgery follow-up   . Chest tube in place   . Acute respiratory failure with hypoxia ([place])   . Hypotension   . S/P lobectomy of lung 11/02/2016  . Malignant neoplasm of upper lobe of right lung ([place]) 10/29/2016  . COPD (chronic obstructive pulmonary disease) ([place]) 09/27/2016  . Lung mass 08/21/2016  . Tobacco use disorder 08/21/2016  . SOB (shortness of breath) 08/21/2016  . Degenerative arthritis of hip 10/31/2012    Expected Discharge Date: Expected Discharge Date: 12/12/16  Team Members Present: Physician leading conference: Dr. Alysia Penna Social Worker Present: Ovidio Kin, LCSW Nurse Present: Heather Roberts, RN PT Present: Georjean Mode, PT OT Present: Clyda Greener, OT SLP Present: Gunnar Fusi, SLP PPS Coordinator present : Daiva Nakayama, RN, CRRN     Current Status/Progress Goal Weekly Team Focus  Medical   intermmitent SOB, anxiety related, strength improving  Home off O2  D/C planning   Bowel/Bladder     Cont B & B    Cont B & B     Swallow/Nutrition/ Hydration   regular textures and thin liquids Mod I  least restrictive PO Mod I   goals met and patient discharged    ADL's   Modified independent for all selfcare.  Decreased endurance for tasks but improving each day.  modified independent level overall  selfcare retraining, balance retraining, functional transfers, endurance builiting, therapeutic exercise, pt/family education   Mobility     MOD/I-SUPERVISION WITH STEPS   MOD/I LEVEL   ACTIVITY TOLERANCE, FATIGUES EASILY AND FAMILY EDUCATION.  Communication             Safety/Cognition/ Behavioral Observations            Pain        less than 3     Skin        monitoring no issues at this time        *See Care Plan and progress notes for long and short-term goals.  Barriers to Discharge: none    Possible Resolutions to Barriers:  D/C home today    Discharge Planning/Teaching Needs:    Home with husband who can provide supervision level. He has ben here and observed in therapies. Both feel ready for discharge today.     Team Discussion:  Met goals of supervision-mod/i level. Working on activity tolerance and swallowing strategies. MBS yesterday-passed and no strategies now, no follow up recommended. PT & OT  along with [facility] follow.  Revisions to Treatment Plan:  DC 2/28   Continued Need for Acute Rehabilitation Level of Care: The patient requires daily medical management by a physician with specialized training in physical medicine and rehabilitation for the following conditions: Daily direction of a multidisciplinary physical rehabilitation program to ensure safe treatment while eliciting the highest outcome that is of practical value to the patient.: Yes Daily medical management of patient stability for increased activity during participation in an intensive rehabilitation regime.: Yes Daily analysis of laboratory values and/or radiology reports with any subsequent need for medication  adjustment of medical intervention for : Post surgical problems;Other  [name], Gardiner Rhyme 12/12/2016, 12:40 PM

## 2016-12-11 NOTE — Progress Notes (Signed)
Patient complained of  Nausea, Zofran 4 mg PRN given but patient end up vomited with 50 mL clear liquid. Patient also complained of SOB, when assessed O2 sat was 95% room air, Neb Tx PRN was given. We continue to monitor.

## 2016-12-11 NOTE — Progress Notes (Signed)
ANTICOAGULATION CONSULT NOTE - Follow Up Consult  Pharmacy Consult for coumadin Indication: DVT  Allergies  Allergen Reactions  . Nickel Rash    Patient Measurements: Height: 5' 3.5" (161.3 cm) Weight: 130 lb (59 kg) IBW/kg (Calculated) : 53.55 Heparin Dosing Weight:   Vital Signs: Temp: 98.2 F (36.8 C) (02/27 0450) Temp Source: Oral (02/27 0450) BP: 150/82 (02/27 0450) Pulse Rate: 81 (02/27 0450)  Labs:  Recent Labs  12/08/16 0916 12/08/16 1207 12/08/16 1533 12/09/16 0350 12/10/16 0358 12/11/16 0600  LABPROT  --   --   --  33.0* 27.8* 26.1*  INR  --   --   --  3.15 2.54 2.35  TROPONINI <0.03 <0.03 <0.03  --   --   --     Estimated Creatinine Clearance: 43.2 mL/min (by C-G formula based on SCr of 1.01 mg/dL (H)).   Medications:  Scheduled:  . amiodarone  200 mg Oral BID  . feeding supplement  1 Container Oral TID BM  . feeding supplement (PRO-STAT SUGAR FREE 64)  30 mL Oral BID  . Gerhardt's butt cream   Topical BID  . ipratropium  0.5 mg Nebulization TID  . levalbuterol  0.63 mg Nebulization TID  . pantoprazole sodium  40 mg Oral Q1200  . senna-docusate  2 tablet Oral BID  . Warfarin - Pharmacist Dosing Inpatient   Does not apply q1800   Infusions:    Assessment: 71 yo female with DVT is currently on therapeutic coumadin.  INR down a little to 2.35 from 2.54.   Goal of Therapy:  INR 2-3 Monitor platelets by anticoagulation protocol: Yes   Plan:  - coumadin 3 mg po x1 - INR in am  Tykee Heideman, Tsz-Yin 12/11/2016,8:10 AM

## 2016-12-11 NOTE — Plan of Care (Signed)
Problem: RH Bed to Chair Transfers Goal: LTG Patient will perform bed/chair transfers w/assist (PT) LTG: Patient will perform bed/chair transfers with assistance, with/without cues (PT).  Outcome: Completed/Met Date Met: 12/11/16 Ambulatory with RW  Problem: RH Ambulation Goal: LTG Patient will ambulate in controlled environment (PT) LTG: Patient will ambulate in a controlled environment, # of feet with assistance (PT).  Outcome: Completed/Met Date Met: 12/11/16 150 ft with RW Goal: LTG Patient will ambulate in home environment (PT) LTG: Patient will ambulate in home environment, # of feet with assistance (PT).  Outcome: Completed/Met Date Met: 12/11/16 50 ft with RW  Problem: RH Stairs Goal: LTG Patient will ambulate up and down stairs w/assist (PT) LTG: Patient will ambulate up and down # of stairs with assistance (PT)  Outcome: Completed/Met Date Met: 12/11/16 24 steps with 1 rail

## 2016-12-11 NOTE — Progress Notes (Signed)
Subjective/Complaints: Vomited earlier this am, was asleep , small amt, no SOB after ward, took bowel meds with several BMs earlier nsg states it was actually mucus ROS  No N/V/D, no leg pain,  Objective: Vital Signs: Blood pressure (!) 150/82, pulse 81, temperature 98.2 F (36.8 C), temperature source Oral, resp. rate 18, height 5' 3.5" (1.613 m), weight 59 kg (130 lb), SpO2 95 %. No results found. Results for orders placed or performed during the hospital encounter of 12/05/16 (from the past 72 hour(s))  Troponin I     Status: None   Collection Time: 12/08/16  9:16 AM  Result Value Ref Range   Troponin I <0.03 <0.03 ng/mL  Troponin I     Status: None   Collection Time: 12/08/16 12:07 PM  Result Value Ref Range   Troponin I <0.03 <0.03 ng/mL  Troponin I     Status: None   Collection Time: 12/08/16  3:33 PM  Result Value Ref Range   Troponin I <0.03 <0.03 ng/mL  Protime-INR     Status: Abnormal   Collection Time: 12/09/16  3:50 AM  Result Value Ref Range   Prothrombin Time 33.0 (H) 11.4 - 15.2 seconds   INR 3.15   Protime-INR     Status: Abnormal   Collection Time: 12/10/16  3:58 AM  Result Value Ref Range   Prothrombin Time 27.8 (H) 11.4 - 15.2 seconds   INR 2.54   Protime-INR     Status: Abnormal   Collection Time: 12/11/16  6:00 AM  Result Value Ref Range   Prothrombin Time 26.1 (H) 11.4 - 15.2 seconds   INR 2.35      HEENT: normal Cardio: RRR Resp: CTA B/L GI: BS positive and NT, ND Extremity:  BS positive and NT, ND Skin:   Other dry skin,  Neuro: Alert/Oriented, Abnormal Sensory reduced in RIght third finger, Abnormal Motor 4/5 in BUE and BLE and Dysarthric Musc/Skel:  Normal and Other no pain with UE or LE ROM Gen NAD Right scapular winging, neg tinel's on R  Assessment/Plan: 1. Functional deficits secondary to debilty which require 3+ hours per day of interdisciplinary therapy in a comprehensive inpatient rehab setting. Physiatrist is providing close  team supervision and 24 hour management of active medical problems listed below. Physiatrist and rehab team continue to assess barriers to discharge/monitor patient progress toward functional and medical goals. FIM: Function - Bathing Position: Shower Body parts bathed by patient: Right arm, Left arm, Chest, Front perineal area, Buttocks, Right upper leg, Left upper leg, Right lower leg, Left lower leg, Abdomen Body parts bathed by helper: Back Assist Level: More than reasonable time  Function- Upper Body Dressing/Undressing What is the patient wearing?: Pull over shirt/dress Pull over shirt/dress - Perfomed by patient: Thread/unthread right sleeve, Thread/unthread left sleeve, Put head through opening, Pull shirt over trunk Assist Level: More than reasonable time Function - Lower Body Dressing/Undressing What is the patient wearing?: Underwear, Pants, Shoes, Ted Hose Position: Other (comment) (seated on commode) Underwear - Performed by patient: Thread/unthread right underwear leg, Thread/unthread left underwear leg, Pull underwear up/down Pants- Performed by patient: Thread/unthread right pants leg, Thread/unthread left pants leg, Pull pants up/down Non-skid slipper socks- Performed by patient: Don/doff right sock, Don/doff left sock Shoes - Performed by patient: Don/doff right shoe, Don/doff left shoe, Fasten right, Fasten left TED Hose - Performed by helper: Don/doff right TED hose, Don/doff left TED hose Assist for footwear: Setup Assist for lower body dressing: More than reasonable  time  Function - Toileting Toileting steps completed by patient: Adjust clothing prior to toileting, Performs perineal hygiene, Adjust clothing after toileting Toileting Assistive Devices: Grab bar or rail Assist level: Supervision or verbal cues  Function - Air cabin crew transfer assistive device: Grab bar, Walker Assist level to toilet: Supervision or verbal cues Assist level from toilet:  Supervision or verbal cues  Function - Chair/bed transfer Chair/bed transfer assist level: Supervision or verbal cues Chair/bed transfer assistive device: Armrests, Walker Chair/bed transfer details: Verbal cues for safe use of DME/AE  Function - Locomotion: Wheelchair Will patient use wheelchair at discharge?: No Wheelchair activity did not occur: N/A Wheel 50 feet with 2 turns activity did not occur: N/A Wheel 150 feet activity did not occur: N/A Function - Locomotion: Ambulation Assistive device: Walker-rolling Max distance: 200 Assist level: Supervision or verbal cues Assist level: Supervision or verbal cues Assist level: Supervision or verbal cues Assist level: Supervision or verbal cues Assist level: Touching or steadying assistance (Pt > 75%)  Function - Comprehension Comprehension: Auditory Comprehension assist level: Follows complex conversation/direction with no assist  Function - Expression Expression: Verbal Expression assist level: Expresses complex ideas: With no assist  Function - Social Interaction Social Interaction assist level: Interacts appropriately with others - No medications needed.  Function - Problem Solving Problem solving assist level: Solves complex problems: Recognizes & self-corrects  Function - Memory Memory assist level: Complete Independence: No helper Patient normally able to recall (first 3 days only): Current season, Location of own room, Staff names and faces, That he or she is in a hospital  Medical Problem List and Plan: 1.  Debilitation/critical illness neuropathy secondary to NSCLC/lobectomy 34/74/2595 complicated by persistent air leak status post endobronchial valve replacement 3 11/05/2016 with respiratory failure/critical illness neuropathy CInt CIR level rehab PT, OT , plan d/c in am 2.  DVT Prophylaxis/Anticoagulation: DVT peroneal veins of the right and left lower extremities.. Heparin/Coumadin therapy. Monitor for any  bleeding episodes, INR therapeutic 2/27  3 . Pain Management: oxycodone as needed 4. Mood: provide emotional support 5. Neuropsych: This patient is capable of making decisions on her own behalf. 6. Skin/Wound Care: routine skin checks 7. Fluids/Electrolytes/Nutrition: routine I&O with follow-up chemistries 8. Acute on chronic anemia. Follow-up CBC, hgb 9.8 vs 9.5 on 2/21 9. Atrial fibrillation. Amiodarone 200 mg twice a day. Cardiac rate controlled, Vitals:   12/10/16 1438 12/11/16 0450  BP: (!) 121/47 (!) 150/82  Pulse: 83 81  Resp: 18 18  Temp: 99.1 F (37.3 C) 98.2 F (36.8 C)   10. Dysphagia. Dysphagia #3 thin liquids. Follow-up speech therapy 11. Decreased nutritional storage. Calorie counts 12.  SOB with normal sats, improves with relaxation, will ask Neuropsych to train relaxation techniques, no benzos for now to avoid sedation 13.  Constipation - cleaned out after sorbitol and senna LOS (Days) 6  A FACE TO FACE EVALUATION WAS PERFORMED  KIRSTEINS,ANDREW E 12/11/2016, 7:07 AM

## 2016-12-11 NOTE — Discharge Summary (Signed)
Discharge summary job [phone]

## 2016-12-11 NOTE — Progress Notes (Signed)
Social Work Patient ID: [name], female   DOB: 18-May-1946, 71 y.o.   MRN: [id number]  Pt did well on her swallowing test-MBS today and melissa-Speech feels no follow up is needed. Pt is in agreement and so glad she does Not need to turn her head every time she swallows now. Feels ready to go home tomorrow. No equipment needs and follow up arranged via home health.

## 2016-12-11 NOTE — Discharge Summary (Signed)
[name], [name]                 ACCOUNT NO.:  [id number]  MEDICAL RECORD NO.:  [id number]  LOCATION:                                 FACILITY:  PHYSICIAN:  Charlett Blake, M.D.DATE OF BIRTH:  [birth year]  DATE OF ADMISSION:  12/05/2016 DATE OF DISCHARGE:  12/12/2016                              DISCHARGE SUMMARY   DISCHARGE DIAGNOSES: 1. Debilitation with critical illness neuropathy secondary to non-     small cell lung cancer with lobectomy complicated by persistent air     leak status post endobronchial valve replacement with respiratory     failure. 2. Deep vein thrombosis peroneal veins of the right and left lower     extremities. 3. Pain management. 4. Acute on chronic anemia. 5. Atrial fibrillation. 6. Dysphagia. 7. Decreased nutritional storage. 8. Constipation.  HISTORY OF PRESENT ILLNESS:  This is a 71 year old right-handed female with history of asthma, hypertension, tobacco abuse, hyperlipidemia as well as NSCLC followed by Oncology Services status post recent right lobe lobectomy with VATS procedure on October 29, 2016, who developed a persistent air leak followed closely by Pulmonary Services as well as Cardiothoracic Surgery.  Admitted on November 05, 2016 and underwent endobronchial valve replacement x3 on November 05, 2016 per Dr. Servando Snare. Postoperative echocardiogram with ejection fraction of 47%, grade 1 diastolic dysfunction.  Hospital course complicated by respiratory failure, sepsis required intubation as well as right-sided chest tube, bilateral lower extremity DVTs, acute DVT involving the peroneal veins on the right and left lower extremity requiring intravenous heparin and transitioned to Coumadin therapy.  She was extubated on November 14, 2016, advanced to a mechanical soft thin liquid diet.  Acute on chronic anemia and monitored.  Intermittent atrial fibrillation, postoperative cardiac rate controlled.  Chest x-ray, improved.  Due to ongoing  bouts of stamina and conditioning, she was admitted for comprehensive rehab program.  PAST MEDICAL HISTORY:  See discharge diagnoses.  SOCIAL HISTORY:  She had been independent until recent lobectomy.  FUNCTIONAL STATUS:  Upon admission to [facility], was minimal guard of 140 feet rolling walker; minimal guard sit-to-stand; min-to-mod assist activities of daily living.  PHYSICAL EXAMINATION:  VITAL SIGNS:  Blood pressure 143/67, pulse 85, temperature 98, respirations 21. GENERAL:  This was an alert female, oriented to person, place, and time. HEENT:  Pupils round and reactive to light. NECK:  Supple.  Nontender.  No JVD. CARDIAC:  Regular rate and rhythm.  No murmur. ABDOMEN:  Soft, nontender.  Good bowel sounds. LUNGS:  Fair inspiratory effort.  Clear to auscultation without wheeze.  REHABILITATION HOSPITAL COURSE:  The patient was admitted to Inpatient Rehab Services with therapies initiated on a 3-hour daily basis, consisting of physical therapy, occupational therapy, speech therapy, and rehabilitation nursing.  The following issues were addressed during the patient's rehabilitation stay.  Pertaining to Ms. Pharo critical illness myopathy related to persistent air leak and respiratory failure, she had undergone endobronchial valve replacement on November 05, 2016 would follow Cardiothoracic Surgery.  She had no chest pain or shortness of breath.  Findings of deep vein thrombosis, peroneal veins of the right lower extremity, transitioned from heparin to Coumadin therapy. No bleeding  episodes.  Goal INR of 2.0 to 3.0.  Latest INR of 2.54.  She was to be followed by her primary M.D., Dr. [name].  A home health nurse has been arranged for December 18, 2016 for next blood draw with results to Dr. Reynaldo Minium at [phone].  Her diet had slowly been advanced to a mechanical soft, which she was tolerating nicely.  Pain management with the use of oxycodone as needed.  Cardiac  rate controlled.  She continued on amiodarone.  Bouts of constipation resolved with laxative assistance.  The patient received weekly collaborative interdisciplinary team conferences to discuss estimated length of stay, family teaching, any barriers to her discharge.  She was ambulating 200 feet with a rolling walker supervision.  Up and down stairs without a rail and handheld assistance.  Strength and endurance continued to improve.  Focus was on energy conservation.  She could gather her belongings for activities of daily living and homemaking. Denied any dizziness.  Able to complete all ADLs and transfers with a rolling walker and supervision.  The patient encouraged overall progress and plan discharge to home.  DISCHARGE MEDICATIONS: 1. Amiodarone 200 mg p.o. b.i.d. 2. Protonix 40 mg p.o. daily. 3. Coumadin latest dose of 2 mg adjusted accordingly for INR of 2.0 to     3.0. 4. Oxycodone 5 mg p.o. every 4 hours as needed for pain. 5. She will continue with her nebulizer inhalers as prior to     Admission.  Labs 12/12/2016.PT 33.7 INR 3.22  FOLLOWUP:  The patient should follow up with Dr. Alysia Penna at the [facility] as needed only; Dr. Lanelle Bal, on December 27, 2016; Dr. Eilleen Kempf, call for appointment; Dr. [name], medical management.  A home health nurse has been arranged to check INR on December 18, 2016 results to Dr. [name], [phone], fax [phone].     Lauraine Rinne, P.A.   ______________________________ Charlett Blake, M.D.    DA/MEDQ  D:  12/11/2016  T:  12/11/2016  Job:  [id number]  cc:   Eilleen Kempf, M.D. Charlett Blake, M.D. [name], M.D. Lanelle Bal, MD

## 2016-12-11 NOTE — Progress Notes (Addendum)
Occupational Therapy Discharge Summary  Patient Details  Name: [name] MRN: [id number] Date of Birth: [birth year]  Today's Date: 12/11/2016 OT Individual Time: [phone] OT Individual Time Calculation (min): 58 min   Session Note:  Pt worked on bathing, dressing, and grooming during session.  She was able to ambulate to the bathroom and the shower with use of the RW and modified independence.  She was able to complete all bathing, toileting, and dressing at modified independent level.  Still with need for rest breaks after standing for periods of time greater than 3 mins.  Had her ambulate to the therapy gym with close supervision while completing stepping over certain marks in the floor.  Also had her complete small head turns and movements as well with walking.  No LOB noted with either.  Once in the gym had her rest and then transition to supine work on one set of shoulder protraction exercises AROM.  Returned to room at conclusion of 10 repetitions with pt left sitting in bedside recliner with call button and phone in reach.   Patient has met 9 of 9 long term goals due to improved activity tolerance, improved balance, ability to compensate for deficits and functional use of  RIGHT upper extremity.  Patient to discharge at overall Modified Independent level.  Patient's care partner is independent to provide the necessary physical assistance at discharge.    Reasons goals not met: NA  Recommendation:  Patient will benefit from ongoing skilled OT services in home health setting to continue to advance functional skills in the area of BADL.  Pt still demonstrates limited endurance for selfcare tasks and overall home management tasks.  She also still demonstrates RUE weakness and scapular weakness which continued to be worked on at this point.  Feel HHOT will be the best option initially to work on building strength and endurance for progression to independent level.    Equipment: No equipment  provided  Reasons for discharge: treatment goals met and discharge from hospital  Patient/family agrees with progress made and goals achieved: Yes  OT Discharge Precautions/Restrictions  Precautions Precautions: Fall Restrictions Weight Bearing Restrictions: No  Pain Pain Assessment Pain Assessment: No/denies pain ADL  See Function Section of chart for details  Vision/Perception  Vision- History Baseline Vision/History: Wears glasses Wears Glasses: At all times Patient Visual Report: Blurring of vision Vision- Assessment Vision Assessment?: Yes Ocular Range of Motion: Within Functional Limits Alignment/Gaze Preference: Within Defined Limits Tracking/Visual Pursuits: Able to track stimulus in all quads without difficulty Saccades: Within functional limits Convergence: Within functional limits Visual Fields: No apparent deficits  Cognition Overall Cognitive Status: Within Functional Limits for tasks assessed Arousal/Alertness: Awake/alert Orientation Level: Oriented X4 Memory: Appears intact Awareness: Appears intact Problem Solving: Appears intact Safety/Judgment: Appears intact Sensation Sensation Light Touch: Impaired Detail Light Touch Impaired Details: Impaired RUE Stereognosis: Appears Intact Proprioception: Appears Intact Additional Comments: Pt does report numbness in the right hand as if she was "wearing a glove", but she was able to detect light touch accurately. Coordination Gross Motor Movements are Fluid and Coordinated: Yes Fine Motor Movements are Fluid and Coordinated: Yes Motor  Motor Motor:  (general weakness) Motor - Discharge Observations: Still with generalized weakness Mobility  Bed Mobility Bed Mobility: Rolling Left;Supine to Sit;Sit to Supine Rolling Right: 6: Modified independent (Device/Increase time) Rolling Left: 6: Modified independent (Device/Increase time) Supine to Sit: 6: Modified independent (Device/Increase time) Sit to  Supine: 6: Modified independent (Device/Increase time) Transfers Transfers: Sit to Stand;Stand  to Sit Sit to Stand: 6: Modified independent (Device/Increase time) Stand to Sit: 6: Modified independent (Device/Increase time)  Trunk/Postural Assessment  Cervical Assessment Cervical Assessment: Within Functional Limits Thoracic Assessment Thoracic Assessment: Within Functional Limits Lumbar Assessment Lumbar Assessment: Within Functional Limits Postural Control Postural Control: Within Functional Limits  Balance Balance Balance Assessed: Yes Standardized Balance Assessment Standardized Balance Assessment: Timed Up and Go Test Timed Up and Go Test TUG: Normal TUG Normal TUG (seconds): 20 (with RW) Static Sitting Balance Static Sitting - Balance Support: No upper extremity supported Static Sitting - Level of Assistance: 7: Independent Dynamic Sitting Balance Dynamic Sitting - Balance Support: No upper extremity supported Dynamic Sitting - Level of Assistance: 7: Independent Static Standing Balance Static Standing - Balance Support: During functional activity Static Standing - Level of Assistance: 6: Modified independent (Device/Increase time) Dynamic Standing Balance Dynamic Standing - Balance Support: During functional activity Dynamic Standing - Level of Assistance: 6: Modified independent (Device/Increase time) Extremity/Trunk Assessment RUE Assessment RUE Assessment: Exceptions to [facility] RUE Strength RUE Overall Strength Comments: Pt with moderate scapular winging noted with all shoulder movements.  She could demonstrate full AROM for flexion with strength 3+/5.  All other joints AROM WFLS with strength 4/5. LUE Assessment LUE Assessment: Within Functional Limits (strength 4/5 throughout)   See Function Navigator for Current Functional Status.  Shellene Sweigert OTR/L 12/11/2016, 1:20 PM

## 2016-12-12 DIAGNOSIS — R0602 Shortness of breath: Secondary | ICD-10-CM

## 2016-12-12 LAB — PROTIME-INR
INR: 3.22
PROTHROMBIN TIME: 33.7 s — AB (ref 11.4–15.2)

## 2016-12-12 MED ORDER — PANTOPRAZOLE SODIUM 40 MG PO TBEC
40.0000 mg | DELAYED_RELEASE_TABLET | Freq: Every day | ORAL | Status: DC
  Filled 2016-12-12: qty 1

## 2016-12-12 MED ORDER — OXYCODONE HCL 5 MG PO TABS: 5.0000 mg | ORAL_TABLET | ORAL | 0 refills | Status: DC | PRN

## 2016-12-12 MED ORDER — PANTOPRAZOLE SODIUM 40 MG PO PACK: 40.0000 mg | PACK | Freq: Every day | ORAL | 0 refills | Status: DC

## 2016-12-12 MED ORDER — IPRATROPIUM BROMIDE 0.02 % IN SOLN
0.5000 mg | Freq: Two times a day (BID) | RESPIRATORY_TRACT | Status: DC
  Administered 2016-12-12: 0.5 mg via RESPIRATORY_TRACT
  Filled 2016-12-12 (×2): qty 2.5

## 2016-12-12 MED ORDER — WARFARIN SODIUM 2 MG PO TABS: 2.0000 mg | ORAL_TABLET | Freq: Every day | ORAL | 0 refills | Status: DC

## 2016-12-12 MED ORDER — PANTOPRAZOLE SODIUM 40 MG PO TBEC: 40.0000 mg | DELAYED_RELEASE_TABLET | Freq: Every day | ORAL | 0 refills | Status: DC

## 2016-12-12 MED ORDER — AMIODARONE HCL 200 MG PO TABS: 200.0000 mg | ORAL_TABLET | Freq: Two times a day (BID) | ORAL | 0 refills | Status: DC

## 2016-12-12 MED ORDER — LEVALBUTEROL HCL 0.63 MG/3ML IN NEBU
0.6300 mg | INHALATION_SOLUTION | Freq: Two times a day (BID) | RESPIRATORY_TRACT | Status: DC
  Administered 2016-12-12: 0.63 mg via RESPIRATORY_TRACT
  Filled 2016-12-12: qty 3

## 2016-12-12 MED ORDER — WARFARIN SODIUM 2 MG PO TABS: ORAL_TABLET | ORAL | 0 refills | Status: DC

## 2016-12-12 NOTE — Progress Notes (Signed)
ANTICOAGULATION CONSULT NOTE - Follow Up Consult  Pharmacy Consult for coumadin Indication: DVT  Allergies  Allergen Reactions  . Nickel Rash    Patient Measurements: Height: 5' 3.5" (161.3 cm) Weight: 128 lb 1.4 oz (58.1 kg) IBW/kg (Calculated) : 53.55 Heparin Dosing Weight:   Vital Signs: Temp: 98.8 F (37.1 C) (02/28 0453) Temp Source: Oral (02/28 0453) BP: 149/75 (02/28 0453) Pulse Rate: 73 (02/28 0453)  Labs:  Recent Labs  12/10/16 0358 12/11/16 0600 12/12/16 0354  LABPROT 27.8* 26.1* 33.7*  INR 2.54 2.35 3.22    Estimated Creatinine Clearance: 43.2 mL/min (by C-G formula based on SCr of 1.01 mg/dL (H)).   Medications:  Scheduled:  . amiodarone  200 mg Oral BID  . feeding supplement  1 Container Oral TID BM  . feeding supplement (PRO-STAT SUGAR FREE 64)  30 mL Oral BID  . Gerhardt's butt cream   Topical BID  . ipratropium  0.5 mg Nebulization BID  . levalbuterol  0.63 mg Nebulization BID  . pantoprazole sodium  40 mg Oral Q1200  . senna-docusate  2 tablet Oral BID  . Warfarin - Pharmacist Dosing Inpatient   Does not apply q1800   Infusions:    Assessment: 71 yo female with DVT is currently on supratherapeutic coumadin.  INR today is up to 3.32 from 2.35.    Goal of Therapy:  INR 2-3 Monitor platelets by anticoagulation protocol: Yes   Plan:  - No coumadin tonight.  If discharged today, rec to recheck INR at outpatient tomorrow to reassess dosing - INR in am if not discharged  Rella Egelston, Tsz-Yin 12/12/2016,8:11 AM

## 2016-12-12 NOTE — Progress Notes (Signed)
Subjective/Complaints:  ROS  No N/V/D, no leg pain,  Objective: Vital Signs: Blood pressure (!) 149/75, pulse 73, temperature 98.8 F (37.1 C), temperature source Oral, resp. rate 17, height 5' 3.5" (1.613 m), weight 58.1 kg (128 lb 1.4 oz), SpO2 100 %. Dg Swallowing Func-speech Pathology  Result Date: 12/11/2016 Objective Swallowing Evaluation: Type of Study: MBS-Modified Barium Swallow Study Patient Details Name: [name] MRN: [id number] Date of Birth: May 14, 1946 Today's Date: 12/11/2016 Time: SLP Start Time: 0903-SLP Stop Time: 1000 SLP Time Calculation (min): 27 min Past Medical History: Past Medical History: Diagnosis Date . Arthritis  . Asthma   asthmatic bronchitis . COPD (chronic obstructive pulmonary disease) ([place])  . Dyspnea  . History of kidney stones  . Hyperlipidemia  . Hypertension  . Pneumonia  . PONV (postoperative nausea and vomiting)   none with her hip surgery . Restless leg syndrome  . 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 ABDOMINAL HYSTERECTOMY   . BONE DENSIDEXAY  06/02/2013 . CARDIAC CATHETERIZATION   . COLONOSCOPY  08/05/2015 . CYST EXCISION    from back . CYSTO   . DENTAL EXAMINATION UNDER ANESTHESIA   . DIAGNOSTIC MAMMOGRAM  07/28/2016 . EYE SURGERY Left   cataract surgery with lens implant . LYMPH NODE DISSECTION Right 10/29/2016  Procedure: LYMPH NODE DISSECTION;  Surgeon: [name], MD;  Location: [facility];  Service: Thoracic;  Laterality: Right; . TONSILLECTOMY   . TOTAL HIP ARTHROPLASTY  10/31/2012  Procedure: TOTAL HIP ARTHROPLASTY ANTERIOR APPROACH;  Surgeon: Mcarthur Rossetti, MD;  Location: [facility];  Service: Orthopedics;  Laterality: Right;  Right Total Hip Replacement . VIDEO ASSISTED THORACOSCOPY (VATS)/WEDGE RESECTION Right 10/29/2016  Procedure: RIGHT  VIDEO ASSISTED THORACOSCOPY /RIGHT UPPER LOBE LUNG RESECTION and Right Thoracotomy;  Surgeon: [name], MD;  Location: [place];  Service: Thoracic;  Laterality: Right; [name] N/A 10/29/2016  Procedure: VIDEO BRONCHOSCOPY;  Surgeon: [name], MD;  Location: [facility];  Service: Thoracic;  Laterality: N/A; . VIDEO BRONCHOSCOPY WITH ENDOBRONCHIAL NAVIGATION N/A 08/29/2016  Procedure: VIDEO BRONCHOSCOPY WITH ENDOBRONCHIAL NAVIGATION;  Surgeon: Collene Gobble, MD;  Location: [facility];  Service: Thoracic;  Laterality: N/A; . VIDEO BRONCHOSCOPY WITH INSERTION OF INTERBRONCHIAL VALVE (IBV) N/A 11/05/2016  Procedure: VIDEO BRONCHOSCOPY WITH INSERTION OF INTERBRONCHIAL VALVE (IBV) times three;  Surgeon: [name], MD;  Location: [facility];  Service: Thoracic;  Laterality: N/A; HPI: 71 y.o. 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 Initially was ambulatory in the ICU and out of bed without difficulty. She later had worsening hypoxia and respiratory status prompting initiation of BiPAP support and PCCM consultation.  Course complicated by shock thought to be sepsis from PNA but no positive cultures, AF with RVR, bilateral LE DVT.  Intubated for hypoxic respiratory failure 1/26 and extubated 1/31.  Acute therapy recommending IP Rehab and patient admitted to program.  She has been particiapting in therapies with functional improvements noted at bedside. However, given history of sensory deficits repeat objective assessment warranted prior to advancement and discharge.   No Data Recorded Assessment / Plan / Recommendation CHL IP CLINICAL IMPRESSIONS 12/11/2016 Clinical Impression Repeat MBS complete. Patient with improved swallowing function. Mild abnormalities persist that no longer impact function. Likely decreased hyolaryngeal elevation and UES relaxation continues  to result in trace residue along posterior pharyngeal wall and above UES  opening; however, presence of small boney like prominences impeding the posterior pharyngeal wall are contributing to some degree in decreased passage of bolus through UES. Intermittent flash penetration with large sips was squeezed out of laryngeal vestibule with completion of swallows. Patient appears to be compensating well and has likely returned to her baseline. Given previously documented esophageal observations, as well recommend initiation of a regular texture diet with thin liquids, meds whole with thin one at a time and continued use of alternating solids and liquids, out of bed for meals, and stay upright for 30 minutes after eating. Education initiated with patient. SLP Visit Diagnosis Dysphagia, pharyngeal phase (R13.13) Attention and concentration deficit following -- Frontal lobe and executive function deficit following -- Impact on safety and function Mild aspiration risk   CH[facility] IP TREATMENT RECOMMENDATION 12/11/2016 Treatment Recommendations (No Data)   Prognosis 12/11/2016 Prognosis for Safe Diet Advancement Good Barriers to Reach Goals -- Barriers/Prognosis Comment -- [facility] IP DIET RECOMMENDATION 12/11/2016 SLP Diet Recommendations Regular solids;Thin liquid Liquid Administration via Cup;Straw Medication Administration Whole meds with liquid Compensations Slow rate;Small sips/bites;Follow solids with liquid Postural Changes Seated upright at 90 degrees;Remain semi-upright after after feeds/meals (Comment)   CHL IP OTHER RECOMMENDATIONS 12/11/2016 Recommended Consults Consider esophageal assessment Oral Care Recommendations Oral care BID Other Recommendations --   CH[facility] IP FOLLOW UP RECOMMENDATIONS 12/11/2016 Follow up Recommendations None   CH[facility] IP FREQUENCY AND DURATION 12/05/2016 Speech Therapy Frequency (ACUTE ONLY) min 3x week Treatment Duration 2 weeks      CHL IP ORAL PHASE 12/11/2016 Oral Phase WFL Oral - Pudding Teaspoon -- Oral - Pudding Cup -- Oral - Honey Teaspoon -- Oral - Honey Cup -- Oral -  Nectar Teaspoon -- Oral - Nectar Cup -- Oral - Nectar Straw -- Oral - Thin Teaspoon -- Oral - Thin Cup -- Oral - Thin Straw -- Oral - Puree -- Oral - Mech Soft -- Oral - Regular -- Oral - Multi-Consistency -- Oral - Pill -- Oral Phase - Comment --  CHL IP PHARYNGEAL PHASE 12/11/2016 Pharyngeal Phase Impaired Pharyngeal- Pudding Teaspoon -- Pharyngeal -- Pharyngeal- Pudding Cup -- Pharyngeal -- Pharyngeal- Honey Teaspoon NT Pharyngeal -- Pharyngeal- Honey Cup -- Pharyngeal -- Pharyngeal- Nectar Teaspoon NT Pharyngeal -- Pharyngeal- Nectar Cup NT Pharyngeal -- Pharyngeal- Nectar Straw -- Pharyngeal -- Pharyngeal- Thin Teaspoon NT Pharyngeal -- Pharyngeal- Thin Cup Pharyngeal residue - posterior pharnyx;Pharyngeal residue - cp segment;Other (Comment) Pharyngeal Material does not enter airway;Material enters airway, remains ABOVE vocal cords then ejected out Pharyngeal- Thin Straw Other (Comment);Pharyngeal residue - posterior pharnyx;Pharyngeal residue - cp segment Pharyngeal Material does not enter airway;Material enters airway, remains ABOVE vocal cords then ejected out Pharyngeal- Puree NT Pharyngeal -- Pharyngeal- Mechanical Soft NT Pharyngeal -- Pharyngeal- Regular Pharyngeal residue - posterior pharnyx;Pharyngeal residue - cp segment;Other (Comment) Pharyngeal -- Pharyngeal- Multi-consistency -- Pharyngeal -- Pharyngeal- Pill WFL;Other (Comment) Pharyngeal -- Pharyngeal Comment --  CHL IP CERVICAL ESOPHAGEAL PHASE 12/11/2016 Cervical Esophageal Phase Impaired Pudding Teaspoon -- Pudding Cup -- Honey Teaspoon -- Honey Cup -- Nectar Teaspoon -- Nectar Cup -- Nectar Straw -- Thin Teaspoon -- Thin Cup -- Thin Straw -- Puree -- Mechanical Soft -- Regular -- Multi-consistency -- Pill -- Cervical Esophageal Comment -- No flowsheet data found. Carmelia Roller., CCC-SLP [phone] [place] 12/11/2016, 10:27 AM              Results for orders placed or performed during the  hospital encounter of 12/05/16 (from the past  72 hour(s))  Protime-INR     Status: Abnormal   Collection Time: 12/10/16  3:58 AM  Result Value Ref Range   Prothrombin Time 27.8 (H) 11.4 - 15.2 seconds   INR 2.54   Protime-INR     Status: Abnormal   Collection Time: 12/11/16  6:00 AM  Result Value Ref Range   Prothrombin Time 26.1 (H) 11.4 - 15.2 seconds   INR 2.35   Protime-INR     Status: Abnormal   Collection Time: 12/12/16  3:54 AM  Result Value Ref Range   Prothrombin Time 33.7 (H) 11.4 - 15.2 seconds   INR 3.22      HEENT: normal Cardio: RRR Resp: CTA B/L GI: BS positive and NT, ND Extremity:  BS positive and NT, ND Skin:   Other dry skin,  Neuro: Alert/Oriented, Abnormal Sensory reduced in RIght third finger, Abnormal Motor 4/5 in BUE and BLE and Dysarthric Musc/Skel:  Normal and Other no pain with UE or LE ROM Gen NAD Right scapular winging, neg tinel's on R  Assessment/Plan: 1. Functional deficits secondary to debilty Stable for D/C today F/u PCP in 3-4 weeks F/u PM&R 2 weeks See D/C summary See D/C instructions FIM: Function - Bathing Position: Shower Body parts bathed by patient: Right arm, Left arm, Chest, Front perineal area, Buttocks, Right upper leg, Left upper leg, Right lower leg, Left lower leg, Abdomen, Back Body parts bathed by helper: Back Assist Level: More than reasonable time  Function- Upper Body Dressing/Undressing What is the patient wearing?: Pull over shirt/dress Pull over shirt/dress - Perfomed by patient: Thread/unthread right sleeve, Thread/unthread left sleeve, Put head through opening, Pull shirt over trunk Assist Level: More than reasonable time Function - Lower Body Dressing/Undressing What is the patient wearing?: Underwear, Pants, Shoes, Ted Hose Position: Other (comment) (seated on commode) Underwear - Performed by patient: Thread/unthread right underwear leg, Thread/unthread left underwear leg, Pull underwear up/down Pants- Performed by patient: Thread/unthread right pants  leg, Thread/unthread left pants leg, Pull pants up/down Non-skid slipper socks- Performed by patient: Don/doff right sock, Don/doff left sock Shoes - Performed by patient: Don/doff right shoe, Don/doff left shoe, Fasten right, Fasten left TED Hose - Performed by patient: Don/doff right TED hose TED Hose - Performed by helper: Don/doff left TED hose Assist for footwear: Setup Assist for lower body dressing: More than reasonable time, No Help, No cues  Function - Toileting Toileting steps completed by patient: Adjust clothing prior to toileting, Performs perineal hygiene, Adjust clothing after toileting Toileting Assistive Devices: Grab bar or rail Assist level: More than reasonable time  Function - Air cabin crew transfer assistive device: Grab bar Assist level to toilet: No Help, no cues, assistive device, takes more than a reasonable amount of time Assist level from toilet: No Help, no cues, assistive device, takes more than a reasonable amount of time  Function - Chair/bed transfer Chair/bed transfer method: Ambulatory Chair/bed transfer assist level: No Help, no cues, assistive device, takes more than a reasonable amount of time Chair/bed transfer assistive device: Walker Chair/bed transfer details: Verbal cues for safe use of DME/AE  Function - Locomotion: Wheelchair Will patient use wheelchair at discharge?: No Wheelchair activity did not occur: N/A Wheel 50 feet with 2 turns activity did not occur: N/A Wheel 150 feet activity did not occur: N/A Turns around,maneuvers to table,bed, and toilet,negotiates 3% grade,maneuvers on rugs and over doorsills: No Function - Locomotion: Ambulation Assistive device: [employer] Max  distance: 150 ft Assist level: No help, No cues, assistive device, takes more than a reasonable amount of time Assist level: No help, No cues, assistive device, takes more than a reasonable amount of time Assist level: No help, No cues, assistive  device, takes more than a reasonable amount of time Assist level: No help, No cues, assistive device, takes more than a reasonable amount of time Assist level: Supervision or verbal cues (RW)  Function - Comprehension Comprehension: Auditory Comprehension assist level: Follows complex conversation/direction with no assist  Function - Expression Expression: Verbal Expression assist level: Expresses complex ideas: With no assist  Function - Social Interaction Social Interaction assist level: Interacts appropriately with others - No medications needed.  Function - Problem Solving Problem solving assist level: Solves complex problems: Recognizes & self-corrects  Function - Memory Memory assist level: Complete Independence: No helper Patient normally able to recall (first 3 days only): Current season, Location of own room, Staff names and faces, That he or she is in a hospital  Medical Problem List and Plan: 1.  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  DVT Prophylaxis/Anticoagulation: DVT peroneal veins of the right and left lower extremities.. Heparin/Coumadin therapy. Monitor for any bleeding episodes, INR supratherapeutic 2/28  3 . Pain Management: oxycodone as needed 4. Mood: provide emotional support 5. Neuropsych: This patient is capable of making decisions on her own behalf. 6. Skin/Wound Care: routine skin checks 7. Fluids/Electrolytes/Nutrition: routine I&O with follow-up chemistries 8. Acute on chronic anemia. Follow-up CBC, hgb 9.8 vs 9.5 on 2/21 9. Atrial fibrillation. Amiodarone 200 mg twice a day. Cardiac rate controlled, Vitals:   12/11/16 1406 12/12/16 0453  BP: (!) 132/56 (!) 149/75  Pulse: 76 73  Resp: 18 17  Temp: 98.1 F (36.7 C)  98.8 F (37.1 C)   10. Dysphagia. Dysphagia #3 thin liquids. Follow-up speech therapy 11. Decreased nutritional storage. Calorie counts 12.  SOB with normal sats, improves with relaxation, will ask Neuropsych to train relaxation techniques, no benzos for now to avoid sedation 13.  Constipation - cleaned out after sorbitol and senna LOS (Days) 7  A FACE TO FACE EVALUATION WAS PERFORMED  Mialani Reicks E 12/12/2016, 7:48 AM

## 2016-12-12 NOTE — Progress Notes (Signed)
Pt discharged to home with husband and all equipment. Pt prepared for discharge with instructions.

## 2016-12-12 NOTE — Progress Notes (Signed)
Social Work Hannah Hashimoto, LCSW Social Worker Signed   Patient Care Conference Date of Service: 12/11/2016  9:06 AM      Hide copied text Hover for attribution information Inpatient RehabilitationTeam Conference and Plan of Care Update Date: 12/12/2016   Time: 12:40 PM      Patient Name: [name]      Medical Record Number: [id number]  Date of Birth: [birth year] Sex: Female         Room/Bed: 4M06C/4M06C-01 Payor Info: Payor: MEDICARE / Plan: MEDICARE PART A AND B / Product Type: *No Product type* /     Admitting Diagnosis: Critical Illnesss Myopathy Debility  Admit Date/Time:  12/05/2016  4:38 PM Admission Comments: No comment available    Primary Diagnosis:  <principal problem not specified> Principal Problem: <principal problem not specified>       Patient Active Problem List    Diagnosis Date Noted  . 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 Debility    . Non-small cell cancer of right lung ([place])    . Deep venous thrombosis ([place])    . Acute blood loss anemia    . Anemia of chronic disease    . PAF (paroxysmal atrial fibrillation) ([place])    . Dysphagia    . Atelectasis    . Acute renal failure (ARF) ([place])    . Surgery follow-up    . Chest tube in place    . Acute respiratory failure with hypoxia ([place])    . Hypotension    . S/P lobectomy of lung 11/02/2016  . Malignant neoplasm of upper lobe of right lung ([place]) 10/29/2016  . COPD (chronic obstructive pulmonary disease) ([place]) 09/27/2016  . Lung mass 08/21/2016  . Tobacco use disorder 08/21/2016  . SOB (shortness of breath) 08/21/2016  . Degenerative arthritis of hip 10/31/2012      Expected Discharge Date: Expected Discharge Date: 12/12/16   Team Members Present: Physician leading conference: Dr. Alysia Penna Social Worker Present: Ovidio Kin, LCSW Nurse Present: Heather Roberts, RN PT Present: Georjean Mode, PT OT Present: Clyda Greener, OT SLP Present: Gunnar Fusi, SLP PPS Coordinator present :  Daiva Nakayama, RN, CRRN       Current Status/Progress Goal Weekly Team Focus  Medical     intermmitent SOB, anxiety related, strength improving  Home off O2  D/C planning   Bowel/Bladder       Cont B & B   Cont B & B     Swallow/Nutrition/ Hydration     regular textures and thin liquids Mod I  least restrictive PO Mod I   goals met and patient discharged    ADL's     Modified independent for all selfcare.  Decreased endurance for tasks but improving each day.  modified independent level overall  selfcare retraining, balance retraining, functional transfers, endurance builiting, therapeutic exercise, pt/family education   Mobility       MOD/I-SUPERVISION WITH STEPS   MOD/I LEVEL   ACTIVITY TOLERANCE, FATIGUES EASILY AND FAMILY EDUCATION.  Communication               Safety/Cognition/ Behavioral Observations             Pain          less than 3     Skin          monitoring no issues at this time       *See Care Plan and progress notes for long and short-term goals.  Barriers to Discharge: none     Possible Resolutions to Barriers:  D/C home today     Discharge Planning/Teaching Needs:    Home with husband who can provide supervision level. He has ben here and observed in therapies. Both feel ready for discharge today.     Team Discussion:  Met goals of supervision-mod/i level. Working on activity tolerance and swallowing strategies. MBS yesterday-passed and no strategies now, no follow up recommended. PT & OT along with HHRN follow.  Revisions to Treatment Plan:  DC 2/28    Continued Need for Acute Rehabilitation Level of Care: The patient requires daily medical management by a physician with specialized training in physical medicine and rehabilitation for the following conditions: Daily direction of a multidisciplinary physical rehabilitation program to ensure safe treatment while eliciting the highest outcome that is of practical value to the patient.: Yes Daily medical  management of patient stability for increased activity during participation in an intensive rehabilitation regime.: Yes Daily analysis of laboratory values and/or radiology reports with any subsequent need for medication adjustment of medical intervention for : Post surgical problems;Other   [name] 12/12/2016, 12:40 PM       Patient ID: [name], female   DOB: 18-Dec-1945, 71 y.o.   MRN: [id number]

## 2016-12-12 NOTE — Progress Notes (Signed)
Social Work  Discharge Note  The overall goal for the admission was met for:   Discharge location: Yes-HOME WITH HUSBAND WHO CAN PROVIDE 24 HR SUPERVISION LEVEL  Length of Stay: Yes-7 DAYS  Discharge activity level: Yes-SUPERVISION LEVEL  Home/community participation: Yes  Services provided included: MD, RD, PT, OT, SLP, RN, CM, TR, Pharmacy and SW  Financial Services: Medicare and Private Insurance: [place]  Follow-up services arranged: Home Health: [facility] and Patient/Family has no preference for HH/DME agencies  Comments (or additional information):HUSBAND HAS BEEN HERE AND CAN PROVIDE SUPERVISION TO PT. PT DID WELL AND REACHED HER GOALS AND MET HER SWALLOWING GOALS ALSO. NOW NO SPEECH RECOMMENDED FOLLOW UP.  Patient/Family verbalized understanding of follow-up arrangements: Yes  Individual responsible for coordination of the follow-up plan: SELF & JERRY-HUSBAND  Confirmed correct DME delivered: Elease Hashimoto 12/12/2016    Elease Hashimoto

## 2016-12-13 ENCOUNTER — Telehealth: Payer: Self-pay | Admitting: *Deleted

## 2016-12-13 DIAGNOSIS — T85898D Other specified complication of other internal prosthetic devices, implants and grafts, subsequent encounter: Secondary | ICD-10-CM | POA: Diagnosis not present

## 2016-12-13 DIAGNOSIS — I82492 Acute embolism and thrombosis of other specified deep vein of left lower extremity: Secondary | ICD-10-CM | POA: Diagnosis not present

## 2016-12-13 DIAGNOSIS — J449 Chronic obstructive pulmonary disease, unspecified: Secondary | ICD-10-CM | POA: Diagnosis not present

## 2016-12-13 DIAGNOSIS — I97191 Other postprocedural cardiac functional disturbances following other surgery: Secondary | ICD-10-CM | POA: Diagnosis not present

## 2016-12-13 DIAGNOSIS — I10 Essential (primary) hypertension: Secondary | ICD-10-CM | POA: Diagnosis not present

## 2016-12-13 DIAGNOSIS — I4891 Unspecified atrial fibrillation: Secondary | ICD-10-CM | POA: Diagnosis not present

## 2016-12-13 DIAGNOSIS — J45998 Other asthma: Secondary | ICD-10-CM | POA: Diagnosis not present

## 2016-12-13 DIAGNOSIS — Z8701 Personal history of pneumonia (recurrent): Secondary | ICD-10-CM | POA: Diagnosis not present

## 2016-12-13 DIAGNOSIS — C3491 Malignant neoplasm of unspecified part of right bronchus or lung: Secondary | ICD-10-CM | POA: Diagnosis not present

## 2016-12-13 DIAGNOSIS — I82491 Acute embolism and thrombosis of other specified deep vein of right lower extremity: Secondary | ICD-10-CM | POA: Diagnosis not present

## 2016-12-13 DIAGNOSIS — G2581 Restless legs syndrome: Secondary | ICD-10-CM | POA: Diagnosis not present

## 2016-12-13 DIAGNOSIS — C3411 Malignant neoplasm of upper lobe, right bronchus or lung: Secondary | ICD-10-CM

## 2016-12-13 DIAGNOSIS — D6489 Other specified anemias: Secondary | ICD-10-CM | POA: Diagnosis not present

## 2016-12-13 NOTE — Telephone Encounter (Signed)
Oncology Nurse Navigator Documentation  Oncology Nurse Navigator Flowsheets 11/30/2016  Navigator Location [facility]  Navigator Encounter Type Other/I received referral from Dr. Servando Snare.  I called patient to set her up to be seen with Dr. Julien Nordmann on 12/24/16 labs at 1:45 and appt at 2:15.  Patients husband verbalized understanding of appt time and place.   Treatment Phase Other  Barriers/Navigation Needs (No Data)  Interventions Other  Acuity Level 1  Acuity Level 1 Minimal follow up required  Time Spent with Patient 15

## 2016-12-15 DIAGNOSIS — I82492 Acute embolism and thrombosis of other specified deep vein of left lower extremity: Secondary | ICD-10-CM | POA: Diagnosis not present

## 2016-12-15 DIAGNOSIS — I82491 Acute embolism and thrombosis of other specified deep vein of right lower extremity: Secondary | ICD-10-CM | POA: Diagnosis not present

## 2016-12-15 DIAGNOSIS — I4891 Unspecified atrial fibrillation: Secondary | ICD-10-CM | POA: Diagnosis not present

## 2016-12-15 DIAGNOSIS — I97191 Other postprocedural cardiac functional disturbances following other surgery: Secondary | ICD-10-CM | POA: Diagnosis not present

## 2016-12-15 DIAGNOSIS — T85898D Other specified complication of other internal prosthetic devices, implants and grafts, subsequent encounter: Secondary | ICD-10-CM | POA: Diagnosis not present

## 2016-12-15 DIAGNOSIS — C3491 Malignant neoplasm of unspecified part of right bronchus or lung: Secondary | ICD-10-CM | POA: Diagnosis not present

## 2016-12-17 DIAGNOSIS — I82492 Acute embolism and thrombosis of other specified deep vein of left lower extremity: Secondary | ICD-10-CM | POA: Diagnosis not present

## 2016-12-17 DIAGNOSIS — I82491 Acute embolism and thrombosis of other specified deep vein of right lower extremity: Secondary | ICD-10-CM | POA: Diagnosis not present

## 2016-12-17 DIAGNOSIS — I97191 Other postprocedural cardiac functional disturbances following other surgery: Secondary | ICD-10-CM | POA: Diagnosis not present

## 2016-12-17 DIAGNOSIS — T85898D Other specified complication of other internal prosthetic devices, implants and grafts, subsequent encounter: Secondary | ICD-10-CM | POA: Diagnosis not present

## 2016-12-17 DIAGNOSIS — C3491 Malignant neoplasm of unspecified part of right bronchus or lung: Secondary | ICD-10-CM | POA: Diagnosis not present

## 2016-12-17 DIAGNOSIS — I4891 Unspecified atrial fibrillation: Secondary | ICD-10-CM | POA: Diagnosis not present

## 2016-12-18 DIAGNOSIS — C3491 Malignant neoplasm of unspecified part of right bronchus or lung: Secondary | ICD-10-CM | POA: Diagnosis not present

## 2016-12-18 DIAGNOSIS — I82491 Acute embolism and thrombosis of other specified deep vein of right lower extremity: Secondary | ICD-10-CM | POA: Diagnosis not present

## 2016-12-18 DIAGNOSIS — I97191 Other postprocedural cardiac functional disturbances following other surgery: Secondary | ICD-10-CM | POA: Diagnosis not present

## 2016-12-18 DIAGNOSIS — I82492 Acute embolism and thrombosis of other specified deep vein of left lower extremity: Secondary | ICD-10-CM | POA: Diagnosis not present

## 2016-12-18 DIAGNOSIS — T85898D Other specified complication of other internal prosthetic devices, implants and grafts, subsequent encounter: Secondary | ICD-10-CM | POA: Diagnosis not present

## 2016-12-18 DIAGNOSIS — I4891 Unspecified atrial fibrillation: Secondary | ICD-10-CM | POA: Diagnosis not present

## 2016-12-19 ENCOUNTER — Telehealth: Payer: Self-pay

## 2016-12-19 DIAGNOSIS — C3491 Malignant neoplasm of unspecified part of right bronchus or lung: Secondary | ICD-10-CM | POA: Diagnosis not present

## 2016-12-19 DIAGNOSIS — I97191 Other postprocedural cardiac functional disturbances following other surgery: Secondary | ICD-10-CM | POA: Diagnosis not present

## 2016-12-19 DIAGNOSIS — I4891 Unspecified atrial fibrillation: Secondary | ICD-10-CM | POA: Diagnosis not present

## 2016-12-19 DIAGNOSIS — T85898D Other specified complication of other internal prosthetic devices, implants and grafts, subsequent encounter: Secondary | ICD-10-CM | POA: Diagnosis not present

## 2016-12-19 DIAGNOSIS — I82491 Acute embolism and thrombosis of other specified deep vein of right lower extremity: Secondary | ICD-10-CM | POA: Diagnosis not present

## 2016-12-19 DIAGNOSIS — I82492 Acute embolism and thrombosis of other specified deep vein of left lower extremity: Secondary | ICD-10-CM | POA: Diagnosis not present

## 2016-12-19 NOTE — Telephone Encounter (Signed)
Called today requesting verbal orders for 2xwk x 5wks for upper body strengthening endurance, iabl training

## 2016-12-19 NOTE — Telephone Encounter (Signed)
error 

## 2016-12-20 DIAGNOSIS — I82492 Acute embolism and thrombosis of other specified deep vein of left lower extremity: Secondary | ICD-10-CM | POA: Diagnosis not present

## 2016-12-20 DIAGNOSIS — I4891 Unspecified atrial fibrillation: Secondary | ICD-10-CM | POA: Diagnosis not present

## 2016-12-20 DIAGNOSIS — T85898D Other specified complication of other internal prosthetic devices, implants and grafts, subsequent encounter: Secondary | ICD-10-CM | POA: Diagnosis not present

## 2016-12-20 DIAGNOSIS — C3491 Malignant neoplasm of unspecified part of right bronchus or lung: Secondary | ICD-10-CM | POA: Diagnosis not present

## 2016-12-20 DIAGNOSIS — I82491 Acute embolism and thrombosis of other specified deep vein of right lower extremity: Secondary | ICD-10-CM | POA: Diagnosis not present

## 2016-12-20 DIAGNOSIS — I97191 Other postprocedural cardiac functional disturbances following other surgery: Secondary | ICD-10-CM | POA: Diagnosis not present

## 2016-12-21 ENCOUNTER — Telehealth: Payer: Self-pay | Admitting: *Deleted

## 2016-12-21 DIAGNOSIS — I4891 Unspecified atrial fibrillation: Secondary | ICD-10-CM | POA: Diagnosis not present

## 2016-12-21 DIAGNOSIS — T85898D Other specified complication of other internal prosthetic devices, implants and grafts, subsequent encounter: Secondary | ICD-10-CM | POA: Diagnosis not present

## 2016-12-21 DIAGNOSIS — C3491 Malignant neoplasm of unspecified part of right bronchus or lung: Secondary | ICD-10-CM | POA: Diagnosis not present

## 2016-12-21 DIAGNOSIS — I82491 Acute embolism and thrombosis of other specified deep vein of right lower extremity: Secondary | ICD-10-CM | POA: Diagnosis not present

## 2016-12-21 DIAGNOSIS — I97191 Other postprocedural cardiac functional disturbances following other surgery: Secondary | ICD-10-CM | POA: Diagnosis not present

## 2016-12-21 DIAGNOSIS — I82492 Acute embolism and thrombosis of other specified deep vein of left lower extremity: Secondary | ICD-10-CM | POA: Diagnosis not present

## 2016-12-21 NOTE — Telephone Encounter (Signed)
Oncology Nurse Navigator Documentation  Oncology Nurse Navigator Flowsheets 12/21/2016  Navigator Location [facility]  Navigator Encounter Type Telephone/[name] called me today and I called her back. She had questions about her appt with Dr. Julien Nordmann next week.  I updated her.  She was thankful for the call and update.   Telephone Outgoing Call;Incoming Call  Treatment Phase Other  Barriers/Navigation Needs Education  Education Other  Interventions Education  Education Method Verbal  Acuity Level 1  Time Spent with Patient 15

## 2016-12-24 ENCOUNTER — Ambulatory Visit: Payer: Medicare Other | Admitting: Internal Medicine

## 2016-12-24 ENCOUNTER — Encounter: Payer: Self-pay | Admitting: *Deleted

## 2016-12-24 ENCOUNTER — Other Ambulatory Visit: Payer: Medicare Other

## 2016-12-24 NOTE — Progress Notes (Signed)
Oncology Nurse Navigator Documentation  Oncology Nurse Navigator Flowsheets 12/24/2016  Navigator Location [facility]  Navigator Encounter Type Telephone/I received a vm message from [name].  I called her back.  She stated she was unable to make her appt today due to weather.  I notified scheduling to re-schedule.  I also called lab and stated patient will not be here today.    Telephone Outgoing Call;Incoming Call  Barriers/Navigation Needs Coordination of Care  Interventions Coordination of Care  Coordination of Care Appts  Acuity Level 2  Time Spent with Patient 15

## 2016-12-25 ENCOUNTER — Other Ambulatory Visit: Payer: Self-pay | Admitting: Internal Medicine

## 2016-12-25 ENCOUNTER — Telehealth: Payer: Self-pay | Admitting: Internal Medicine

## 2016-12-25 DIAGNOSIS — Z720 Tobacco use: Secondary | ICD-10-CM

## 2016-12-25 DIAGNOSIS — I4891 Unspecified atrial fibrillation: Secondary | ICD-10-CM | POA: Diagnosis not present

## 2016-12-25 DIAGNOSIS — I82491 Acute embolism and thrombosis of other specified deep vein of right lower extremity: Secondary | ICD-10-CM | POA: Diagnosis not present

## 2016-12-25 DIAGNOSIS — I97191 Other postprocedural cardiac functional disturbances following other surgery: Secondary | ICD-10-CM | POA: Diagnosis not present

## 2016-12-25 DIAGNOSIS — R05 Cough: Secondary | ICD-10-CM

## 2016-12-25 DIAGNOSIS — C3491 Malignant neoplasm of unspecified part of right bronchus or lung: Secondary | ICD-10-CM | POA: Diagnosis not present

## 2016-12-25 DIAGNOSIS — I82492 Acute embolism and thrombosis of other specified deep vein of left lower extremity: Secondary | ICD-10-CM | POA: Diagnosis not present

## 2016-12-25 DIAGNOSIS — T85898D Other specified complication of other internal prosthetic devices, implants and grafts, subsequent encounter: Secondary | ICD-10-CM | POA: Diagnosis not present

## 2016-12-25 DIAGNOSIS — R059 Cough, unspecified: Secondary | ICD-10-CM

## 2016-12-25 DIAGNOSIS — J45909 Unspecified asthma, uncomplicated: Secondary | ICD-10-CM

## 2016-12-25 NOTE — Telephone Encounter (Signed)
Patient called to reschedule her appointments due to inclement weather. Message sent to [facility] to reschedule. 12/25/16

## 2016-12-27 ENCOUNTER — Ambulatory Visit: Payer: Medicare Other | Admitting: Cardiothoracic Surgery

## 2016-12-27 ENCOUNTER — Encounter: Payer: Self-pay | Admitting: *Deleted

## 2016-12-27 DIAGNOSIS — I4891 Unspecified atrial fibrillation: Secondary | ICD-10-CM | POA: Diagnosis not present

## 2016-12-27 DIAGNOSIS — T85898D Other specified complication of other internal prosthetic devices, implants and grafts, subsequent encounter: Secondary | ICD-10-CM | POA: Diagnosis not present

## 2016-12-27 DIAGNOSIS — C3491 Malignant neoplasm of unspecified part of right bronchus or lung: Secondary | ICD-10-CM | POA: Diagnosis not present

## 2016-12-27 DIAGNOSIS — I97191 Other postprocedural cardiac functional disturbances following other surgery: Secondary | ICD-10-CM | POA: Diagnosis not present

## 2016-12-27 DIAGNOSIS — I82491 Acute embolism and thrombosis of other specified deep vein of right lower extremity: Secondary | ICD-10-CM | POA: Diagnosis not present

## 2016-12-27 DIAGNOSIS — I82492 Acute embolism and thrombosis of other specified deep vein of left lower extremity: Secondary | ICD-10-CM | POA: Diagnosis not present

## 2016-12-27 NOTE — Progress Notes (Signed)
Oncology Nurse Navigator Documentation  Oncology Nurse Navigator Flowsheets 12/27/2016  Navigator Location [facility]  Navigator Encounter Type Other/I placed LOS for scheduling to call and schedule [name] at next follow up appt time with Dr. Julien Nordmann.   Barriers/Navigation Needs Coordination of Care  Interventions Coordination of Care  Coordination of Care Appts  Acuity Level 2  Time Spent with Patient 15

## 2016-12-27 NOTE — Progress Notes (Deleted)
  Oncology Nurse Navigator Documentation  Navigator Location: [facility] (12/27/16 1500)   )Navigator Encounter Type: Other (12/27/16 1500)                         Barriers/Navigation Needs: Coordination of Care (12/27/16 1500)   Interventions: Coordination of Care (12/27/16 1500)   Coordination of Care: Appts (12/27/16 1500)        Acuity: Level 2 (12/27/16 1500)         Time Spent with Patient: 15 (12/27/16 1500)

## 2016-12-28 ENCOUNTER — Telehealth: Payer: Self-pay | Admitting: Internal Medicine

## 2016-12-28 DIAGNOSIS — I82491 Acute embolism and thrombosis of other specified deep vein of right lower extremity: Secondary | ICD-10-CM | POA: Diagnosis not present

## 2016-12-28 DIAGNOSIS — I82492 Acute embolism and thrombosis of other specified deep vein of left lower extremity: Secondary | ICD-10-CM | POA: Diagnosis not present

## 2016-12-28 DIAGNOSIS — I97191 Other postprocedural cardiac functional disturbances following other surgery: Secondary | ICD-10-CM | POA: Diagnosis not present

## 2016-12-28 DIAGNOSIS — C3491 Malignant neoplasm of unspecified part of right bronchus or lung: Secondary | ICD-10-CM | POA: Diagnosis not present

## 2016-12-28 DIAGNOSIS — T85898D Other specified complication of other internal prosthetic devices, implants and grafts, subsequent encounter: Secondary | ICD-10-CM | POA: Diagnosis not present

## 2016-12-28 DIAGNOSIS — I4891 Unspecified atrial fibrillation: Secondary | ICD-10-CM | POA: Diagnosis not present

## 2016-12-28 NOTE — Telephone Encounter (Signed)
Spoke with patient re appointment for lab/MM 3/27. Appointment for f/u scheduled per 3/15 schedule message - lab r/s from 3/12.

## 2016-12-31 DIAGNOSIS — I97191 Other postprocedural cardiac functional disturbances following other surgery: Secondary | ICD-10-CM | POA: Diagnosis not present

## 2016-12-31 DIAGNOSIS — I82491 Acute embolism and thrombosis of other specified deep vein of right lower extremity: Secondary | ICD-10-CM | POA: Diagnosis not present

## 2016-12-31 DIAGNOSIS — T85898D Other specified complication of other internal prosthetic devices, implants and grafts, subsequent encounter: Secondary | ICD-10-CM | POA: Diagnosis not present

## 2016-12-31 DIAGNOSIS — C3491 Malignant neoplasm of unspecified part of right bronchus or lung: Secondary | ICD-10-CM | POA: Diagnosis not present

## 2016-12-31 DIAGNOSIS — I82492 Acute embolism and thrombosis of other specified deep vein of left lower extremity: Secondary | ICD-10-CM | POA: Diagnosis not present

## 2016-12-31 DIAGNOSIS — I4891 Unspecified atrial fibrillation: Secondary | ICD-10-CM | POA: Diagnosis not present

## 2017-01-01 DIAGNOSIS — I82491 Acute embolism and thrombosis of other specified deep vein of right lower extremity: Secondary | ICD-10-CM | POA: Diagnosis not present

## 2017-01-01 DIAGNOSIS — I97191 Other postprocedural cardiac functional disturbances following other surgery: Secondary | ICD-10-CM | POA: Diagnosis not present

## 2017-01-01 DIAGNOSIS — C3491 Malignant neoplasm of unspecified part of right bronchus or lung: Secondary | ICD-10-CM | POA: Diagnosis not present

## 2017-01-01 DIAGNOSIS — I4891 Unspecified atrial fibrillation: Secondary | ICD-10-CM | POA: Diagnosis not present

## 2017-01-01 DIAGNOSIS — T85898D Other specified complication of other internal prosthetic devices, implants and grafts, subsequent encounter: Secondary | ICD-10-CM | POA: Diagnosis not present

## 2017-01-01 DIAGNOSIS — I82492 Acute embolism and thrombosis of other specified deep vein of left lower extremity: Secondary | ICD-10-CM | POA: Diagnosis not present

## 2017-01-02 ENCOUNTER — Other Ambulatory Visit: Payer: Self-pay | Admitting: Cardiothoracic Surgery

## 2017-01-02 DIAGNOSIS — I82492 Acute embolism and thrombosis of other specified deep vein of left lower extremity: Secondary | ICD-10-CM | POA: Diagnosis not present

## 2017-01-02 DIAGNOSIS — C3491 Malignant neoplasm of unspecified part of right bronchus or lung: Secondary | ICD-10-CM | POA: Diagnosis not present

## 2017-01-02 DIAGNOSIS — I97191 Other postprocedural cardiac functional disturbances following other surgery: Secondary | ICD-10-CM | POA: Diagnosis not present

## 2017-01-02 DIAGNOSIS — T85898D Other specified complication of other internal prosthetic devices, implants and grafts, subsequent encounter: Secondary | ICD-10-CM | POA: Diagnosis not present

## 2017-01-02 DIAGNOSIS — C3490 Malignant neoplasm of unspecified part of unspecified bronchus or lung: Secondary | ICD-10-CM

## 2017-01-02 DIAGNOSIS — I82491 Acute embolism and thrombosis of other specified deep vein of right lower extremity: Secondary | ICD-10-CM | POA: Diagnosis not present

## 2017-01-02 DIAGNOSIS — I4891 Unspecified atrial fibrillation: Secondary | ICD-10-CM | POA: Diagnosis not present

## 2017-01-03 ENCOUNTER — Ambulatory Visit
Admission: RE | Admit: 2017-01-03 | Discharge: 2017-01-03 | Disposition: A | Discharge status: Home / Self Care | Payer: Medicare Other | Source: Ambulatory Visit | Attending: Internal Medicine | Admitting: Internal Medicine

## 2017-01-03 ENCOUNTER — Other Ambulatory Visit: Payer: Self-pay

## 2017-01-03 ENCOUNTER — Encounter: Payer: Self-pay | Admitting: Cardiothoracic Surgery

## 2017-01-03 ENCOUNTER — Ambulatory Visit
Admission: RE | Admit: 2017-01-03 | Discharge: 2017-01-03 | Disposition: A | Discharge status: Home / Self Care | Payer: Medicare Other | Source: Ambulatory Visit | Attending: Cardiothoracic Surgery | Admitting: Cardiothoracic Surgery

## 2017-01-03 ENCOUNTER — Ambulatory Visit (INDEPENDENT_AMBULATORY_CARE_PROVIDER_SITE_OTHER): Payer: Self-pay | Admitting: Cardiothoracic Surgery

## 2017-01-03 VITALS — BP 164/93 | Resp 16 | Ht 63.5 in | Wt 130.4 lb

## 2017-01-03 DIAGNOSIS — J9382 Other air leak: Secondary | ICD-10-CM

## 2017-01-03 DIAGNOSIS — Z902 Acquired absence of lung [part of]: Secondary | ICD-10-CM

## 2017-01-03 DIAGNOSIS — C3490 Malignant neoplasm of unspecified part of unspecified bronchus or lung: Secondary | ICD-10-CM

## 2017-01-03 DIAGNOSIS — Z720 Tobacco use: Secondary | ICD-10-CM

## 2017-01-03 DIAGNOSIS — IMO0002 Reserved for concepts with insufficient information to code with codable children: Secondary | ICD-10-CM

## 2017-01-03 DIAGNOSIS — R0602 Shortness of breath: Secondary | ICD-10-CM | POA: Diagnosis not present

## 2017-01-03 DIAGNOSIS — C3411 Malignant neoplasm of upper lobe, right bronchus or lung: Secondary | ICD-10-CM | POA: Diagnosis not present

## 2017-01-03 DIAGNOSIS — J45909 Unspecified asthma, uncomplicated: Secondary | ICD-10-CM

## 2017-01-03 DIAGNOSIS — R059 Cough, unspecified: Secondary | ICD-10-CM

## 2017-01-03 DIAGNOSIS — R05 Cough: Secondary | ICD-10-CM

## 2017-01-03 DIAGNOSIS — J95821 Acute postprocedural respiratory failure: Secondary | ICD-10-CM

## 2017-01-03 MED ORDER — AMIODARONE HCL 200 MG PO TABS
200.0000 mg | ORAL_TABLET | Freq: Every day | ORAL | 0 refills | Status: DC
Start: 2017-01-03 — End: 2017-04-18

## 2017-01-03 NOTE — Progress Notes (Signed)
[address]       [place] [address]             [phone]      Trivia H [place] Medical Record #[id number] Date of Birth: [birth year]  Referring: Curt Bears, MD Primary Care: Geoffery Lyons, MD  Chief Complaint:   POST OP FOLLOW UP 10/29/2016 OPERATIVE REPORT PREOPERATIVE DIAGNOSIS:  Squamous cell carcinoma, right upper lobe POSTOPERATIVE DIAGNOSIS:  Squamous cell carcinoma, right upper lobe. SURGICAL PROCEDURE:  Video bronchoscopy, right video-assisted thoracoscopy, mini thoracotomy, right upper lobectomy with lymph node dissection, and placement of On-Q. SURGEON:  Lanelle Bal, M.D.  11/05/2016  OPERATIVE REPORT PREOPERATIVE DIAGNOSIS:  Persistent air leak after right upper lobectomy for non-small cell carcinoma of the lung. PROCEDURE PERFORMED:  Video bronchoscopy under general anesthesia with placement of a Spiration intrabronchial valves x3 in medial and lateral segments of the middle lobe and superior segment of right lower lobe. SURGEON:  Lanelle Bal, MD.  Cancer Staging Malignant neoplasm of upper lobe of right lung [facility]) Staging form: Lung, AJCC 8th Edition - Pathologic stage from 10/23/2016: Stage IIA (pT2b, pN0, cM0) - Signed by [name], MD on 10/30/2016  History of Present Illness:     Patient returns for postop visit after recent right upper lobectomy. Her postoperative course was complicated by persistent air leak pneumonia and subsequent sepsis. She developed respiratory failure requiring reintubation , this gradually improved  . Bronchial valves were placed. Ultimately her air leak resolved. She improved in inpatient rehabilitation and ultimately was discharged home.  She slow improvement at home. Back pain meals and doing light work around the house. Note she can climb stairs without difficulty. Does have some minor lower extremity edema.  She continues on Coumadin, amiodarone twice a day. She notes  some nausea at times.    Past Medical History:  Diagnosis Date  . Arthritis   . Asthma    asthmatic bronchitis  . COPD (chronic obstructive pulmonary disease) ([place])   . Dyspnea   . History of kidney stones   . Hyperlipidemia   . Hypertension   . Pneumonia   . PONV (postoperative nausea and vomiting)    none with her hip surgery  . Restless leg syndrome   . 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 Former Smoker  . Packs/day: 0.00  . Quit date: 08/29/2016  Smokeless Tobacco  . Never Used    History  Alcohol Use No     Allergies  Allergen Reactions  . Nickel Rash    Current Outpatient Prescriptions  Medication Sig Dispense Refill  . Acetaminophen (TYLENOL) 325 MG CAPS Take by mouth.    [name] albuterol (PROVENTIL HFA;VENTOLIN HFA) 108 (90 Base) MCG/ACT inhaler Inhale 1 puff into the lungs every 6 (six) hours as needed for wheezing or shortness of breath.    [name] amiodarone (PACERONE) 200 MG tablet Take 1 tablet (200 mg total) by mouth daily. 60 tablet 0  . fluticasone (FLONASE) 50 MCG/ACT nasal spray Place 1 spray into both nostrils daily.    . furosemide (LASIX) 20 MG tablet Take 20 mg by mouth daily as needed.    [name] ipratropium (ATROVENT) 0.02 % nebulizer solution Take 2.5 mLs (0.5 mg total) by nebulization 3 (three) times daily. 75 mL 12  . levalbuterol (XOPENEX) 0.63 MG/3ML nebulizer solution Take 3 mLs (0.63 mg total) by nebulization every 6 (  six) hours as needed for wheezing or shortness of breath. (Patient taking differently: Take 0.63 mg by nebulization every 6 (six) hours as needed for wheezing or shortness of breath. ONLY USES AT BEDTIME) 3 mL 12  . loratadine (CLARITIN) 10 MG tablet Take 10 mg by mouth daily.    . pantoprazole (PROTONIX) 40 MG tablet Take 1 tablet (40 mg total) by mouth daily. 30 tablet 0  . sodium chloride (OCEAN) 0.65 % SOLN nasal spray Place 1 spray into both nostrils as needed for congestion.  0   . Tiotropium Bromide-Olodaterol (STIOLTO RESPIMAT) 2.5-2.5 MCG/ACT AERS Inhale 2 puffs into the lungs daily.    [name] warfarin (COUMADIN) 2 MG tablet Begin 12/13/2016 30 tablet 0   No current facility-administered medications for this visit.        Physical Exam: BP (!) 164/93 (BP Location: Right Arm, Patient Position: Sitting, Cuff Size: Large)   Resp 16   Ht 5' 3.5" (1.613 m)   Wt 130 lb 6.4 oz (59.1 kg)   SpO2 96% Comment: RA  BMI 22.74 kg/m   General appearance: alert, cooperative and appears older than stated age Neurologic: intact Heart: regular rate and rhythm, S1, S2 normal, no murmur, click, rub or gallop Lungs: clear to auscultation bilaterally Abdomen: soft, non-tender; bowel sounds normal; no masses,  no organomegaly Extremities: extremities normal, atraumatic, no cyanosis or edema Wound: Incisions are well-healed   Diagnostic Studies & Laboratory data:     Recent Radiology Findings:   Dg Chest 2 View  Result Date: 01/03/2017 CLINICAL DATA:  Shortness of breath, history of lung cancer. EXAM: CHEST  2 VIEW COMPARISON:  Radiographs of December 08, 2016. FINDINGS: Stable cardiomediastinal silhouette. Atherosclerosis of thoracic aorta is noted. No pneumothorax is noted. Stable right apical pleural thickening or scarring is noted. Stable postoperative changes and volume loss are seen involving the right lung and hilar region. Left lung is hyperexpanded. No consolidative process is noted. Bony thorax is unremarkable. IMPRESSION: Stable postoperative changes seen involving the right hemithorax. No acute cardiopulmonary abnormality seen. Electronically Signed   By: Marijo Conception, M.D.   On: 01/03/2017 10:17      Recent Lab Findings: Lab Results  Component Value Date   WBC 8.7 12/08/2016   HGB 9.7 (L) 12/08/2016   HCT 29.7 (L) 12/08/2016   PLT 263 12/08/2016   GLUCOSE 111 (H) 12/06/2016   TRIG 137 11/12/2016   ALT 18 12/06/2016   AST 16 12/06/2016   NA 137 12/06/2016    K 3.9 12/06/2016   CL 98 (L) 12/06/2016   CREATININE 1.01 (H) 12/06/2016   BUN 13 12/06/2016   CO2 30 12/06/2016   INR 3.22 12/12/2016      Assessment / Plan:     Patient making adequate progress after recent right upper lobectomy, complicated by postoperative respiratory failure reintubation sepsis and persistent air leak. Her final pathology revealed stage II a non-small cell lung cancer. She has been referred to medical oncology but not had her appointment there yet due to weather issues. She had bronchial valves placed during her hospitalization. I discussed with her outpatient bronchoscopy under general anesthesia for removal next week on March 28. We will hold her Coumadin for 3 days prior.  She developed postoperative atrial fibrillation and tibial DVT.  Due to her nausea we will decrease her dose of amiodarone 200 mg once a day, will arrange for follow-up cardiology appointment    [name] MD  [address] [place] [address] Office [phone]   Beeper [phone]  01/03/2017 12:34 PM

## 2017-01-04 DIAGNOSIS — I82492 Acute embolism and thrombosis of other specified deep vein of left lower extremity: Secondary | ICD-10-CM | POA: Diagnosis not present

## 2017-01-04 DIAGNOSIS — I82491 Acute embolism and thrombosis of other specified deep vein of right lower extremity: Secondary | ICD-10-CM | POA: Diagnosis not present

## 2017-01-04 DIAGNOSIS — C3491 Malignant neoplasm of unspecified part of right bronchus or lung: Secondary | ICD-10-CM | POA: Diagnosis not present

## 2017-01-04 DIAGNOSIS — I4891 Unspecified atrial fibrillation: Secondary | ICD-10-CM | POA: Diagnosis not present

## 2017-01-04 DIAGNOSIS — I97191 Other postprocedural cardiac functional disturbances following other surgery: Secondary | ICD-10-CM | POA: Diagnosis not present

## 2017-01-04 DIAGNOSIS — T85898D Other specified complication of other internal prosthetic devices, implants and grafts, subsequent encounter: Secondary | ICD-10-CM | POA: Diagnosis not present

## 2017-01-07 DIAGNOSIS — I97191 Other postprocedural cardiac functional disturbances following other surgery: Secondary | ICD-10-CM | POA: Diagnosis not present

## 2017-01-07 DIAGNOSIS — C3491 Malignant neoplasm of unspecified part of right bronchus or lung: Secondary | ICD-10-CM | POA: Diagnosis not present

## 2017-01-07 DIAGNOSIS — I82492 Acute embolism and thrombosis of other specified deep vein of left lower extremity: Secondary | ICD-10-CM | POA: Diagnosis not present

## 2017-01-07 DIAGNOSIS — I82491 Acute embolism and thrombosis of other specified deep vein of right lower extremity: Secondary | ICD-10-CM | POA: Diagnosis not present

## 2017-01-07 DIAGNOSIS — I10 Essential (primary) hypertension: Secondary | ICD-10-CM | POA: Diagnosis not present

## 2017-01-07 DIAGNOSIS — Z9889 Other specified postprocedural states: Secondary | ICD-10-CM | POA: Diagnosis not present

## 2017-01-07 DIAGNOSIS — C3490 Malignant neoplasm of unspecified part of unspecified bronchus or lung: Secondary | ICD-10-CM | POA: Diagnosis not present

## 2017-01-07 DIAGNOSIS — Z87891 Personal history of nicotine dependence: Secondary | ICD-10-CM | POA: Diagnosis not present

## 2017-01-07 DIAGNOSIS — T85898D Other specified complication of other internal prosthetic devices, implants and grafts, subsequent encounter: Secondary | ICD-10-CM | POA: Diagnosis not present

## 2017-01-07 DIAGNOSIS — Z6822 Body mass index (BMI) 22.0-22.9, adult: Secondary | ICD-10-CM | POA: Diagnosis not present

## 2017-01-07 DIAGNOSIS — R5381 Other malaise: Secondary | ICD-10-CM | POA: Diagnosis not present

## 2017-01-07 DIAGNOSIS — I8290 Acute embolism and thrombosis of unspecified vein: Secondary | ICD-10-CM | POA: Diagnosis not present

## 2017-01-07 DIAGNOSIS — I4891 Unspecified atrial fibrillation: Secondary | ICD-10-CM | POA: Diagnosis not present

## 2017-01-07 DIAGNOSIS — I480 Paroxysmal atrial fibrillation: Secondary | ICD-10-CM | POA: Diagnosis not present

## 2017-01-07 DIAGNOSIS — J449 Chronic obstructive pulmonary disease, unspecified: Secondary | ICD-10-CM | POA: Diagnosis not present

## 2017-01-07 DIAGNOSIS — Z902 Acquired absence of lung [part of]: Secondary | ICD-10-CM | POA: Diagnosis not present

## 2017-01-07 DIAGNOSIS — E784 Other hyperlipidemia: Secondary | ICD-10-CM | POA: Diagnosis not present

## 2017-01-08 ENCOUNTER — Telehealth: Payer: Self-pay | Admitting: Internal Medicine

## 2017-01-08 ENCOUNTER — Encounter (HOSPITAL_COMMUNITY)
Admission: RE | Admit: 2017-01-08 | Discharge: 2017-01-08 | Disposition: A | Discharge status: Home / Self Care | Payer: Medicare Other | Source: Ambulatory Visit | Attending: Cardiothoracic Surgery | Admitting: Cardiothoracic Surgery

## 2017-01-08 ENCOUNTER — Other Ambulatory Visit (HOSPITAL_BASED_OUTPATIENT_CLINIC_OR_DEPARTMENT_OTHER): Payer: Medicare Other

## 2017-01-08 ENCOUNTER — Encounter (HOSPITAL_COMMUNITY): Payer: Self-pay | Admitting: *Deleted

## 2017-01-08 ENCOUNTER — Other Ambulatory Visit: Payer: Self-pay | Admitting: *Deleted

## 2017-01-08 ENCOUNTER — Ambulatory Visit (HOSPITAL_BASED_OUTPATIENT_CLINIC_OR_DEPARTMENT_OTHER): Payer: Medicare Other | Admitting: Internal Medicine

## 2017-01-08 ENCOUNTER — Encounter: Payer: Self-pay | Admitting: Internal Medicine

## 2017-01-08 ENCOUNTER — Ambulatory Visit (HOSPITAL_COMMUNITY)
Admission: RE | Admit: 2017-01-08 | Discharge: 2017-01-08 | Disposition: A | Discharge status: Home / Self Care | Payer: Medicare Other | Source: Ambulatory Visit | Attending: Cardiothoracic Surgery | Admitting: Cardiothoracic Surgery

## 2017-01-08 VITALS — BP 161/78 | HR 81 | Temp 97.8°F | Ht 63.5 in | Wt 131.2 lb

## 2017-01-08 DIAGNOSIS — R918 Other nonspecific abnormal finding of lung field: Secondary | ICD-10-CM | POA: Diagnosis not present

## 2017-01-08 DIAGNOSIS — I82491 Acute embolism and thrombosis of other specified deep vein of right lower extremity: Secondary | ICD-10-CM | POA: Diagnosis not present

## 2017-01-08 DIAGNOSIS — I10 Essential (primary) hypertension: Secondary | ICD-10-CM | POA: Diagnosis not present

## 2017-01-08 DIAGNOSIS — Z902 Acquired absence of lung [part of]: Secondary | ICD-10-CM | POA: Diagnosis not present

## 2017-01-08 DIAGNOSIS — Z01818 Encounter for other preprocedural examination: Secondary | ICD-10-CM | POA: Insufficient documentation

## 2017-01-08 DIAGNOSIS — I4891 Unspecified atrial fibrillation: Secondary | ICD-10-CM | POA: Diagnosis not present

## 2017-01-08 DIAGNOSIS — I82492 Acute embolism and thrombosis of other specified deep vein of left lower extremity: Secondary | ICD-10-CM | POA: Diagnosis not present

## 2017-01-08 DIAGNOSIS — E785 Hyperlipidemia, unspecified: Secondary | ICD-10-CM | POA: Diagnosis not present

## 2017-01-08 DIAGNOSIS — C3411 Malignant neoplasm of upper lobe, right bronchus or lung: Secondary | ICD-10-CM

## 2017-01-08 DIAGNOSIS — J449 Chronic obstructive pulmonary disease, unspecified: Secondary | ICD-10-CM | POA: Diagnosis not present

## 2017-01-08 DIAGNOSIS — Z9889 Other specified postprocedural states: Secondary | ICD-10-CM | POA: Insufficient documentation

## 2017-01-08 DIAGNOSIS — G2581 Restless legs syndrome: Secondary | ICD-10-CM | POA: Diagnosis not present

## 2017-01-08 DIAGNOSIS — J95821 Acute postprocedural respiratory failure: Secondary | ICD-10-CM | POA: Diagnosis not present

## 2017-01-08 DIAGNOSIS — J9382 Other air leak: Secondary | ICD-10-CM | POA: Insufficient documentation

## 2017-01-08 DIAGNOSIS — T85898D Other specified complication of other internal prosthetic devices, implants and grafts, subsequent encounter: Secondary | ICD-10-CM | POA: Diagnosis not present

## 2017-01-08 DIAGNOSIS — R0602 Shortness of breath: Secondary | ICD-10-CM

## 2017-01-08 DIAGNOSIS — Z87891 Personal history of nicotine dependence: Secondary | ICD-10-CM | POA: Diagnosis not present

## 2017-01-08 DIAGNOSIS — J95812 Postprocedural air leak: Secondary | ICD-10-CM | POA: Diagnosis not present

## 2017-01-08 DIAGNOSIS — F419 Anxiety disorder, unspecified: Secondary | ICD-10-CM | POA: Diagnosis not present

## 2017-01-08 DIAGNOSIS — I97191 Other postprocedural cardiac functional disturbances following other surgery: Secondary | ICD-10-CM | POA: Diagnosis not present

## 2017-01-08 DIAGNOSIS — Y838 Other surgical procedures as the cause of abnormal reaction of the patient, or of later complication, without mention of misadventure at the time of the procedure: Secondary | ICD-10-CM | POA: Diagnosis not present

## 2017-01-08 DIAGNOSIS — Z96641 Presence of right artificial hip joint: Secondary | ICD-10-CM | POA: Diagnosis not present

## 2017-01-08 DIAGNOSIS — C3491 Malignant neoplasm of unspecified part of right bronchus or lung: Secondary | ICD-10-CM | POA: Diagnosis not present

## 2017-01-08 LAB — CBC WITH DIFFERENTIAL/PLATELET
BASO%: 2.1 % — ABNORMAL HIGH (ref 0.0–2.0)
Basophils Absolute: 0.2 10*3/uL — ABNORMAL HIGH (ref 0.0–0.1)
EOS%: 3 % (ref 0.0–7.0)
Eosinophils Absolute: 0.2 10*3/uL (ref 0.0–0.5)
HCT: 33.4 % — ABNORMAL LOW (ref 34.8–46.6)
HGB: 11.3 g/dL — ABNORMAL LOW (ref 11.6–15.9)
LYMPH%: 24.8 % (ref 14.0–49.7)
MCH: 31.2 pg (ref 25.1–34.0)
MCHC: 33.9 g/dL (ref 31.5–36.0)
MCV: 91.9 fL (ref 79.5–101.0)
MONO#: 0.8 10*3/uL (ref 0.1–0.9)
MONO%: 10.7 % (ref 0.0–14.0)
NEUT%: 59.4 % (ref 38.4–76.8)
NEUTROS ABS: 4.3 10*3/uL (ref 1.5–6.5)
Platelets: 301 10*3/uL (ref 145–400)
RBC: 3.63 10*6/uL — AB (ref 3.70–5.45)
RDW: 15.1 % — AB (ref 11.2–14.5)
WBC: 7.3 10*3/uL (ref 3.9–10.3)
lymph#: 1.8 10*3/uL (ref 0.9–3.3)

## 2017-01-08 LAB — COMPREHENSIVE METABOLIC PANEL
ALT: 18 U/L (ref 0–55)
AST: 16 U/L (ref 5–34)
Albumin: 3.5 g/dL (ref 3.5–5.0)
Alkaline Phosphatase: 63 U/L (ref 40–150)
Anion Gap: 12 mEq/L — ABNORMAL HIGH (ref 3–11)
BILIRUBIN TOTAL: 0.28 mg/dL (ref 0.20–1.20)
BUN: 13.8 mg/dL (ref 7.0–26.0)
CO2: 26 meq/L (ref 22–29)
CREATININE: 1.2 mg/dL — AB (ref 0.6–1.1)
Calcium: 9.4 mg/dL (ref 8.4–10.4)
Chloride: 101 mEq/L (ref 98–109)
EGFR: 44 mL/min/{1.73_m2} — ABNORMAL LOW (ref >90)
GLUCOSE: 93 mg/dL (ref 70–140)
Potassium: 3.7 mEq/L (ref 3.5–5.1)
SODIUM: 140 meq/L (ref 136–145)
TOTAL PROTEIN: 7.1 g/dL (ref 6.4–8.3)

## 2017-01-08 LAB — PROTIME-INR
INR: 1.29
Prothrombin Time: 16.2 seconds — ABNORMAL HIGH (ref 11.4–15.2)

## 2017-01-08 LAB — APTT: aPTT: 35 seconds (ref 24–36)

## 2017-01-08 LAB — SURGICAL PCR SCREEN
MRSA, PCR: NEGATIVE
Staphylococcus aureus: NEGATIVE

## 2017-01-08 NOTE — Progress Notes (Signed)
Anesthesia Chart Review: Patient is a 71 year old female scheduled for video bronchoscopy with removal of interbronchial valve X 3 on 01/09/17 by Dr. Servando Snare. Dr. , right VATS, lung resection on 10/29/16 by Dr. Servando Snare. She is s/p RU lobectomy 10/29/16. Post-operative course was prolonged and complicated by persistent air leak s/p interbronchial valves 11/05/16. She also developed acute respiratory failure with tachycardiac 11/07/16 and required pressors and empiric antibiotics for possible sepsis (culture negative). PCCM consulted. Re-intubated 11/09/16. She developed bilateral peroneal DVTs 11/07/16 requiring anticoagulation and went into rapid afib requiring amiodarone. She developed post-operative delirium and acute kidney injury. She required short-term NG tube for TF. She was extubated 11/15/16 and discharged to [facility] 12/05/16-12/12/16.   History includes recent former smoker (quit 08/29/16), stage IIA SCC lung cancer (diagnosed 08/2016) s/p right mini-thoracotomy for RU lobectomy/LN dissection 7/78/24 complicated by persistent air leak s/p placement of intrabronchial valves X 3 11/05/16, post-operative afib, post-operative BLE DVT 11/07/16, post-operative N/V, HTN, HLD, asthma, arthritis, COPD, nephrolithiasis, RLS, right THA '14, tonsillectomy, hysterectomy.   - PCP is Dr. [name]. - Cardiologist is Dr. Vear Clock at [facility], first established 10/19/16 for preoperative evaluation. Patient reported, last visit 01/07/17 and was no longer in afib at that time. Records requested. - HEM-ONC is Dr. Curt Bears.  Meds include albuterol, amiodarone, Flonase, Lasix, Atrovent, Xopenex, Claritin, Protonix, Stiolto Respimat, warfarin, Ambien. Dr. Servando Snare instructed her to hold warfarin for three days prior to surgery (last dose 01/05/17).      BP (!) 151/76   Pulse 88   Temp 36.6 C   Resp 18   Ht 5' 3.5" (1.613 m)   Wt 131 lb 1.6 oz (59.5 kg)   SpO2 95%   BMI 22.86 kg/m   EKG  01/07/17 [facility] CV): Requested.   Echo 11/07/16: Impressions: - Very technically difficult study with poor echo windows. Definity   contrast given. LVEF is grossly normal at 60-65%, there is mild   LVH and diastolic dysfunction with elevated LV filling pressure.  Nuclear stress test 10/22/16 [facility] CV): Impression: 1. The resting ECG demonstrated normal sinus rhythm, normal resting conduction, no resting arrhythmias and normal rest repolarization. Stress ECG is nondiagnostic for ischemia as it is a pharmacologic stress using Lexiscan. Stress symptoms included dyspnea. 2. Myocardial perfusion imaging is normal. Overall left ventricular systolic function was normal without regional wall motion abnormalities. The left ventricular ejection fraction was 75%. 3. This is a normal/low risk study, based on the study, patient can be taken up for the surgery with acceptable CV risk.   Cardiopulmonary Exercise Test 10/10/16: Conclusion: Exercise testing with gas exchange demonstrates normal functional capacity when compared to matched sedentary norms. There is no indication for cardiovascular impairment. Patient is primarily ventilatory limited and is clearly of obstructive nature. VE/VCO2 is elevated and indicates noted significant dead space ventilation mostly due to obstructive lung disease. Patient's measured VO46mx is normal at 18.0 ml/kg/min and meets CPX criteria for lobectomy candidacy in risk stratification.  CXR 01/08/17: IMPRESSION: Postoperative change on the right, stable from the prior exam.  PFT's 08/24/2016: FEV1: 1.22 57% DLCO 12.02 52% The FEV1, FEV1/FVC ratio and FEF25-75% are reduced indicating airway obstruction. The TLC, FRC and RV are increased indicating overinflation. Following administration of bronchodilators, there is no significant response. The reduced diffusing capacity indicates a moderately severe loss of functional alveolar capillary surface. However,  the diffusing capacity was not corrected for the patient's hemoglobin Moderately severe Obstructive Airways Disease -Emphysematous Type  BLE venous  Duplex 11/07/16: Summary: There is evidence of acute deep vein thrombosis involving the peroneal veins of the right and left lower extremities. There is no evidence of superficial vein thrombosis involving the right and left lower extremities. There is no evidence of a Baker&'s cyst bilaterally.  Preoperative labs noted. Cr 1.2. H/H 11.3/33.4. Glucose 93. PTT 35. PT/INR 16.2/1.29.   Patient's PAT appointment was at 11:00 AM today. Records from cardiologist Dr. Woody Seller requested early afternoon, but not yet received. I called again for records around 4:30 PM. Staff confirmed patient was seen yesterday with EKG. I was told they would fax office note and EKG today, but as of 5:30 PM records were not yet received. There are EKGs in Epic, but most recent one was from her 01/07/17 visit at [facility] and faxed copy has not yet been received. If anesthesiologist feels patient requires another EKG prior to her procedure then he/she will need to order and let staff know. Definitive anesthesia plan following anesthesiologist evaluation tomorrow.  George Hugh [facility] Short Stay Center/Anesthesiology Phone [phone] 01/08/2017 5:30 PM

## 2017-01-08 NOTE — Progress Notes (Addendum)
I spoke with [name] at [facility] [place] office  [name] that patient does not need T/S or  Consent for blood transfusion.  [name] notified of Pt of 33.7, INR 3.2- Last dose of Coumadin was 01/05/17.  [name] reports that she was seen by [name]. At [facility] cardiology on 01/07/17 and she was not in afib

## 2017-01-08 NOTE — Progress Notes (Signed)
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  PRIOR THERAPY: Status post right upper lobectomy with lymph node dissection under the care of Dr. Servando Snare on 10/29/2016.  CURRENT THERAPY: Observation.  INTERVAL HISTORY: [name] 71 y.o. female returns to the clinic today for follow-up visit accompanied by her husband. The patient is feeling fine today with no specific complaints except for fatigue and shortness breath with exertion. She underwent right upper lobectomy with lymph node dissection under the care of Dr. Servando Snare on 10/29/2016. The final pathology was consistent with adenosquamous carcinoma measuring 5.3 cm and no evidence of metastatic disease to the dissected lymph nodes. Her surgery was complicated by air leak followed by sepsis. She stayed in the hospital for around 45 days. She was recently discharged and she still have valves for prevention of the air leak. She is scheduled for removal of the valves in the next few days. She continues to have mild right-sided chest pain and dry cough. She denied having any hemoptysis. She has no nausea, vomiting, diarrhea or constipation. She had repeat CT scan of the chest recently and she is here for evaluation and discussion of her scan results and treatment options.  MEDICAL HISTORY: Past Medical History:  Diagnosis Date  . Arthritis   . Asthma    asthmatic bronchitis  . COPD (chronic obstructive pulmonary disease) ([facility])   . Dyspnea   . History of kidney stones   . Hyperlipidemia   . Hypertension   . Pneumonia   . PONV (postoperative nausea and vomiting)    none with her hip surgery  . Restless leg syndrome   . 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  MEDICATIONS:  Current Outpatient Prescriptions  Medication Sig Dispense Refill  . acetaminophen (TYLENOL) 500 MG tablet Take 1,000 mg by mouth every 6 (six) hours as needed for mild pain.    [name] albuterol (PROVENTIL HFA;VENTOLIN HFA) 108 (90 Base) MCG/ACT inhaler Inhale 1 puff into the lungs every 6 (six) hours as needed for wheezing or shortness of breath.    [name] albuterol (PROVENTIL) (2.5 MG/3ML) 0.083% nebulizer solution Take 2.5 mg by nebulization every 6 (six) hours as needed for wheezing or shortness of breath.    [name] amiodarone (PACERONE) 200 MG tablet Take 1 tablet (200 mg total) by mouth daily. 60 tablet 0  . fluticasone (FLONASE) 50 MCG/ACT nasal spray Place 2 sprays into both nostrils daily.     . furosemide (LASIX) 20 MG tablet Take 20 mg by mouth daily as needed for fluid or edema.     [name] ipratropium (ATROVENT) 0.02 % nebulizer solution Take 2.5 mLs (0.5 mg total) by nebulization 3 (three) times daily. (Patient not taking: Reported on 01/04/2017) 75 mL 12  . levalbuterol (XOPENEX) 0.63 MG/3ML nebulizer solution Take 3 mLs (0.63 mg total) by nebulization every 6 (six) hours as needed for wheezing or shortness of breath. (Patient not taking: Reported on 01/04/2017) 3 mL 12  . loratadine (CLARITIN) 10 MG tablet Take 10 mg by mouth daily.    . pantoprazole (PROTONIX) 40 MG tablet Take 1 tablet (40 mg total) by mouth daily. 30 tablet 0  . sodium chloride (OCEAN) 0.65 % SOLN nasal spray  Place 1 spray into both nostrils as needed for congestion.  0  . Tiotropium Bromide-Olodaterol (STIOLTO RESPIMAT) 2.5-2.5 MCG/ACT AERS Inhale 2 puffs into the lungs daily.    [name] warfarin (COUMADIN) 2 MG tablet Begin 12/13/2016 (Patient taking differently: Take 1-2 mg by mouth See admin instructions. Takes 2 mg daily except on Tuesdays, Thursdays and Saturdays and takes 1 mg) 30 tablet 0  . zolpidem (AMBIEN) 10 MG tablet Take 5 mg by mouth at bedtime as needed for sleep.     No current  facility-administered medications for this visit.     SURGICAL HISTORY:  Past Surgical History:  Procedure Laterality Date  . ABDOMINAL HYSTERECTOMY    . BONE DENSIDEXAY  06/02/2013  . CARDIAC CATHETERIZATION    . COLONOSCOPY  08/05/2015  . CYST EXCISION     from back  . CYSTO    . DENTAL EXAMINATION UNDER ANESTHESIA    . DIAGNOSTIC MAMMOGRAM  07/28/2016  . EYE SURGERY Left    cataract surgery with lens implant  . LYMPH NODE DISSECTION Right 10/29/2016   Procedure: LYMPH NODE DISSECTION;  Surgeon: [name], MD;  Location: [facility];  Service: Thoracic;  Laterality: Right;  . TONSILLECTOMY    . TOTAL HIP ARTHROPLASTY  10/31/2012   Procedure: TOTAL HIP ARTHROPLASTY ANTERIOR APPROACH;  Surgeon: Mcarthur Rossetti, MD;  Location: [facility];  Service: Orthopedics;  Laterality: Right;  Right Total Hip Replacement  . VIDEO ASSISTED THORACOSCOPY (VATS)/WEDGE RESECTION Right 10/29/2016   Procedure: RIGHT  VIDEO ASSISTED THORACOSCOPY /RIGHT UPPER LOBE LUNG RESECTION and Right Thoracotomy;  Surgeon: [name], MD;  Location: [facility];  Service: Thoracic;  Laterality: Right;  [name] N/A 10/29/2016   Procedure: VIDEO BRONCHOSCOPY;  Surgeon: [name], MD;  Location: [facility];  Service: Thoracic;  Laterality: N/A;  . VIDEO BRONCHOSCOPY WITH ENDOBRONCHIAL NAVIGATION N/A 08/29/2016   Procedure: VIDEO BRONCHOSCOPY WITH ENDOBRONCHIAL NAVIGATION;  Surgeon: Collene Gobble, MD;  Location: [facility];  Service: Thoracic;  Laterality: N/A;  . VIDEO BRONCHOSCOPY WITH INSERTION OF INTERBRONCHIAL VALVE (IBV) N/A 11/05/2016   Procedure: VIDEO BRONCHOSCOPY WITH INSERTION OF INTERBRONCHIAL VALVE (IBV) times three;  Surgeon: [name], MD;  [facility];  Service: Thoracic;  Laterality: N/A;    REVIEW OF SYSTEMS:  Constitutional: positive for fatigue Eyes: negative Ears, nose, mouth, throat, and face: negative Respiratory: positive for cough and dyspnea on  exertion Cardiovascular: negative Gastrointestinal: negative Genitourinary:negative Integument/breast: negative Hematologic/lymphatic: negative Musculoskeletal:negative Neurological: negative Behavioral/Psych: negative Endocrine: negative Allergic/Immunologic: negative   PHYSICAL EXAMINATION: General appearance: alert, cooperative, fatigued and no distress Head: Normocephalic, without obvious abnormality, atraumatic Neck: no adenopathy, no JVD, supple, symmetrical, trachea midline and thyroid not enlarged, symmetric, no tenderness/mass/nodules Lymph nodes: Cervical, supraclavicular, and axillary nodes normal. Resp: clear to auscultation bilaterally Back: symmetric, no curvature. ROM normal. No CVA tenderness. Cardio: regular rate and rhythm, S1, S2 normal, no murmur, click, rub or gallop GI: soft, non-tender; bowel sounds normal; no masses,  no organomegaly Extremities: extremities normal, atraumatic, no cyanosis or edema Neurologic: Alert and oriented X 3, normal strength and tone. Normal symmetric reflexes. Normal coordination and gait  ECOG PERFORMANCE STATUS: 1 - Symptomatic but completely ambulatory  Blood pressure (!) 161/78, pulse 81, temperature 97.8 F (36.6 C), temperature source Oral, height 5' 3.5" (1.613 m), weight 131 lb 3.2 oz (59.5 kg), SpO2 95 %.  LABORATORY DATA: Lab Results  Component Value Date   WBC 7.3 01/08/2017   HGB 11.3 (L)  01/08/2017   HCT 33.4 (L) 01/08/2017   MCV 91.9 01/08/2017   PLT 301 01/08/2017      Chemistry      Component Value Date/Time   NA 137 12/06/2016 0404   NA 141 09/11/2016 1324   K 3.9 12/06/2016 0404   K 3.9 09/11/2016 1324   CL 98 (L) 12/06/2016 0404   CO2 30 12/06/2016 0404   CO2 30 (H) 09/11/2016 1324   BUN 13 12/06/2016 0404   BUN 19.7 09/11/2016 1324   CREATININE 1.01 (H) 12/06/2016 0404   CREATININE 0.9 09/11/2016 1324      Component Value Date/Time   CALCIUM 8.9 12/06/2016 0404   CALCIUM 10.1 09/11/2016 1324    ALKPHOS 55 12/06/2016 0404   ALKPHOS 70 09/11/2016 1324   AST 16 12/06/2016 0404   AST 15 09/11/2016 1324   ALT 18 12/06/2016 0404   ALT 18 09/11/2016 1324   BILITOT 0.2 (L) 12/06/2016 0404   BILITOT 0.22 09/11/2016 1324       RADIOGRAPHIC STUDIES: Dg Chest 2 View  Result Date: 01/03/2017 CLINICAL DATA:  Shortness of breath, history of lung cancer. EXAM: CHEST  2 VIEW COMPARISON:  Radiographs of December 08, 2016. FINDINGS: Stable cardiomediastinal silhouette. Atherosclerosis of thoracic aorta is noted. No pneumothorax is noted. Stable right apical pleural thickening or scarring is noted. Stable postoperative changes and volume loss are seen involving the right lung and hilar region. Left lung is hyperexpanded. No consolidative process is noted. Bony thorax is unremarkable. IMPRESSION: Stable postoperative changes seen involving the right hemithorax. No acute cardiopulmonary abnormality seen. Electronically Signed   By: Marijo Conception, M.D.   On: 01/03/2017 10:17   Ct Chest Wo Contrast  Result Date: 01/03/2017 CLINICAL DATA:  Status post right upper lobe removal.  Lung cancer. EXAM: CT CHEST WITHOUT CONTRAST TECHNIQUE: Multidetector CT imaging of the chest was performed following the standard protocol without IV contrast. COMPARISON:  08/17/2016 FINDINGS: Cardiovascular: Normal heart size. There is aortic atherosclerosis identified. Calcification within the RCA coronary artery noted. No pericardial effusion. Mediastinum/Nodes: No mediastinal or hilar adenopathy. The trachea appears patent and is midline. Normal appearance of the esophagus. Lungs/Pleura: Moderate-to-marked changes of centrilobular and paraseptal emphysema. Postoperative changes from right upper lobe resection. There is complete atelectasis of the right middle lobe is noted. Bronchiectasis, architectural distortion and tree in bud nodularity is noted throughout the right lower lobe. Upper Abdomen: No acute abnormality. Multiple  areas of scarring identified involving the left kidney. Small stones are identified within the left renal collecting system. Stones and/or sludge noted within the gallbladder. Musculoskeletal: No aggressive lytic or sclerotic bone lesions. IMPRESSION: 1. Postsurgical changes from right upper lobe lobectomy. 2. Complete atelectasis of the right middle lobe which may reflect postsurgical change. 3. Extensive postinflammatory changes within the right lower lobe including bronchiectasis, architectural distortion and tree-in-bud nodularity. Findings may reflect recurrent aspiration status post surgery and/or chronic infection. Electronically Signed   By: Kerby Moors M.D.   On: 01/03/2017 13:28   Dg Swallowing Func-speech Pathology  Result Date: 12/11/2016 Objective Swallowing Evaluation: Type of Study: MBS-Modified Barium Swallow Study Patient Details Name: [name] MRN: [id number] Date of Birth: Jan 07, 1946 Today's Date: 12/11/2016 Time: SLP Start Time: 0903-SLP Stop Time: 1000 SLP Time Calculation (min): 27 min Past Medical History: Past Medical History: Diagnosis Date . Arthritis  . Asthma   asthmatic bronchitis . COPD (chronic obstructive pulmonary disease) ([facility])  . Dyspnea  . History of kidney stones  .  Hyperlipidemia  . Hypertension  . Pneumonia  . PONV (postoperative nausea and vomiting)   none with her hip surgery . Restless leg syndrome  . 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 ABDOMINAL HYSTERECTOMY   . BONE DENSIDEXAY  06/02/2013 . CARDIAC CATHETERIZATION   . COLONOSCOPY  08/05/2015 . CYST EXCISION    from back . CYSTO   . DENTAL EXAMINATION UNDER ANESTHESIA   . DIAGNOSTIC MAMMOGRAM  07/28/2016 . EYE SURGERY Left   cataract surgery with lens implant . LYMPH NODE DISSECTION Right 10/29/2016  Procedure: LYMPH NODE DISSECTION;  Surgeon: [name], MD;  Location: [facility];  Service: Thoracic;  Laterality:  Right; . TONSILLECTOMY   . TOTAL HIP ARTHROPLASTY  10/31/2012  Procedure: TOTAL HIP ARTHROPLASTY ANTERIOR APPROACH;  Surgeon: Mcarthur Rossetti, MD;  [facility];  Service: Orthopedics;  Laterality: Right;  Right Total Hip Replacement . VIDEO ASSISTED THORACOSCOPY (VATS)/WEDGE RESECTION Right 10/29/2016  Procedure: RIGHT  VIDEO ASSISTED THORACOSCOPY /RIGHT UPPER LOBE LUNG RESECTION and Right Thoracotomy;  Surgeon: [name], MD;  Location: [facility];  Service: Thoracic;  Laterality: Right; [name] N/A 10/29/2016  Procedure: VIDEO BRONCHOSCOPY;  Surgeon: [name], MD;  Location: [facility];  Service: Thoracic;  Laterality: N/A; . VIDEO BRONCHOSCOPY WITH ENDOBRONCHIAL NAVIGATION N/A 08/29/2016  Procedure: VIDEO BRONCHOSCOPY WITH ENDOBRONCHIAL NAVIGATION;  Surgeon: Collene Gobble, MD;  [facility];  Service: Thoracic;  Laterality: N/A; . VIDEO BRONCHOSCOPY WITH INSERTION OF INTERBRONCHIAL VALVE (IBV) N/A 11/05/2016  Procedure: VIDEO BRONCHOSCOPY WITH INSERTION OF INTERBRONCHIAL VALVE (IBV) times three;  Surgeon: [name], MD;  [facility];  Service: Thoracic;  Laterality: N/A; HPI: 71 y.o. 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 Initially was ambulatory in the ICU and out of bed without difficulty. She later had worsening hypoxia and respiratory status prompting initiation of BiPAP support and PCCM consultation.  Course complicated by shock thought to be sepsis from PNA but no positive cultures, AF with RVR, bilateral LE DVT.  Intubated for hypoxic respiratory failure 1/26 and extubated 1/31.  Acute therapy recommending IP Rehab and patient admitted to program.  She has been particiapting in therapies with functional improvements noted at bedside. However, given history of sensory deficits repeat objective  assessment warranted prior to advancement and discharge.   No Data Recorded Assessment / Plan / Recommendation [facility] IP CLINICAL IMPRESSIONS 12/11/2016 Clinical Impression Repeat MBS complete. Patient with improved swallowing function. Mild abnormalities persist that no longer impact function. Likely decreased hyolaryngeal elevation and UES relaxation continues to result in trace residue along posterior pharyngeal wall and above UES opening; however, presence of small boney like prominences impeding the posterior pharyngeal wall are contributing to some degree in decreased passage of bolus through UES. Intermittent flash penetration with large sips was squeezed out of laryngeal vestibule with completion of swallows. Patient appears to be compensating well and has likely returned to her baseline. Given previously documented esophageal observations, as well recommend initiation of a regular texture diet with thin liquids, meds whole with thin one at a time and continued use of alternating solids and liquids, out of bed for meals, and stay upright for 30 minutes after eating. Education initiated with patient. SLP Visit Diagnosis Dysphagia, pharyngeal phase (R13.13) Attention and concentration deficit  following -- Frontal lobe and executive function deficit following -- Impact on safety and function Mild aspiration risk   [facility] IP TREATMENT RECOMMENDATION 12/11/2016 Treatment Recommendations (No Data)   Prognosis 12/11/2016 Prognosis for Safe Diet Advancement Good Barriers to Reach Goals -- Barriers/Prognosis Comment -- [facility] IP DIET RECOMMENDATION 12/11/2016 SLP Diet Recommendations Regular solids;Thin liquid Liquid Administration via Cup;Straw Medication Administration Whole meds with liquid Compensations Slow rate;Small sips/bites;Follow solids with liquid Postural Changes Seated upright at 90 degrees;Remain semi-upright after after feeds/meals (Comment)   CH[facility] IP OTHER RECOMMENDATIONS 12/11/2016 Recommended Consults Consider  esophageal assessment Oral Care Recommendations Oral care BID Other Recommendations --   [facility] IP FOLLOW UP RECOMMENDATIONS 12/11/2016 Follow up Recommendations None   [facility] IP FREQUENCY AND DURATION 12/05/2016 Speech Therapy Frequency (ACUTE ONLY) min 3x week Treatment Duration 2 weeks      CH[facility] IP ORAL PHASE 12/11/2016 Oral Phase WFL Oral - Pudding Teaspoon -- Oral - Pudding Cup -- Oral - Honey Teaspoon -- Oral - Honey Cup -- Oral - Nectar Teaspoon -- Oral - Nectar Cup -- Oral - Nectar Straw -- Oral - Thin Teaspoon -- Oral - Thin Cup -- Oral - Thin Straw -- Oral - Puree -- Oral - Mech Soft -- Oral - Regular -- Oral - Multi-Consistency -- Oral - Pill -- Oral Phase - Comment --  CH[facility] IP PHARYNGEAL PHASE 12/11/2016 Pharyngeal Phase Impaired Pharyngeal- Pudding Teaspoon -- Pharyngeal -- Pharyngeal- Pudding Cup -- Pharyngeal -- Pharyngeal- Honey Teaspoon NT Pharyngeal -- Pharyngeal- Honey Cup -- Pharyngeal -- Pharyngeal- Nectar Teaspoon NT Pharyngeal -- Pharyngeal- Nectar Cup NT Pharyngeal -- Pharyngeal- Nectar Straw -- Pharyngeal -- Pharyngeal- Thin Teaspoon NT Pharyngeal -- Pharyngeal- Thin Cup Pharyngeal residue - posterior pharnyx;Pharyngeal residue - cp segment;Other (Comment) Pharyngeal Material does not enter airway;Material enters airway, remains ABOVE vocal cords then ejected out Pharyngeal- Thin Straw Other (Comment);Pharyngeal residue - posterior pharnyx;Pharyngeal residue - cp segment Pharyngeal Material does not enter airway;Material enters airway, remains ABOVE vocal cords then ejected out Pharyngeal- Puree NT Pharyngeal -- Pharyngeal- Mechanical Soft NT Pharyngeal -- Pharyngeal- Regular Pharyngeal residue - posterior pharnyx;Pharyngeal residue - cp segment;Other (Comment) Pharyngeal -- Pharyngeal- Multi-consistency -- Pharyngeal -- Pharyngeal- Pill WFL;Other (Comment) Pharyngeal -- Pharyngeal Comment --  [facility] IP CERVICAL ESOPHAGEAL PHASE 12/11/2016 Cervical Esophageal Phase Impaired Pudding Teaspoon -- Pudding Cup  -- Honey Teaspoon -- Honey Cup -- Nectar Teaspoon -- Nectar Cup -- Nectar Straw -- Thin Teaspoon -- Thin Cup -- Thin Straw -- Puree -- Mechanical Soft -- Regular -- Multi-consistency -- Pill -- Cervical Esophageal Comment -- No flowsheet data found. 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 The patient has a slow recovery after her surgery and she spent 45 days in the hospital. She still recovering from her surgery. Her recent CT scan of the chest showed no evidence for disease recurrence. I had a lengthy discussion with the patient and her husband today about her condition and treatment options. I explained to the patient that standard of care for stage IIa non-small cell lung cancer would be consideration of adjuvant systemic chemotherapy but unfortunately with her slow recovery from the surgery  she may not be a good candidate for adjuvant treatment at this point. I recommended for her to continue on observation and close monitoring. I will see her back for follow-up visit in 3 months for reevaluation with repeat CT scan of the chest for restaging of her disease. The patient and her husband agreed to the current plan. For hypertension, the patient will continue with her current blood pressure medication and she will monitor her blood pressure closely at home. She was advised to call immediately if she has any concerning symptoms in the interval. The patient voices understanding of current disease status and treatment options and is in agreement with the current care plan.  All questions were answered. The patient knows to call the clinic with any problems,  questions or concerns. We can certainly see the patient much sooner if necessary.  I spent 15 minutes counseling the patient face to face. The total time spent in the appointment was 25 minutes.  Disclaimer: This note was dictated with voice recognition software. Similar sounding words can inadvertently be transcribed and may not be corrected upon review.

## 2017-01-08 NOTE — Anesthesia Preprocedure Evaluation (Addendum)
Anesthesia Evaluation  Patient identified by MRN, date of birth, ID band Patient awake    Reviewed: Allergy & Precautions, NPO status , Patient's Chart, lab work & pertinent test results  History of Anesthesia Complications (+) PONV  Airway Mallampati: II  TM Distance: >3 FB     Dental   Pulmonary shortness of breath, asthma , pneumonia, COPD, former smoker,     + decreased breath sounds      Cardiovascular hypertension, + dysrhythmias  Rhythm:Regular Rate:Normal     Neuro/Psych    GI/Hepatic   Endo/Other    Renal/GU Renal disease     Musculoskeletal  (+) Arthritis ,   Abdominal   Peds  Hematology  (+) anemia ,   Anesthesia Other Findings   Reproductive/Obstetrics                            Anesthesia Physical Anesthesia Plan  ASA: III  Anesthesia Plan: General   Post-op Pain Management:    Induction: Intravenous  Airway Management Planned: Oral ETT  Additional Equipment:   Intra-op Plan:   Post-operative Plan: Possible Post-op intubation/ventilation  Informed Consent: I have reviewed the patients History and Physical, chart, labs and discussed the procedure including the risks, benefits and alternatives for the proposed anesthesia with the patient or authorized representative who has indicated his/her understanding and acceptance.   Dental advisory given  Plan Discussed with: CRNA and Anesthesiologist  Anesthesia Plan Comments:        Anesthesia Quick Evaluation

## 2017-01-08 NOTE — Telephone Encounter (Signed)
Gave patient AVS and calender per 01/08/2017 los. [facility] to contact patient with CT schedule.

## 2017-01-08 NOTE — Pre-Procedure Instructions (Signed)
    [name]  01/08/2017    Your procedure is scheduled on Wednesday, March 28  Report to [facility] Admitting at 5:30 AM              Your surgery or procedure is scheduled for 7:30 AM   Call this number if you have problems the morning of surgery:[phone]               For any other questions, please call [phone], Monday - Friday 8 AM - 4 PM.     Remember:  Do not eat food or drink liquids after midnight.  Take these medicines the morning of surgery with A SIP OF WATER:amiodarone (PACERONE), loratadine (CLARITIN), pantoprazole (PROTONIX).  May use Flonase, Albuterol Nebulizer or Inhaler if needed.  Please bring Albuterol inhaler to the hospital with you.   Do not wear jewelry, make-up or nail polish.  Do not wear lotions, powders, or perfumes, or deodorant.  Do not shave 48 hours prior to surgery.  Men may shave face and neck.  Do not bring valuables to the hospital.  [facility] is not responsible for any belongings or valuables.  Contacts, dentures or bridgework may not be worn into surgery.  Leave your suitcase in the car.  After surgery it may be brought to your room.  For patients admitted to the hospital, discharge time will be determined by your treatment team.  Patients discharged the day of surgery will not be allowed to drive home.   Special instructions: Review  [facility] - Preparing For Surgery.  Please read over the following fact sheets that you were given: [facility]- Preparing For Surgery and Patient Instructions for Mupirocin Application, Coughinng and Deep Breathing, Pain Booklet, Surgical Site Infection

## 2017-01-09 ENCOUNTER — Encounter (HOSPITAL_COMMUNITY): Payer: Self-pay | Admitting: *Deleted

## 2017-01-09 ENCOUNTER — Ambulatory Visit (HOSPITAL_COMMUNITY)
Admission: RE | Admit: 2017-01-09 | Discharge: 2017-01-09 | Disposition: A | Discharge status: Home / Self Care | Payer: Medicare Other | Source: Ambulatory Visit | Attending: Cardiothoracic Surgery | Admitting: Cardiothoracic Surgery

## 2017-01-09 ENCOUNTER — Ambulatory Visit (HOSPITAL_COMMUNITY): Payer: Medicare Other

## 2017-01-09 ENCOUNTER — Encounter (HOSPITAL_COMMUNITY)
Admission: RE | Disposition: A | Discharge status: Home / Self Care | Payer: Self-pay | Source: Ambulatory Visit | Attending: Cardiothoracic Surgery

## 2017-01-09 ENCOUNTER — Ambulatory Visit (HOSPITAL_COMMUNITY): Payer: Medicare Other | Admitting: Certified Registered Nurse Anesthetist

## 2017-01-09 DIAGNOSIS — C3411 Malignant neoplasm of upper lobe, right bronchus or lung: Secondary | ICD-10-CM | POA: Insufficient documentation

## 2017-01-09 DIAGNOSIS — E785 Hyperlipidemia, unspecified: Secondary | ICD-10-CM | POA: Diagnosis not present

## 2017-01-09 DIAGNOSIS — Z01818 Encounter for other preprocedural examination: Secondary | ICD-10-CM | POA: Insufficient documentation

## 2017-01-09 DIAGNOSIS — Y838 Other surgical procedures as the cause of abnormal reaction of the patient, or of later complication, without mention of misadventure at the time of the procedure: Secondary | ICD-10-CM | POA: Insufficient documentation

## 2017-01-09 DIAGNOSIS — Z09 Encounter for follow-up examination after completed treatment for conditions other than malignant neoplasm: Secondary | ICD-10-CM

## 2017-01-09 DIAGNOSIS — J9382 Other air leak: Secondary | ICD-10-CM | POA: Diagnosis not present

## 2017-01-09 DIAGNOSIS — J95812 Postprocedural air leak: Secondary | ICD-10-CM | POA: Diagnosis not present

## 2017-01-09 DIAGNOSIS — Z902 Acquired absence of lung [part of]: Secondary | ICD-10-CM

## 2017-01-09 DIAGNOSIS — IMO0002 Reserved for concepts with insufficient information to code with codable children: Secondary | ICD-10-CM

## 2017-01-09 DIAGNOSIS — J449 Chronic obstructive pulmonary disease, unspecified: Secondary | ICD-10-CM | POA: Diagnosis not present

## 2017-01-09 DIAGNOSIS — Z87891 Personal history of nicotine dependence: Secondary | ICD-10-CM | POA: Diagnosis not present

## 2017-01-09 DIAGNOSIS — J9690 Respiratory failure, unspecified, unspecified whether with hypoxia or hypercapnia: Secondary | ICD-10-CM | POA: Diagnosis not present

## 2017-01-09 DIAGNOSIS — Z96641 Presence of right artificial hip joint: Secondary | ICD-10-CM | POA: Insufficient documentation

## 2017-01-09 DIAGNOSIS — I10 Essential (primary) hypertension: Secondary | ICD-10-CM | POA: Diagnosis not present

## 2017-01-09 DIAGNOSIS — G2581 Restless legs syndrome: Secondary | ICD-10-CM | POA: Insufficient documentation

## 2017-01-09 DIAGNOSIS — C3490 Malignant neoplasm of unspecified part of unspecified bronchus or lung: Secondary | ICD-10-CM | POA: Diagnosis not present

## 2017-01-09 DIAGNOSIS — R918 Other nonspecific abnormal finding of lung field: Secondary | ICD-10-CM | POA: Diagnosis not present

## 2017-01-09 DIAGNOSIS — F419 Anxiety disorder, unspecified: Secondary | ICD-10-CM | POA: Insufficient documentation

## 2017-01-09 DIAGNOSIS — A419 Sepsis, unspecified organism: Secondary | ICD-10-CM | POA: Diagnosis not present

## 2017-01-09 DIAGNOSIS — J95821 Acute postprocedural respiratory failure: Secondary | ICD-10-CM

## 2017-01-09 HISTORY — DX: Cardiac arrhythmia, unspecified: I49.9

## 2017-01-09 HISTORY — DX: Acute embolism and thrombosis of unspecified deep veins of unspecified lower extremity: I82.409

## 2017-01-09 HISTORY — DX: Anxiety disorder, unspecified: F41.9

## 2017-01-09 HISTORY — PX: VIDEO BRONCHOSCOPY WITH INSERTION OF INTERBRONCHIAL VALVE (IBV): SHX6178

## 2017-01-09 SURGERY — BRONCHOSCOPY, FLEXIBLE, WITH INTRABRONCHIAL VALVE INSERTION
Anesthesia: General

## 2017-01-09 MED ORDER — DEXAMETHASONE SODIUM PHOSPHATE 10 MG/ML IJ SOLN
INTRAMUSCULAR | Status: DC | PRN
  Administered 2017-01-09: 10 mg via INTRAVENOUS

## 2017-01-09 MED ORDER — HYDROMORPHONE HCL 1 MG/ML IJ SOLN: 0.2500 mg | INTRAMUSCULAR | Status: DC | PRN

## 2017-01-09 MED ORDER — FENTANYL CITRATE (PF) 250 MCG/5ML IJ SOLN
INTRAMUSCULAR | Status: AC
Start: 2017-01-09 — End: 2017-01-09
  Filled 2017-01-09: qty 5

## 2017-01-09 MED ORDER — EPINEPHRINE PF 1 MG/ML IJ SOLN
INTRAMUSCULAR | Status: AC
  Filled 2017-01-09: qty 1

## 2017-01-09 MED ORDER — FENTANYL CITRATE (PF) 100 MCG/2ML IJ SOLN
INTRAMUSCULAR | Status: DC | PRN
  Administered 2017-01-09: 100 ug via INTRAVENOUS

## 2017-01-09 MED ORDER — SUCCINYLCHOLINE CHLORIDE 200 MG/10ML IV SOSY
PREFILLED_SYRINGE | INTRAVENOUS | Status: AC
  Filled 2017-01-09: qty 10

## 2017-01-09 MED ORDER — ROCURONIUM BROMIDE 50 MG/5ML IV SOSY
PREFILLED_SYRINGE | INTRAVENOUS | Status: AC
  Filled 2017-01-09: qty 5

## 2017-01-09 MED ORDER — LIDOCAINE 2% (20 MG/ML) 5 ML SYRINGE
INTRAMUSCULAR | Status: DC | PRN
  Administered 2017-01-09: 60 mg via INTRAVENOUS

## 2017-01-09 MED ORDER — LACTATED RINGERS IV SOLN
INTRAVENOUS | Status: DC | PRN
  Administered 2017-01-09: 08:00:00 via INTRAVENOUS

## 2017-01-09 MED ORDER — LIDOCAINE 2% (20 MG/ML) 5 ML SYRINGE
INTRAMUSCULAR | Status: AC
  Filled 2017-01-09: qty 5

## 2017-01-09 MED ORDER — PHENYLEPHRINE HCL 10 MG/ML IJ SOLN
INTRAVENOUS | Status: DC | PRN
  Administered 2017-01-09: 25 ug/min via INTRAVENOUS

## 2017-01-09 MED ORDER — PHENYLEPHRINE 40 MCG/ML (10ML) SYRINGE FOR IV PUSH (FOR BLOOD PRESSURE SUPPORT)
PREFILLED_SYRINGE | INTRAVENOUS | Status: AC
  Filled 2017-01-09: qty 10

## 2017-01-09 MED ORDER — ROCURONIUM BROMIDE 100 MG/10ML IV SOLN
INTRAVENOUS | Status: DC | PRN
  Administered 2017-01-09: 50 mg via INTRAVENOUS

## 2017-01-09 MED ORDER — ONDANSETRON HCL 4 MG/2ML IJ SOLN
INTRAMUSCULAR | Status: DC | PRN
  Administered 2017-01-09: 4 mg via INTRAVENOUS

## 2017-01-09 MED ORDER — SUGAMMADEX SODIUM 200 MG/2ML IV SOLN
INTRAVENOUS | Status: DC | PRN
  Administered 2017-01-09: 120 mg via INTRAVENOUS

## 2017-01-09 MED ORDER — SUGAMMADEX SODIUM 200 MG/2ML IV SOLN
INTRAVENOUS | Status: AC
  Filled 2017-01-09: qty 2

## 2017-01-09 MED ORDER — DEXAMETHASONE SODIUM PHOSPHATE 10 MG/ML IJ SOLN
INTRAMUSCULAR | Status: AC
  Filled 2017-01-09: qty 1

## 2017-01-09 MED ORDER — PROPOFOL 10 MG/ML IV BOLUS
INTRAVENOUS | Status: DC | PRN
  Administered 2017-01-09: 150 mg via INTRAVENOUS

## 2017-01-09 MED ORDER — EPHEDRINE 5 MG/ML INJ
INTRAVENOUS | Status: AC
  Filled 2017-01-09: qty 10

## 2017-01-09 MED ORDER — 0.9 % SODIUM CHLORIDE (POUR BTL) OPTIME
TOPICAL | Status: DC | PRN
  Administered 2017-01-09: 1000 mL

## 2017-01-09 MED ORDER — PROPOFOL 10 MG/ML IV BOLUS
INTRAVENOUS | Status: AC
  Filled 2017-01-09: qty 20

## 2017-01-09 MED ORDER — ONDANSETRON HCL 4 MG/2ML IJ SOLN
INTRAMUSCULAR | Status: AC
  Filled 2017-01-09: qty 2

## 2017-01-09 SURGICAL SUPPLY — 29 items
CANISTER SUCT 3000ML PPV (MISCELLANEOUS) ×2 IMPLANT
CATH BALLN 4FR (CATHETERS) ×2 IMPLANT
CATH EMB 4FR 80CM (CATHETERS) IMPLANT
CATH EMB 5FR 80CM (CATHETERS) IMPLANT
CONT SPEC 4OZ CLIKSEAL STRL BL (MISCELLANEOUS) ×2 IMPLANT
COVER BACK TABLE 60X90IN (DRAPES) ×2 IMPLANT
FILTER STRAW FLUID ASPIR (MISCELLANEOUS) IMPLANT
FORCEPS BIOP RJ4 1.8 (CUTTING FORCEPS) IMPLANT
FORCEPS RADIAL JAW LRG 4 PULM (INSTRUMENTS) ×1 IMPLANT
GAUZE SPONGE 4X4 12PLY STRL (GAUZE/BANDAGES/DRESSINGS) ×2 IMPLANT
GLOVE BIO SURGEON STRL SZ 6.5 (GLOVE) ×2 IMPLANT
GLOVE BIOGEL PI IND STRL 6.5 (GLOVE) IMPLANT
GLOVE BIOGEL PI INDICATOR 6.5 (GLOVE) ×1
GOWN STRL REUS W/ TWL LRG LVL3 (GOWN DISPOSABLE) ×2 IMPLANT
GOWN STRL REUS W/TWL LRG LVL3 (GOWN DISPOSABLE) ×4
KIT CLEAN ENDO COMPLIANCE (KITS) ×2 IMPLANT
KIT ROOM TURNOVER OR (KITS) ×2 IMPLANT
MARKER SKIN DUAL TIP RULER LAB (MISCELLANEOUS) ×2 IMPLANT
NS IRRIG 1000ML POUR BTL (IV SOLUTION) ×2 IMPLANT
OIL SILICONE PENTAX (PARTS (SERVICE/REPAIRS)) ×2 IMPLANT
PAD ARMBOARD 7.5X6 YLW CONV (MISCELLANEOUS) ×4 IMPLANT
RADIAL JAW LRG 4 PULMONARY (INSTRUMENTS) ×1
STOPCOCK MORSE 400PSI 3WAY (MISCELLANEOUS) ×2 IMPLANT
SYR 10ML LL (SYRINGE) ×2 IMPLANT
SYR 20ML ECCENTRIC (SYRINGE) ×2 IMPLANT
SYR 5ML LL (SYRINGE) ×2 IMPLANT
TOWEL OR 17X24 6PK STRL BLUE (TOWEL DISPOSABLE) ×2 IMPLANT
TRAP SPECIMEN MUCOUS 40CC (MISCELLANEOUS) ×3 IMPLANT
TUBE CONNECTING 20X1/4 (TUBING) ×2 IMPLANT

## 2017-01-09 NOTE — Transfer of Care (Signed)
Immediate Anesthesia Transfer of Care Note  Patient: [name]  Procedure(s) Performed: Procedure(s): VIDEO BRONCHOSCOPY WITH Removal OF INTERBRONCHIAL VALVE (IBV) x 3 (N/A)  Patient Location: PACU  Anesthesia Type:General  Level of Consciousness: awake, alert  and oriented  Airway & Oxygen Therapy: Patient Spontanous Breathing and Patient connected to nasal cannula oxygen  Post-op Assessment: Report given to RN, Post -op Vital signs reviewed and stable and Patient moving all extremities  Post vital signs: Reviewed and stable  Last Vitals:  Vitals:   01/09/17 0546 01/09/17 0828  BP: (!) 184/87   Pulse: 79   Resp: 18   Temp: 36.6 C (P) 36.9 C    Last Pain:  Vitals:   01/09/17 0546  TempSrc: Oral  PainSc:          Complications: No apparent anesthesia complications

## 2017-01-09 NOTE — H&P (Signed)
[address]       [place] [address]             [phone]                    [name] Record #[id number] Date of Birth: [birth year]  Referring: No ref. provider found Primary Care: Hannah Lyons, MD  Chief Complaint:   No chief complaint on file.   History of Present Illness:    [name] 71 y.o. female comes in today fro removal of bronchial valves x3 placed after prolonged ari leak at time of right upper lobectomy for non small cell lung cancer. Cancer Staging Malignant neoplasm of upper lobe of right lung [facility]) Staging form: Lung, AJCC 8th Edition - Pathologic stage from 10/23/2016: Stage IIA (pT2b, pN0, cM0) - Signed by Hannah Isaac, MD on 10/30/2016      Current Activity/ Functional Status:  Patient is independent with mobility/ambulation, transfers, ADL's, IADL's.   Zubrod Score: At the time of surgery this patient's most appropriate activity status/level should be described as: '[]'$     0    Normal activity, no symptoms '[x]'$     1    Restricted in physical strenuous activity but ambulatory, able to do out light work '[]'$     2    Ambulatory and capable of self care, unable to do work activities, up and about               >50 % of waking hours                              '[]'$     3    Only limited self care, in bed greater than 50% of waking hours '[]'$     4    Completely disabled, no self care, confined to bed or chair '[]'$     5    Moribund   Past Medical History:  Diagnosis Date  . Anxiety    panic attacks when she becomes short of breath- uses nebulizer prior to bedtime  . Arthritis   . Asthma    asthmatic bronchitis  . COPD (chronic obstructive pulmonary disease) ([place])   . DVT (deep venous thrombosis) ([place])   . Dyspnea    with exetrtion  . Dysrhythmia    Afib - while in hospital wwith sepsis  . History of kidney stones   . Hyperlipidemia   . Hypertension   . Pneumonia    years  . PONV (postoperative  nausea and vomiting)    none with her hip surgery.  Has decreased concentration and abliity for 2- 3 weeks after anesthesai.  [name] Restless leg syndrome   . 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 ABDOMINAL HYSTERECTOMY    . BONE DENSIDEXAY  06/02/2013  . CARDIAC CATHETERIZATION    . COLONOSCOPY  08/05/2015  . CYST EXCISION     from back  . CYSTO    . DENTAL EXAMINATION UNDER ANESTHESIA    . DIAGNOSTIC MAMMOGRAM  07/28/2016  . EYE SURGERY Left    cataract surgery with lens implant  . LYMPH NODE DISSECTION Right 10/29/2016   Procedure: LYMPH NODE DISSECTION;  Surgeon: Hannah Isaac, MD;  Location: [place];  Service: Thoracic;  Laterality: Right;  . TONSILLECTOMY    .  TOTAL HIP ARTHROPLASTY  10/31/2012   Procedure: TOTAL Right HIP ARTHROPLASTY ANTERIOR APPROACH;  Surgeon: Hannah Rossetti, MD;  Location: [facility];  Service: Orthopedics;  Laterality: Right;  Right Total Hip Replacement  . VIDEO ASSISTED THORACOSCOPY (VATS)/WEDGE RESECTION Right 10/29/2016   Procedure: RIGHT  VIDEO ASSISTED THORACOSCOPY /RIGHT UPPER LOBE LUNG RESECTION and Right Thoracotomy;  Surgeon: Hannah Isaac, MD;  Location: [place];  Service: Thoracic;  Laterality: Right;  [name] N/A 10/29/2016   Procedure: VIDEO BRONCHOSCOPY;  Surgeon: Hannah Isaac, MD;  Location: [place];  Service: Thoracic;  Laterality: N/A;  . VIDEO BRONCHOSCOPY WITH ENDOBRONCHIAL NAVIGATION N/A 08/29/2016   Procedure: VIDEO BRONCHOSCOPY WITH ENDOBRONCHIAL NAVIGATION;  Surgeon: Hannah Gobble, MD;  Location: [place];  Service: Thoracic;  Laterality: N/A;  . VIDEO BRONCHOSCOPY WITH INSERTION OF INTERBRONCHIAL VALVE (IBV) N/A 11/05/2016   Procedure: VIDEO BRONCHOSCOPY WITH INSERTION OF INTERBRONCHIAL VALVE (IBV) times three;  Surgeon: Hannah Isaac, MD;  Location: [facility];  Service: Thoracic;  Laterality: N/A;    History reviewed. No pertinent family  history.  Social History   Social History  . Marital status: Married    Spouse name: N/A  . Number of children: N/A  . Years of education: N/A   Occupational History  . Not on file.   Social History Main Topics  . Smoking status: Former Smoker    Packs/day: 0.00    Years: 45.00    Quit date: 08/29/2016  . Smokeless tobacco: Never Used  . Alcohol use No  . Drug use: No  . Sexual activity: Not on file   Other Topics Concern  . Not on file   Social History Narrative  . No narrative on file    History  Smoking Status  . Former Smoker  . Packs/day: 0.00  . Years: 45.00  . Quit date: 08/29/2016  Smokeless Tobacco  . Never Used    History  Alcohol Use No     Allergies  Allergen Reactions  . Nickel Rash    No current facility-administered medications for this encounter.      Review of Systems:     Cardiac Review of Systems: Y or N  Chest Pain [ n   ]  Resting SOB [n   ] Exertional SOB  [ y ]  Vertell Limber [name]  ]   Pedal Edema [ n  ]    Palpitations [n  ] Syncope  [name]  ]   Presyncope [n   ]  General Review of Systems: [Y] = yes [  ]=no Constitional: recent weight change [  ];  Wt loss over the last 3 months [   ] anorexia [  ]; fatigue [  ]; nausea [  ]; night sweats [  ]; fever [  ]; or chills [  ];          Dental: poor dentition[  ]; Last Dentist visit:   Eye : blurred vision [  ]; diplopia [   ]; vision changes [  ];  Amaurosis fugax[  ]; Resp: cough [  ];  wheezing[  ];  hemoptysis[  ]; shortness of breath[  ]; paroxysmal nocturnal dyspnea[  ]; dyspnea on exertion[  ]; or orthopnea[  ];  GI:  gallstones[  ], vomiting[  ];  dysphagia[  ]; melena[  ];  hematochezia [  ]; heartburn[  ];   Hx of  Colonoscopy[  ]; GU: kidney stones [  ]; hematuria[  ];  dysuria [  ];  nocturia[  ];  history of     obstruction [  ]; urinary frequency [  ]             Skin: rash, swelling[  ];, hair loss[  ];  peripheral edema[  ];  or itching[  ]; Musculosketetal: myalgias[  ];   joint swelling[  ];  joint erythema[  ];  joint pain[  ];  back pain[  ];  Heme/Lymph: bruising[  ];  bleeding[  ];  anemia[  ];  Neuro: TIA[  ];  headaches[  ];  stroke[  ];  vertigo[  ];  seizures[  ];   paresthesias[  ];  difficulty walking[  ];  Psych:depression[  ]; anxiety[  ];  Endocrine: diabetes[  ];  thyroid dysfunction[  ];  Immunizations: Flu up to date [  ]; Pneumococcal up to date [  ];  Other:  Physical Exam: BP (!) 184/87   Pulse 79   Temp 97.8 F (36.6 C) (Oral)   Resp 18   Wt 131 lb (59.4 kg)   SpO2 100%   BMI 22.84 kg/m   PHYSICAL EXAMINATION: General appearance: alert and cooperative Head: Normocephalic, without obvious abnormality, atraumatic Neck: no adenopathy, no carotid bruit, no JVD, supple, symmetrical, trachea midline and thyroid not enlarged, symmetric, no tenderness/mass/nodules Lymph nodes: Cervical, supraclavicular, and axillary nodes normal. Resp: clear to auscultation bilaterally Back: symmetric, no curvature. ROM normal. No CVA tenderness. Cardio: regular rate and rhythm, S1, S2 normal, no murmur, click, rub or gallop GI: soft, non-tender; bowel sounds normal; no masses,  no organomegaly Extremities: extremities normal, atraumatic, no cyanosis or edema and Homans sign is negative, no sign of DVT Neurologic: Grossly normal  Diagnostic Studies & Laboratory data:     Recent Radiology Findings:   Dg Chest 2 View  Result Date: 01/08/2017 CLINICAL DATA:  Preoperative evaluation for bronchial valve removal EXAM: CHEST  2 VIEW COMPARISON:  01/03/2017 FINDINGS: Postoperative changes are again noted on the right. Multiple bronchial valves are again seen and stable. The left lung remains hyperinflated and clear. Cardiac shadow is within normal limits. No acute bony abnormality is noted. IMPRESSION: Postoperative change on the right, stable from the prior exam. Electronically Signed   By: Inez Catalina M.D.   On: 01/08/2017 13:40   Dg Chest 2  View  Result Date: 01/03/2017 CLINICAL DATA:  Shortness of breath, history of lung cancer. EXAM: CHEST  2 VIEW COMPARISON:  Radiographs of December 08, 2016. FINDINGS: Stable cardiomediastinal silhouette. Atherosclerosis of thoracic aorta is noted. No pneumothorax is noted. Stable right apical pleural thickening or scarring is noted. Stable postoperative changes and volume loss are seen involving the right lung and hilar region. Left lung is hyperexpanded. No consolidative process is noted. Bony thorax is unremarkable. IMPRESSION: Stable postoperative changes seen involving the right hemithorax. No acute cardiopulmonary abnormality seen. Electronically Signed   By: Marijo Conception, M.D.   On: 01/03/2017 10:17   Ct Chest Wo Contrast  Result Date: 01/03/2017 CLINICAL DATA:  Status post right upper lobe removal.  Lung cancer. EXAM: CT CHEST WITHOUT CONTRAST TECHNIQUE: Multidetector CT imaging of the chest was performed following the standard protocol without IV contrast. COMPARISON:  08/17/2016 FINDINGS: Cardiovascular: Normal heart size. There is aortic atherosclerosis identified. Calcification within the RCA coronary artery noted. No pericardial effusion. Mediastinum/Nodes: No mediastinal or hilar adenopathy. The trachea appears patent and is midline. Normal appearance of the esophagus. Lungs/Pleura: Moderate-to-marked changes of  centrilobular and paraseptal emphysema. Postoperative changes from right upper lobe resection. There is complete atelectasis of the right middle lobe is noted. Bronchiectasis, architectural distortion and tree in bud nodularity is noted throughout the right lower lobe. Upper Abdomen: No acute abnormality. Multiple areas of scarring identified involving the left kidney. Small stones are identified within the left renal collecting system. Stones and/or sludge noted within the gallbladder. Musculoskeletal: No aggressive lytic or sclerotic bone lesions. IMPRESSION: 1. Postsurgical changes  from right upper lobe lobectomy. 2. Complete atelectasis of the right middle lobe which may reflect postsurgical change. 3. Extensive postinflammatory changes within the right lower lobe including bronchiectasis, architectural distortion and tree-in-bud nodularity. Findings may reflect recurrent aspiration status post surgery and/or chronic infection. Electronically Signed   By: Kerby Moors M.D.   On: 01/03/2017 13:28       I have independently reviewed the above radiologic studies.  Recent Lab Findings: Lab Results  Component Value Date   WBC 7.3 01/08/2017   HGB 11.3 (L) 01/08/2017   HCT 33.4 (L) 01/08/2017   PLT 301 01/08/2017   GLUCOSE 93 01/08/2017   TRIG 137 11/12/2016   ALT 18 01/08/2017   AST 16 01/08/2017   NA 140 01/08/2017   K 3.7 01/08/2017   CL 98 (L) 12/06/2016   CREATININE 1.2 (H) 01/08/2017   BUN 13.8 01/08/2017   CO2 26 01/08/2017   INR 1.29 01/08/2017      Assessment / Plan:      Plan remove of bronchial valves via bronchoscopy  The goals risks and alternatives of the planned surgical procedure Procedure(s): VIDEO BRONCHOSCOPY WITH Removal OF INTERBRONCHIAL VALVE (IBV) x 3 (N/A)  have been discussed with the patient in detail. The risks of the procedure including death, infection, stroke, myocardial infarction, bleeding, blood transfusion have all been discussed specifically.  I have quoted [name] a 1% of perioperative mortality and a complication rate as high as 10 %. The patient's questions have been answered.[name] is willing  to proceed with the planned procedure.  Hannah Isaac MD      [address] [place] [address] Office [phone]   Beeper [phone]  01/09/2017 6:47 AM

## 2017-01-09 NOTE — Anesthesia Procedure Notes (Signed)
Procedure Name: Intubation Date/Time: 01/09/2017 7:40 AM Performed by: Trixie Deis A Pre-anesthesia Checklist: Patient identified, Emergency Drugs available, Suction available and Patient being monitored Patient Re-evaluated:Patient Re-evaluated prior to inductionOxygen Delivery Method: Circle System Utilized Preoxygenation: Pre-oxygenation with 100% oxygen Intubation Type: IV induction Ventilation: Mask ventilation without difficulty Laryngoscope Size: Mac and 3 Grade View: Grade I Tube type: Oral Tube size: 8.5 mm Number of attempts: 1 Airway Equipment and Method: Stylet and Oral airway Placement Confirmation: ETT inserted through vocal cords under direct vision,  positive ETCO2 and breath sounds checked- equal and bilateral Secured at: 21 cm Tube secured with: Tape Dental Injury: Teeth and Oropharynx as per pre-operative assessment

## 2017-01-09 NOTE — Brief Op Note (Signed)
      [address]       [place] [address]             [phone]      01/09/2017  8:21 AM  PATIENT:  [name]  71 y.o. female  PRE-OPERATIVE DIAGNOSIS:  ibv valves  POST-OPERATIVE DIAGNOSIS: same   PROCEDURE:  Procedure(s): VIDEO BRONCHOSCOPY WITH Removal OF INTERBRONCHIAL VALVE (IBV) x 3 (N/A)  SURGEON:  Surgeon(s) and Role:    * [name], MD - Primary   ANESTHESIA:   general  EBL:  No intake/output data recorded.  BLOOD ADMINISTERED:none  DRAINS: none   LOCAL MEDICATIONS USED:  NONE  SPECIMEN:  No Specimen  DISPOSITION OF SPECIMEN:  N/A  COUNTS:  YES  DICTATION: .Dragon Dictation  PLAN OF CARE: Discharge to home after PACU  PATIENT DISPOSITION:  PACU - hemodynamically stable.   Delay start of Pharmacological VTE agent (>24hrs) due to surgical blood loss or risk of bleeding: yes  Photo in media tab

## 2017-01-09 NOTE — Discharge Instructions (Signed)
Flexible Bronchoscopy, Care After °This sheet gives you information about how to care for yourself after your procedure. Your health care provider may also give you more specific instructions. If you have problems or questions, contact your health care provider. °What can I expect after the procedure? °After the procedure, it is common to have the following symptoms for 24-48 hours: °· A cough that is worse than it was before the procedure. °· A low-grade fever. °· A sore throat or hoarse voice. °· Small streaks of blood in the mucus from your lungs (sputum), if tissue samples were removed (biopsy). ° °Follow these instructions at home: °Eating and drinking °· Do not eat or drink anything (including water) for 2 hours after your procedure, or until your numbing medicine (local anesthetic) has worn off. Having a numb throat increases your risk of burning yourself or choking. °· After your numbness is gone and your cough and gag reflexes have returned, you may start eating only soft foods and slowly drinking liquids. °· The day after the procedure, return to your normal diet. °Driving °· Do not drive for 24 hours if you were given a medicine to help you relax (sedative). °· Do not drive or use heavy machinery while taking prescription pain medicine. °General instructions °· Take over-the-counter and prescription medicines only as told by your health care provider. °· Return to your normal activities as told by your health care provider. Ask your health care provider what activities are safe for you. °· Do not use any products that contain nicotine or tobacco, such as cigarettes and e-cigarettes. If you need help quitting, ask your health care provider. °· Keep all follow-up visits as told by your health care provider. This is important, especially if you had a biopsy taken. °Get help right away if: °· You have shortness of breath that gets worse. °· You become light-headed or feel like you might faint. °· You have  chest pain. °· You cough up more than a small amount of blood. °· The amount of blood you cough up increases. °Summary °· Common symptoms in the 24-48 hours following a flexible bronchoscopy include cough, low-grade fever, sore throat or hoarse voice, and blood-streaked mucus from the lungs (if you had a biopsy). °· Do not eat or drink anything (including water) for 2 hours after your procedure, or until your local anesthetic has worn off. You can return to your normal diet the day after the procedure. °· Get help right away if you develop worsening shortness of breath, have chest pain, become light-headed, or cough up more than a small amount of blood. °This information is not intended to replace advice given to you by your health care provider. Make sure you discuss any questions you have with your health care provider. °Document Released: 04/20/2005 Document Revised: 10/19/2016 Document Reviewed: 10/19/2016 °Elsevier Interactive Patient Education © 2017 [employer] Inc[employer] ° °

## 2017-01-09 NOTE — Anesthesia Postprocedure Evaluation (Signed)
Anesthesia Post Note  Patient: [name]  Procedure(s) Performed: Procedure(s) (LRB): VIDEO BRONCHOSCOPY WITH Removal OF INTERBRONCHIAL VALVE (IBV) x 3 (N/A)  Patient location during evaluation: PACU Anesthesia Type: General Level of consciousness: awake Pain management: pain level controlled Vital Signs Assessment: post-procedure vital signs reviewed and stable Respiratory status: spontaneous breathing Cardiovascular status: stable Anesthetic complications: no       Last Vitals:  Vitals:   01/09/17 0930 01/09/17 0939  BP:  (!) 154/91  Pulse: 74 79  Resp: 17   Temp: 36.1 C 36.1 C    Last Pain:  Vitals:   01/09/17 0546  TempSrc: Oral  PainSc:                  Miri Jose

## 2017-01-10 ENCOUNTER — Encounter (HOSPITAL_COMMUNITY): Payer: Self-pay | Admitting: Cardiothoracic Surgery

## 2017-01-10 DIAGNOSIS — C3491 Malignant neoplasm of unspecified part of right bronchus or lung: Secondary | ICD-10-CM | POA: Diagnosis not present

## 2017-01-10 DIAGNOSIS — I97191 Other postprocedural cardiac functional disturbances following other surgery: Secondary | ICD-10-CM | POA: Diagnosis not present

## 2017-01-10 DIAGNOSIS — I82491 Acute embolism and thrombosis of other specified deep vein of right lower extremity: Secondary | ICD-10-CM | POA: Diagnosis not present

## 2017-01-10 DIAGNOSIS — T85898D Other specified complication of other internal prosthetic devices, implants and grafts, subsequent encounter: Secondary | ICD-10-CM | POA: Diagnosis not present

## 2017-01-10 DIAGNOSIS — I4891 Unspecified atrial fibrillation: Secondary | ICD-10-CM | POA: Diagnosis not present

## 2017-01-10 DIAGNOSIS — I82492 Acute embolism and thrombosis of other specified deep vein of left lower extremity: Secondary | ICD-10-CM | POA: Diagnosis not present

## 2017-01-10 NOTE — Op Note (Signed)
NAME:  [name], [name]                      ACCOUNT NO.:  MEDICAL RECORD NO.:  [id number]  LOCATION:                                 FACILITY:  PHYSICIAN:  Lanelle Bal, MD    DATE OF BIRTH:  25-Jul-1946  DATE OF PROCEDURE:  01/09/2017 DATE OF DISCHARGE:                              OPERATIVE REPORT   PREOPERATIVE DIAGNOSIS:  Intrabronchial valve replacement after prolonged air leak following right upper lobectomy.  POSTOPERATIVE DIAGNOSIS:  Intrabronchial valve replacement after prolonged air leak following right upper lobectomy.  SURGICAL PROCEDURE:  Bronchoscopy under general anesthesia with removal OF intrabronchial valves x3.  SURGEON:  Lanelle Bal, MD  BRIEF HISTORY:  The patient is a 71 year old female, who in January had undergone right upper lobectomy for 5.5-cm right upper lobe lung mass, non-small cell carcinoma with negative nodes, stage II. Postoperatively, the patient developed a prolonged air leak.  She also had postoperative complications including pneumonia, sepsis, acute renal failure, which she gradually resolved and ultimately was discharged home after 45 days in the hospital.  She had been doing relatively well and comes in for followup now approximately 2 months after placement of bronchial valves for their removal.  Risks and options were discussed with the patient and she agreed and signed informed consent.  DESCRIPTION OF PROCEDURE:  The patient underwent general endotracheal anesthesia without incident.  Appropriate time-out was performed.  We proceeded with fiberoptic bronchoscopy.  The left tracheobronchial tree was examined.  There were no endobronchial lesions.  The right tracheobronchial tree was examined.  The right upper lobe bronchial takeoff was well healed from the previous resection.  We were able to easily locate the bronchial valves; two in the middle lobe and one in the superior segment of the lower lobe.  Each of the three valves  were easily removed with the grasping with biopsy forceps.  There was very minimal bleeding.  The patient tolerated the procedure without obvious complication, and was extubated in the operating room and transferred to the recovery room for postoperative care.     Lanelle Bal, MD     EG/MEDQ  D:  01/09/2017  T:  01/09/2017  Job:  [id number]

## 2017-01-11 DIAGNOSIS — T85898D Other specified complication of other internal prosthetic devices, implants and grafts, subsequent encounter: Secondary | ICD-10-CM | POA: Diagnosis not present

## 2017-01-11 DIAGNOSIS — I82492 Acute embolism and thrombosis of other specified deep vein of left lower extremity: Secondary | ICD-10-CM | POA: Diagnosis not present

## 2017-01-11 DIAGNOSIS — I97191 Other postprocedural cardiac functional disturbances following other surgery: Secondary | ICD-10-CM | POA: Diagnosis not present

## 2017-01-11 DIAGNOSIS — I4891 Unspecified atrial fibrillation: Secondary | ICD-10-CM | POA: Diagnosis not present

## 2017-01-11 DIAGNOSIS — I82491 Acute embolism and thrombosis of other specified deep vein of right lower extremity: Secondary | ICD-10-CM | POA: Diagnosis not present

## 2017-01-11 DIAGNOSIS — C3491 Malignant neoplasm of unspecified part of right bronchus or lung: Secondary | ICD-10-CM | POA: Diagnosis not present

## 2017-01-15 DIAGNOSIS — I97191 Other postprocedural cardiac functional disturbances following other surgery: Secondary | ICD-10-CM | POA: Diagnosis not present

## 2017-01-15 DIAGNOSIS — T85898D Other specified complication of other internal prosthetic devices, implants and grafts, subsequent encounter: Secondary | ICD-10-CM | POA: Diagnosis not present

## 2017-01-15 DIAGNOSIS — I4891 Unspecified atrial fibrillation: Secondary | ICD-10-CM | POA: Diagnosis not present

## 2017-01-15 DIAGNOSIS — I82492 Acute embolism and thrombosis of other specified deep vein of left lower extremity: Secondary | ICD-10-CM | POA: Diagnosis not present

## 2017-01-15 DIAGNOSIS — I82491 Acute embolism and thrombosis of other specified deep vein of right lower extremity: Secondary | ICD-10-CM | POA: Diagnosis not present

## 2017-01-15 DIAGNOSIS — C3491 Malignant neoplasm of unspecified part of right bronchus or lung: Secondary | ICD-10-CM | POA: Diagnosis not present

## 2017-01-16 DIAGNOSIS — I82492 Acute embolism and thrombosis of other specified deep vein of left lower extremity: Secondary | ICD-10-CM | POA: Diagnosis not present

## 2017-01-16 DIAGNOSIS — C3491 Malignant neoplasm of unspecified part of right bronchus or lung: Secondary | ICD-10-CM | POA: Diagnosis not present

## 2017-01-16 DIAGNOSIS — I82491 Acute embolism and thrombosis of other specified deep vein of right lower extremity: Secondary | ICD-10-CM | POA: Diagnosis not present

## 2017-01-16 DIAGNOSIS — T85898D Other specified complication of other internal prosthetic devices, implants and grafts, subsequent encounter: Secondary | ICD-10-CM | POA: Diagnosis not present

## 2017-01-16 DIAGNOSIS — I4891 Unspecified atrial fibrillation: Secondary | ICD-10-CM | POA: Diagnosis not present

## 2017-01-16 DIAGNOSIS — I97191 Other postprocedural cardiac functional disturbances following other surgery: Secondary | ICD-10-CM | POA: Diagnosis not present

## 2017-01-17 DIAGNOSIS — I82492 Acute embolism and thrombosis of other specified deep vein of left lower extremity: Secondary | ICD-10-CM | POA: Diagnosis not present

## 2017-01-17 DIAGNOSIS — I4891 Unspecified atrial fibrillation: Secondary | ICD-10-CM | POA: Diagnosis not present

## 2017-01-17 DIAGNOSIS — T85898D Other specified complication of other internal prosthetic devices, implants and grafts, subsequent encounter: Secondary | ICD-10-CM | POA: Diagnosis not present

## 2017-01-17 DIAGNOSIS — I82491 Acute embolism and thrombosis of other specified deep vein of right lower extremity: Secondary | ICD-10-CM | POA: Diagnosis not present

## 2017-01-17 DIAGNOSIS — C3491 Malignant neoplasm of unspecified part of right bronchus or lung: Secondary | ICD-10-CM | POA: Diagnosis not present

## 2017-01-17 DIAGNOSIS — I97191 Other postprocedural cardiac functional disturbances following other surgery: Secondary | ICD-10-CM | POA: Diagnosis not present

## 2017-01-18 DIAGNOSIS — I82492 Acute embolism and thrombosis of other specified deep vein of left lower extremity: Secondary | ICD-10-CM | POA: Diagnosis not present

## 2017-01-18 DIAGNOSIS — C3491 Malignant neoplasm of unspecified part of right bronchus or lung: Secondary | ICD-10-CM | POA: Diagnosis not present

## 2017-01-18 DIAGNOSIS — I97191 Other postprocedural cardiac functional disturbances following other surgery: Secondary | ICD-10-CM | POA: Diagnosis not present

## 2017-01-18 DIAGNOSIS — T85898D Other specified complication of other internal prosthetic devices, implants and grafts, subsequent encounter: Secondary | ICD-10-CM | POA: Diagnosis not present

## 2017-01-18 DIAGNOSIS — I4891 Unspecified atrial fibrillation: Secondary | ICD-10-CM | POA: Diagnosis not present

## 2017-01-18 DIAGNOSIS — I82491 Acute embolism and thrombosis of other specified deep vein of right lower extremity: Secondary | ICD-10-CM | POA: Diagnosis not present

## 2017-01-21 DIAGNOSIS — I82491 Acute embolism and thrombosis of other specified deep vein of right lower extremity: Secondary | ICD-10-CM | POA: Diagnosis not present

## 2017-01-21 DIAGNOSIS — I4891 Unspecified atrial fibrillation: Secondary | ICD-10-CM | POA: Diagnosis not present

## 2017-01-21 DIAGNOSIS — I82492 Acute embolism and thrombosis of other specified deep vein of left lower extremity: Secondary | ICD-10-CM | POA: Diagnosis not present

## 2017-01-21 DIAGNOSIS — T85898D Other specified complication of other internal prosthetic devices, implants and grafts, subsequent encounter: Secondary | ICD-10-CM | POA: Diagnosis not present

## 2017-01-21 DIAGNOSIS — C3491 Malignant neoplasm of unspecified part of right bronchus or lung: Secondary | ICD-10-CM | POA: Diagnosis not present

## 2017-01-21 DIAGNOSIS — I97191 Other postprocedural cardiac functional disturbances following other surgery: Secondary | ICD-10-CM | POA: Diagnosis not present

## 2017-01-22 DIAGNOSIS — I4891 Unspecified atrial fibrillation: Secondary | ICD-10-CM | POA: Diagnosis not present

## 2017-01-22 DIAGNOSIS — C3491 Malignant neoplasm of unspecified part of right bronchus or lung: Secondary | ICD-10-CM | POA: Diagnosis not present

## 2017-01-22 DIAGNOSIS — I82492 Acute embolism and thrombosis of other specified deep vein of left lower extremity: Secondary | ICD-10-CM | POA: Diagnosis not present

## 2017-01-22 DIAGNOSIS — T85898D Other specified complication of other internal prosthetic devices, implants and grafts, subsequent encounter: Secondary | ICD-10-CM | POA: Diagnosis not present

## 2017-01-22 DIAGNOSIS — I82491 Acute embolism and thrombosis of other specified deep vein of right lower extremity: Secondary | ICD-10-CM | POA: Diagnosis not present

## 2017-01-22 DIAGNOSIS — I97191 Other postprocedural cardiac functional disturbances following other surgery: Secondary | ICD-10-CM | POA: Diagnosis not present

## 2017-01-23 DIAGNOSIS — R928 Other abnormal and inconclusive findings on diagnostic imaging of breast: Secondary | ICD-10-CM | POA: Diagnosis not present

## 2017-01-23 DIAGNOSIS — Z87891 Personal history of nicotine dependence: Secondary | ICD-10-CM | POA: Diagnosis not present

## 2017-01-23 DIAGNOSIS — I4891 Unspecified atrial fibrillation: Secondary | ICD-10-CM | POA: Diagnosis not present

## 2017-01-23 DIAGNOSIS — R5381 Other malaise: Secondary | ICD-10-CM | POA: Diagnosis not present

## 2017-01-23 DIAGNOSIS — Z902 Acquired absence of lung [part of]: Secondary | ICD-10-CM | POA: Diagnosis not present

## 2017-01-23 DIAGNOSIS — I8290 Acute embolism and thrombosis of unspecified vein: Secondary | ICD-10-CM | POA: Diagnosis not present

## 2017-01-23 DIAGNOSIS — R918 Other nonspecific abnormal finding of lung field: Secondary | ICD-10-CM | POA: Diagnosis not present

## 2017-01-23 DIAGNOSIS — Z1389 Encounter for screening for other disorder: Secondary | ICD-10-CM | POA: Diagnosis not present

## 2017-01-23 DIAGNOSIS — C3490 Malignant neoplasm of unspecified part of unspecified bronchus or lung: Secondary | ICD-10-CM | POA: Diagnosis not present

## 2017-01-23 DIAGNOSIS — M549 Dorsalgia, unspecified: Secondary | ICD-10-CM | POA: Diagnosis not present

## 2017-01-23 DIAGNOSIS — Z6823 Body mass index (BMI) 23.0-23.9, adult: Secondary | ICD-10-CM | POA: Diagnosis not present

## 2017-01-23 DIAGNOSIS — J449 Chronic obstructive pulmonary disease, unspecified: Secondary | ICD-10-CM | POA: Diagnosis not present

## 2017-01-24 DIAGNOSIS — I97191 Other postprocedural cardiac functional disturbances following other surgery: Secondary | ICD-10-CM | POA: Diagnosis not present

## 2017-01-24 DIAGNOSIS — T85898D Other specified complication of other internal prosthetic devices, implants and grafts, subsequent encounter: Secondary | ICD-10-CM | POA: Diagnosis not present

## 2017-01-24 DIAGNOSIS — I82491 Acute embolism and thrombosis of other specified deep vein of right lower extremity: Secondary | ICD-10-CM | POA: Diagnosis not present

## 2017-01-24 DIAGNOSIS — I4891 Unspecified atrial fibrillation: Secondary | ICD-10-CM | POA: Diagnosis not present

## 2017-01-24 DIAGNOSIS — I82492 Acute embolism and thrombosis of other specified deep vein of left lower extremity: Secondary | ICD-10-CM | POA: Diagnosis not present

## 2017-01-24 DIAGNOSIS — C3491 Malignant neoplasm of unspecified part of right bronchus or lung: Secondary | ICD-10-CM | POA: Diagnosis not present

## 2017-01-30 DIAGNOSIS — Z7901 Long term (current) use of anticoagulants: Secondary | ICD-10-CM | POA: Diagnosis not present

## 2017-01-30 DIAGNOSIS — I4891 Unspecified atrial fibrillation: Secondary | ICD-10-CM | POA: Diagnosis not present

## 2017-01-30 DIAGNOSIS — I8290 Acute embolism and thrombosis of unspecified vein: Secondary | ICD-10-CM | POA: Diagnosis not present

## 2017-02-06 DIAGNOSIS — M9900 Segmental and somatic dysfunction of head region: Secondary | ICD-10-CM | POA: Diagnosis not present

## 2017-02-06 DIAGNOSIS — M5010 Cervical disc disorder with radiculopathy, unspecified cervical region: Secondary | ICD-10-CM | POA: Diagnosis not present

## 2017-02-06 DIAGNOSIS — M546 Pain in thoracic spine: Secondary | ICD-10-CM | POA: Diagnosis not present

## 2017-02-06 DIAGNOSIS — M9901 Segmental and somatic dysfunction of cervical region: Secondary | ICD-10-CM | POA: Diagnosis not present

## 2017-02-06 DIAGNOSIS — R51 Headache: Secondary | ICD-10-CM | POA: Diagnosis not present

## 2017-02-06 DIAGNOSIS — M9902 Segmental and somatic dysfunction of thoracic region: Secondary | ICD-10-CM | POA: Diagnosis not present

## 2017-02-06 DIAGNOSIS — M542 Cervicalgia: Secondary | ICD-10-CM | POA: Diagnosis not present

## 2017-02-11 DIAGNOSIS — I4891 Unspecified atrial fibrillation: Secondary | ICD-10-CM | POA: Diagnosis not present

## 2017-02-11 DIAGNOSIS — I8290 Acute embolism and thrombosis of unspecified vein: Secondary | ICD-10-CM | POA: Diagnosis not present

## 2017-02-11 DIAGNOSIS — Z7901 Long term (current) use of anticoagulants: Secondary | ICD-10-CM | POA: Diagnosis not present

## 2017-02-12 DIAGNOSIS — M9902 Segmental and somatic dysfunction of thoracic region: Secondary | ICD-10-CM | POA: Diagnosis not present

## 2017-02-12 DIAGNOSIS — M9900 Segmental and somatic dysfunction of head region: Secondary | ICD-10-CM | POA: Diagnosis not present

## 2017-02-12 DIAGNOSIS — M546 Pain in thoracic spine: Secondary | ICD-10-CM | POA: Diagnosis not present

## 2017-02-12 DIAGNOSIS — R51 Headache: Secondary | ICD-10-CM | POA: Diagnosis not present

## 2017-02-12 DIAGNOSIS — M542 Cervicalgia: Secondary | ICD-10-CM | POA: Diagnosis not present

## 2017-02-12 DIAGNOSIS — M9901 Segmental and somatic dysfunction of cervical region: Secondary | ICD-10-CM | POA: Diagnosis not present

## 2017-02-12 DIAGNOSIS — M5010 Cervical disc disorder with radiculopathy, unspecified cervical region: Secondary | ICD-10-CM | POA: Diagnosis not present

## 2017-02-19 DIAGNOSIS — R51 Headache: Secondary | ICD-10-CM | POA: Diagnosis not present

## 2017-02-19 DIAGNOSIS — M5010 Cervical disc disorder with radiculopathy, unspecified cervical region: Secondary | ICD-10-CM | POA: Diagnosis not present

## 2017-02-19 DIAGNOSIS — M9901 Segmental and somatic dysfunction of cervical region: Secondary | ICD-10-CM | POA: Diagnosis not present

## 2017-02-19 DIAGNOSIS — M546 Pain in thoracic spine: Secondary | ICD-10-CM | POA: Diagnosis not present

## 2017-02-19 DIAGNOSIS — M9902 Segmental and somatic dysfunction of thoracic region: Secondary | ICD-10-CM | POA: Diagnosis not present

## 2017-02-19 DIAGNOSIS — M9900 Segmental and somatic dysfunction of head region: Secondary | ICD-10-CM | POA: Diagnosis not present

## 2017-02-19 DIAGNOSIS — M542 Cervicalgia: Secondary | ICD-10-CM | POA: Diagnosis not present

## 2017-02-26 DIAGNOSIS — M9901 Segmental and somatic dysfunction of cervical region: Secondary | ICD-10-CM | POA: Diagnosis not present

## 2017-02-26 DIAGNOSIS — M546 Pain in thoracic spine: Secondary | ICD-10-CM | POA: Diagnosis not present

## 2017-02-26 DIAGNOSIS — M9900 Segmental and somatic dysfunction of head region: Secondary | ICD-10-CM | POA: Diagnosis not present

## 2017-02-26 DIAGNOSIS — M9902 Segmental and somatic dysfunction of thoracic region: Secondary | ICD-10-CM | POA: Diagnosis not present

## 2017-02-26 DIAGNOSIS — M542 Cervicalgia: Secondary | ICD-10-CM | POA: Diagnosis not present

## 2017-02-26 DIAGNOSIS — R51 Headache: Secondary | ICD-10-CM | POA: Diagnosis not present

## 2017-02-26 DIAGNOSIS — M5010 Cervical disc disorder with radiculopathy, unspecified cervical region: Secondary | ICD-10-CM | POA: Diagnosis not present

## 2017-03-04 DIAGNOSIS — M546 Pain in thoracic spine: Secondary | ICD-10-CM | POA: Diagnosis not present

## 2017-03-04 DIAGNOSIS — I480 Paroxysmal atrial fibrillation: Secondary | ICD-10-CM | POA: Diagnosis not present

## 2017-03-04 DIAGNOSIS — M9902 Segmental and somatic dysfunction of thoracic region: Secondary | ICD-10-CM | POA: Diagnosis not present

## 2017-03-04 DIAGNOSIS — Z87891 Personal history of nicotine dependence: Secondary | ICD-10-CM | POA: Diagnosis not present

## 2017-03-04 DIAGNOSIS — I8290 Acute embolism and thrombosis of unspecified vein: Secondary | ICD-10-CM | POA: Diagnosis not present

## 2017-03-04 DIAGNOSIS — Z7901 Long term (current) use of anticoagulants: Secondary | ICD-10-CM | POA: Diagnosis not present

## 2017-03-04 DIAGNOSIS — I4891 Unspecified atrial fibrillation: Secondary | ICD-10-CM | POA: Diagnosis not present

## 2017-03-04 DIAGNOSIS — Z9889 Other specified postprocedural states: Secondary | ICD-10-CM | POA: Diagnosis not present

## 2017-03-04 DIAGNOSIS — R51 Headache: Secondary | ICD-10-CM | POA: Diagnosis not present

## 2017-03-04 DIAGNOSIS — M5010 Cervical disc disorder with radiculopathy, unspecified cervical region: Secondary | ICD-10-CM | POA: Diagnosis not present

## 2017-03-04 DIAGNOSIS — M9900 Segmental and somatic dysfunction of head region: Secondary | ICD-10-CM | POA: Diagnosis not present

## 2017-03-04 DIAGNOSIS — M542 Cervicalgia: Secondary | ICD-10-CM | POA: Diagnosis not present

## 2017-03-04 DIAGNOSIS — I10 Essential (primary) hypertension: Secondary | ICD-10-CM | POA: Diagnosis not present

## 2017-03-04 DIAGNOSIS — M9901 Segmental and somatic dysfunction of cervical region: Secondary | ICD-10-CM | POA: Diagnosis not present

## 2017-03-13 DIAGNOSIS — M5010 Cervical disc disorder with radiculopathy, unspecified cervical region: Secondary | ICD-10-CM | POA: Diagnosis not present

## 2017-03-13 DIAGNOSIS — M9902 Segmental and somatic dysfunction of thoracic region: Secondary | ICD-10-CM | POA: Diagnosis not present

## 2017-03-13 DIAGNOSIS — M542 Cervicalgia: Secondary | ICD-10-CM | POA: Diagnosis not present

## 2017-03-13 DIAGNOSIS — R51 Headache: Secondary | ICD-10-CM | POA: Diagnosis not present

## 2017-03-13 DIAGNOSIS — M9901 Segmental and somatic dysfunction of cervical region: Secondary | ICD-10-CM | POA: Diagnosis not present

## 2017-03-13 DIAGNOSIS — M9900 Segmental and somatic dysfunction of head region: Secondary | ICD-10-CM | POA: Diagnosis not present

## 2017-03-13 DIAGNOSIS — M546 Pain in thoracic spine: Secondary | ICD-10-CM | POA: Diagnosis not present

## 2017-03-18 NOTE — Addendum Note (Signed)
Addendum  created 03/18/17 1015 by Oleta Mouse, MD   Sign clinical note

## 2017-03-22 IMAGING — CR DG CHEST 2V
2 series · 2 of 2 positions shown · non-contrast
Comparison: Radiographs December 08, 2016.

CLINICAL DATA: Shortness of breath, history of lung cancer.

EXAM:
CHEST  2 VIEW

[w chest pa]
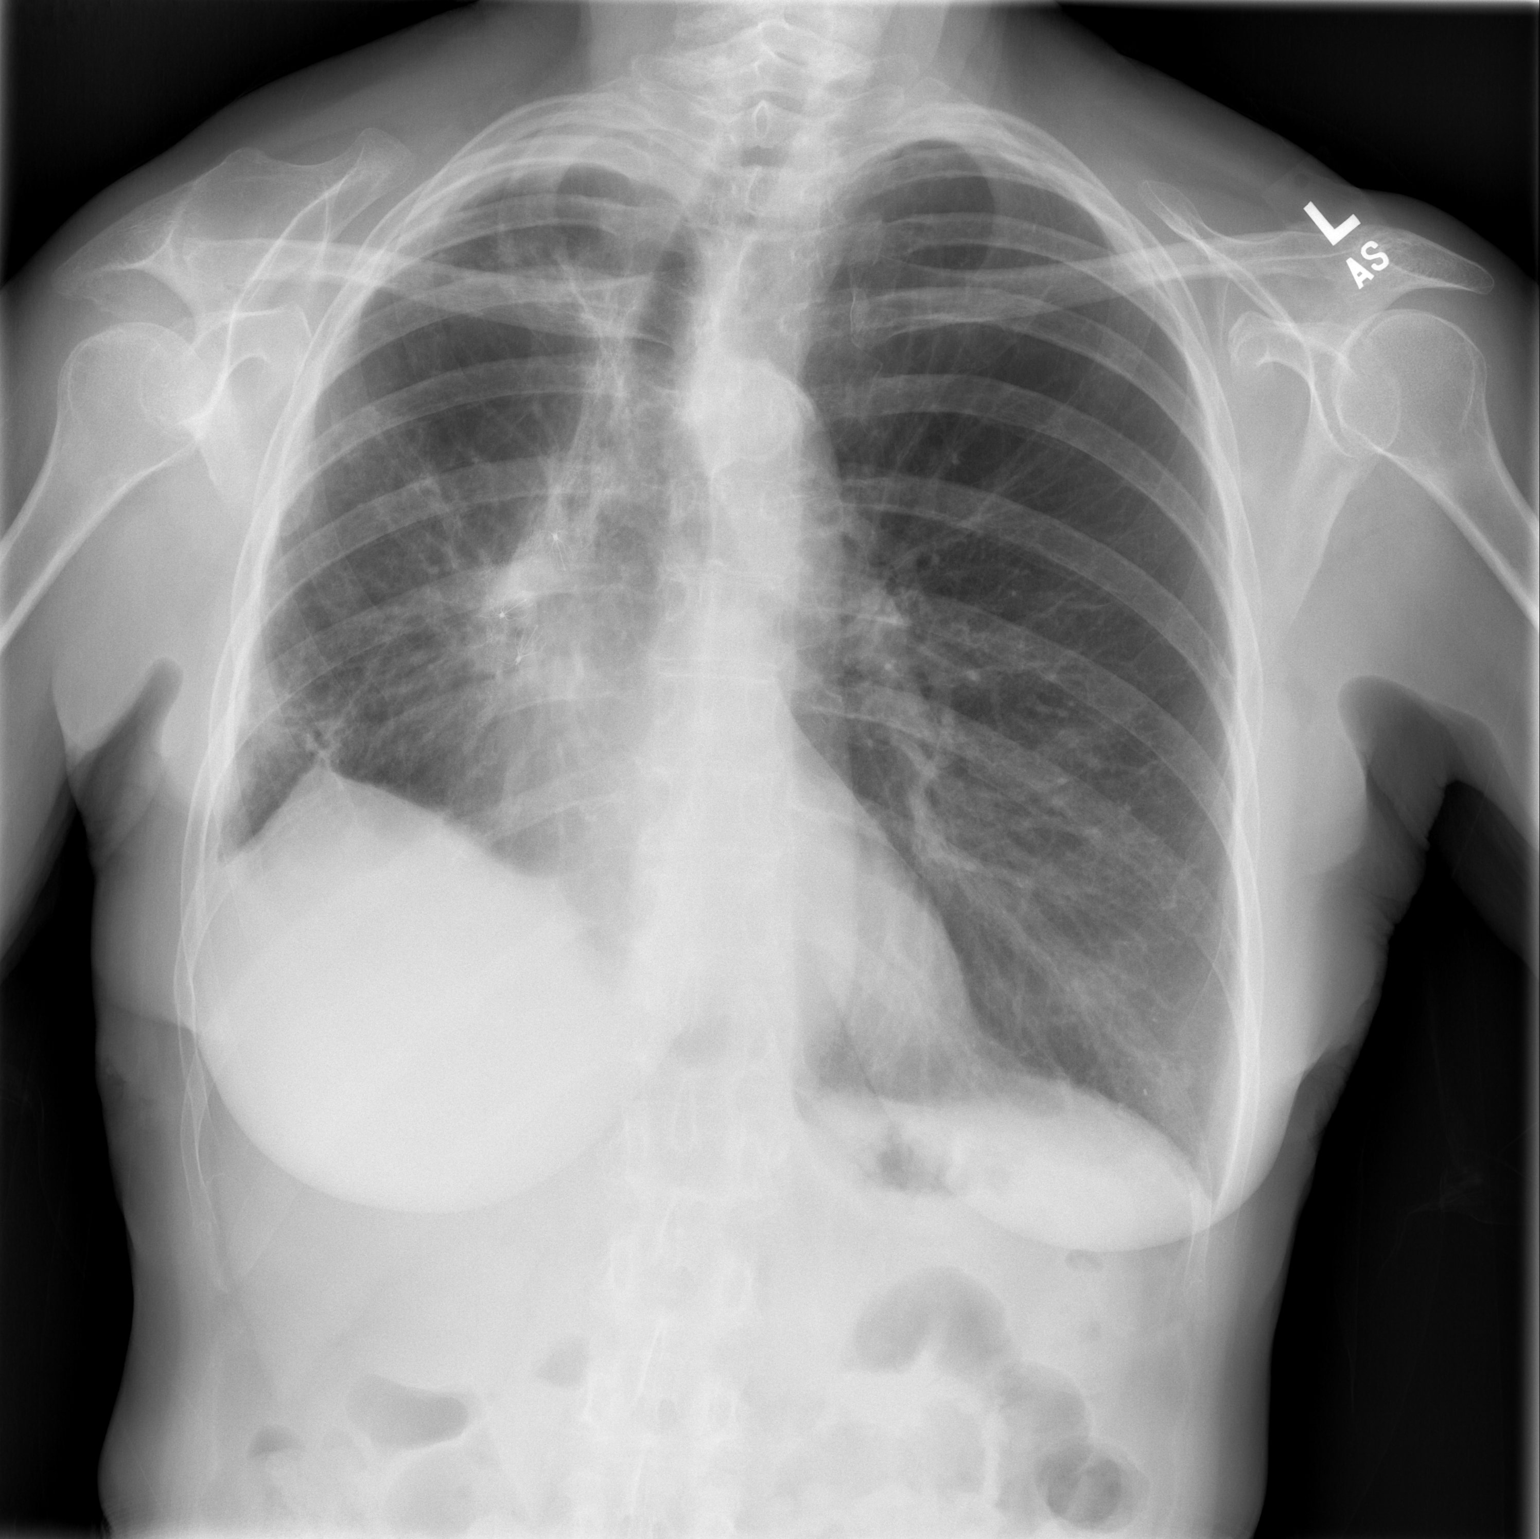

[w chest lat]
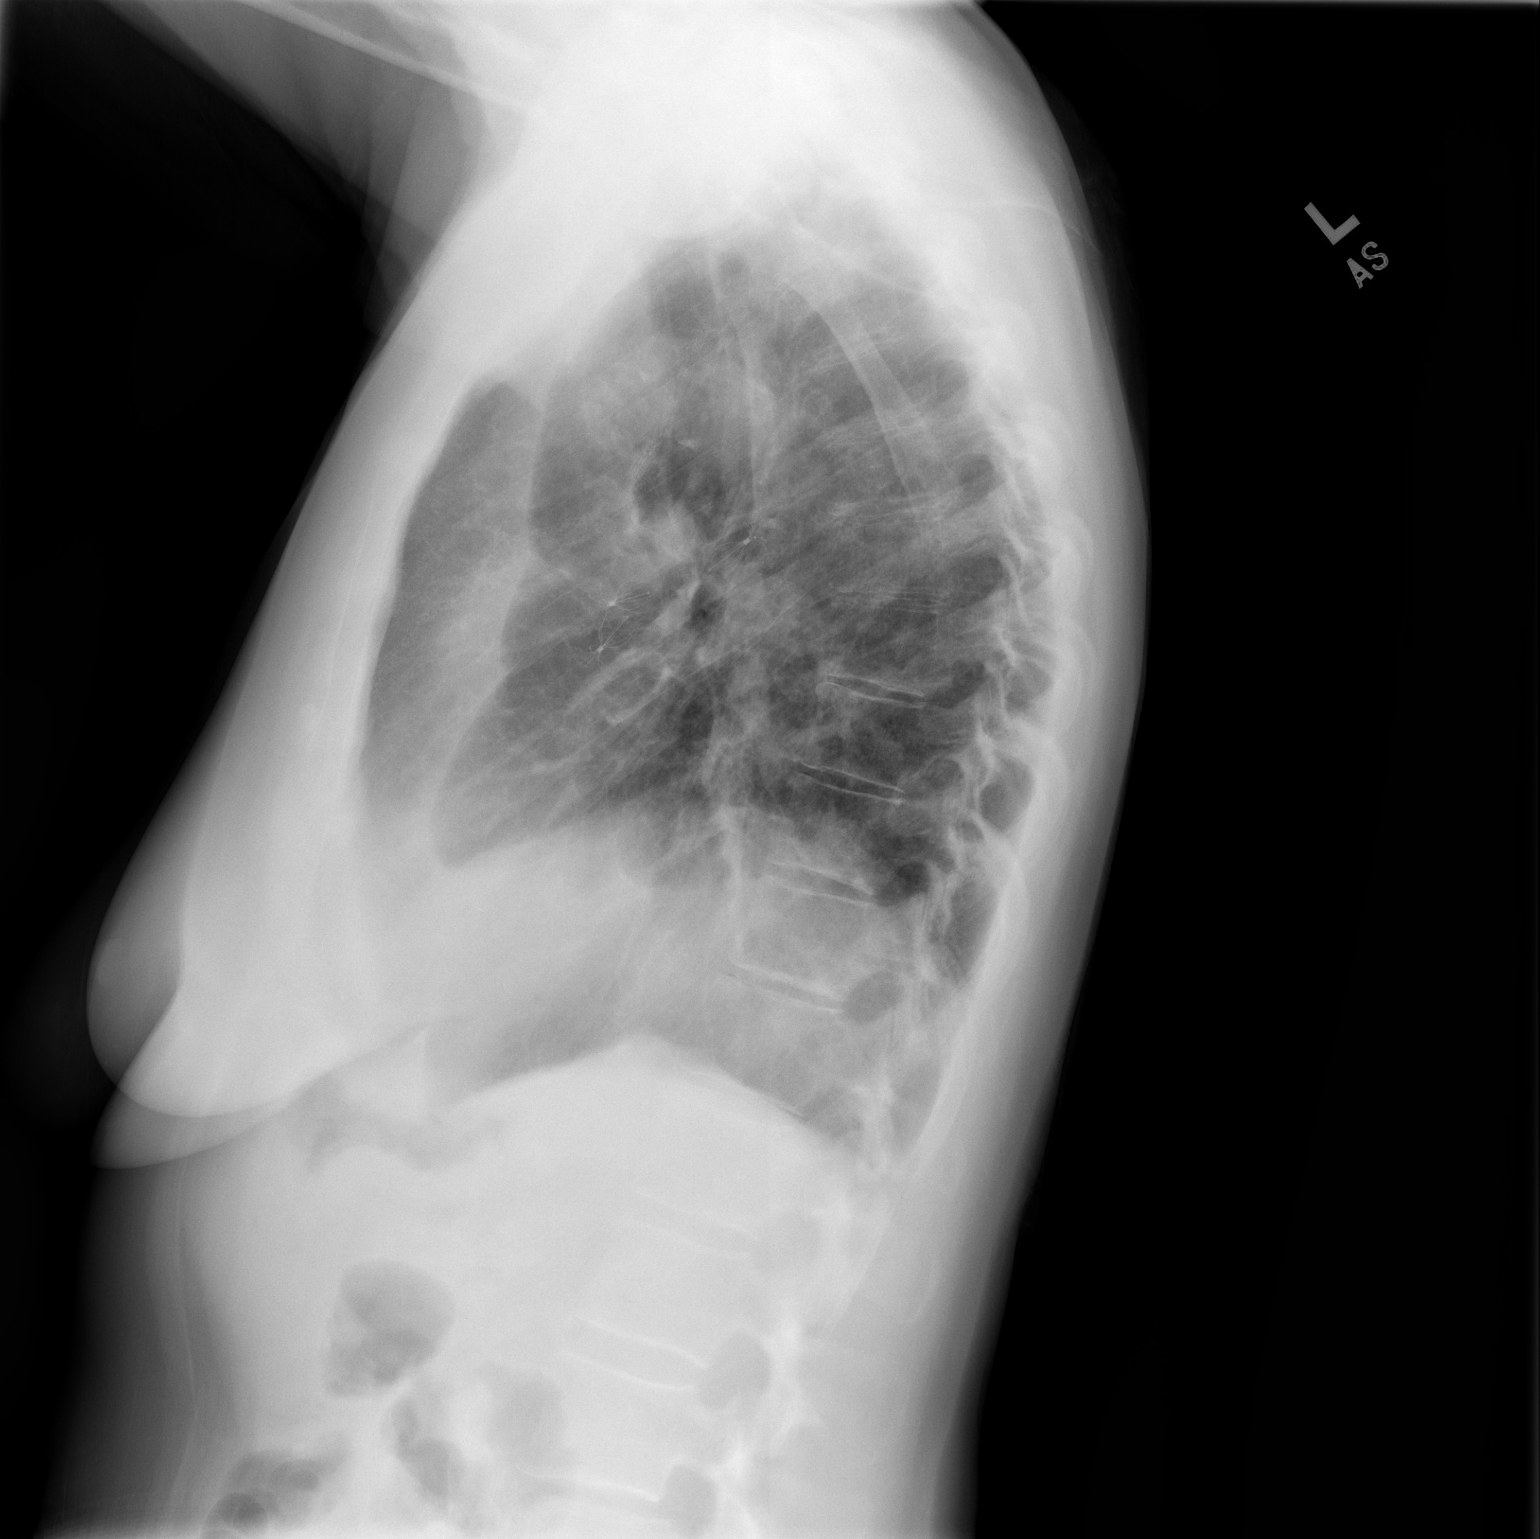

[2 of 2 positions shown; findings below may reference images not displayed]

FINDINGS: Stable cardiomediastinal silhouette. Atherosclerosis of thoracic
aorta is noted. No pneumothorax is noted. Stable right apical
pleural thickening or scarring is noted. Stable postoperative
changes and volume loss are seen involving the right lung and hilar
region. Left lung is hyperexpanded. No consolidative process is
noted. Bony thorax is unremarkable.
IMPRESSION: Stable postoperative changes seen involving the right hemithorax. No
acute cardiopulmonary abnormality seen.

## 2017-03-22 IMAGING — CT CT CHEST W/O CM
3 of 4 series · 17 of 30 positions shown, 19 images · non-contrast
Comparison: 08/17/2016

CLINICAL DATA: Status post right upper lobe removal.  Lung cancer.

EXAM:
CT CHEST WITHOUT CONTRAST
TECHNIQUE: Multidetector CT imaging of the chest was performed following the
standard protocol without IV contrast.

[Series 3: chest w/o · axial · non-contrast · 0.60mm/px · z∈[-248,-12]mm · 8 of 122 slices shown, 10 images]
[im 14/122  mediastinal]
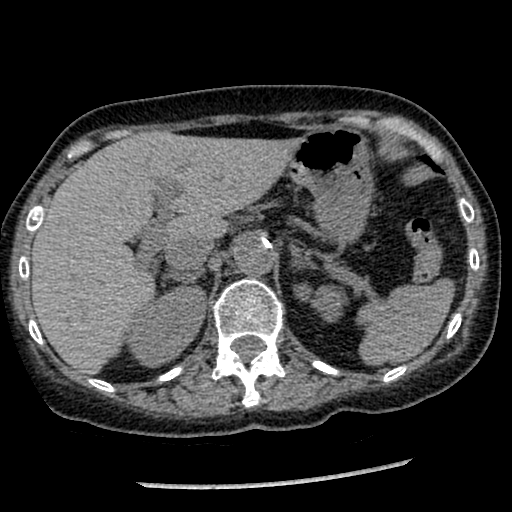
[im 14/122  lung]
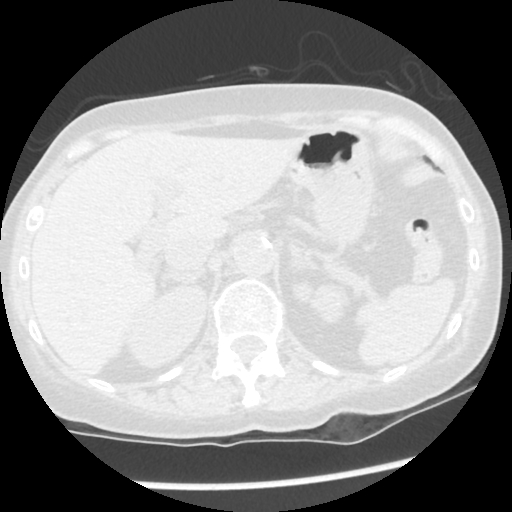
[im 27/122  lung]
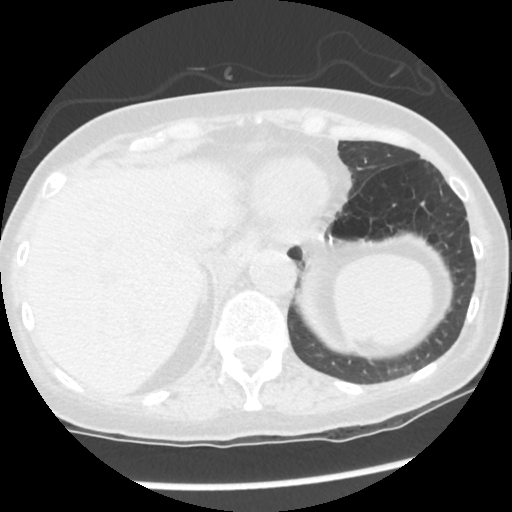
[im 41/122  lung]
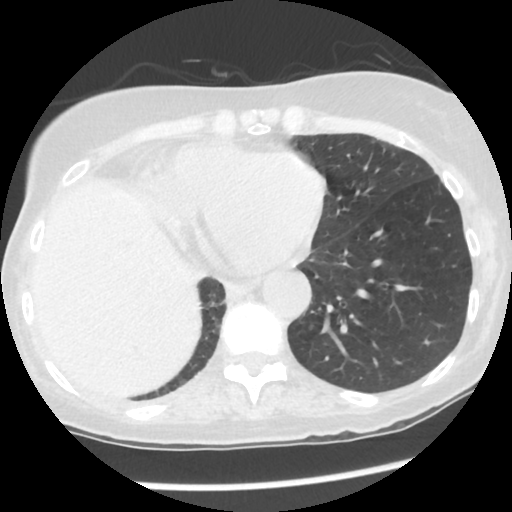
[im 54/122  lung]
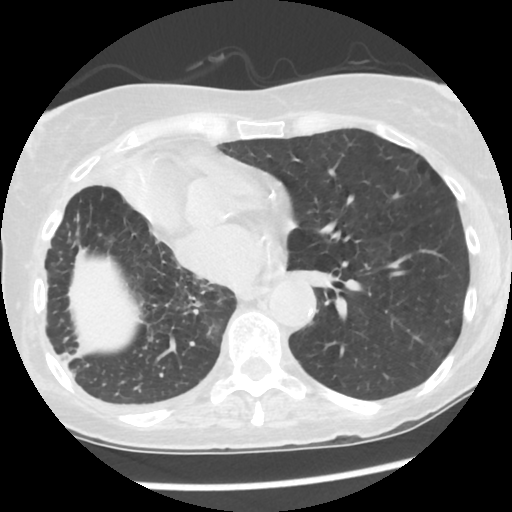
[im 68/122  mediastinal]
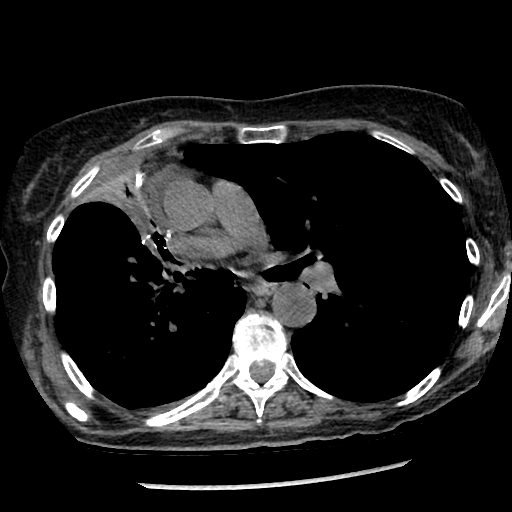
[im 68/122  lung]
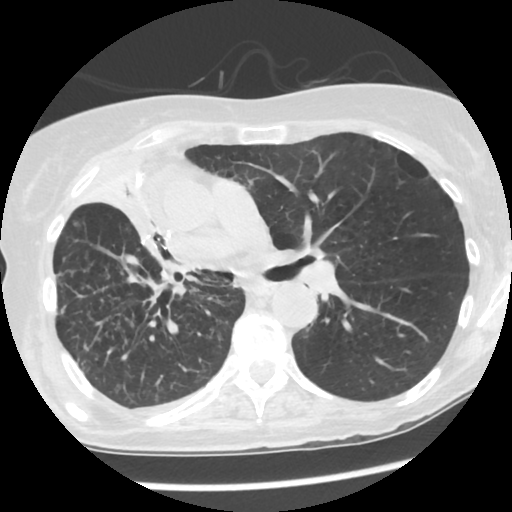
[im 81/122  lung]
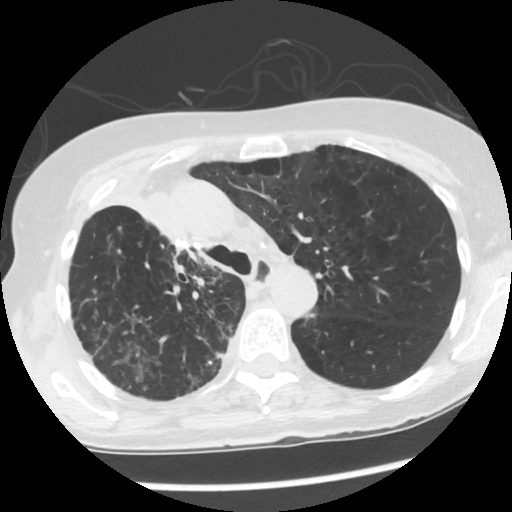
[im 95/122  lung]
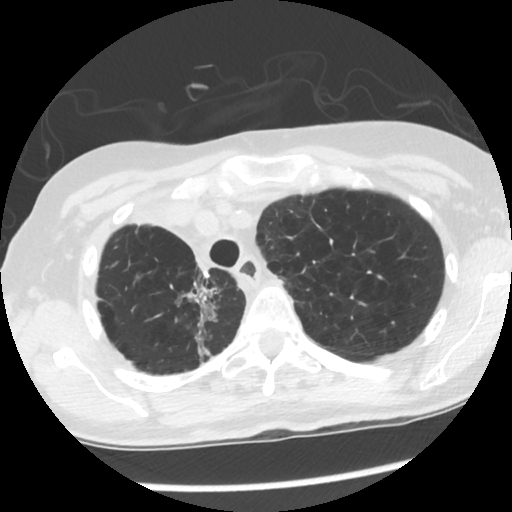
[im 108/122  lung]
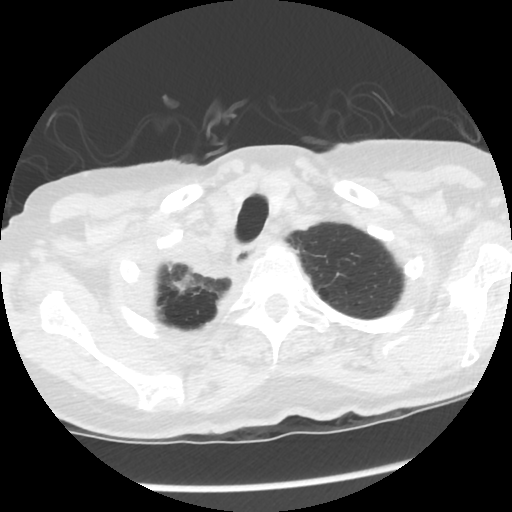

[Series 4: lung windows · axial · 0.60mm/px · z∈[-242,-18]mm · 7 of 122 slices shown]
[im 16/122  lung]
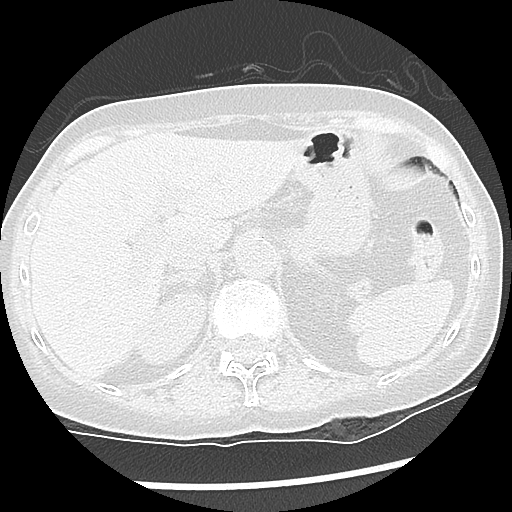
[im 31/122  lung]
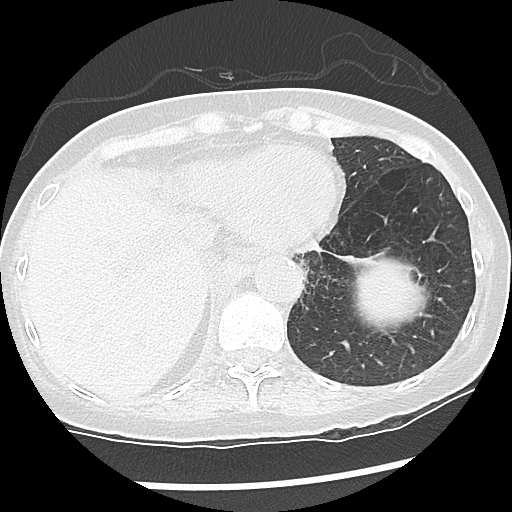
[im 46/122  lung]
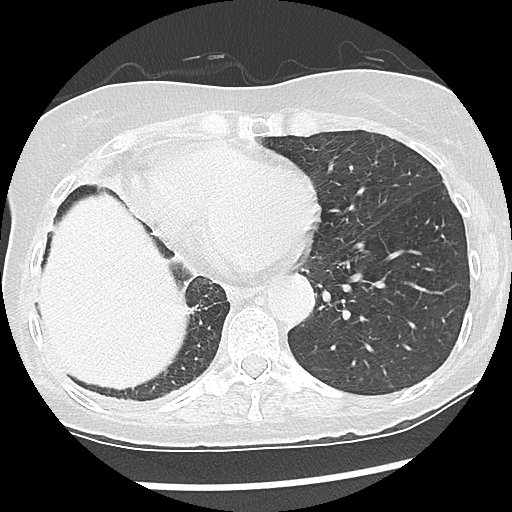
[im 61/122  lung]
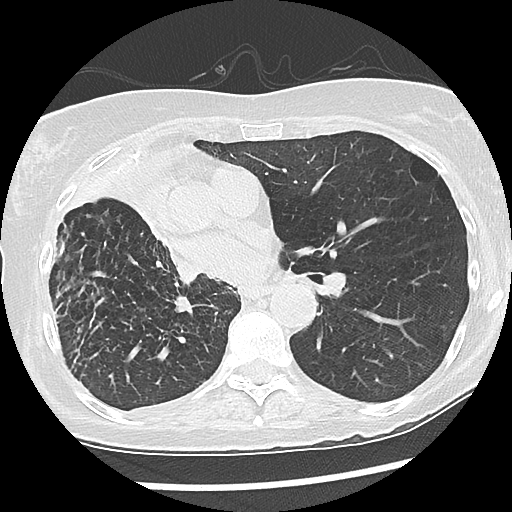
[im 76/122  lung]
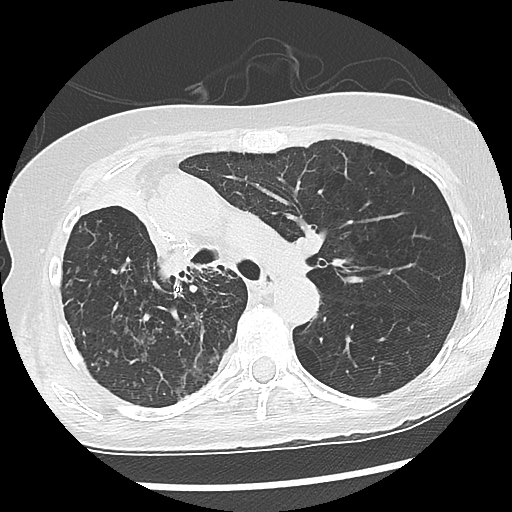
[im 91/122  lung]
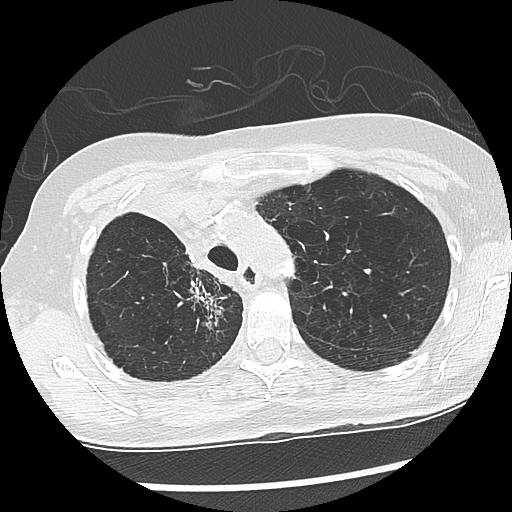
[im 106/122  lung]
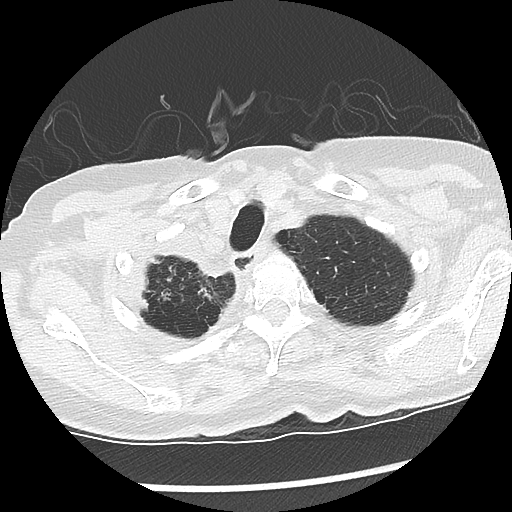

[Series 602: sagittal body · sagittal · 0.60mm/px · 2 of 124 slices shown]
[im 16/124  mediastinal]
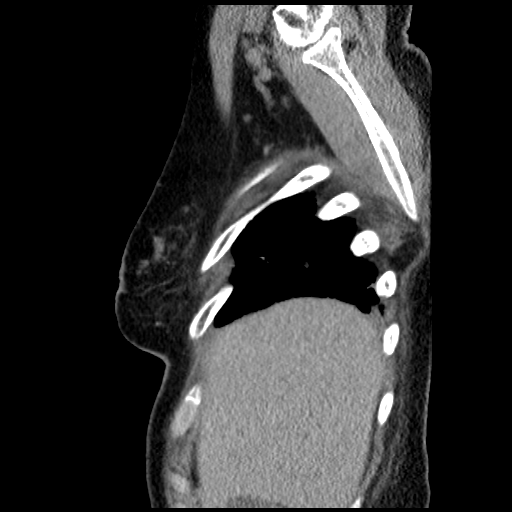
[im 31/124  mediastinal]
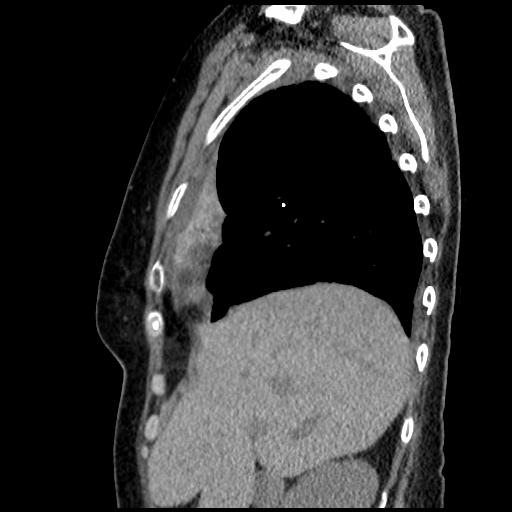

[17 of 30 positions shown; findings below may reference images not displayed]

FINDINGS: Cardiovascular: Normal heart size. There is aortic atherosclerosis
identified. Calcification within the RCA coronary artery noted. No
pericardial effusion.

Mediastinum/Nodes: No mediastinal or hilar adenopathy. The trachea
appears patent and is midline. Normal appearance of the esophagus.

Lungs/Pleura: Moderate-to-marked changes of centrilobular and
paraseptal emphysema. Postoperative changes from right upper lobe
resection. There is complete atelectasis of the right middle lobe is
noted. Bronchiectasis, architectural distortion and tree in bud
nodularity is noted throughout the right lower lobe.

Upper Abdomen: No acute abnormality. Multiple areas of scarring
identified involving the left kidney. Small stones are identified
within the left renal collecting system. Stones and/or sludge noted
within the gallbladder.

Musculoskeletal: No aggressive lytic or sclerotic bone lesions.
IMPRESSION: 1. Postsurgical changes from right upper lobe lobectomy.
2. Complete atelectasis of the right middle lobe which may reflect
postsurgical change.
3. Extensive postinflammatory changes within the right lower lobe
including bronchiectasis, architectural distortion and tree-in-bud
nodularity. Findings may reflect recurrent aspiration status post
surgery and/or chronic infection.

## 2017-03-26 DIAGNOSIS — R51 Headache: Secondary | ICD-10-CM | POA: Diagnosis not present

## 2017-03-26 DIAGNOSIS — M542 Cervicalgia: Secondary | ICD-10-CM | POA: Diagnosis not present

## 2017-03-26 DIAGNOSIS — M9901 Segmental and somatic dysfunction of cervical region: Secondary | ICD-10-CM | POA: Diagnosis not present

## 2017-03-26 DIAGNOSIS — M5010 Cervical disc disorder with radiculopathy, unspecified cervical region: Secondary | ICD-10-CM | POA: Diagnosis not present

## 2017-03-26 DIAGNOSIS — M9902 Segmental and somatic dysfunction of thoracic region: Secondary | ICD-10-CM | POA: Diagnosis not present

## 2017-03-26 DIAGNOSIS — M9900 Segmental and somatic dysfunction of head region: Secondary | ICD-10-CM | POA: Diagnosis not present

## 2017-03-26 DIAGNOSIS — M546 Pain in thoracic spine: Secondary | ICD-10-CM | POA: Diagnosis not present

## 2017-04-02 DIAGNOSIS — L821 Other seborrheic keratosis: Secondary | ICD-10-CM | POA: Diagnosis not present

## 2017-04-02 DIAGNOSIS — D229 Melanocytic nevi, unspecified: Secondary | ICD-10-CM | POA: Diagnosis not present

## 2017-04-04 DIAGNOSIS — R921 Mammographic calcification found on diagnostic imaging of breast: Secondary | ICD-10-CM | POA: Diagnosis not present

## 2017-04-08 ENCOUNTER — Ambulatory Visit (HOSPITAL_COMMUNITY)
Admission: RE | Admit: 2017-04-08 | Discharge: 2017-04-08 | Disposition: A | Discharge status: Home / Self Care | Payer: Medicare Other | Source: Ambulatory Visit | Attending: Internal Medicine | Admitting: Internal Medicine

## 2017-04-08 ENCOUNTER — Other Ambulatory Visit (HOSPITAL_BASED_OUTPATIENT_CLINIC_OR_DEPARTMENT_OTHER): Payer: Medicare Other

## 2017-04-08 ENCOUNTER — Encounter (HOSPITAL_COMMUNITY): Payer: Self-pay

## 2017-04-08 DIAGNOSIS — C3411 Malignant neoplasm of upper lobe, right bronchus or lung: Secondary | ICD-10-CM

## 2017-04-08 DIAGNOSIS — J439 Emphysema, unspecified: Secondary | ICD-10-CM | POA: Diagnosis not present

## 2017-04-08 DIAGNOSIS — Z902 Acquired absence of lung [part of]: Secondary | ICD-10-CM | POA: Diagnosis not present

## 2017-04-08 DIAGNOSIS — R918 Other nonspecific abnormal finding of lung field: Secondary | ICD-10-CM | POA: Diagnosis not present

## 2017-04-08 DIAGNOSIS — K319 Disease of stomach and duodenum, unspecified: Secondary | ICD-10-CM | POA: Insufficient documentation

## 2017-04-08 DIAGNOSIS — I700 Atherosclerosis of aorta: Secondary | ICD-10-CM | POA: Insufficient documentation

## 2017-04-08 DIAGNOSIS — I2510 Atherosclerotic heart disease of native coronary artery without angina pectoris: Secondary | ICD-10-CM | POA: Diagnosis not present

## 2017-04-08 LAB — CBC WITH DIFFERENTIAL/PLATELET
BASO%: 1.7 % (ref 0.0–2.0)
Basophils Absolute: 0.1 10*3/uL (ref 0.0–0.1)
EOS%: 4.2 % (ref 0.0–7.0)
Eosinophils Absolute: 0.3 10*3/uL (ref 0.0–0.5)
HCT: 34.7 % — ABNORMAL LOW (ref 34.8–46.6)
HGB: 11.4 g/dL — ABNORMAL LOW (ref 11.6–15.9)
LYMPH%: 29.8 % (ref 14.0–49.7)
MCH: 27.9 pg (ref 25.1–34.0)
MCHC: 32.9 g/dL (ref 31.5–36.0)
MCV: 84.8 fL (ref 79.5–101.0)
MONO#: 0.7 10*3/uL (ref 0.1–0.9)
MONO%: 10.6 % (ref 0.0–14.0)
NEUT#: 3.4 10*3/uL (ref 1.5–6.5)
NEUT%: 53.7 % (ref 38.4–76.8)
PLATELETS: 272 10*3/uL (ref 145–400)
RBC: 4.09 10*6/uL (ref 3.70–5.45)
RDW: 15.2 % — ABNORMAL HIGH (ref 11.2–14.5)
WBC: 6.3 10*3/uL (ref 3.9–10.3)
lymph#: 1.9 10*3/uL (ref 0.9–3.3)

## 2017-04-08 LAB — COMPREHENSIVE METABOLIC PANEL
ALT: 11 U/L (ref 0–55)
AST: 16 U/L (ref 5–34)
Albumin: 3.4 g/dL — ABNORMAL LOW (ref 3.5–5.0)
Alkaline Phosphatase: 70 U/L (ref 40–150)
Anion Gap: 11 mEq/L (ref 3–11)
BILIRUBIN TOTAL: 0.34 mg/dL (ref 0.20–1.20)
BUN: 28.3 mg/dL — ABNORMAL HIGH (ref 7.0–26.0)
CHLORIDE: 103 meq/L (ref 98–109)
CO2: 24 mEq/L (ref 22–29)
Calcium: 9.6 mg/dL (ref 8.4–10.4)
Creatinine: 1.4 mg/dL — ABNORMAL HIGH (ref 0.6–1.1)
EGFR: 36 mL/min/{1.73_m2} — AB (ref >90)
Glucose: 96 mg/dl (ref 70–140)
POTASSIUM: 4.2 meq/L (ref 3.5–5.1)
SODIUM: 137 meq/L (ref 136–145)
Total Protein: 7 g/dL (ref 6.4–8.3)

## 2017-04-08 MED ORDER — IOPAMIDOL (ISOVUE-300) INJECTION 61%
75.0000 mL | Freq: Once | INTRAVENOUS | Status: AC | PRN
  Administered 2017-04-08: 60 mL via INTRAVENOUS

## 2017-04-08 MED ORDER — IOPAMIDOL (ISOVUE-300) INJECTION 61%
INTRAVENOUS | Status: AC
  Filled 2017-04-08: qty 75

## 2017-04-12 DIAGNOSIS — I4891 Unspecified atrial fibrillation: Secondary | ICD-10-CM | POA: Diagnosis not present

## 2017-04-15 ENCOUNTER — Encounter: Payer: Self-pay | Admitting: Internal Medicine

## 2017-04-15 ENCOUNTER — Ambulatory Visit (HOSPITAL_BASED_OUTPATIENT_CLINIC_OR_DEPARTMENT_OTHER): Payer: Medicare Other | Admitting: Internal Medicine

## 2017-04-15 ENCOUNTER — Telehealth: Payer: Self-pay | Admitting: Internal Medicine

## 2017-04-15 VITALS — BP 134/76 | HR 72 | Temp 98.0°F | Resp 20 | Ht 63.5 in | Wt 135.0 lb

## 2017-04-15 DIAGNOSIS — D638 Anemia in other chronic diseases classified elsewhere: Secondary | ICD-10-CM

## 2017-04-15 DIAGNOSIS — C3411 Malignant neoplasm of upper lobe, right bronchus or lung: Secondary | ICD-10-CM | POA: Diagnosis not present

## 2017-04-15 NOTE — Telephone Encounter (Signed)
Scheduled appt per 7/2 los - Gave patient AVS and calender per los.

## 2017-04-15 NOTE — Progress Notes (Signed)
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  PRIOR THERAPY: Status post right upper lobectomy with lymph node dissection under the care of Dr. Servando Snare on 10/29/2016.  CURRENT THERAPY: Observation.  INTERVAL HISTORY: [name] 71 y.o. female returns to the clinic today for follow-up visit accompanied by her husband. The patient is feeling fine today with no specific complaints except for postnasal drainage and mild cough. She denied having any chest pain, shortness breath or hemoptysis. She denied having any recent weight loss or night sweats. She has no nausea, vomiting, diarrhea or constipation. The patient had repeat CT scan of the chest performed recently and she is here for evaluation and discussion of her scan results.  MEDICAL HISTORY: Past Medical History:  Diagnosis Date  . Anxiety    panic attacks when she becomes short of breath- uses nebulizer prior to bedtime  . Arthritis   . Asthma    asthmatic bronchitis  . COPD (chronic obstructive pulmonary disease) ([facility])   . DVT (deep venous thrombosis) ([facility])   . Dyspnea    with exetrtion  . Dysrhythmia    Afib - while in hospital wwith sepsis  . History of kidney stones   . Hyperlipidemia   . Hypertension   . Pneumonia    years  . PONV (postoperative nausea and vomiting)    none with her hip surgery.  Has decreased concentration and abliity for 2- 3 weeks after anesthesai.  [name] Restless leg syndrome   . 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  MEDICATIONS:  Current Outpatient Prescriptions  Medication Sig Dispense Refill  . acetaminophen (TYLENOL) 500 MG tablet Take 1,000 mg by mouth every 6 (six) hours as needed for mild pain.     [name] albuterol (PROVENTIL HFA;VENTOLIN HFA) 108 (90 Base) MCG/ACT inhaler Inhale 1 puff into the lungs every 6 (six) hours as needed for wheezing or shortness of breath.    [name] albuterol (PROVENTIL) (2.5 MG/3ML) 0.083% nebulizer solution Take 2.5 mg by nebulization every 6 (six) hours as needed for wheezing or shortness of breath.    [name] amiodarone (PACERONE) 200 MG tablet Take 1 tablet (200 mg total) by mouth daily. 60 tablet 0  . ELIQUIS 5 MG TABS tablet     . fluticasone (FLONASE) 50 MCG/ACT nasal spray Place 2 sprays into both nostrils daily.     . furosemide (LASIX) 20 MG tablet Take 20 mg by mouth daily as needed for fluid or edema.     [name] ipratropium (ATROVENT) 0.02 % nebulizer solution Take 2.5 mLs (0.5 mg total) by nebulization 3 (three) times daily. 75 mL 12  . levalbuterol (XOPENEX) 0.63 MG/3ML nebulizer solution Take 3 mLs (0.63 mg total) by nebulization every 6 (six) hours as needed for wheezing or shortness of breath. 3 mL 12  . loratadine (CLARITIN) 10 MG tablet Take 10 mg by mouth daily.    . pantoprazole (PROTONIX) 40 MG tablet Take 1 tablet (40 mg total) by mouth daily. 30 tablet 0  . sodium chloride (OCEAN) 0.65 % SOLN nasal spray Place 1 spray into both nostrils as needed for congestion.  0  . Tiotropium Bromide-Olodaterol (STIOLTO RESPIMAT) 2.5-2.5 MCG/ACT AERS Inhale 2 puffs into the lungs  daily.    . zolpidem (AMBIEN) 10 MG tablet Take 5 mg by mouth at bedtime as needed for sleep.     No current facility-administered medications for this visit.     SURGICAL HISTORY:  Past Surgical History:  Procedure Laterality Date  . ABDOMINAL HYSTERECTOMY    . BONE DENSIDEXAY  06/02/2013  . CARDIAC CATHETERIZATION    . COLONOSCOPY  08/05/2015  . CYST EXCISION     from back  . CYSTO    . DENTAL EXAMINATION UNDER ANESTHESIA    . DIAGNOSTIC MAMMOGRAM  07/28/2016  . EYE SURGERY Left    cataract surgery with lens implant  . LYMPH NODE DISSECTION Right 10/29/2016   Procedure: LYMPH NODE  DISSECTION;  Surgeon: [name], MD;  Location: [facility];  Service: Thoracic;  Laterality: Right;  . TONSILLECTOMY    . TOTAL HIP ARTHROPLASTY  10/31/2012   Procedure: TOTAL Right HIP ARTHROPLASTY ANTERIOR APPROACH;  Surgeon: Mcarthur Rossetti, MD;  Location: [facility];  Service: Orthopedics;  Laterality: Right;  Right Total Hip Replacement  . VIDEO ASSISTED THORACOSCOPY (VATS)/WEDGE RESECTION Right 10/29/2016   Procedure: RIGHT  VIDEO ASSISTED THORACOSCOPY /RIGHT UPPER LOBE LUNG RESECTION and Right Thoracotomy;  Surgeon: [name], MD;  Location: [facility];  Service: Thoracic;  Laterality: Right;  [name] N/A 10/29/2016   Procedure: VIDEO BRONCHOSCOPY;  Surgeon: [name], MD;  Location: [facility];  Service: Thoracic;  Laterality: N/A;  . VIDEO BRONCHOSCOPY WITH ENDOBRONCHIAL NAVIGATION N/A 08/29/2016   Procedure: VIDEO BRONCHOSCOPY WITH ENDOBRONCHIAL NAVIGATION;  Surgeon: Collene Gobble, MD;  Location: [facility];  Service: Thoracic;  Laterality: N/A;  . VIDEO BRONCHOSCOPY WITH INSERTION OF INTERBRONCHIAL VALVE (IBV) N/A 11/05/2016   Procedure: VIDEO BRONCHOSCOPY WITH INSERTION OF INTERBRONCHIAL VALVE (IBV) times three;  Surgeon: [name], MD;  Location: [facility];  Service: Thoracic;  Laterality: N/A;  . VIDEO BRONCHOSCOPY WITH INSERTION OF INTERBRONCHIAL VALVE (IBV) N/A 01/09/2017   Procedure: VIDEO BRONCHOSCOPY WITH Removal OF INTERBRONCHIAL VALVE (IBV) x 3;  Surgeon: [name], MD;  Location: [facility];  Service: Thoracic;  Laterality: N/A;    REVIEW OF SYSTEMS:  A comprehensive review of systems was negative except for: Respiratory: positive for cough   PHYSICAL EXAMINATION: General appearance: alert, cooperative and no distress Head: Normocephalic, without obvious abnormality, atraumatic Neck: no adenopathy, no JVD, supple, symmetrical, trachea midline and thyroid not enlarged, symmetric, no tenderness/mass/nodules Lymph nodes: Cervical, supraclavicular, and  axillary nodes normal. Resp: clear to auscultation bilaterally Back: symmetric, no curvature. ROM normal. No CVA tenderness. Cardio: regular rate and rhythm, S1, S2 normal, no murmur, click, rub or gallop GI: soft, non-tender; bowel sounds normal; no masses,  no organomegaly Extremities: extremities normal, atraumatic, no cyanosis or edema  ECOG PERFORMANCE STATUS: 1 - Symptomatic but completely ambulatory  Blood pressure 134/76, pulse 72, temperature 98 F (36.7 C), temperature source Oral, resp. rate 20, height 5' 3.5" (1.613 m), weight 135 lb (61.2 kg), SpO2 98 %.  LABORATORY DATA: Lab Results  Component Value Date   WBC 6.3 04/08/2017   HGB 11.4 (L) 04/08/2017   HCT 34.7 (L) 04/08/2017   MCV 84.8 04/08/2017   PLT 272 04/08/2017      Chemistry      Component Value Date/Time   NA 137 04/08/2017 0752   K 4.2 04/08/2017 0752   CL 98 (L) 12/06/2016 0404   CO2 24 04/08/2017 0752   BUN 28.3 (H) 04/08/2017 0752   CREATININE  1.4 (H) 04/08/2017 0752      Component Value Date/Time   CALCIUM 9.6 04/08/2017 0752   ALKPHOS 70 04/08/2017 0752   AST 16 04/08/2017 0752   ALT 11 04/08/2017 0752   BILITOT 0.34 04/08/2017 0752       RADIOGRAPHIC STUDIES: Ct Chest W Contrast  Result Date: 04/08/2017 CLINICAL DATA:  Status post right upper lobectomy 10/29/2016 for squamous cell lung carcinoma. Restaging. EXAM: CT CHEST WITH CONTRAST TECHNIQUE: Multidetector CT imaging of the chest was performed during intravenous contrast administration. CONTRAST:  20mL ISOVUE-300 IOPAMIDOL (ISOVUE-300) INJECTION 61% COMPARISON:  01/03/2017 chest CT. 01/09/2017 chest radiograph. FINDINGS: Cardiovascular: Normal heart size. No significant pericardial fluid/thickening. Left anterior descending, left circumflex and right coronary atherosclerosis. Atherosclerotic nonaneurysmal thoracic aorta. Normal caliber pulmonary arteries. No central pulmonary emboli. Mediastinum/Nodes: Stable bilateral hypodense thyroid  nodules, largest 1.1 cm in the left thyroid lobe. Unremarkable esophagus. No pathologically enlarged axillary, mediastinal or hilar lymph nodes. Lungs/Pleura: Status post right upper lobectomy. No pneumothorax. No pleural effusion. Moderate centrilobular and paraseptal emphysema with diffuse bronchial wall thickening. Improved aeration in the right middle lobe. Mild patchy residual tree-in-bud type ground-glass opacities in the superior segment right lower lobe, decreased. No acute consolidative airspace disease, lung masses or new significant pulmonary nodules. Upper abdomen: There is persistent masslike wall thickening in the posterior gastric cardia measuring 3.9 x 2.4 cm (series 2/ image 130), which was hypermetabolic on 60/07/9322 PET-CT study. Tiny hiatal hernia. No discrete adrenal nodules. Stable scattered tiny nonobstructing stones and renal cortical scarring in the visualized upper left kidney. Musculoskeletal: No aggressive appearing focal osseous lesions. Moderate thoracic spondylosis. Healing post thoracotomy changes on the right anterolateral third and fourth ribs. IMPRESSION: 1. Persistent masslike wall thickening in the posterior gastric cardia, which was hypermetabolic as described on 55/73/2202 PET-CT report. Gastric neoplasm cannot be excluded and upper endoscopic correlation is advised if not performed in the interval since the PET-CT study. 2. No evidence of local tumor recurrence in the right lung status post right upper lobectomy. 3. No evidence of metastatic disease in the chest. 4. Mild patchy tree-in-bud type opacities in the superior segment right lower lobe are decreased, favor improved nonspecific mild bronchiolitis. Improved right middle lobe aeration. 5. Three-vessel coronary atherosclerosis. Aortic Atherosclerosis (ICD10-I70.0) and Emphysema (ICD10-J43.9). Electronically Signed   By: Ilona Sorrel M.D.   On: 04/08/2017 10:33    ASSESSMENT AND PLAN:  This is a very pleasant 71 years  old white female with a stage II a non-small cell lung cancer, adenosquamous carcinoma diagnosed in November 2017 status post right upper lobectomy with lymph node dissection complicated with early can sepsis. She is currently on observation and the patient is feeling much better. Her recent CT scan of the chest showed no evidence for disease recurrence but the scan showed persistent masslike pleural thickening in the posterior gastric cardia that was previously evaluated by Dr. Watt Climes before her thoracic surgery and there was no evidence of malignancy and will adjust inflammatory process. I recommended for the patient to continue on observation with repeat CT scan of the chest in 6 months. She was advised to call immediately if she has any concerning symptoms in the interval. The patient voices understanding of current disease status and treatment options and is in agreement with the current care plan. All questions were answered. The patient knows to call the clinic with any problems, questions or concerns. We can certainly see the patient much sooner if necessary. I spent 10 minutes counseling  the patient face to face. The total time spent in the appointment was 15 minutes.  Disclaimer: This note was dictated with voice recognition software. Similar sounding words can inadvertently be transcribed and may not be corrected upon review.

## 2017-04-18 ENCOUNTER — Ambulatory Visit (INDEPENDENT_AMBULATORY_CARE_PROVIDER_SITE_OTHER): Payer: Medicare Other | Admitting: Cardiothoracic Surgery

## 2017-04-18 ENCOUNTER — Encounter: Payer: Self-pay | Admitting: Cardiothoracic Surgery

## 2017-04-18 VITALS — BP 126/72 | HR 72 | Resp 20 | Ht 63.5 in | Wt 136.0 lb

## 2017-04-18 DIAGNOSIS — Z902 Acquired absence of lung [part of]: Secondary | ICD-10-CM | POA: Diagnosis not present

## 2017-04-18 DIAGNOSIS — C3411 Malignant neoplasm of upper lobe, right bronchus or lung: Secondary | ICD-10-CM

## 2017-04-18 NOTE — Progress Notes (Signed)
[address]       [place], [address]             [phone]      Jael H [facility] Medical Record #[id number] Date of Birth: [birth year]  Referring: Curt Bears, MD Primary Care: [name], MD  Chief Complaint:   POST OP FOLLOW UP 10/29/2016 OPERATIVE REPORT PREOPERATIVE DIAGNOSIS:  Squamous cell carcinoma, right upper lobe POSTOPERATIVE DIAGNOSIS:  Squamous cell carcinoma, right upper lobe. SURGICAL PROCEDURE:  Video bronchoscopy, right video-assisted thoracoscopy, mini thoracotomy, right upper lobectomy with lymph node dissection, and placement of On-Q. SURGEON:  Lanelle Bal, M.D.  11/05/2016  OPERATIVE REPORT PREOPERATIVE DIAGNOSIS:  Persistent air leak after right upper lobectomy for non-small cell carcinoma of the lung. PROCEDURE PERFORMED:  Video bronchoscopy under general anesthesia with placement of a Spiration intrabronchial valves x3 in medial and lateral segments of the middle lobe and superior segment of right lower lobe. SURGEON:  Lanelle Bal, MD.  Cancer Staging Malignant neoplasm of upper lobe of right lung [facility]) Staging form: Lung, AJCC 8th Edition - Pathologic stage from 10/23/2016: Stage IIA (pT2b, pN0, cM0) - Signed by [name], MD on 10/30/2016   CURRENT THERAPY: Observation.   History of Present Illness:    Patient returns for postop visit after right upper lobectomy done for stage II a carcinoma the lung. A follow-up CT scan has recently been done. The patient's postop course was complicated by respiratory failure and persistent air leak and sepsis. Bronchial valves were placed. Ultimately her air leak resolved. She improved in inpatient rehabilitation and ultimately was discharged home.   She is now returned to preop functions, says her breathing is "not half bad". She does have some shortness of breath with exertion but does not seem to be limited by this. She is planning a cruise to  [place] next month.   She continues on Coumadin, amiodarone twice a day.     Past Medical History:  Diagnosis Date  . Anxiety    panic attacks when she becomes short of breath- uses nebulizer prior to bedtime  . Arthritis   . Asthma    asthmatic bronchitis  . COPD (chronic obstructive pulmonary disease) ([facility])   . DVT (deep venous thrombosis) ([facility])   . Dyspnea    with exetrtion  . Dysrhythmia    Afib - while in hospital wwith sepsis  . History of kidney stones   . Hyperlipidemia   . Hypertension   . Pneumonia    years  . PONV (postoperative nausea and vomiting)    none with her hip surgery.  Has decreased concentration and abliity for 2- 3 weeks after anesthesai.  [name] Restless leg syndrome   . Stage I squamous cell carcinoma of right lung ([facility]) 09/11/2016     History  Smoking Status  . Former Smoker  . Packs/day: 0.00  . Years: 45.00  . Quit date: 08/29/2016  Smokeless Tobacco  . Never Used    History  Alcohol Use No     Allergies  Allergen Reactions  . Nickel Rash    Current Outpatient Prescriptions  Medication Sig Dispense Refill  . acetaminophen (TYLENOL) 500 MG tablet Take 1,000 mg by mouth every 6 (six) hours as needed for mild pain.    [name] albuterol (PROVENTIL HFA;VENTOLIN HFA) 108 (90 Base) MCG/ACT inhaler Inhale 1 puff into the lungs every 6 (six) hours as needed for wheezing or shortness of breath.    [name]  albuterol (PROVENTIL) (2.5 MG/3ML) 0.083% nebulizer solution Take 2.5 mg by nebulization every 6 (six) hours as needed for wheezing or shortness of breath.    Arne Cleveland 5 MG TABS tablet     . fluticasone (FLONASE) 50 MCG/ACT nasal spray Place 2 sprays into both nostrils daily.     . furosemide (LASIX) 20 MG tablet Take 20 mg by mouth daily as needed for fluid or edema.     [name] loratadine (CLARITIN) 10 MG tablet Take 10 mg by mouth daily.    . Multiple Vitamin (MULTIVITAMIN) tablet Take 1 tablet by mouth daily.    . pantoprazole (PROTONIX) 40 MG tablet Take  1 tablet (40 mg total) by mouth daily. 30 tablet 0  . sodium chloride (OCEAN) 0.65 % SOLN nasal spray Place 1 spray into both nostrils as needed for congestion.  0  . Tiotropium Bromide-Olodaterol (STIOLTO RESPIMAT) 2.5-2.5 MCG/ACT AERS Inhale 2 puffs into the lungs daily.    [name] zolpidem (AMBIEN) 10 MG tablet Take 5 mg by mouth at bedtime as needed for sleep.     No current facility-administered medications for this visit.        Physical Exam: BP 126/72   Pulse 72   Resp 20   Ht 5' 3.5" (1.613 m)   Wt 136 lb (61.7 kg)   SpO2 93% Comment: RA  BMI 23.71 kg/m   General appearance: alert, cooperative, appears older than stated age and no distress Neurologic: intact Heart: regular rate and rhythm, S1, S2 normal, no murmur, click, rub or gallop Lungs: diminished breath sounds bibasilar Abdomen: soft, non-tender; bowel sounds normal; no masses,  no organomegaly Extremities: extremities normal, atraumatic, no cyanosis or edema Wound: Incisions and chest tube sites  are well-healed,    Diagnostic Studies & Laboratory data:     Recent Radiology Findings:   Ct Chest W Contrast  Result Date: 04/08/2017 CLINICAL DATA:  Status post right upper lobectomy 10/29/2016 for squamous cell lung carcinoma. Restaging. EXAM: CT CHEST WITH CONTRAST TECHNIQUE: Multidetector CT imaging of the chest was performed during intravenous contrast administration. CONTRAST:  21mL ISOVUE-300 IOPAMIDOL (ISOVUE-300) INJECTION 61% COMPARISON:  01/03/2017 chest CT. 01/09/2017 chest radiograph. FINDINGS: Cardiovascular: Normal heart size. No significant pericardial fluid/thickening. Left anterior descending, left circumflex and right coronary atherosclerosis. Atherosclerotic nonaneurysmal thoracic aorta. Normal caliber pulmonary arteries. No central pulmonary emboli. Mediastinum/Nodes: Stable bilateral hypodense thyroid nodules, largest 1.1 cm in the left thyroid lobe. Unremarkable esophagus. No pathologically enlarged  axillary, mediastinal or hilar lymph nodes. Lungs/Pleura: Status post right upper lobectomy. No pneumothorax. No pleural effusion. Moderate centrilobular and paraseptal emphysema with diffuse bronchial wall thickening. Improved aeration in the right middle lobe. Mild patchy residual tree-in-bud type ground-glass opacities in the superior segment right lower lobe, decreased. No acute consolidative airspace disease, lung masses or new significant pulmonary nodules. Upper abdomen: There is persistent masslike wall thickening in the posterior gastric cardia measuring 3.9 x 2.4 cm (series 2/ image 130), which was hypermetabolic on 36/14/4315 PET-CT study. Tiny hiatal hernia. No discrete adrenal nodules. Stable scattered tiny nonobstructing stones and renal cortical scarring in the visualized upper left kidney. Musculoskeletal: No aggressive appearing focal osseous lesions. Moderate thoracic spondylosis. Healing post thoracotomy changes on the right anterolateral third and fourth ribs. IMPRESSION: 1. Persistent masslike wall thickening in the posterior gastric cardia, which was hypermetabolic as described on 40/05/6760 PET-CT report. Gastric neoplasm cannot be excluded and upper endoscopic correlation is advised if not performed in the interval since the PET-CT study. 2.  No evidence of local tumor recurrence in the right lung status post right upper lobectomy. 3. No evidence of metastatic disease in the chest. 4. Mild patchy tree-in-bud type opacities in the superior segment right lower lobe are decreased, favor improved nonspecific mild bronchiolitis. Improved right middle lobe aeration. 5. Three-vessel coronary atherosclerosis. Aortic Atherosclerosis (ICD10-I70.0) and Emphysema (ICD10-J43.9). Electronically Signed   By: Ilona Sorrel M.D.   On: 04/08/2017 10:33     Recent Lab Findings: Lab Results  Component Value Date   WBC 6.3 04/08/2017   HGB 11.4 (L) 04/08/2017   HCT 34.7 (L) 04/08/2017   PLT 272 04/08/2017     GLUCOSE 96 04/08/2017   TRIG 137 11/12/2016   ALT 11 04/08/2017   AST 16 04/08/2017   NA 137 04/08/2017   K 4.2 04/08/2017   CL 98 (L) 12/06/2016   CREATININE 1.4 (H) 04/08/2017   BUN 28.3 (H) 04/08/2017   CO2 24 04/08/2017   INR 1.29 01/08/2017   Chronic Kidney Disease   Stage I     GFR >90  Stage II    GFR 60-89  Stage IIIA GFR 45-59  Stage IIIB GFR 30-44  Stage IV   GFR 15-29  Stage V    GFR  <15  Lab Results  Component Value Date   CREATININE 1.4 (H) 04/08/2017   Estimated Creatinine Clearance: 31.2 mL/min (A) (by C-G formula based on SCr of 1.4 mg/dL (H)).    Assessment / Plan:    Patient stable after right upper lobectomy, follow-up CT scan says no evidence of recurrence. The gastric mass that was present on her previous PET scan, the resulted in endoscopy remains evident on the follow-up CT scan. Patient will contact Dr. Watt Climes office for further advice about follow-up.  I plan to see the patient back in 3 months with a follow-up chest x-ray   [name] MD      [address] [place] [address] Office [phone]   [place] [phone]  04/18/2017 2:59 PM

## 2017-04-22 DIAGNOSIS — R933 Abnormal findings on diagnostic imaging of other parts of digestive tract: Secondary | ICD-10-CM | POA: Diagnosis not present

## 2017-04-22 DIAGNOSIS — R1013 Epigastric pain: Secondary | ICD-10-CM | POA: Diagnosis not present

## 2017-05-01 DIAGNOSIS — R933 Abnormal findings on diagnostic imaging of other parts of digestive tract: Secondary | ICD-10-CM | POA: Diagnosis not present

## 2017-05-08 DIAGNOSIS — I4891 Unspecified atrial fibrillation: Secondary | ICD-10-CM | POA: Diagnosis not present

## 2017-07-17 ENCOUNTER — Other Ambulatory Visit: Payer: Self-pay | Admitting: *Deleted

## 2017-07-17 DIAGNOSIS — Z902 Acquired absence of lung [part of]: Secondary | ICD-10-CM

## 2017-07-18 ENCOUNTER — Encounter: Payer: Self-pay | Admitting: Cardiothoracic Surgery

## 2017-07-18 ENCOUNTER — Ambulatory Visit
Admission: RE | Admit: 2017-07-18 | Discharge: 2017-07-18 | Disposition: A | Discharge status: Home / Self Care | Payer: Medicare Other | Source: Ambulatory Visit | Attending: Cardiothoracic Surgery | Admitting: Cardiothoracic Surgery

## 2017-07-18 ENCOUNTER — Ambulatory Visit (INDEPENDENT_AMBULATORY_CARE_PROVIDER_SITE_OTHER): Payer: Medicare Other | Admitting: Cardiothoracic Surgery

## 2017-07-18 VITALS — BP 139/81 | HR 70 | Ht 63.5 in | Wt 142.0 lb

## 2017-07-18 DIAGNOSIS — I700 Atherosclerosis of aorta: Secondary | ICD-10-CM | POA: Diagnosis not present

## 2017-07-18 DIAGNOSIS — C3411 Malignant neoplasm of upper lobe, right bronchus or lung: Secondary | ICD-10-CM | POA: Diagnosis not present

## 2017-07-18 DIAGNOSIS — Z902 Acquired absence of lung [part of]: Secondary | ICD-10-CM

## 2017-07-18 NOTE — Progress Notes (Signed)
[address]       [place] [address]             [phone]      [facility] Medical Record #[id number] Date of Birth: 1946/08/18  Referring: [name], MD Primary Care: [name], MD  Chief Complaint:   POST OP FOLLOW UP 10/29/2016 OPERATIVE REPORT PREOPERATIVE DIAGNOSIS:  Squamous cell carcinoma, right upper lobe POSTOPERATIVE DIAGNOSIS:  Squamous cell carcinoma, right upper lobe. SURGICAL PROCEDURE:  Video bronchoscopy, right video-assisted thoracoscopy, mini thoracotomy, right upper lobectomy with lymph node dissection, and placement of On-Q. SURGEON:  Lanelle Bal, M.D.  11/05/2016  OPERATIVE REPORT PREOPERATIVE DIAGNOSIS:  Persistent air leak after right upper lobectomy for non-small cell carcinoma of the lung. PROCEDURE PERFORMED:  Video bronchoscopy under general anesthesia with placement of a Spiration intrabronchial valves x3 in medial and lateral segments of the middle lobe and superior segment of right lower lobe. SURGEON:  Lanelle Bal, MD.  Cancer Staging Malignant neoplasm of upper lobe of right lung [facility]) Staging form: Lung, AJCC 8th Edition - Pathologic stage from 10/23/2016: Stage IIA (pT2b, pN0, cM0) - Signed by [name], MD on 10/30/2016   CURRENT THERAPY: Observation.   History of Present Illness:    Patient returns for postop visit after right upper lobectomy done for stage II a carcinoma the lung. A follow-up CT scan has recently been done. The patient's postop course was complicated by respiratory failure and persistent air leak and sepsis. Bronchial valves were placed. Ultimately her air leak resolved. She improved in inpatient rehabilitation and ultimately was discharged home.   Patient returns to the office for follow-up visit with a chest x-ray done today. Since last seen she's a can a cruise to [place]. She noted she was without respiratory symptoms. She did avoid very  strenuous activity. She notes she is able to do her usual activities at home just as she was preop.  She notes a monitor was placed for month and reports there was no atrial fib, she is to see cardiology in November to discuss stopping anticoagulation  Since last seen Dr. Sarina Ser  performed upper GI endoscopy- there were no findings    Past Medical History:  Diagnosis Date  . Anxiety    panic attacks when she becomes short of breath- uses nebulizer prior to bedtime  . Arthritis   . Asthma    asthmatic bronchitis  . COPD (chronic obstructive pulmonary disease) ([place])   . DVT (deep venous thrombosis) ([place])   . Dyspnea    with exetrtion  . Dysrhythmia    Afib - while in hospital wwith sepsis  . History of kidney stones   . Hyperlipidemia   . Hypertension   . Pneumonia    years  . PONV (postoperative nausea and vomiting)    none with her hip surgery.  Has decreased concentration and abliity for 2- 3 weeks after anesthesai.  [name] Restless leg syndrome   . 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 Former Smoker  . Packs/day: 0.00  . Years: 45.00  . Quit date: 08/29/2016  Smokeless Tobacco  . Never Used    History  Alcohol Use No     Allergies  Allergen Reactions  . Nickel Rash    Current Outpatient Prescriptions  Medication Sig Dispense Refill  . acetaminophen (TYLENOL) 500 MG tablet Take 1,000 mg by  mouth every 6 (six) hours as needed for mild pain.    [name] ELIQUIS 5 MG TABS tablet     . fluticasone (FLONASE) 50 MCG/ACT nasal spray Place 2 sprays into both nostrils daily.     . furosemide (LASIX) 20 MG tablet Take 20 mg by mouth daily as needed for fluid or edema.     [name] loratadine (CLARITIN) 10 MG tablet Take 10 mg by mouth daily.    . Multiple Vitamin (MULTIVITAMIN) tablet Take 1 tablet by mouth daily.    . sodium chloride (OCEAN) 0.65 % SOLN nasal spray Place 1 spray into both nostrils as needed for congestion.  0  .  telmisartan (MICARDIS) 40 MG tablet daily.    [name] zolpidem (AMBIEN) 10 MG tablet Take 5 mg by mouth at bedtime as needed for sleep.    [name] albuterol (PROVENTIL HFA;VENTOLIN HFA) 108 (90 Base) MCG/ACT inhaler Inhale 1 puff into the lungs every 6 (six) hours as needed for wheezing or shortness of breath.    [name] albuterol (PROVENTIL) (2.5 MG/3ML) 0.083% nebulizer solution Take 2.5 mg by nebulization every 6 (six) hours as needed for wheezing or shortness of breath.    . Tiotropium Bromide-Olodaterol (STIOLTO RESPIMAT) 2.5-2.5 MCG/ACT AERS Inhale 2 puffs into the lungs daily.     No current facility-administered medications for this visit.        Physical Exam: BP 139/81   Pulse 70   Ht 5' 3.5" (1.613 m)   Wt 142 lb (64.4 kg)   SpO2 99%   BMI 24.76 kg/m   General appearance: alert, cooperative, appears older than stated age and no distress Neurologic: intact Heart: regular rate and rhythm, S1, S2 normal, no murmur, click, rub or gallop Lungs: diminished breath sounds bibasilar Abdomen: soft, non-tender; bowel sounds normal; no masses,  no organomegaly Extremities: extremities normal, atraumatic, no cyanosis or edema Wound: Incisions and chest tube sites  are well-healed,    Diagnostic Studies & Laboratory data:     Recent Radiology Findings:  Dg Chest 2 View  Result Date: 07/18/2017 CLINICAL DATA:  Status post lobectomy on right. History of asthma and hypertension. EXAM: CHEST  2 VIEW COMPARISON:  January 09, 2017 chest radiograph and chest CT April 08, 2017 FINDINGS: Postoperative change on the right with volume loss is stable. There is mild scarring in the right base. There is no edema or consolidation. No mass evident by radiography. Heart size is normal. Pulmonary vascularity on the left is normal. There is mild distortion of pulmonary vascularity on the right due to postoperative change. There is no evident adenopathy. There is aortic atherosclerosis. There are no blastic or lytic bone  lesions. IMPRESSION: Volume loss on the right with areas of scarring and postoperative change. No edema or consolidation. Heart size within normal limits. There is aortic atherosclerosis. No evident adenopathy. Aortic Atherosclerosis (ICD10-I70.0). Electronically Signed   By: Lowella Grip III M.D.   On: 07/18/2017 08:59    Ct Chest W Contrast  Result Date: 04/08/2017 CLINICAL DATA:  Status post right upper lobectomy 10/29/2016 for squamous cell lung carcinoma. Restaging. EXAM: CT CHEST WITH CONTRAST TECHNIQUE: Multidetector CT imaging of the chest was performed during intravenous contrast administration. CONTRAST:  44mL ISOVUE-300 IOPAMIDOL (ISOVUE-300) INJECTION 61% COMPARISON:  01/03/2017 chest CT. 01/09/2017 chest radiograph. FINDINGS: Cardiovascular: Normal heart size. No significant pericardial fluid/thickening. Left anterior descending, left circumflex and right coronary atherosclerosis. Atherosclerotic nonaneurysmal thoracic aorta. Normal caliber pulmonary arteries. No central pulmonary emboli. Mediastinum/Nodes: Stable bilateral hypodense  thyroid nodules, largest 1.1 cm in the left thyroid lobe. Unremarkable esophagus. No pathologically enlarged axillary, mediastinal or hilar lymph nodes. Lungs/Pleura: Status post right upper lobectomy. No pneumothorax. No pleural effusion. Moderate centrilobular and paraseptal emphysema with diffuse bronchial wall thickening. Improved aeration in the right middle lobe. Mild patchy residual tree-in-bud type ground-glass opacities in the superior segment right lower lobe, decreased. No acute consolidative airspace disease, lung masses or new significant pulmonary nodules. Upper abdomen: There is persistent masslike wall thickening in the posterior gastric cardia measuring 3.9 x 2.4 cm (series 2/ image 130), which was hypermetabolic on 16/07/9603 PET-CT study. Tiny hiatal hernia. No discrete adrenal nodules. Stable scattered tiny nonobstructing stones and renal  cortical scarring in the visualized upper left kidney. Musculoskeletal: No aggressive appearing focal osseous lesions. Moderate thoracic spondylosis. Healing post thoracotomy changes on the right anterolateral third and fourth ribs. IMPRESSION: 1. Persistent masslike wall thickening in the posterior gastric cardia, which was hypermetabolic as described on 54/06/8118 PET-CT report. Gastric neoplasm cannot be excluded and upper endoscopic correlation is advised if not performed in the interval since the PET-CT study. 2. No evidence of local tumor recurrence in the right lung status post right upper lobectomy. 3. No evidence of metastatic disease in the chest. 4. Mild patchy tree-in-bud type opacities in the superior segment right lower lobe are decreased, favor improved nonspecific mild bronchiolitis. Improved right middle lobe aeration. 5. Three-vessel coronary atherosclerosis. Aortic Atherosclerosis (ICD10-I70.0) and Emphysema (ICD10-J43.9). Electronically Signed   By: Ilona Sorrel M.D.   On: 04/08/2017 10:33     Recent Lab Findings: Lab Results  Component Value Date   WBC 6.3 04/08/2017   HGB 11.4 (L) 04/08/2017   HCT 34.7 (L) 04/08/2017   PLT 272 04/08/2017   GLUCOSE 96 04/08/2017   TRIG 137 11/12/2016   ALT 11 04/08/2017   AST 16 04/08/2017   NA 137 04/08/2017   K 4.2 04/08/2017   CL 98 (L) 12/06/2016   CREATININE 1.4 (H) 04/08/2017   BUN 28.3 (H) 04/08/2017   CO2 24 04/08/2017   INR 1.29 01/08/2017   Chronic Kidney Disease   Stage I     GFR >90  Stage II    GFR 60-89  Stage IIIA GFR 45-59  Stage IIIB GFR 30-44  Stage IV   GFR 15-29  Stage V    GFR  <15  Lab Results  Component Value Date   CREATININE 1.4 (H) 04/08/2017   CrCl cannot be calculated (Patient's most recent lab result is older than the maximum 21 days allowed.).    Assessment / Plan:    Patient stable after right upper lobectomy, follow-up CT scan says no evidence of recurrence. Patient doing well following  right upper lobectomy postop course complicated by , respiratory failure sepsis, , she is now made a good functional recovery and return to normal activities CTS chest is scheduled by oncology for December, I plan to see her back in 6 months   [name] MD      [address] [place] [address] Office [phone]   [place] [phone]  07/18/2017 9:55 AM

## 2017-07-23 DIAGNOSIS — H1045 Other chronic allergic conjunctivitis: Secondary | ICD-10-CM | POA: Diagnosis not present

## 2017-07-23 DIAGNOSIS — H2511 Age-related nuclear cataract, right eye: Secondary | ICD-10-CM | POA: Diagnosis not present

## 2017-07-23 DIAGNOSIS — H26492 Other secondary cataract, left eye: Secondary | ICD-10-CM | POA: Diagnosis not present

## 2017-07-24 DIAGNOSIS — I10 Essential (primary) hypertension: Secondary | ICD-10-CM | POA: Diagnosis not present

## 2017-07-24 DIAGNOSIS — E7849 Other hyperlipidemia: Secondary | ICD-10-CM | POA: Diagnosis not present

## 2017-07-24 DIAGNOSIS — Z0000 Encounter for general adult medical examination without abnormal findings: Secondary | ICD-10-CM | POA: Diagnosis not present

## 2017-07-31 ENCOUNTER — Encounter: Payer: Self-pay | Admitting: Gastroenterology

## 2017-07-31 DIAGNOSIS — Z0000 Encounter for general adult medical examination without abnormal findings: Secondary | ICD-10-CM | POA: Diagnosis not present

## 2017-07-31 DIAGNOSIS — Z23 Encounter for immunization: Secondary | ICD-10-CM | POA: Diagnosis not present

## 2017-07-31 DIAGNOSIS — E7849 Other hyperlipidemia: Secondary | ICD-10-CM | POA: Diagnosis not present

## 2017-07-31 DIAGNOSIS — Z87891 Personal history of nicotine dependence: Secondary | ICD-10-CM | POA: Diagnosis not present

## 2017-07-31 DIAGNOSIS — J449 Chronic obstructive pulmonary disease, unspecified: Secondary | ICD-10-CM | POA: Diagnosis not present

## 2017-07-31 DIAGNOSIS — M5489 Other dorsalgia: Secondary | ICD-10-CM | POA: Diagnosis not present

## 2017-07-31 DIAGNOSIS — R928 Other abnormal and inconclusive findings on diagnostic imaging of breast: Secondary | ICD-10-CM | POA: Diagnosis not present

## 2017-07-31 DIAGNOSIS — Z1389 Encounter for screening for other disorder: Secondary | ICD-10-CM | POA: Diagnosis not present

## 2017-07-31 DIAGNOSIS — I4891 Unspecified atrial fibrillation: Secondary | ICD-10-CM | POA: Diagnosis not present

## 2017-07-31 DIAGNOSIS — C3490 Malignant neoplasm of unspecified part of unspecified bronchus or lung: Secondary | ICD-10-CM | POA: Diagnosis not present

## 2017-07-31 DIAGNOSIS — I10 Essential (primary) hypertension: Secondary | ICD-10-CM | POA: Diagnosis not present

## 2017-07-31 DIAGNOSIS — I8290 Acute embolism and thrombosis of unspecified vein: Secondary | ICD-10-CM | POA: Diagnosis not present

## 2017-07-31 DIAGNOSIS — Z6824 Body mass index (BMI) 24.0-24.9, adult: Secondary | ICD-10-CM | POA: Diagnosis not present

## 2017-08-02 DIAGNOSIS — Z1212 Encounter for screening for malignant neoplasm of rectum: Secondary | ICD-10-CM | POA: Diagnosis not present

## 2017-08-20 DIAGNOSIS — I10 Essential (primary) hypertension: Secondary | ICD-10-CM | POA: Diagnosis not present

## 2017-08-20 DIAGNOSIS — I480 Paroxysmal atrial fibrillation: Secondary | ICD-10-CM | POA: Diagnosis not present

## 2017-08-20 DIAGNOSIS — Z87891 Personal history of nicotine dependence: Secondary | ICD-10-CM | POA: Diagnosis not present

## 2017-08-20 DIAGNOSIS — Z9889 Other specified postprocedural states: Secondary | ICD-10-CM | POA: Diagnosis not present

## 2017-08-23 DIAGNOSIS — H26492 Other secondary cataract, left eye: Secondary | ICD-10-CM | POA: Diagnosis not present

## 2017-08-23 DIAGNOSIS — H25011 Cortical age-related cataract, right eye: Secondary | ICD-10-CM | POA: Diagnosis not present

## 2017-08-23 DIAGNOSIS — H2511 Age-related nuclear cataract, right eye: Secondary | ICD-10-CM | POA: Diagnosis not present

## 2017-08-23 DIAGNOSIS — Z961 Presence of intraocular lens: Secondary | ICD-10-CM | POA: Diagnosis not present

## 2017-09-03 DIAGNOSIS — I739 Peripheral vascular disease, unspecified: Secondary | ICD-10-CM | POA: Diagnosis not present

## 2017-09-09 DIAGNOSIS — Z1231 Encounter for screening mammogram for malignant neoplasm of breast: Secondary | ICD-10-CM | POA: Diagnosis not present

## 2017-10-14 ENCOUNTER — Other Ambulatory Visit (HOSPITAL_BASED_OUTPATIENT_CLINIC_OR_DEPARTMENT_OTHER): Payer: Medicare Other

## 2017-10-14 ENCOUNTER — Ambulatory Visit (HOSPITAL_COMMUNITY)
Admission: RE | Admit: 2017-10-14 | Discharge: 2017-10-14 | Disposition: A | Discharge status: Home / Self Care | Payer: Medicare Other | Source: Ambulatory Visit | Attending: Internal Medicine | Admitting: Internal Medicine

## 2017-10-14 DIAGNOSIS — D638 Anemia in other chronic diseases classified elsewhere: Secondary | ICD-10-CM | POA: Diagnosis not present

## 2017-10-14 DIAGNOSIS — I700 Atherosclerosis of aorta: Secondary | ICD-10-CM | POA: Insufficient documentation

## 2017-10-14 DIAGNOSIS — J439 Emphysema, unspecified: Secondary | ICD-10-CM | POA: Diagnosis not present

## 2017-10-14 DIAGNOSIS — C3411 Malignant neoplasm of upper lobe, right bronchus or lung: Secondary | ICD-10-CM | POA: Insufficient documentation

## 2017-10-14 DIAGNOSIS — Z902 Acquired absence of lung [part of]: Secondary | ICD-10-CM | POA: Insufficient documentation

## 2017-10-14 LAB — CBC WITH DIFFERENTIAL/PLATELET
BASO%: 0.8 % (ref 0.0–2.0)
Basophils Absolute: 0.1 10*3/uL (ref 0.0–0.1)
EOS ABS: 0.2 10*3/uL (ref 0.0–0.5)
EOS%: 2.4 % (ref 0.0–7.0)
HEMATOCRIT: 37.6 % (ref 34.8–46.6)
HGB: 12.5 g/dL (ref 11.6–15.9)
LYMPH%: 21.5 % (ref 14.0–49.7)
MCH: 30.1 pg (ref 25.1–34.0)
MCHC: 33.3 g/dL (ref 31.5–36.0)
MCV: 90.6 fL (ref 79.5–101.0)
MONO#: 0.7 10*3/uL (ref 0.1–0.9)
MONO%: 7.1 % (ref 0.0–14.0)
NEUT%: 68.2 % (ref 38.4–76.8)
NEUTROS ABS: 6.6 10*3/uL — AB (ref 1.5–6.5)
PLATELETS: 213 10*3/uL (ref 145–400)
RBC: 4.15 10*6/uL (ref 3.70–5.45)
RDW: 15.1 % — ABNORMAL HIGH (ref 11.2–14.5)
WBC: 9.7 10*3/uL (ref 3.9–10.3)
lymph#: 2.1 10*3/uL (ref 0.9–3.3)

## 2017-10-14 LAB — COMPREHENSIVE METABOLIC PANEL
ALT: 16 U/L (ref 0–55)
AST: 16 U/L (ref 5–34)
Albumin: 3.8 g/dL (ref 3.5–5.0)
Alkaline Phosphatase: 67 U/L (ref 40–150)
Anion Gap: 8 mEq/L (ref 3–11)
BUN: 22.1 mg/dL (ref 7.0–26.0)
CO2: 27 mEq/L (ref 22–29)
Calcium: 10.2 mg/dL (ref 8.4–10.4)
Chloride: 105 mEq/L (ref 98–109)
Creatinine: 1.1 mg/dL (ref 0.6–1.1)
EGFR: 52 mL/min/{1.73_m2} — ABNORMAL LOW (ref >60)
Glucose: 92 mg/dl (ref 70–140)
Potassium: 4.3 mEq/L (ref 3.5–5.1)
Sodium: 140 mEq/L (ref 136–145)
Total Bilirubin: 0.33 mg/dL (ref 0.20–1.20)
Total Protein: 6.8 g/dL (ref 6.4–8.3)

## 2017-10-16 ENCOUNTER — Telehealth: Payer: Self-pay | Admitting: Internal Medicine

## 2017-10-16 ENCOUNTER — Ambulatory Visit (HOSPITAL_BASED_OUTPATIENT_CLINIC_OR_DEPARTMENT_OTHER): Payer: Medicare Other | Admitting: Internal Medicine

## 2017-10-16 ENCOUNTER — Encounter: Payer: Self-pay | Admitting: Internal Medicine

## 2017-10-16 DIAGNOSIS — J40 Bronchitis, not specified as acute or chronic: Secondary | ICD-10-CM

## 2017-10-16 DIAGNOSIS — C3490 Malignant neoplasm of unspecified part of unspecified bronchus or lung: Secondary | ICD-10-CM

## 2017-10-16 DIAGNOSIS — C3411 Malignant neoplasm of upper lobe, right bronchus or lung: Secondary | ICD-10-CM | POA: Diagnosis not present

## 2017-10-16 MED ORDER — AZITHROMYCIN 250 MG PO TABS: ORAL_TABLET | ORAL | 0 refills | Status: DC

## 2017-10-16 NOTE — Progress Notes (Signed)
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  PRIOR THERAPY: Status post right upper lobectomy with lymph node dissection under the care of Dr. Servando Snare on 10/29/2016.  CURRENT THERAPY: Observation.  INTERVAL HISTORY: [name] 72 y.o. female returns to the clinic today for follow-up visit accompanied by her husband.  The patient is feeling fine today with no specific complaints except for sinus infection and postnasal drainage as well as cough productive of greenish sputum.  She denied having any current chest pain, shortness breath or hemoptysis.  She denied having any weight loss or night sweats.  She has no fever or chills.  She has no nausea, vomiting, diarrhea or constipation.  The patient had a repeat CT scan of the chest performed recently and she is here for evaluation and discussion of her scan results.  MEDICAL HISTORY: Past Medical History:  Diagnosis Date  . Anxiety    panic attacks when she becomes short of breath- uses nebulizer prior to bedtime  . Arthritis   . Asthma    asthmatic bronchitis  . COPD (chronic obstructive pulmonary disease) ([place])   . DVT (deep venous thrombosis) ([place])   . Dyspnea    with exetrtion  . Dysrhythmia    Afib - while in hospital wwith sepsis  . History of kidney stones   . Hyperlipidemia   . Hypertension   . Pneumonia    years  . PONV (postoperative nausea and vomiting)    none with her hip surgery.  Has decreased concentration and abliity for 2- 3 weeks after anesthesai.  [name] Restless leg syndrome   . 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  MEDICATIONS:  Current Outpatient Medications  Medication Sig Dispense Refill  . acetaminophen  (TYLENOL) 500 MG tablet Take 1,000 mg by mouth every 6 (six) hours as needed for mild pain.    [name] albuterol (PROVENTIL HFA;VENTOLIN HFA) 108 (90 Base) MCG/ACT inhaler Inhale 1 puff into the lungs every 6 (six) hours as needed for wheezing or shortness of breath.    [name] albuterol (PROVENTIL) (2.5 MG/3ML) 0.083% nebulizer solution Take 2.5 mg by nebulization every 6 (six) hours as needed for wheezing or shortness of breath.    Arne Cleveland 5 MG TABS tablet     . fluticasone (FLONASE) 50 MCG/ACT nasal spray Place 2 sprays into both nostrils daily.     . furosemide (LASIX) 20 MG tablet Take 20 mg by mouth daily as needed for fluid or edema.     [name] loratadine (CLARITIN) 10 MG tablet Take 10 mg by mouth daily.    . Multiple Vitamin (MULTIVITAMIN) tablet Take 1 tablet by mouth daily.    . sodium chloride (OCEAN) 0.65 % SOLN nasal spray Place 1 spray into both nostrils as needed for congestion.  0  . telmisartan (MICARDIS) 40 MG tablet daily.    . Tiotropium Bromide-Olodaterol (STIOLTO RESPIMAT) 2.5-2.5 MCG/ACT AERS Inhale 2 puffs into the lungs daily.    [name] zolpidem (AMBIEN) 10 MG tablet Take 5 mg by mouth at bedtime as needed for sleep.     No current facility-administered medications for this visit.     SURGICAL HISTORY:  Past Surgical History:  Procedure Laterality Date  .  ABDOMINAL HYSTERECTOMY    . BONE DENSIDEXAY  06/02/2013  . CARDIAC CATHETERIZATION    . COLONOSCOPY  08/05/2015  . CYST EXCISION     from back  . CYSTO    . DENTAL EXAMINATION UNDER ANESTHESIA    . DIAGNOSTIC MAMMOGRAM  07/28/2016  . EYE SURGERY Left    cataract surgery with lens implant  . LYMPH NODE DISSECTION Right 10/29/2016   Procedure: LYMPH NODE DISSECTION;  Surgeon: [name], MD;  Location: [facility];  Service: Thoracic;  Laterality: Right;  . TONSILLECTOMY    . TOTAL HIP ARTHROPLASTY  10/31/2012   Procedure: TOTAL Right HIP ARTHROPLASTY ANTERIOR APPROACH;  Surgeon: Mcarthur Rossetti, MD;  Location: [facility];   Service: Orthopedics;  Laterality: Right;  Right Total Hip Replacement  . VIDEO ASSISTED THORACOSCOPY (VATS)/WEDGE RESECTION Right 10/29/2016   Procedure: RIGHT  VIDEO ASSISTED THORACOSCOPY /RIGHT UPPER LOBE LUNG RESECTION and Right Thoracotomy;  Surgeon: [name], MD;  Location: [facility];  Service: Thoracic;  Laterality: Right;  [name] N/A 10/29/2016   Procedure: VIDEO BRONCHOSCOPY;  Surgeon: [name], MD;  Location: [facility];  Service: Thoracic;  Laterality: N/A;  . VIDEO BRONCHOSCOPY WITH ENDOBRONCHIAL NAVIGATION N/A 08/29/2016   Procedure: VIDEO BRONCHOSCOPY WITH ENDOBRONCHIAL NAVIGATION;  Surgeon: Collene Gobble, MD;  Location: [facility];  Service: Thoracic;  Laterality: N/A;  . VIDEO BRONCHOSCOPY WITH INSERTION OF INTERBRONCHIAL VALVE (IBV) N/A 11/05/2016   Procedure: VIDEO BRONCHOSCOPY WITH INSERTION OF INTERBRONCHIAL VALVE (IBV) times three;  Surgeon: [name], MD;  Location[facility];  Service: Thoracic;  Laterality: N/A;  . VIDEO BRONCHOSCOPY WITH INSERTION OF INTERBRONCHIAL VALVE (IBV) N/A 01/09/2017   Procedure: VIDEO BRONCHOSCOPY WITH Removal OF INTERBRONCHIAL VALVE (IBV) x 3;  Surgeon: [name], MD;  Location: [facility];  Service: Thoracic;  Laterality: N/A;    REVIEW OF SYSTEMS:  Constitutional: negative Eyes: negative Ears, nose, mouth, throat, and face: positive for nasal congestion Respiratory: positive for cough and sputum Cardiovascular: negative Gastrointestinal: negative Genitourinary:negative Integument/breast: negative Hematologic/lymphatic: negative Musculoskeletal:negative Neurological: negative Behavioral/Psych: negative Endocrine: negative Allergic/Immunologic: negative   PHYSICAL EXAMINATION: General appearance: alert, cooperative and no distress Head: Normocephalic, without obvious abnormality, atraumatic Neck: no adenopathy, no JVD, supple, symmetrical, trachea midline and thyroid not enlarged, symmetric, no  tenderness/mass/nodules Lymph nodes: Cervical, supraclavicular, and axillary nodes normal. Resp: clear to auscultation bilaterally Back: symmetric, no curvature. ROM normal. No CVA tenderness. Cardio: regular rate and rhythm, S1, S2 normal, no murmur, click, rub or gallop GI: soft, non-tender; bowel sounds normal; no masses,  no organomegaly Extremities: extremities normal, atraumatic, no cyanosis or edema Neurologic: Alert and oriented X 3, normal strength and tone. Normal symmetric reflexes. Normal coordination and gait  ECOG PERFORMANCE STATUS: 1 - Symptomatic but completely ambulatory  Blood pressure (!) 143/68, pulse 85, temperature 98.1 F (36.7 C), temperature source Oral, resp. rate 16, height 5' 3.5" (1.613 m), weight 142 lb 11.2 oz (64.7 kg), SpO2 96 %.  LABORATORY DATA: Lab Results  Component Value Date   WBC 9.7 10/14/2017   HGB 12.5 10/14/2017   HCT 37.6 10/14/2017   MCV 90.6 10/14/2017   PLT 213 10/14/2017      Chemistry      Component Value Date/Time   NA 140 10/14/2017 0855   K 4.3 10/14/2017 0855   CL 98 (L) 12/06/2016 0404   CO2 27 10/14/2017 0855   BUN 22.1 10/14/2017 0855   CREATININE 1.1 10/14/2017 0855  Component Value Date/Time   CALCIUM 10.2 10/14/2017 0855   ALKPHOS 67 10/14/2017 0855   AST 16 10/14/2017 0855   ALT 16 10/14/2017 0855   BILITOT 0.33 10/14/2017 0855       RADIOGRAPHIC STUDIES: Ct Chest Wo Contrast  Result Date: 10/14/2017 CLINICAL DATA:  Patient with history of right lung cancer. Follow-up evaluation. EXAM: CT CHEST WITHOUT CONTRAST TECHNIQUE: Multidetector CT imaging of the chest was performed following the standard protocol without IV contrast. COMPARISON:  CT chest 04/08/2017. FINDINGS: Cardiovascular: Normal heart size. No pericardial effusion. Thoracic aortic vascular calcifications. Mediastinum/Nodes: No enlarged axillary, mediastinal or hilar lymphadenopathy. Stable small low-attenuation thyroid nodules measuring up to  11 mm. The esophagus is normal in appearance. Lungs/Pleura: Central airways are patent. Centrilobular and paraseptal emphysematous change. Patient status post right upper lobectomy. Stable postsurgical changes. Stable patchy nodular opacities within the superior segment right lower lobe. Tiny scattered pulmonary nodules are grossly unchanged. No pleural effusion or pneumothorax. Upper Abdomen: Focal atrophy superior pole left kidney. No acute process. Musculoskeletal: Thoracic spine degenerative changes. No aggressive or acute appearing osseous lesions. The filling postsurgical changes anterolateral right ribs. IMPRESSION: 1. No evidence for local tumor recurrence within the right lung status post right upper lobectomy. 2. No evidence for metastatic disease in the chest. 3. Aortic Atherosclerosis (ICD10-I70.0) and Emphysema (ICD10-J43.9). Electronically Signed   By: Lovey Newcomer M.D.   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 The patient is currently on observation and she is feeling fine except for the sinus infection and postnasal drainage as well as productive cough. She had repeat CT scan of the chest that showed no evidence for local recurrence or metastatic disease in the chest. I discussed the scan results with the patient and her husband and recommended for her to continue in observation with repeat CT scan of the chest in 6 months. For the sinus infection and bronchitis, I will start the patient on Z-Pak.  She was also advised to call her primary care physician if no improvement in her condition. For hypertension, she was advised to take her blood pressure medication as prescribed and to monitor it closely at home. The patient was also advised to call immediately if she has any concerning  symptoms in the interval. The patient voices understanding of current disease status and treatment options and is in agreement with the current care plan. All questions were answered. The patient knows to call the clinic with any problems, questions or concerns. We can certainly see the patient much sooner if necessary.  Disclaimer: This note was dictated with voice recognition software. Similar sounding words can inadvertently be transcribed and may not be corrected upon review.

## 2017-10-16 NOTE — Telephone Encounter (Signed)
Scheduled appt per 1/2 los - Gave patient AVS and calender per los. [facility] to contact patient with ct schedule.

## 2017-11-05 DIAGNOSIS — Z6824 Body mass index (BMI) 24.0-24.9, adult: Secondary | ICD-10-CM | POA: Diagnosis not present

## 2017-11-05 DIAGNOSIS — J449 Chronic obstructive pulmonary disease, unspecified: Secondary | ICD-10-CM | POA: Diagnosis not present

## 2017-11-05 DIAGNOSIS — J209 Acute bronchitis, unspecified: Secondary | ICD-10-CM | POA: Diagnosis not present

## 2017-11-05 DIAGNOSIS — C3490 Malignant neoplasm of unspecified part of unspecified bronchus or lung: Secondary | ICD-10-CM | POA: Diagnosis not present

## 2017-11-05 DIAGNOSIS — I10 Essential (primary) hypertension: Secondary | ICD-10-CM | POA: Diagnosis not present

## 2017-11-05 DIAGNOSIS — J329 Chronic sinusitis, unspecified: Secondary | ICD-10-CM | POA: Diagnosis not present

## 2017-11-22 ENCOUNTER — Encounter: Payer: Self-pay | Admitting: *Deleted

## 2017-11-22 NOTE — Progress Notes (Signed)
Oncology Nurse Navigator Documentation  Oncology Nurse Navigator Flowsheets 11/22/2017  Navigator Location [facility]  Navigator Encounter Type Telephone;Other/patient called. She is moving to [place].  She needs recommendation of med onc. I told her I would follow up with Dr. Julien Nordmann on Monday. I asked that her appt in July be cancelled, I did. She was thankful for the help.   Telephone Outgoing Call;Incoming Call  Barriers/Navigation Needs Coordination of Care  Interventions Coordination of Care  Coordination of Care Other  Acuity Level 2  Acuity Level 2 Other  Time Spent with Patient 30

## 2017-11-25 ENCOUNTER — Telehealth: Payer: Self-pay | Admitting: *Deleted

## 2017-11-25 NOTE — Telephone Encounter (Signed)
Oncology Nurse Navigator Documentation  Oncology Nurse Navigator Flowsheets 11/25/2017  Navigator Location [facility]  Navigator Encounter Type Telephone/I called Ms. Shetley back today. She is moving to [place] and will need a referral to Med Onc. I updated her that Dr. Julien Nordmann does not know of anyone in [place] to refer to.  I asked that once she gets settled in [place] to call me so I can help with referral to Med Onc. She was thankful for the help and she will call at a later time with an update.   Telephone Outgoing Call  [employer] [employer] Needs Education  Education Other  Interventions Education  Education Method Verbal  Acuity Level 2  Time Spent with Patient 30

## 2017-12-12 ENCOUNTER — Encounter: Payer: Self-pay | Admitting: Cardiothoracic Surgery

## 2017-12-12 ENCOUNTER — Ambulatory Visit (INDEPENDENT_AMBULATORY_CARE_PROVIDER_SITE_OTHER): Payer: Medicare Other | Admitting: Cardiothoracic Surgery

## 2017-12-12 ENCOUNTER — Other Ambulatory Visit: Payer: Self-pay

## 2017-12-12 VITALS — BP 150/89 | HR 84 | Resp 18 | Ht 63.5 in | Wt 140.2 lb

## 2017-12-12 DIAGNOSIS — C3411 Malignant neoplasm of upper lobe, right bronchus or lung: Secondary | ICD-10-CM

## 2017-12-12 DIAGNOSIS — Z902 Acquired absence of lung [part of]: Secondary | ICD-10-CM | POA: Diagnosis not present

## 2017-12-12 NOTE — Progress Notes (Signed)
[name] [address]             [phone]      [name] [facility] Medical Record #[id number] Date of Birth: [birth year]  Referring: Hannah Bears, MD Primary Care: Hannah Bunting, MD  Chief Complaint:   POST OP FOLLOW UP 10/29/2016 OPERATIVE REPORT PREOPERATIVE DIAGNOSIS:  Squamous cell carcinoma, right upper lobe POSTOPERATIVE DIAGNOSIS:  Squamous cell carcinoma, right upper lobe. SURGICAL PROCEDURE:  Video bronchoscopy, right video-assisted thoracoscopy, mini thoracotomy, right upper lobectomy with lymph node dissection, and placement of On-Q. SURGEON:  [name], M.D.  11/05/2016  OPERATIVE REPORT PREOPERATIVE DIAGNOSIS:  Persistent air leak after right upper lobectomy for non-small cell carcinoma of the lung. PROCEDURE PERFORMED:  Video bronchoscopy under general anesthesia with placement of a Spiration intrabronchial valves x3 in medial and lateral segments of the middle lobe and superior segment of right lower lobe. SURGEON:  Hannah Bal, MD.  Cancer Staging Malignant neoplasm of upper lobe of right lung [facility]) Staging form: Lung, AJCC 8th Edition - Pathologic stage from 10/23/2016: Stage IIA (pT2b, pN0, cM0) - Signed by Hannah Isaac, MD on 10/30/2016   CURRENT THERAPY: Observation.   History of Present Illness:    Patient returns for postop visit after right upper lobectomy done for stage II a carcinoma the lung. A follow-up CT scan has recently been done. The patient's postop course was complicated by respiratory failure and persistent air leak and sepsis. Bronchial valves were placed. Ultimately her air leak resolved. She improved in inpatient rehabilitation and ultimately was discharged home.   Since last seen the patient continues to do well, she does complain of some dry cough no hemoptysis.  CT scan of the chest done in late December shows no evidence of recurrence  Patient plans to move to  [facility] in the next month.   Past Medical History:  Diagnosis Date  . Anxiety    panic attacks when she becomes short of breath- uses nebulizer prior to bedtime  . Arthritis   . Asthma    asthmatic bronchitis  . COPD (chronic obstructive pulmonary disease) ([place])   . DVT (deep venous thrombosis) ([place])   . Dyspnea    with exetrtion  . Dysrhythmia    Afib - while in hospital wwith sepsis  . History of kidney stones   . Hyperlipidemia   . Hypertension   . Pneumonia    years  . PONV (postoperative nausea and vomiting)    none with her hip surgery.  Has decreased concentration and abliity for 2- 3 weeks after anesthesai.  [name] Restless leg syndrome   . 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 Packs/day: 0.00  . Years: 45.00  . Pack years: 0.00  . Last attempt to quit: 08/29/2016  . Years since quitting: 1.2  Smokeless Tobacco Never Used    Social History   Substance and Sexual Activity  Alcohol Use No     Allergies  Allergen Reactions  . Nickel Rash    Current Outpatient Medications  Medication Sig Dispense Refill  . acetaminophen (TYLENOL) 500 MG tablet Take 1,000 mg by mouth every 6 (six) hours as needed for mild pain.    [name] albuterol (PROVENTIL HFA;VENTOLIN HFA) 108 (90 Base) MCG/ACT inhaler Inhale 1 puff into the lungs every 6 (  six) hours as needed for wheezing or shortness of breath.    [name] albuterol (PROVENTIL) (2.5 MG/3ML) 0.083% nebulizer solution Take 2.5 mg by nebulization every 6 (six) hours as needed for wheezing or shortness of breath.    . fluticasone (FLONASE) 50 MCG/ACT nasal spray Place 2 sprays into both nostrils daily.     . furosemide (LASIX) 20 MG tablet Take 20 mg by mouth daily as needed for fluid or edema.     [name] loratadine (CLARITIN) 10 MG tablet Take 10 mg by mouth daily.    . Multiple Vitamin (MULTIVITAMIN) tablet Take 1 tablet by mouth daily.    .  rosuvastatin (CRESTOR) 20 MG tablet Take 20 mg by mouth daily.    . sodium chloride (OCEAN) 0.65 % SOLN nasal spray Place 1 spray into both nostrils as needed for congestion.  0  . telmisartan (MICARDIS) 40 MG tablet daily.    . Tiotropium Bromide-Olodaterol (STIOLTO RESPIMAT) 2.5-2.5 MCG/ACT AERS Inhale 2 puffs into the lungs daily.    [name] zolpidem (AMBIEN) 10 MG tablet Take 5 mg by mouth at bedtime as needed for sleep.     No current facility-administered medications for this visit.     Review of Systems  Constitutional: Negative for chills, diaphoresis, fever, malaise/fatigue and weight loss.  HENT: Negative.   Eyes: Negative.   Respiratory: Positive for cough. Negative for hemoptysis, sputum production, shortness of breath and wheezing.   Cardiovascular: Negative.   Gastrointestinal: Negative.   Genitourinary: Negative.   Musculoskeletal: Negative.   Skin: Negative.   Neurological: Negative.  Negative for weakness.  Endo/Heme/Allergies: Negative.   Psychiatric/Behavioral: Negative.       Physical Exam: BP (!) 150/89 (BP Location: Left Arm, Patient Position: Sitting, Cuff Size: Large)   Pulse 84   Resp 18   Ht 5' 3.5" (1.613 m)   Wt 140 lb 3.2 oz (63.6 kg)   SpO2 97% Comment: RA  BMI 24.45 kg/m  General appearance: alert, cooperative, appears stated age and no distress Head: Normocephalic, without obvious abnormality, atraumatic Neck: no adenopathy, no carotid bruit, no JVD, supple, symmetrical, trachea midline and thyroid not enlarged, symmetric, no tenderness/mass/nodules Lymph nodes: Cervical, supraclavicular, and axillary nodes normal. Resp: clear to auscultation bilaterally Back: symmetric, no curvature. ROM normal. No CVA tenderness. Cardio: regular rate and rhythm, S1, S2 normal, no murmur, click, rub or gallop GI: soft, non-tender; bowel sounds normal; no masses,  no organomegaly Extremities: extremities normal, atraumatic, no cyanosis or edema and Homans sign is  negative, no sign of DVT Neurologic: Grossly normal  Diagnostic Studies & Laboratory data:     Recent Radiology Findings:  CLINICAL DATA:  Patient with history of right lung cancer. Follow-up evaluation.  EXAM: CT CHEST WITHOUT CONTRAST  TECHNIQUE: Multidetector CT imaging of the chest was performed following the standard protocol without IV contrast.  COMPARISON:  CT chest 04/08/2017.  FINDINGS: Cardiovascular: Normal heart size. No pericardial effusion. Thoracic aortic vascular calcifications.  Mediastinum/Nodes: No enlarged axillary, mediastinal or hilar lymphadenopathy. Stable small low-attenuation thyroid nodules measuring up to 11 mm. The esophagus is normal in appearance.  Lungs/Pleura: Central airways are patent. Centrilobular and paraseptal emphysematous change. Patient status post right upper lobectomy. Stable postsurgical changes. Stable patchy nodular opacities within the superior segment right lower lobe. Tiny scattered pulmonary nodules are grossly unchanged. No pleural effusion or pneumothorax.  Upper Abdomen: Focal atrophy superior pole left kidney. No acute process.  Musculoskeletal: Thoracic spine degenerative changes. No aggressive or acute appearing osseous lesions.  The filling postsurgical changes anterolateral right ribs.  IMPRESSION: 1. No evidence for local tumor recurrence within the right lung status post right upper lobectomy. 2. No evidence for metastatic disease in the chest. 3. Aortic Atherosclerosis (ICD10-I70.0) and Emphysema (ICD10-J43.9).   Electronically Signed   By: Lovey Newcomer M.D.   On: 10/14/2017 10:17 I have independently reviewed the above radiology studies  and reviewed the findings with the patient.   Ct Chest W Contrast  Result Date: 04/08/2017 CLINICAL DATA:  Status post right upper lobectomy 10/29/2016 for squamous cell lung carcinoma. Restaging. EXAM: CT CHEST WITH CONTRAST TECHNIQUE: Multidetector CT  imaging of the chest was performed during intravenous contrast administration. CONTRAST:  90mL ISOVUE-300 IOPAMIDOL (ISOVUE-300) INJECTION 61% COMPARISON:  01/03/2017 chest CT. 01/09/2017 chest radiograph. FINDINGS: Cardiovascular: Normal heart size. No significant pericardial fluid/thickening. Left anterior descending, left circumflex and right coronary atherosclerosis. Atherosclerotic nonaneurysmal thoracic aorta. Normal caliber pulmonary arteries. No central pulmonary emboli. Mediastinum/Nodes: Stable bilateral hypodense thyroid nodules, largest 1.1 cm in the left thyroid lobe. Unremarkable esophagus. No pathologically enlarged axillary, mediastinal or hilar lymph nodes. Lungs/Pleura: Status post right upper lobectomy. No pneumothorax. No pleural effusion. Moderate centrilobular and paraseptal emphysema with diffuse bronchial wall thickening. Improved aeration in the right middle lobe. Mild patchy residual tree-in-bud type ground-glass opacities in the superior segment right lower lobe, decreased. No acute consolidative airspace disease, lung masses or new significant pulmonary nodules. Upper abdomen: There is persistent masslike wall thickening in the posterior gastric cardia measuring 3.9 x 2.4 cm (series 2/ image 130), which was hypermetabolic on 92/33/0076 PET-CT study. Tiny hiatal hernia. No discrete adrenal nodules. Stable scattered tiny nonobstructing stones and renal cortical scarring in the visualized upper left kidney. Musculoskeletal: No aggressive appearing focal osseous lesions. Moderate thoracic spondylosis. Healing post thoracotomy changes on the right anterolateral third and fourth ribs. IMPRESSION: 1. Persistent masslike wall thickening in the posterior gastric cardia, which was hypermetabolic as described on 22/63/3354 PET-CT report. Gastric neoplasm cannot be excluded and upper endoscopic correlation is advised if not performed in the interval since the PET-CT study. 2. No evidence of local  tumor recurrence in the right lung status post right upper lobectomy. 3. No evidence of metastatic disease in the chest. 4. Mild patchy tree-in-bud type opacities in the superior segment right lower lobe are decreased, favor improved nonspecific mild bronchiolitis. Improved right middle lobe aeration. 5. Three-vessel coronary atherosclerosis. Aortic Atherosclerosis (ICD10-I70.0) and Emphysema (ICD10-J43.9). Electronically Signed   By: Ilona Sorrel M.D.   On: 04/08/2017 10:33     Recent Lab Findings: Lab Results  Component Value Date   WBC 9.7 10/14/2017   HGB 12.5 10/14/2017   HCT 37.6 10/14/2017   PLT 213 10/14/2017   GLUCOSE 92 10/14/2017   TRIG 137 11/12/2016   ALT 16 10/14/2017   AST 16 10/14/2017   NA 140 10/14/2017   K 4.3 10/14/2017   CL 98 (L) 12/06/2016   CREATININE 1.1 10/14/2017   BUN 22.1 10/14/2017   CO2 27 10/14/2017   INR 1.29 01/08/2017      Assessment / Plan:   Patient doing well without evidence of recurrence after resection of a stage II a non-small cell carcinoma of the lung just over 1 year ago. The patient plans to move to [facility] in the next month.  I suggested to her obtain a copy of her CT scan of the chest the most recent one to take whether for comparison sake She was encouraged to see a primary  care doctor and oncologist before follow-up in [place].     Hannah Isaac MD      [address] [place] [address] Office [phone]   Beeper [phone]  12/12/2017 9:52 AM

## 2018-01-23 ENCOUNTER — Encounter: Payer: Medicare Other | Admitting: Cardiothoracic Surgery

## 2018-03-21 ENCOUNTER — Telehealth: Payer: Self-pay | Admitting: *Deleted

## 2018-03-21 NOTE — Telephone Encounter (Signed)
Faxed [facility] to attn: Tacey Heap; release [phone]

## 2018-04-03 DIAGNOSIS — C3411 Malignant neoplasm of upper lobe, right bronchus or lung: Secondary | ICD-10-CM | POA: Diagnosis not present

## 2018-04-03 DIAGNOSIS — Z87891 Personal history of nicotine dependence: Secondary | ICD-10-CM | POA: Diagnosis not present

## 2018-04-14 ENCOUNTER — Other Ambulatory Visit: Payer: Medicare Other

## 2018-04-16 ENCOUNTER — Ambulatory Visit: Payer: Medicare Other | Admitting: Internal Medicine

## 2018-04-28 DIAGNOSIS — J439 Emphysema, unspecified: Secondary | ICD-10-CM | POA: Diagnosis not present

## 2018-04-28 DIAGNOSIS — J984 Other disorders of lung: Secondary | ICD-10-CM | POA: Diagnosis not present

## 2018-04-28 DIAGNOSIS — C3411 Malignant neoplasm of upper lobe, right bronchus or lung: Secondary | ICD-10-CM | POA: Diagnosis not present

## 2018-04-28 DIAGNOSIS — N281 Cyst of kidney, acquired: Secondary | ICD-10-CM | POA: Diagnosis not present

## 2018-04-28 DIAGNOSIS — N200 Calculus of kidney: Secondary | ICD-10-CM | POA: Diagnosis not present

## 2018-04-28 DIAGNOSIS — I2510 Atherosclerotic heart disease of native coronary artery without angina pectoris: Secondary | ICD-10-CM | POA: Diagnosis not present

## 2018-04-28 DIAGNOSIS — E042 Nontoxic multinodular goiter: Secondary | ICD-10-CM | POA: Diagnosis not present

## 2018-04-28 DIAGNOSIS — K449 Diaphragmatic hernia without obstruction or gangrene: Secondary | ICD-10-CM | POA: Diagnosis not present

## 2018-05-23 DIAGNOSIS — C3491 Malignant neoplasm of unspecified part of right bronchus or lung: Secondary | ICD-10-CM | POA: Diagnosis not present

## 2018-05-23 DIAGNOSIS — K210 Gastro-esophageal reflux disease with esophagitis: Secondary | ICD-10-CM | POA: Diagnosis not present

## 2018-06-02 DIAGNOSIS — Z23 Encounter for immunization: Secondary | ICD-10-CM | POA: Diagnosis not present

## 2018-06-02 DIAGNOSIS — Z780 Asymptomatic menopausal state: Secondary | ICD-10-CM | POA: Diagnosis not present

## 2018-06-02 DIAGNOSIS — Z0000 Encounter for general adult medical examination without abnormal findings: Secondary | ICD-10-CM | POA: Diagnosis not present

## 2018-08-01 DIAGNOSIS — Z23 Encounter for immunization: Secondary | ICD-10-CM | POA: Diagnosis not present

## 2018-08-03 DIAGNOSIS — R0902 Hypoxemia: Secondary | ICD-10-CM | POA: Diagnosis not present

## 2018-08-03 DIAGNOSIS — J440 Chronic obstructive pulmonary disease with acute lower respiratory infection: Secondary | ICD-10-CM | POA: Diagnosis not present

## 2018-08-03 DIAGNOSIS — J9601 Acute respiratory failure with hypoxia: Secondary | ICD-10-CM | POA: Diagnosis present

## 2018-08-03 DIAGNOSIS — J156 Pneumonia due to other aerobic Gram-negative bacteria: Secondary | ICD-10-CM | POA: Diagnosis present

## 2018-08-03 DIAGNOSIS — Z7982 Long term (current) use of aspirin: Secondary | ICD-10-CM | POA: Diagnosis not present

## 2018-08-03 DIAGNOSIS — R0602 Shortness of breath: Secondary | ICD-10-CM | POA: Diagnosis not present

## 2018-08-03 DIAGNOSIS — Z85118 Personal history of other malignant neoplasm of bronchus and lung: Secondary | ICD-10-CM | POA: Diagnosis not present

## 2018-08-03 DIAGNOSIS — R1110 Vomiting, unspecified: Secondary | ICD-10-CM | POA: Diagnosis not present

## 2018-08-03 DIAGNOSIS — J449 Chronic obstructive pulmonary disease, unspecified: Secondary | ICD-10-CM | POA: Diagnosis not present

## 2018-08-03 DIAGNOSIS — J181 Lobar pneumonia, unspecified organism: Secondary | ICD-10-CM | POA: Diagnosis not present

## 2018-08-03 DIAGNOSIS — E785 Hyperlipidemia, unspecified: Secondary | ICD-10-CM | POA: Diagnosis not present

## 2018-08-03 DIAGNOSIS — I10 Essential (primary) hypertension: Secondary | ICD-10-CM | POA: Diagnosis present

## 2018-08-03 DIAGNOSIS — Z87891 Personal history of nicotine dependence: Secondary | ICD-10-CM | POA: Diagnosis not present

## 2018-08-19 DIAGNOSIS — R5382 Chronic fatigue, unspecified: Secondary | ICD-10-CM | POA: Diagnosis not present

## 2018-08-19 DIAGNOSIS — F5101 Primary insomnia: Secondary | ICD-10-CM | POA: Diagnosis not present

## 2018-08-19 DIAGNOSIS — Z780 Asymptomatic menopausal state: Secondary | ICD-10-CM | POA: Diagnosis not present

## 2018-08-19 DIAGNOSIS — Z79899 Other long term (current) drug therapy: Secondary | ICD-10-CM | POA: Diagnosis not present

## 2018-08-19 DIAGNOSIS — Z87891 Personal history of nicotine dependence: Secondary | ICD-10-CM | POA: Diagnosis not present

## 2018-08-19 DIAGNOSIS — Z0000 Encounter for general adult medical examination without abnormal findings: Secondary | ICD-10-CM | POA: Diagnosis not present

## 2018-08-19 DIAGNOSIS — Z130 Encounter for screening for diseases of the blood and blood-forming organs and certain disorders involving the immune mechanism: Secondary | ICD-10-CM | POA: Diagnosis not present

## 2018-08-19 DIAGNOSIS — Z13820 Encounter for screening for osteoporosis: Secondary | ICD-10-CM | POA: Diagnosis not present

## 2018-08-19 DIAGNOSIS — Z1231 Encounter for screening mammogram for malignant neoplasm of breast: Secondary | ICD-10-CM | POA: Diagnosis not present

## 2018-08-19 DIAGNOSIS — J181 Lobar pneumonia, unspecified organism: Secondary | ICD-10-CM | POA: Diagnosis not present

## 2018-08-19 DIAGNOSIS — Z13220 Encounter for screening for lipoid disorders: Secondary | ICD-10-CM | POA: Diagnosis not present

## 2018-08-19 DIAGNOSIS — C3491 Malignant neoplasm of unspecified part of right bronchus or lung: Secondary | ICD-10-CM | POA: Diagnosis not present

## 2018-08-19 DIAGNOSIS — Z1211 Encounter for screening for malignant neoplasm of colon: Secondary | ICD-10-CM | POA: Diagnosis not present

## 2018-09-25 DIAGNOSIS — Z87891 Personal history of nicotine dependence: Secondary | ICD-10-CM | POA: Diagnosis not present

## 2018-09-25 DIAGNOSIS — C3411 Malignant neoplasm of upper lobe, right bronchus or lung: Secondary | ICD-10-CM | POA: Diagnosis not present

## 2018-10-03 DIAGNOSIS — J432 Centrilobular emphysema: Secondary | ICD-10-CM | POA: Diagnosis not present

## 2018-10-03 DIAGNOSIS — C3491 Malignant neoplasm of unspecified part of right bronchus or lung: Secondary | ICD-10-CM | POA: Diagnosis not present

## 2018-10-03 DIAGNOSIS — Z9889 Other specified postprocedural states: Secondary | ICD-10-CM | POA: Diagnosis not present

## 2018-10-03 DIAGNOSIS — J439 Emphysema, unspecified: Secondary | ICD-10-CM | POA: Diagnosis not present

## 2018-10-06 DIAGNOSIS — M85832 Other specified disorders of bone density and structure, left forearm: Secondary | ICD-10-CM | POA: Diagnosis not present

## 2018-10-06 DIAGNOSIS — Z780 Asymptomatic menopausal state: Secondary | ICD-10-CM | POA: Diagnosis not present

## 2018-10-06 DIAGNOSIS — Z13820 Encounter for screening for osteoporosis: Secondary | ICD-10-CM | POA: Diagnosis not present

## 2018-10-06 DIAGNOSIS — Z1231 Encounter for screening mammogram for malignant neoplasm of breast: Secondary | ICD-10-CM | POA: Diagnosis not present

## 2018-10-21 DIAGNOSIS — R05 Cough: Secondary | ICD-10-CM | POA: Diagnosis not present

## 2018-10-21 DIAGNOSIS — J301 Allergic rhinitis due to pollen: Secondary | ICD-10-CM | POA: Diagnosis not present

## 2018-10-21 DIAGNOSIS — J449 Chronic obstructive pulmonary disease, unspecified: Secondary | ICD-10-CM | POA: Diagnosis not present

## 2018-10-29 DIAGNOSIS — R05 Cough: Secondary | ICD-10-CM | POA: Diagnosis not present

## 2018-10-29 DIAGNOSIS — J301 Allergic rhinitis due to pollen: Secondary | ICD-10-CM | POA: Diagnosis not present

## 2018-11-25 DIAGNOSIS — J449 Chronic obstructive pulmonary disease, unspecified: Secondary | ICD-10-CM | POA: Diagnosis not present

## 2018-11-25 DIAGNOSIS — C3491 Malignant neoplasm of unspecified part of right bronchus or lung: Secondary | ICD-10-CM | POA: Diagnosis not present

## 2018-11-25 DIAGNOSIS — Z87891 Personal history of nicotine dependence: Secondary | ICD-10-CM | POA: Diagnosis not present

## 2018-12-04 DIAGNOSIS — J449 Chronic obstructive pulmonary disease, unspecified: Secondary | ICD-10-CM | POA: Diagnosis not present
